# Patient Record
Sex: Female | Born: 1965
Health system: Southern US, Community
[De-identification: ages and names within clinical notes are randomized; demographics above are authoritative.]

## PROBLEM LIST (undated history)

## (undated) DIAGNOSIS — I10 Essential (primary) hypertension: Secondary | ICD-10-CM

## (undated) DIAGNOSIS — Z720 Tobacco use: Secondary | ICD-10-CM

## (undated) DIAGNOSIS — E0500 Thyrotoxicosis with diffuse goiter without thyrotoxic crisis or storm: Secondary | ICD-10-CM

## (undated) DIAGNOSIS — F1410 Cocaine abuse, uncomplicated: Secondary | ICD-10-CM

## (undated) DIAGNOSIS — E0590 Thyrotoxicosis, unspecified without thyrotoxic crisis or storm: Secondary | ICD-10-CM

## (undated) DIAGNOSIS — B86 Scabies: Secondary | ICD-10-CM

## (undated) DIAGNOSIS — E079 Disorder of thyroid, unspecified: Secondary | ICD-10-CM

## (undated) HISTORY — DX: Thyrotoxicosis, unspecified without thyrotoxic crisis or storm: E05.90

## (undated) HISTORY — DX: Tobacco use: Z72.0

## (undated) HISTORY — PX: OTHER SURGICAL HISTORY: SHX169

## (undated) HISTORY — DX: Scabies: B86

## (undated) HISTORY — DX: Cocaine abuse, uncomplicated: F14.10

## (undated) HISTORY — DX: Thyrotoxicosis with diffuse goiter without thyrotoxic crisis or storm: E05.00

## (undated) HISTORY — DX: Essential (primary) hypertension: I10

## (undated) HISTORY — PX: MOUTH SURGERY: SHX715

---

## 2003-12-23 ENCOUNTER — Emergency Department (HOSPITAL_COMMUNITY): Admission: EM | Admit: 2003-12-23 | Discharge: 2003-12-23 | Payer: Self-pay | Admitting: Emergency Medicine

## 2004-01-16 ENCOUNTER — Emergency Department (HOSPITAL_COMMUNITY): Admission: EM | Admit: 2004-01-16 | Discharge: 2004-01-16 | Payer: Self-pay | Admitting: Family Medicine

## 2004-01-30 ENCOUNTER — Emergency Department (HOSPITAL_COMMUNITY): Admission: EM | Admit: 2004-01-30 | Discharge: 2004-01-30 | Payer: Self-pay | Admitting: Family Medicine

## 2004-03-06 ENCOUNTER — Emergency Department (HOSPITAL_COMMUNITY): Admission: EM | Admit: 2004-03-06 | Discharge: 2004-03-06 | Payer: Self-pay | Admitting: Family Medicine

## 2005-02-07 ENCOUNTER — Emergency Department (HOSPITAL_COMMUNITY): Admission: EM | Admit: 2005-02-07 | Discharge: 2005-02-07 | Payer: Self-pay | Admitting: Family Medicine

## 2005-05-19 ENCOUNTER — Emergency Department (HOSPITAL_COMMUNITY): Admission: EM | Admit: 2005-05-19 | Discharge: 2005-05-19 | Payer: Self-pay | Admitting: Emergency Medicine

## 2005-05-21 ENCOUNTER — Ambulatory Visit: Payer: Self-pay | Admitting: Internal Medicine

## 2005-05-21 ENCOUNTER — Observation Stay (HOSPITAL_COMMUNITY): Admission: EM | Admit: 2005-05-21 | Discharge: 2005-05-23 | Payer: Self-pay | Admitting: *Deleted

## 2005-05-28 ENCOUNTER — Ambulatory Visit: Payer: Self-pay | Admitting: Internal Medicine

## 2005-07-04 ENCOUNTER — Emergency Department (HOSPITAL_COMMUNITY): Admission: EM | Admit: 2005-07-04 | Discharge: 2005-07-05 | Payer: Self-pay | Admitting: Emergency Medicine

## 2006-10-14 ENCOUNTER — Emergency Department (HOSPITAL_COMMUNITY): Admission: EM | Admit: 2006-10-14 | Discharge: 2006-10-14 | Payer: Self-pay | Admitting: Emergency Medicine

## 2006-10-27 ENCOUNTER — Emergency Department (HOSPITAL_COMMUNITY): Admission: EM | Admit: 2006-10-27 | Discharge: 2006-10-27 | Payer: Self-pay | Admitting: Emergency Medicine

## 2006-12-16 ENCOUNTER — Emergency Department (HOSPITAL_COMMUNITY): Admission: EM | Admit: 2006-12-16 | Discharge: 2006-12-16 | Payer: Self-pay | Admitting: Emergency Medicine

## 2006-12-18 ENCOUNTER — Ambulatory Visit: Payer: Self-pay | Admitting: Internal Medicine

## 2006-12-18 ENCOUNTER — Encounter (INDEPENDENT_AMBULATORY_CARE_PROVIDER_SITE_OTHER): Payer: Self-pay | Admitting: Internal Medicine

## 2006-12-18 DIAGNOSIS — E0500 Thyrotoxicosis with diffuse goiter without thyrotoxic crisis or storm: Secondary | ICD-10-CM | POA: Insufficient documentation

## 2006-12-19 LAB — CONVERTED CEMR LAB
BUN: 10 mg/dL (ref 6–23)
CO2: 27 meq/L (ref 19–32)
Calcium: 8.9 mg/dL (ref 8.4–10.5)
Chloride: 103 meq/L (ref 96–112)
Creatinine, Ser: 0.43 mg/dL (ref 0.40–1.20)
Free T4: 4.25 ng/dL — ABNORMAL HIGH (ref 0.89–1.80)
Glucose, Bld: 66 mg/dL — ABNORMAL LOW (ref 70–99)
HCT: 38.6 % (ref 36.0–46.0)
Hemoglobin: 12.4 g/dL (ref 12.0–15.0)
MCV: 74.4 fL — ABNORMAL LOW (ref 78.0–100.0)
Platelets: 217 10*3/uL (ref 150–400)
RBC: 5.19 M/uL — ABNORMAL HIGH (ref 3.87–5.11)
TSH: 0.018 microintl units/mL — ABNORMAL LOW (ref 0.350–5.50)
Total Bilirubin: 0.8 mg/dL (ref 0.3–1.2)
WBC: 5 10*3/uL (ref 4.0–10.5)

## 2006-12-29 ENCOUNTER — Emergency Department (HOSPITAL_COMMUNITY): Admission: EM | Admit: 2006-12-29 | Discharge: 2006-12-29 | Payer: Self-pay | Admitting: Family Medicine

## 2007-01-15 ENCOUNTER — Emergency Department (HOSPITAL_COMMUNITY): Admission: EM | Admit: 2007-01-15 | Discharge: 2007-01-15 | Payer: Self-pay | Admitting: Emergency Medicine

## 2007-04-06 ENCOUNTER — Encounter (INDEPENDENT_AMBULATORY_CARE_PROVIDER_SITE_OTHER): Payer: Self-pay | Admitting: *Deleted

## 2007-04-06 ENCOUNTER — Emergency Department (HOSPITAL_COMMUNITY): Admission: EM | Admit: 2007-04-06 | Discharge: 2007-04-06 | Payer: Self-pay | Admitting: *Deleted

## 2007-04-06 ENCOUNTER — Ambulatory Visit: Payer: Self-pay | Admitting: Internal Medicine

## 2007-04-06 LAB — CONVERTED CEMR LAB
Free T4: 3.28 ng/dL — ABNORMAL HIGH (ref 0.89–1.80)
TSH: <0.004 microintl units/mL — ABNORMAL LOW (ref 0.350–5.50)

## 2007-06-09 ENCOUNTER — Emergency Department (HOSPITAL_COMMUNITY): Admission: EM | Admit: 2007-06-09 | Discharge: 2007-06-09 | Payer: Self-pay | Admitting: Family Medicine

## 2007-06-29 ENCOUNTER — Emergency Department (HOSPITAL_COMMUNITY): Admission: EM | Admit: 2007-06-29 | Discharge: 2007-06-29 | Payer: Self-pay | Admitting: Family Medicine

## 2007-07-14 ENCOUNTER — Emergency Department (HOSPITAL_COMMUNITY): Admission: EM | Admit: 2007-07-14 | Discharge: 2007-07-14 | Payer: Self-pay | Admitting: Emergency Medicine

## 2007-07-27 ENCOUNTER — Ambulatory Visit: Payer: Self-pay | Admitting: *Deleted

## 2007-07-27 ENCOUNTER — Encounter: Admission: RE | Admit: 2007-07-27 | Discharge: 2007-07-27 | Payer: Self-pay | Admitting: Infectious Disease

## 2007-07-27 ENCOUNTER — Inpatient Hospital Stay (HOSPITAL_COMMUNITY): Admission: AD | Admit: 2007-07-27 | Discharge: 2007-07-29 | Payer: Self-pay | Admitting: *Deleted

## 2007-07-27 ENCOUNTER — Ambulatory Visit: Payer: Self-pay | Admitting: Hospitalist

## 2007-07-27 DIAGNOSIS — M25519 Pain in unspecified shoulder: Secondary | ICD-10-CM

## 2007-07-28 ENCOUNTER — Encounter (INDEPENDENT_AMBULATORY_CARE_PROVIDER_SITE_OTHER): Payer: Self-pay | Admitting: *Deleted

## 2007-07-29 ENCOUNTER — Encounter: Payer: Self-pay | Admitting: Internal Medicine

## 2007-08-17 ENCOUNTER — Ambulatory Visit: Payer: Self-pay | Admitting: *Deleted

## 2007-08-17 ENCOUNTER — Encounter (INDEPENDENT_AMBULATORY_CARE_PROVIDER_SITE_OTHER): Payer: Self-pay | Admitting: *Deleted

## 2007-08-17 DIAGNOSIS — I10 Essential (primary) hypertension: Secondary | ICD-10-CM | POA: Insufficient documentation

## 2007-08-17 DIAGNOSIS — R079 Chest pain, unspecified: Secondary | ICD-10-CM

## 2007-08-17 DIAGNOSIS — K047 Periapical abscess without sinus: Secondary | ICD-10-CM

## 2007-08-17 DIAGNOSIS — F1410 Cocaine abuse, uncomplicated: Secondary | ICD-10-CM | POA: Insufficient documentation

## 2007-10-12 ENCOUNTER — Encounter: Payer: Self-pay | Admitting: Internal Medicine

## 2007-10-29 ENCOUNTER — Ambulatory Visit: Payer: Self-pay | Admitting: Internal Medicine

## 2007-10-29 ENCOUNTER — Encounter: Payer: Self-pay | Admitting: Internal Medicine

## 2007-10-30 LAB — CONVERTED CEMR LAB
CO2: 22 meq/L (ref 19–32)
Calcium: 9 mg/dL (ref 8.4–10.5)
Creatinine, Ser: 0.55 mg/dL (ref 0.40–1.20)
Sodium: 137 meq/L (ref 135–145)
TSH: 0.006 microintl units/mL — ABNORMAL LOW (ref 0.350–4.50)

## 2007-11-10 ENCOUNTER — Emergency Department (HOSPITAL_COMMUNITY): Admission: EM | Admit: 2007-11-10 | Discharge: 2007-11-10 | Payer: Self-pay | Admitting: Emergency Medicine

## 2007-11-12 ENCOUNTER — Telehealth: Payer: Self-pay | Admitting: Infectious Diseases

## 2008-01-13 ENCOUNTER — Telehealth: Payer: Self-pay | Admitting: Internal Medicine

## 2008-03-11 ENCOUNTER — Ambulatory Visit: Payer: Self-pay | Admitting: Internal Medicine

## 2008-03-11 ENCOUNTER — Encounter: Payer: Self-pay | Admitting: Internal Medicine

## 2008-03-11 ENCOUNTER — Encounter (INDEPENDENT_AMBULATORY_CARE_PROVIDER_SITE_OTHER): Payer: Self-pay | Admitting: Internal Medicine

## 2008-03-11 LAB — CONVERTED CEMR LAB
GC Probe Amp, Genital: NEGATIVE
Gardnerella vaginalis: POSITIVE — AB
Trichomonal Vaginitis: POSITIVE — AB

## 2008-03-12 ENCOUNTER — Encounter: Payer: Self-pay | Admitting: Internal Medicine

## 2008-03-12 LAB — CONVERTED CEMR LAB
ALT: 11 units/L (ref 0–35)
AST: 13 units/L (ref 0–37)
Basophils Absolute: 0 10*3/uL (ref 0.0–0.1)
Basophils Relative: 1 % (ref 0–1)
Chloride: 102 meq/L (ref 96–112)
Creatinine, Ser: 0.62 mg/dL (ref 0.40–1.20)
Eosinophils Relative: 2 % (ref 0–5)
Hemoglobin: 11.8 g/dL — ABNORMAL LOW (ref 12.0–15.0)
Lymphocytes Relative: 27 % (ref 12–46)
MCHC: 32.3 g/dL (ref 30.0–36.0)
Monocytes Absolute: 0.6 10*3/uL (ref 0.1–1.0)
Neutro Abs: 2.4 10*3/uL (ref 1.7–7.7)
Platelets: 294 10*3/uL (ref 150–400)
RDW: 13.9 % (ref 11.5–15.5)
Sodium: 139 meq/L (ref 135–145)
Total Bilirubin: 0.4 mg/dL (ref 0.3–1.2)
Total Protein: 7.2 g/dL (ref 6.0–8.3)

## 2008-03-15 ENCOUNTER — Telehealth: Payer: Self-pay | Admitting: Internal Medicine

## 2008-03-28 ENCOUNTER — Emergency Department (HOSPITAL_COMMUNITY): Admission: EM | Admit: 2008-03-28 | Discharge: 2008-03-28 | Payer: Self-pay | Admitting: Emergency Medicine

## 2008-03-28 ENCOUNTER — Telehealth: Payer: Self-pay | Admitting: *Deleted

## 2008-04-05 ENCOUNTER — Telehealth: Payer: Self-pay | Admitting: *Deleted

## 2008-04-20 ENCOUNTER — Encounter: Payer: Self-pay | Admitting: Internal Medicine

## 2008-04-27 ENCOUNTER — Ambulatory Visit: Payer: Self-pay | Admitting: Obstetrics and Gynecology

## 2008-04-27 ENCOUNTER — Encounter: Payer: Self-pay | Admitting: Internal Medicine

## 2008-04-27 ENCOUNTER — Other Ambulatory Visit: Admission: RE | Admit: 2008-04-27 | Discharge: 2008-04-27 | Payer: Self-pay | Admitting: Obstetrics and Gynecology

## 2008-07-05 ENCOUNTER — Emergency Department (HOSPITAL_COMMUNITY): Admission: EM | Admit: 2008-07-05 | Discharge: 2008-07-05 | Payer: Self-pay | Admitting: Emergency Medicine

## 2008-08-19 ENCOUNTER — Telehealth: Payer: Self-pay | Admitting: Internal Medicine

## 2008-08-27 ENCOUNTER — Emergency Department (HOSPITAL_COMMUNITY): Admission: EM | Admit: 2008-08-27 | Discharge: 2008-08-27 | Payer: Self-pay | Admitting: Emergency Medicine

## 2008-09-09 ENCOUNTER — Telehealth: Payer: Self-pay | Admitting: Internal Medicine

## 2008-10-10 ENCOUNTER — Ambulatory Visit: Payer: Self-pay | Admitting: Internal Medicine

## 2008-10-10 ENCOUNTER — Encounter: Payer: Self-pay | Admitting: Internal Medicine

## 2008-10-10 DIAGNOSIS — F17200 Nicotine dependence, unspecified, uncomplicated: Secondary | ICD-10-CM

## 2008-10-11 LAB — CONVERTED CEMR LAB
ALT: 28 units/L (ref 0–35)
Albumin: 4 g/dL (ref 3.5–5.2)
CO2: 24 meq/L (ref 19–32)
Calcium: 8.6 mg/dL (ref 8.4–10.5)
Chloride: 103 meq/L (ref 96–112)
Cholesterol: 150 mg/dL (ref 0–200)
Free T4: 0.14 ng/dL — ABNORMAL LOW (ref 0.80–1.80)
Glucose, Bld: 78 mg/dL (ref 70–99)
Sodium: 139 meq/L (ref 135–145)
Total Protein: 7.4 g/dL (ref 6.0–8.3)
Triglycerides: 136 mg/dL (ref <150)
VLDL: 27 mg/dL (ref 0–40)

## 2008-10-13 ENCOUNTER — Telehealth: Payer: Self-pay | Admitting: Internal Medicine

## 2008-10-19 ENCOUNTER — Ambulatory Visit: Payer: Self-pay | Admitting: Internal Medicine

## 2008-10-19 ENCOUNTER — Encounter: Payer: Self-pay | Admitting: Internal Medicine

## 2008-10-19 ENCOUNTER — Telehealth: Payer: Self-pay | Admitting: *Deleted

## 2008-10-26 LAB — CONVERTED CEMR LAB
Free T4: 0.23 ng/dL — ABNORMAL LOW (ref 0.80–1.80)
TSH: 21.852 microintl units/mL — ABNORMAL HIGH (ref 0.350–4.500)

## 2008-12-06 ENCOUNTER — Emergency Department (HOSPITAL_COMMUNITY): Admission: EM | Admit: 2008-12-06 | Discharge: 2008-12-06 | Payer: Self-pay | Admitting: Family Medicine

## 2009-01-17 ENCOUNTER — Emergency Department (HOSPITAL_COMMUNITY): Admission: EM | Admit: 2009-01-17 | Discharge: 2009-01-17 | Payer: Self-pay | Admitting: Emergency Medicine

## 2009-01-19 ENCOUNTER — Emergency Department (HOSPITAL_COMMUNITY): Admission: EM | Admit: 2009-01-19 | Discharge: 2009-01-19 | Payer: Self-pay | Admitting: Emergency Medicine

## 2009-03-07 ENCOUNTER — Ambulatory Visit: Payer: Self-pay | Admitting: Internal Medicine

## 2009-03-07 DIAGNOSIS — L299 Pruritus, unspecified: Secondary | ICD-10-CM | POA: Insufficient documentation

## 2009-03-07 LAB — CONVERTED CEMR LAB
Free T4: 1.95 ng/dL — ABNORMAL HIGH (ref 0.80–1.80)
TSH: 0.01 microintl units/mL — ABNORMAL LOW (ref 0.350–4.5)

## 2009-03-10 ENCOUNTER — Encounter: Payer: Self-pay | Admitting: Internal Medicine

## 2009-04-14 ENCOUNTER — Emergency Department (HOSPITAL_COMMUNITY): Admission: EM | Admit: 2009-04-14 | Discharge: 2009-04-14 | Payer: Self-pay | Admitting: Emergency Medicine

## 2009-08-01 ENCOUNTER — Ambulatory Visit: Payer: Self-pay | Admitting: Internal Medicine

## 2009-08-01 DIAGNOSIS — K089 Disorder of teeth and supporting structures, unspecified: Secondary | ICD-10-CM | POA: Insufficient documentation

## 2009-08-03 LAB — CONVERTED CEMR LAB
CO2: 26 meq/L (ref 19–32)
Calcium: 9 mg/dL (ref 8.4–10.5)
Creatinine, Ser: 0.62 mg/dL (ref 0.40–1.20)
TSH: 0.01 microintl units/mL — ABNORMAL LOW (ref 0.350–4.5)

## 2009-08-22 ENCOUNTER — Emergency Department (HOSPITAL_COMMUNITY): Admission: EM | Admit: 2009-08-22 | Discharge: 2009-08-22 | Payer: Self-pay | Admitting: Emergency Medicine

## 2010-01-16 ENCOUNTER — Ambulatory Visit: Payer: Self-pay | Admitting: Internal Medicine

## 2010-01-16 LAB — CONVERTED CEMR LAB
Calcium: 9 mg/dL (ref 8.4–10.5)
Free T4: 1.28 ng/dL (ref 0.80–1.80)
Glucose, Bld: 79 mg/dL (ref 70–99)
Sodium: 138 meq/L (ref 135–145)
TSH: 0.01 microintl units/mL — ABNORMAL LOW (ref 0.350–4.5)

## 2010-02-12 ENCOUNTER — Encounter (HOSPITAL_COMMUNITY)
Admission: RE | Admit: 2010-02-12 | Discharge: 2010-05-01 | Payer: Self-pay | Source: Home / Self Care | Attending: Internal Medicine | Admitting: Internal Medicine

## 2010-03-05 ENCOUNTER — Encounter: Payer: Self-pay | Admitting: Internal Medicine

## 2010-03-14 ENCOUNTER — Ambulatory Visit: Payer: Self-pay | Admitting: Internal Medicine

## 2010-03-20 ENCOUNTER — Telehealth: Payer: Self-pay | Admitting: Internal Medicine

## 2010-04-17 ENCOUNTER — Ambulatory Visit: Admit: 2010-04-17 | Payer: Self-pay

## 2010-04-19 ENCOUNTER — Ambulatory Visit: Admit: 2010-04-19 | Payer: Self-pay | Admitting: Obstetrics and Gynecology

## 2010-04-23 ENCOUNTER — Encounter: Payer: Self-pay | Admitting: Internal Medicine

## 2010-04-30 ENCOUNTER — Ambulatory Visit: Admit: 2010-04-30 | Payer: Self-pay | Admitting: Obstetrics and Gynecology

## 2010-05-02 NOTE — Assessment & Plan Note (Signed)
Summary: est-ck/fu/meds/cfb   Vital Signs:  Patient profile:   45 year old female Height:      71 inches (180.34 cm) Weight:      160.0 pounds (72.73 kg) BMI:     22.40 Temp:     97.5 degrees F (36.39 degrees C) oral Pulse rate:   74 / minute BP sitting:   176 / 90  (right arm)  Vitals Entered By: Stanton Kidney Ditzler RN (Aug 01, 2009 11:06 AM) Is Patient Diabetic? No Pain Assessment Patient in pain? no      Nutritional Status BMI of 19 -24 = normal Nutritional Status Detail appetite good  Have you ever been in a relationship where you felt threatened, hurt or afraid?denies   Does patient need assistance? Functional Status Self care Ambulation Normal Comments Refills on meds - out of meds for about 1 month. Problems with sleep and allergies.   Primary Care Provider:  Hartley Barefoot MD   History of Present Illness: 45 year old with Past Medical History: Hyperthyroidism HTN  She has not been taking her medications. She will go to the dentist next month.  She is not taking her thyroid medication or her BP medication. She is having tooth pain.  She used cocaine 1 week ago.   Depression History:      The patient denies a depressed mood most of the day and a diminished interest in her usual daily activities.         Preventive Screening-Counseling & Management  Alcohol-Tobacco     Alcohol type: BEER / AT TIMES     Smoking Status: current     Smoking Cessation Counseling: yes     Packs/Day: 1 pp week     Year Started: AT THE AGE OF 19 / AT TIMES  Caffeine-Diet-Exercise     Does Patient Exercise: yes     Type of exercise: WALKING     Times/week: 5  Current Medications (verified): 1)  Methimazole 5 Mg Tabs (Methimazole) .... Take 1 Tablet By Mouth Twice Times A Day. 2)  Hydrochlorothiazide 25 Mg  Tabs (Hydrochlorothiazide) .... Take 1 Tablet By Mouth Once A Day  Allergies: 1)  ! Penicillin G Procaine (Penicillin G Procaine)  Social History: Packs/Day:  1 pp  week  Review of Systems  The patient denies fever, chest pain, syncope, dyspnea on exertion, peripheral edema, prolonged cough, hemoptysis, abdominal pain, melena, hematochezia, and severe indigestion/heartburn.    Physical Exam  General:  alert, well-developed, and well-nourished.   Head:  normocephalic and atraumatic.   Mouth:  poor dentition, teeth missing, excessive plaque, and gingival inflammation.   Lungs:  normal respiratory effort, no intercostal retractions, no accessory muscle use, and normal breath sounds.   Heart:  normal rate and regular rhythm.   Abdomen:  soft, non-tender, normal bowel sounds, and no distention.   Extremities:  No edema.    Impression & Recommendations:  Problem # 1:  ESSENTIAL HYPERTENSION, BENIGN (ICD-401.1) Her blood pressure is very high. This could be secondary to not been taking her medications plus pain plus cocaine use. I will give her refill for HCTZ. I will stop coreg due to current cocaine use. I will see her in 1 month and recheck BP. She might need a second medication for BP controlled. She would benefit from betablocker due to thyroid disease but due to current cocaine use I think is better to stop coreg.  The following medications were removed from the medication list:  Coreg 6.25 Mg Tabs (Carvedilol) .Marland Kitchen... Take 1 tablet by mouth two times a day Her updated medication list for this problem includes:    Hydrochlorothiazide 25 Mg Tabs (Hydrochlorothiazide) .Marland Kitchen... Take 1 tablet by mouth once a day  Orders: T-Basic Metabolic Panel [id number])  BP today: 176/90 Prior BP: 159/94 (03/07/2009)  Labs Reviewed: K+: 3.5 (10/10/2008) Creat: : 1.02 (10/10/2008)   Chol: 150 (10/10/2008)   HDL: 65 (10/10/2008)   LDL: 58 (10/10/2008)   TG: 136 (10/10/2008)  Problem # 2:  GRAVE'S DISEASE (ICD-242.00) I will give refill for Methimazole. I will check TSH and free t4 depending on result I will adjust medication. The following medications were  removed from the medication list:    Coreg 6.25 Mg Tabs (Carvedilol) .Marland Kitchen... Take 1 tablet by mouth two times a day Her updated medication list for this problem includes:    Methimazole 5 Mg Tabs (Methimazole) .Marland Kitchen... Take 1 tablet by mouth twice times a day.  Orders: T-TSH [id number]) T-T4, Free [phone])  Problem # 3:  DENTAL PAIN (ICD-525.9) There is not sigh of infection, but she does has very poor dentition. I will prescribe tramadol for pain. She will follow up with dentist next month, as per patient.   Problem # 4:  COCAINE ABUSE (ICD-305.60) Counseling was provied.   Complete Medication List: 1)  Methimazole 5 Mg Tabs (Methimazole) .... Take 1 tablet by mouth twice times a day. 2)  Hydrochlorothiazide 25 Mg Tabs (Hydrochlorothiazide) .... Take 1 tablet by mouth once a day 3)  Tramadol Hcl 50 Mg Tabs (Tramadol hcl) .... Take 1 tablet every 8 hour for pain as needed.  Patient Instructions: 1)  Please schedule a follow-up appointment in 1 month. Prescriptions: METHIMAZOLE 5 MG TABS (METHIMAZOLE) Take 1 tablet by mouth Twice times a day.  #60 x 3   Entered and Authorized by:   Hartley Barefoot MD   Signed by:   Hartley Barefoot MD on 08/01/2009   Method used:   Print then Give to Patient   RxID:   [id number] HYDROCHLOROTHIAZIDE 25 MG  TABS (HYDROCHLOROTHIAZIDE) Take 1 tablet by mouth once a day  #30 x 4   Entered and Authorized by:   Hartley Barefoot MD   Signed by:   Hartley Barefoot MD on 08/01/2009   Method used:   Print then Give to Patient   RxID:   [id number] TRAMADOL HCL 50 MG TABS (TRAMADOL HCL) Take 1 tablet every 8 hour for pain as needed.  #90 x 1   Entered and Authorized by:   Hartley Barefoot MD   Signed by:   Hartley Barefoot MD on 08/01/2009   Method used:   Print then Give to Patient   RxID:   [id number]   Prevention & Chronic Care Immunizations   Influenza vaccine: Not documented   Influenza vaccine deferral: Deferred  (10/10/2008)    Influenza vaccine due: 11/30/2008    Tetanus booster: Not documented   Td booster deferral: Deferred  (10/10/2008)   Tetanus booster due: 10/11/2018    Pneumococcal vaccine: Not documented   Pneumococcal vaccine deferral: Deferred  (10/10/2008)  Other Screening   Pap smear: ATYPICAL SQUAMOUS CELLS OF UNDETERMINED SIGNIFICANCE  (03/11/2008)    Mammogram: Not documented   Mammogram action/deferral: Ordered  (10/10/2008)   Smoking status: current  (08/01/2009)   Smoking cessation counseling: yes  (08/01/2009)  Lipids   Total Cholesterol: 150  (10/10/2008)   Lipid panel action/deferral: Lipid Panel ordered   LDL: 58  (10/10/2008)  LDL Direct: Not documented   HDL: 65  (10/10/2008)   Triglycerides: 136  (10/10/2008)  Hypertension   Last Blood Pressure: 176 / 90  (08/01/2009)   Serum creatinine: 1.02  (10/10/2008)   Serum potassium 3.5  (10/10/2008)  Self-Management Support :   Personal Goals (by the next clinic visit) :      Personal blood pressure goal: 140/90  (10/10/2008)   Patient will work on the following items until the next clinic visit to reach self-care goals:     Medications and monitoring: check my blood pressure, weigh myself weekly  (08/01/2009)     Eating: drink diet soda or water instead of juice or soda, eat more vegetables, use fresh or frozen vegetables, eat foods that are low in salt, eat baked foods instead of fried foods, eat fruit for snacks and desserts  (08/01/2009)     Activity: take a 30 minute walk every day, park at the far end of the parking lot  (08/01/2009)    Hypertension self-management support: Written self-care plan, Education handout, Resources for patients handout  (08/01/2009)   Hypertension self-care plan printed.   Hypertension education handout printed      Resource handout printed.  Process Orders Check Orders Results:     [facility]: ABN not required for this insurance Tests Sent for requisitioning (Aug 01, 2009 2:10 PM):     08/01/2009: [facility] -- T-Basic Metabolic Panel [phone] (signed)     08/01/2009: [facility] -- T-TSH [phone] (signed)     08/01/2009: [facility] -- T-T4, [place] [[id number]] (signed)    Process Orders Check Orders Results:     [facility]: ABN not required for this insurance Tests Sent for requisitioning (Aug 01, 2009 2:10 PM):     08/01/2009: [facility] -- T-Basic Metabolic Panel [phone] (signed)     08/01/2009: [facility] -- T-TSH [phone] (signed)     08/01/2009: [facility] -- [place], [place] [[id number]] (signed)

## 2010-05-02 NOTE — Assessment & Plan Note (Signed)
Summary: NEED MEDICATION/SB.   Vital Signs:  Patient profile:   45 year old female Height:      71 inches Weight:      149.9 pounds BMI:     20.98 Temp:     98.2 degrees F oral Pulse rate:   73 / minute BP sitting:   183 / 102  (right arm)  Vitals Entered By: Filomena Jungling NT II (January 16, 2010 3:02 PM) CC: NEED REFILLS, TEETH PAIN Is Patient Diabetic? No Pain Assessment Patient in pain? yes     Location: TEETH Intensity: 7 Type: aching Onset of pain  Intermittent Nutritional Status BMI of 25 - 29 = overweight  Have you ever been in a relationship where you felt threatened, hurt or afraid?No   Does patient need assistance? Functional Status Self care Ambulation Normal   Primary Care Provider:  Jaci Lazier MD  CC:  NEED REFILLS and TEETH PAIN.  History of Present Illness: 45 y/o W with h/o hyperthyroidism, HTN comes for medicine refill -has not been taking thyroid and blood preassure meds since 2 months - has been having dentla pain and is pending dental assessment. Is going to the camp in November - has been having hot fluses, bursts of excessive anxiety, sweating - no CP, palpitation, SOB etc - drinks 2 cans of beer everyday, smokes marijuana few times a week, smokes 1 pack per week   Current Medications (verified): 1)  Methimazole 5 Mg Tabs (Methimazole) .... Take 1 Tablet By Mouth Twice Times A Day. 2)  Hydrochlorothiazide 25 Mg  Tabs (Hydrochlorothiazide) .... Take 1 Tablet By Mouth Once A Day 3)  Tramadol Hcl 50 Mg Tabs (Tramadol Hcl) .... Take 1 Tablet Every 8 Hour For Pain As Needed.  Allergies (verified): 1)  ! Penicillin G Procaine (Penicillin G Procaine)  Review of Systems  The patient denies anorexia, fever, weight loss, weight gain, vision loss, decreased hearing, hoarseness, chest pain, syncope, dyspnea on exertion, peripheral edema, prolonged cough, headaches, hemoptysis, abdominal pain, melena, hematochezia, severe indigestion/heartburn,  hematuria, incontinence, genital sores, muscle weakness, suspicious skin lesions, transient blindness, difficulty walking, depression, unusual weight change, abnormal bleeding, enlarged lymph nodes, angioedema, and breast masses.    Physical Exam  General:  vitals- rechecked General: Alert, well developed and in no acurte distress ENT: mucous membranes pink & moist. No abnormal finds in ear and nose. CVC:S1 S2 , no murmurs, no abnormal heart sounds. Lungs: Clear to auscultation B/L. No wheezes, crackles or other abnormal sounds Abdomen: soft, non distended, no tender. Normal Bowel sounds EXT: no pitting edema, no engorged veins, Pulsations normal  Neuro:alert, oriented *3, cranial nerved 2-12 intact, strenght normal in all  extremities, senstations normal to light touch.     Impression & Recommendations:  Problem # 1:  DENTAL PAIN (ICD-525.9) no s/s of infection poor dentition  plan - patient to see a dentist during the dental camp in November - tramdol for pain - no Abx today  Problem # 2:  ESSENTIAL HYPERTENSION, BENIGN (ICD-401.1)  uncontrolled- because of hyperthyroidism  plan - Rx HCTZ - add coreg as patient not doing cocaine anymore - BMET - recheck in 2 weeks - endocrine referral for possbile ablation if they accept orange card Her updated medication list for this problem includes:    Hydrochlorothiazide 25 Mg Tabs (Hydrochlorothiazide) .Marland Kitchen... Take 1 tablet by mouth once a day    Metoprolol Succinate 25 Mg Xr24h-tab (Metoprolol succinate) .Marland Kitchen... Take 1 tablet by mouth once a day  BP today: 183/102 Prior BP: 176/90 (08/01/2009)  Labs Reviewed: K+: 3.5 (08/01/2009) Creat: : 0.62 (08/01/2009)   Chol: 150 (10/10/2008)   HDL: 65 (10/10/2008)   LDL: 58 (10/10/2008)   TG: 136 (10/10/2008)  Orders: T-Basic Metabolic Panel ([id number])  Problem # 3:  GRAVE'S DISEASE (ICD-242.00)  s/s of hyperthyroidism. off meds for 2months  plan Rx methimazole check TSH, T4  Her  updated medication list for this problem includes:    Methimazole 5 Mg Tabs (Methimazole) .Marland Kitchen... Take 1 tablet by mouth twice times a day.    Metoprolol Succinate 25 Mg Xr24h-tab (Metoprolol succinate) .Marland Kitchen... Take 1 tablet by mouth once a day  Labs Reviewed: TSH: 0.010 (08/01/2009)     Orders: T-TSH ([id number]) T-T4, Free ([id number]) Endocrinology Referral (Endocrine)  Complete Medication List: 1)  Methimazole 5 Mg Tabs (Methimazole) .... Take 1 tablet by mouth twice times a day. 2)  Hydrochlorothiazide 25 Mg Tabs (Hydrochlorothiazide) .... Take 1 tablet by mouth once a day 3)  Tramadol Hcl 50 Mg Tabs (Tramadol hcl) .... Take 1 tablet every 8 hour for pain as needed. 4)  Metoprolol Succinate 25 Mg Xr24h-tab (Metoprolol succinate) .... Take 1 tablet by mouth once a day  Patient Instructions: 1)  Please schedule a follow-up appointment in 2 weeks. Prescriptions: METHIMAZOLE 5 MG TABS (METHIMAZOLE) Take 1 tablet by mouth Twice times a day.  #60 x 3   Entered and Authorized by:   Bethel Born MD   Signed by:   Bethel Born MD on 01/16/2010   Method used:   Faxed to ...       [facility] Department (retail)       [address] [place], [place]  [address]       Ph: [phone]       Fax: [phone]   RxID:   [id number] HYDROCHLOROTHIAZIDE 25 MG  TABS (HYDROCHLOROTHIAZIDE) Take 1 tablet by mouth once a day  #31 x 3   Entered and Authorized by:   Bethel Born MD   Signed by:   Bethel Born MD on 01/16/2010   Method used:   Faxed to ...       [facility] Department (retail)       [address] [place], [place]  [address]       Ph: [phone]       Fax: [phone]   RxID:   [id number] TRAMADOL HCL 50 MG TABS (TRAMADOL HCL) Take 1 tablet every 8 hour for pain as needed.  #90 x 0   Entered and Authorized by:   Bethel Born MD   Signed by:   Bethel Born MD on 01/16/2010   Method used:   Faxed to ...       [facility] Department (retail)       [address] [place], [place]  [address]       Ph: [phone]       Fax: [phone]   RxID:   [id number] METOPROLOL SUCCINATE 25 MG XR24H-TAB (METOPROLOL SUCCINATE) Take 1 tablet by mouth once a day  #31 x 3   Entered and Authorized by:   Bethel Born MD   Signed by:   Bethel Born MD on 01/16/2010   Method used:   Faxed to ...       [facility] Department (retail)       [address]  [place], [place]  [address]       Ph: [phone]       Fax: [phone]   RxID:   [id number]    Orders Added: 1)  T-Basic Metabolic Panel [phone] 2)  T-TSH [[id number]] 3)  T-T4, Free [[id number]] 4)  Endocrinology Referral [Endocrine] 5)  Est. Patient Level IV [109[address]]    Prevention & Chronic Care Immunizations   Influenza vaccine: Not documented   Influenza vaccine deferral: Deferred  (10/10/2008)   Influenza vaccine due: 11/30/2008    Tetanus booster: Not documented   Td booster deferral: Deferred  (10/10/2008)   Tetanus booster due: 10/11/2018    Pneumococcal vaccine: Not documented   Pneumococcal vaccine deferral: Deferred  (10/10/2008)  Other Screening   Pap smear: ATYPICAL SQUAMOUS CELLS OF UNDETERMINED SIGNIFICANCE  (03/11/2008)    Mammogram: Not documented   Mammogram action/deferral: Ordered  (10/10/2008)   Smoking status: current  (08/01/2009)   Smoking cessation counseling: yes  (08/01/2009)  Lipids   Total Cholesterol: 150  (10/10/2008)   Lipid panel action/deferral: Lipid Panel ordered   LDL: 58  (10/10/2008)   LDL Direct: Not documented   HDL: 65  (10/10/2008)   Triglycerides: 136  (10/10/2008)  Hypertension   Last Blood Pressure: 183 / 102  (01/16/2010)   Serum creatinine: 0.62  (08/01/2009)   Serum potassium 3.5  (08/01/2009)  Self-Management Support :   Personal Goals (by the next clinic visit) :      Personal blood pressure goal: 140/90  (10/10/2008)   Patient will  work on the following items until the next clinic visit to reach self-care goals:     Medications and monitoring: take my medicines every day  (01/16/2010)     Eating: drink diet soda or water instead of juice or soda, eat more vegetables, use fresh or frozen vegetables, eat foods that are low in salt, eat baked foods instead of fried foods, eat fruit for snacks and desserts  (01/16/2010)     Activity: take a 30 minute walk every day, park at the far end of the parking lot  (01/16/2010)    Hypertension self-management support: Education handout  (01/16/2010)   Hypertension education handout printed   Process Orders Check Orders Results:     [facility]: ABN not required for this insurance Tests Sent for requisitioning (January 16, 2010 3:47 PM):     01/16/2010: [facility] -- T-Basic Metabolic Panel [phone] (signed)     01/16/2010: [facility] -- T-TSH [phone] (signed)     01/16/2010: [facility] -- [place], [place] [[id number]] (signed)

## 2010-05-03 NOTE — Progress Notes (Signed)
Summary: Medications  Phone Note Call from Patient   Caller: Patient Call For: Jaci Lazier MD Summary of Call: Call from the pt said that the prescriptions that she received were to expensive.  Wants to get something that is more affordable. Angelina Ok RN  March 20, 2010 2:53 PM  Initial call taken by: Angelina Ok RN,  March 20, 2010 2:53 PM  Follow-up for Phone Call        Not responding well to Permethrin cream for scabies. Will try topical steroids for now, may reduce itching. If not, will have to discuss derm referral, she may need a biopsy if infact this is not scabies.  Follow-up by: Jaci Lazier    New/Updated Medications: TRIAMCINOLONE ACETONIDE 0.1 % CREA (TRIAMCINOLONE ACETONIDE) apply to affected area two to three times daily Prescriptions: TRIAMCINOLONE ACETONIDE 0.1 % CREA (TRIAMCINOLONE ACETONIDE) apply to affected area two to three times daily  #1 x 0   Entered and Authorized by:   Jaci Lazier MD   Signed by:   Jaci Lazier MD on 03/20/2010   Method used:   Print then Give to Patient   RxID:   [id number]

## 2010-05-03 NOTE — Assessment & Plan Note (Signed)
Summary: CHECKUP/SB.   Vital Signs:  Patient profile:   45 year old female Height:      71 inches (180.34 cm) Weight:      159.4 pounds (72.45 kg) BMI:     22.31 Temp:     97.0 degrees F (36.11 degrees C) oral Pulse rate:   76 / minute BP sitting:   182 / 89  (left arm)  Vitals Entered By: Stanton Kidney Ditzler RN (March 14, 2010 1:58 PM) CC: Back Pain Is Patient Diabetic? No Pain Assessment Patient in pain? no      Nutritional Status BMI of 19 -24 = normal Nutritional Status Detail appetite good  Have you ever been in a relationship where you felt threatened, hurt or afraid?denies   Does patient need assistance? Functional Status Self care Ambulation Normal Comments Finish with thyroid by Dr Chestine Spore - FU.   Primary Care Provider:  Jaci Lazier MD  CC:  Back Pain.  History of Present Illness: 45 y/o female w/ h/o Graves disease here on followup. She has been followed by Dr. Chestine Spore of endocrinology, and recently got radioiodine ablation earlier this month. She is currently on Methimazole 5mg . She states that she feels so much better, denies any interval diarrhea, clammy skin, heat intolerance, fever or palpitations.  She also complains of intense pruritus, states that she has had several episodes of scabies in the past and has been treated with both Lindane cream and it appears that she had another episode in 2010 for which she was treated with permethrin cream. She also complains of tooth ache, this has been going on for a while now, and she is scheduled for tooth extraction. Otherwise, no other complaints today.   Depression History:      The patient denies a depressed mood most of the day and a diminished interest in her usual daily activities.         Preventive Screening-Counseling & Management  Alcohol-Tobacco     Alcohol type: BEER / AT TIMES     Smoking Status: current     Smoking Cessation Counseling: yes     Packs/Day: 5 cigs per day     Year Started: AT THE AGE  OF 19 / AT TIMES  Caffeine-Diet-Exercise     Does Patient Exercise: yes     Type of exercise: WALKING     Times/week: 5  Current Problems (verified): 1)  Screening For Malignant Neoplasm of The Cervix  (ICD-V76.2) 2)  Dental Pain  (ICD-525.9) 3)  Pruritus  (ICD-698.9) 4)  Tobacco Abuse  (ICD-305.1) 5)  Abscess, Tooth  (ICD-522.5) 6)  Essential Hypertension, Benign  (ICD-401.1) 7)  Chest Pain Unspecified  (ICD-786.50) 8)  Cocaine Abuse  (ICD-305.60) 9)  Shoulder Pain, Left  (ICD-719.41) 10)  Health Maintenance Exam  (ICD-V70.0) 11)  Grave's Disease  (ICD-242.00)  Current Medications (verified): 1)  Methimazole 5 Mg Tabs (Methimazole) .... Take 1 Tablet By Mouth Twice Times A Day. 2)  Hydrochlorothiazide 25 Mg  Tabs (Hydrochlorothiazide) .... Take 1 Tablet By Mouth Once A Day 3)  Tramadol Hcl 50 Mg Tabs (Tramadol Hcl) .... Take 1 Tablet Every 8 Hour For Pain As Needed. 4)  Metoprolol Succinate 25 Mg Xr24h-Tab (Metoprolol Succinate) .... Take 1 Tablet By Mouth Once A Day  Allergies: 1)  ! Penicillin G Procaine (Penicillin G Procaine)  Past History:  Past Medical History: Last updated: 07/27/2007 Hyperthyroidism HTN  Past Surgical History: Last updated: 07/27/2007 BTL  Social History: Last updated: 07/27/2007 Smokes 1  pack/week x 20 years, smokes marijuna, no cocaine in the last year.  Works for [employer].  Risk Factors: Exercise: yes (03/14/2010)  Risk Factors: Smoking Status: current (03/14/2010) Packs/Day: 5 cigs per day (03/14/2010)  Social History: Packs/Day:  5 cigs per day  Review of Systems      See HPI  Physical Exam  General:  rechecked bp at 185/110. Patient is quite animated, slightly uncomfortable from occassional itching Head:  normocephalic and atraumatic.   Eyes:  vision grossly intact.   Ears:  no external deformities.   Nose:  no external deformity and no nasal discharge.   Mouth:  poor dentition and teeth missing.   Neck:  no  masses, no thyromegaly, and no thyroid nodules or tenderness.   Lungs:  normal respiratory effort, normal breath sounds, no crackles, and no wheezes.   Heart:  normal rate, regular rhythm, no murmur, and no gallop.   Abdomen:  soft, non-tender, and normal bowel sounds.   Pulses:  normal peripheral pulses Extremities:  no edema or cyanosis Neurologic:  alert & oriented X3.   Skin:  color normal, no rashes, and no suspicious lesions.   Psych:  hyperactive.     Impression & Recommendations:  Problem # 1:  GRAVE'S DISEASE (ICD-242.00) According to patient, she is s/p radioiodine ablation on Dec 5, being followed by Dr. Chestine Spore. No thyromegally on exam, asymptomatic, except ? if her increased bp today is 2/2 to hyperthyroidism vs toothache and being uncomfortable from the pruritus. Will need to get records from Dr. Ophelia Charter office to see what his plan is, need to also determine if he has checked TFTs s/p ablation. For now, continue Methimazole and to return to clinic in one month and at that time will recheck her TSH if we are unable to learn anything from endocrinology.  Her updated medication list for this problem includes:    Methimazole 5 Mg Tabs (Methimazole) .Marland Kitchen... Take 1 tablet by mouth twice times a day.    Metoprolol Succinate 25 Mg Xr24h-tab (Metoprolol succinate) .Marland Kitchen... Take 1 tablet by mouth once a day  Problem # 2:  ESSENTIAL HYPERTENSION, BENIGN (ICD-401.1) BP elevated today, unchanged from prior visit. Likely 2/2 to her discomfort from toothache and pruritus. I instructed her to get her bp checked once daily over the next 7 days and if they remain elevated, to call the clinic so we can adjust her meds. She's also to return in one month for bp check.  Her updated medication list for this problem includes:    Hydrochlorothiazide 25 Mg Tabs (Hydrochlorothiazide) .Marland Kitchen... Take 1 tablet by mouth once a day    Metoprolol Succinate 25 Mg Xr24h-tab (Metoprolol succinate) .Marland Kitchen... Take 1 tablet by  mouth once a day  BP today: 182/89 Prior BP: 183/102 (01/16/2010)  Labs Reviewed: K+: 4.1 (01/16/2010) Creat: : 0.66 (01/16/2010)   Chol: 150 (10/10/2008)   HDL: 65 (10/10/2008)   LDL: 58 (10/10/2008)   TG: 136 (10/10/2008)  Problem # 3:  DENTAL PAIN (ICD-525.9) Tooth/gum does not look infected. She tells me that she has been evaluated by a dentist and is waiting to be put on a list for tooth extraction. If on her return visit in one month's time she has been unable to get her tooth extraction done, will have to look into expediting this process for her.  Problem # 4:  PRURITUS (ICD-698.9) ? recurrent scabies. No tracks or rashes on exam. However, given her history, will treat with Permethrin cream. Also  instructed for her partner to be treated as well.  Problem # 5:  HEALTH MAINTENANCE EXAM (ICD-V70.0) To return in one month fasting for FLP, will also check TSH at that time (hopefully should receive Dr. Ophelia Charter office notes by then). Up to date on mammogram, will scan in the records. [facility] Refused flu shot today.  Orders: Gynecologic Referral (Gyn)  Complete Medication List: 1)  Methimazole 5 Mg Tabs (Methimazole) .... Take 1 tablet by mouth twice times a day. 2)  Hydrochlorothiazide 25 Mg Tabs (Hydrochlorothiazide) .... Take 1 tablet by mouth once a day 3)  Tramadol Hcl 50 Mg Tabs (Tramadol hcl) .... Take 1 tablet every 8 hour for pain as needed. 4)  Metoprolol Succinate 25 Mg Xr24h-tab (Metoprolol succinate) .... Take 1 tablet by mouth once a day 5)  Acticin 5 % Crea (Permethrin) .... Apply from chin to toes, leave on for 8-14 hours then rinse. use only once.   Patient Instructions: 1)  Pls make sure to check your blood pressure for the next 7 days. If it continues to remain in the 180s/100s, please call the clinic so we can adjust your blood pressure medicine. 2)  Return to the clinic in one month for blood pressure check and lab  work. Make sure not to eat in the 8 hours prior to your appointment. 3)  Please schedule a follow-up appointment in 1 month. Prescriptions: ACTICIN 5 % CREA (PERMETHRIN) apply from chin to toes, leave on for 8-14 hours then rinse. use only once.  #1 x 0   Entered and Authorized by:   Jaci Lazier MD   Signed by:   Jaci Lazier MD on 03/14/2010   Method used:   Print then Give to Patient   RxID:   [id number] LINDANE 1 % LOTN (LINDANE) apply 30mL from neck to toe, then take a bath 8 to 12 hours later to wash it off. Apply only once.  #1 bottle x 0   Entered and Authorized by:   Jaci Lazier MD   Signed by:   Jaci Lazier MD on 03/14/2010   Method used:   Print then Give to Patient   RxID:   [id number]    Orders Added: 1)  Gynecologic Referral [Gyn]     Prevention & Chronic Care Immunizations   Influenza vaccine: Not documented   Influenza vaccine deferral: Refused  (03/14/2010)   Influenza vaccine due: 11/30/2008    Tetanus booster: Not documented   Td booster deferral: Deferred  (10/10/2008)   Tetanus booster due: 10/11/2018    Pneumococcal vaccine: Not documented   Pneumococcal vaccine deferral: Deferred  (10/10/2008)  Other Screening   Pap smear: ATYPICAL SQUAMOUS CELLS OF UNDETERMINED SIGNIFICANCE  (03/11/2008)   Pap smear action/deferral: GYN Referral  (03/14/2010)    Mammogram: Not documented   Mammogram action/deferral: Ordered  (10/10/2008)  Reports requested:   Last mammogram report requested.  Smoking status: current  (03/14/2010)   Smoking cessation counseling: yes  (03/14/2010)  Lipids   Total Cholesterol: 150  (10/10/2008)   Lipid panel action/deferral: Lipid Panel ordered   LDL: 58  (10/10/2008)   LDL Direct: Not documented   HDL: 65  (10/10/2008)   Triglycerides: 136  (10/10/2008)  Hypertension   Last Blood Pressure: 182 / 89  (03/14/2010)   Serum creatinine: 0.66  (01/16/2010)   Serum potassium 4.1  (01/16/2010)    Hypertension  flowsheet reviewed?: Yes   Progress toward BP goal:  Unchanged  Self-Management Support :   Personal Goals (by the next clinic visit) :      Personal blood pressure goal: 140/90  (10/10/2008)   Patient will work on the following items until the next clinic visit to reach self-care goals:     Medications and monitoring: take my medicines every day, check my blood pressure, bring all of my medications to every visit, weigh myself weekly  (03/14/2010)     Eating: drink diet soda or water instead of juice or soda, eat more vegetables, use fresh or frozen vegetables, eat foods that are low in salt, eat baked foods instead of fried foods, eat fruit for snacks and desserts  (03/14/2010)     Activity: take a 30 minute walk every day, take the stairs instead of the elevator  (03/14/2010)    Hypertension self-management support: Written self-care plan, Education handout, Resources for patients handout  (03/14/2010)   Hypertension self-care plan printed.   Hypertension education handout printed      Resource handout printed.   Nursing Instructions: Gyn referral for screening Pap (see order) Request report of last mammogram   Appended Document: CHECKUP/SB.    Clinical Lists Changes  Orders: Added new Service order of Est. Patient Level III (16109) - Signed

## 2010-05-31 ENCOUNTER — Encounter: Payer: Self-pay | Admitting: Internal Medicine

## 2010-06-11 LAB — HCG, SERUM, QUALITATIVE: Preg, Serum: NEGATIVE

## 2010-07-25 ENCOUNTER — Ambulatory Visit: Payer: Self-pay | Admitting: Internal Medicine

## 2010-08-07 ENCOUNTER — Encounter: Payer: Self-pay | Admitting: Internal Medicine

## 2010-08-14 NOTE — Discharge Summary (Signed)
Ann Jarvis, Ann Jarvis            ACCOUNT NO.:  [id number]   MEDICAL RECORD NO.:  [id number]          PATIENT TYPE:  INP   LOCATION:  4732                         FACILITY:  [facility]   PHYSICIAN:  Manning Charity, MD     DATE OF BIRTH:  1966/01/11   DATE OF ADMISSION:  07/27/2007  DATE OF DISCHARGE:  07/29/2007                               DISCHARGE SUMMARY   CONTINUITY DOCTOR:  Tacey Ruiz, MD   CONSULTANTS:  None.   DISCHARGE DIAGNOSES:  1. Chest pain likely secondary to cocaine use.  2. Uncontrolled hyperthyroidism, causing angina.   OTHER DIAGNOSES:  1. Hyperthyroidism.  2. Hypertension.  3. Tobacco abuse, alcohol abuse, and polysubstance abuse including      marijuana and cocaine.   DISCHARGE MEDICATIONS:  1. Methimazole 10 mg 1 tab twice a day.  2. Coreg 6.25 mg by mouth twice a day.  3. Norvasc 5 mg p.o. daily.   DISPOSITION AND FOLLOWUP:  [name] has an appointment  scheduled at an Outpatient Clinic in [facility] with Dr. Kerby Nora on Aug 03, 2007, at 8:45 a.m.  During that appointment, the result of the 2-D  echo will need to be followed up and depending on result, she might  benefit from a stress test.  She would need also a TSH level in 6 weeks.  She will also need a nuclear medicine thyroid scan and thyroid  radioiodine uptake to follow up Graves disease and to consider thyroid  ablation.  This needs to be discussed with the patient.  She would need  to decide if she is going to continue taking the methimazole or if she  wants iodine radiation.   PROCEDURE PERFORMED:  The 2-D echo result is pending.   BRIEF HISTORY OF PRESENT ILLNESS:  [name] is a 45 year old woman  with past medical history of Graves disease diagnosed in 2007 with a  nuclear scan, not taking methimazole; hypertension; polysubstance abuse  who presents to the Outpatient Clinic complaining of chest pain  radiating to the left arm and neck that started 1 week prior to  admission.  She relates that the pain is 8/10 in intensity,  intermittent, lasting for 15 minutes, alleviated by aspirin and Tylenol.  She relates dyspnea on exertion also.  She is complaining of  palpitation, tremor, and diaphoresis.  She denies diarrhea, fever,  chills, chest pain, numbness, and dysarthria.   PHYSICAL EXAMINATION:  VITAL SIGNS:  Temperature 97.1, blood pressure  133/80, pulse 77, respirations 20, and oxygen saturation 95% on room  air.  GENERAL:  The patient is in no acute distress.  EYES:  Pupils equal and reactive to light, anicteric.  Ear, Nose, and  Throat:  Mucous membranes moist.  CARDIOVASCULAR:  S1 and S2 normal.  Regular rhythm and rate, no JVD, no  rub.  RESPIRATION:  Clear breath sounds, no wheezing, no rales, no rhonchi.  NEURO:  Alert and oriented.  Cranial nerves II through XII intact.  Deep  tendon reflex normal.   LABORATORY DATA:  BMET 136, potassium 3.9, chloride 102, bicarb 28, BUN  6, creatinine 0.39, and  glucose 114.  ANC 3.4, white blood cell 6.1,  hemoglobin 12.5, hematocrit 39, and platelets 231.  Alkaline phosphate  120; elevated by 3 points, bilirubin 0.4, ALT 21, AST 18, protein 7.1,  albumin 3.2, calcium 8.8, troponin 0.02, total CK 34, and CK-MB 0.8.  ESR 6, cholesterol 113, triglyceride 209, HDL 59, LDL 48, and VLDL 6.   HOSPITAL COURSE:  1. Chest pain.  [name] was admitted to telemetry.  She was started      on aspirin and Coreg.  She was positive for cocaine, so we will      consider that her chest pain was likely secondary to cocaine plus      uncontrolled hyperthyroidism, causing  angina.  We ordered EKG,      which showed peak T-wave in V4, V5, and V6.  I repeated EKG, the      peak T-wave decreased.  No ST changes.  Chest x-ray was also      ordered and lungs were clear.  Heart size and pulmonary vascularity      normal, no effusion.  We will start her on Ativan for pain control      and nitroglycerine sublingual for pain  also.  A 2-D echo was      ordered, which result is pending.  D-dimer was negative for PE.      During hospitalization, [name] reports resolution of chest pain,      cardiac enzymes x3 were negative.   1. Hyperthyroidism.  [name] has uncontrolled hyperthyroidism and      she was not taking the methimazole.  She came with tremor, anxiety,      and weight loss.  We restarted on methimazole 8 mg every 8 hours.      TSH was 0.004, free T4 was 4.16, high.  She was not having thyroid      storm because she did not have any fever, no tachycardia, no      delirium.  ESR was ordered because she was having pain on palpation      on the thyroid gland.  We ordered ESR to rule out thyroiditis but      this was negative and was 6.  She will need a repeat scan as an      outpatient.   1. Hypertension, uncontrolled.  She was not taking her medications.      Hypertension could be secondary to cocaine or hyperthyroidism.  We      started her on Coreg and carvedilol.  This needs to be adjusted on      outpatient basis.   1. Shoulder pain.  She was complaining of shoulder pain.  X-ray of the      shoulder was unremarkable and again, this is likely muscle skeletal      pain.   DISCHARGE PHYSICAL EXAM:  GENERAL:  On date of discharge, [name]  was in good condition.  She was less anxious.  VITAL SIGNS:  Blood pressure 113/59, pulse 72, respirations 16, oxygen  saturation 100% on room air, and temperature 99.1.  She was in no acute  distress, pleasant.  CARDIOVASCULAR:  S1 and S2 normal.  Regular rhythm and rate.  PULMONARY:  Clear breath sounds.  No tremors.   CONDITION ON DISCHARGE:  She was discharged in good condition.      Hartley Barefoot, MD  Electronically Signed      Manning Charity, MD  Electronically Signed    BR/MEDQ  D:  07/29/2007  T:  07/30/2007  Job:  [id number]

## 2010-08-17 NOTE — Discharge Summary (Signed)
Ann Jarvis, Ann Jarvis            ACCOUNT NO.:  [id number]   MEDICAL RECORD NO.:  [id number]          PATIENT TYPE:  INP   LOCATION:  3715                         FACILITY:  [facility]   PHYSICIAN:  Sharin Mons, M.D.    DATE OF BIRTH:  1965-04-14   DATE OF ADMISSION:  05/21/2005  DATE OF DISCHARGE:  05/23/2005                                 DISCHARGE SUMMARY   ADDENDUM:  Mrs. Ann Jarvis' discharge medications were changed from  metoprolol to atenolol 100 mg p.o. daily due to financial concerns. This  medication could be provided in samples to the patient.      Sharin Mons, M.D.     WC/MEDQ  D:  05/23/2005  T:  05/24/2005  Job:  [id number]

## 2010-08-17 NOTE — Discharge Summary (Signed)
Ann Jarvis, Ann Jarvis            ACCOUNT NO.:  [id number]   MEDICAL RECORD NO.:  [id number]          PATIENT TYPE:  INP   LOCATION:  3715                         FACILITY:  [facility]   PHYSICIAN:  C. Ulyess Mort, M.D.DATE OF BIRTH:  [birth year]   DATE OF ADMISSION:  05/21/2005  DATE OF DISCHARGE:  05/23/2005                                 DISCHARGE SUMMARY   DISCHARGE DIAGNOSES:  1.  Hyperthyroidism.  2.  Tobacco abuse.  3.  Alcohol abuse.  4.  Marijuana abuse.   DISCHARGE MEDICATIONS:  Metoprolol 100 mg p.o. twice daily.   CONDITION AT DISCHARGE:  Ann Jarvis was admitted with symptoms highly  suggestive of Graves' disease.  She had no evidence of acute thyrotoxic  crisis/storm.  She was admitted in order to perform a 24-hour radio-iodine  uptake and scan.  She was subsequently discharged for followup following the  interpretation of the study.  At the time of hospital followup, she will be  evaluated for treatment of her hyperthyroidism, likely with methimazole.  She is scheduled to see Dr. Marny Lowenstein, [facility],  on May 28, 2005 at 2:30 p.m.   PROCEDURES:  May 22, 2005 to May 28, 2005, a 24-hour radio-iodine  uptake and scan, results pending.   ADMITTING HISTORY AND PHYSICAL:   CHIEF COMPLAINT:  Throat swelling.   HISTORY OF PRESENT ILLNESS:  Ann Jarvis is a 45 year old African American  woman with a past medical history of polysubstance abuse, who presented with  a 2-week history of swollen neck, 1-week history of diarrhea and a 2-day  history of feeling sick, weak, hot with palpitations, hair loss,  dizziness, tremulousness, as well as recent irregular menses and cough.  She  presented to the emergency department out of concern for a swelling neck and  a pressure sensation around her neck with feelings of difficulty breathing  while supine.  She denies fever, confusion, nausea, vomiting, abdominal  pain, jaundice and edema.  No  recent infection, trauma or surgery.   ALLERGIES:  PENICILLIN.   PAST MEDICAL HISTORY:  1.  Tubal ligation 14 years ago.  2.  Polysubstance abuse.   HOME MEDICATIONS:  1.  Tylenol PM p.r.n.  2.  Tylenol Cold p.r.n.  3.  Chloraseptic p.r.n.   SUBSTANCE ABUSE HISTORY:  The patient is a current smoker, a half pack per  day x21 years.  She drinks 6 drinks per day 3 times a week.  She used  cocaine approximately 13 years ago and has been abstinent since, denies IV  drug use, does continue to use marijuana.   SOCIAL HISTORY:  The patient is single, does not have health insurance.  She  lives with her boyfriend in [place].  She has 3 children.   FAMILY MEDICAL HISTORY:  Mother died from seizures secondary to alcohol  abuse.  Father had diabetes.  She has no siblings.   REVIEW OF SYSTEMS:  Review of systems negative, except as per HPI and below.  GENERAL:  Weight loss (approximately 10 pounds over the past 2-3 weeks).  CV:  Palpitations.  RESPIRATORY:  Cough, hemoptysis.  GI:  Diarrhea.  GU:  Last menses May 02, 2005 (irregular).  DERMATOLOGIC:  Hair loss.  NEUROLOGIC:  Tremor and dizziness.   PHYSICAL EXAMINATION:  VITAL SIGNS:  Temperature 98.3, blood pressure  149/86, heart rate 90, respirations 16, oxygen saturation 98% on room air.  GENERAL:  In no acute distress.  Thin.  HEENT:  Proptotic eyes bilaterally.  Mucous membranes pink and moist.  NECK:  Supple with diffuse thyroid enlargement with a positive bruit.  PULMONARY:  Breath sounds clear to auscultation bilaterally, equal excursion  bilaterally.  CV:  S1 and S2, without murmurs, thrills, rubs or gallops.  GI:  Soft, nontender and non-distended.  Active bowel sounds x4.  EXTREMITIES:  No clubbing, edema or cyanosis.  No calf tenderness.  SKIN:  Intact.  MUSCULOSKELETAL:  Moves all extremities well.  NEUROLOGIC:  Alert and oriented x3.  No gross or focal deficits.  PSYCHIATRIC:  Rapid pressured speech, euthymic, mild  psychomotor agitation.   LABORATORY DATA:  Sodium 138, potassium 3.8, chloride 105, bicarb 27, BUN 9,  creatinine 0.5, glucose 105.  White blood cell count 4.7, hemoglobin 11.9,  platelets 219,000, ANC 2.1, MCV 73.9.  Strep screen negative.  TSH 0.01, T4  3.52.   HOSPITAL COURSE:  PROBLEM #1 - HYPERTHYROIDISM:  Ann Jarvis presented with  classic symptoms of Graves' disease.  She was admitted for diagnostic  confirmation.  Her TSH and T4 were consistent with this diagnosis.  She had  a 24-hour radio-iodine uptake and scan during the hospital stay which will  be followed up as an outpatient.  Metoprolol was started at 50 mg p.o. twice  daily and was titrated up to 100 mg p.o. twice daily.  She will be  discharged on this regimen.  Treatment most likely consists of methimazole  x2 years; however, other therapeutic options will also be discussed with the  patient at the time of hospital followup.   PROBLEM #2 - POLYSUBSTANCE ABUSE:  Ann Jarvis has a tobacco, alcohol and  marijuana abuse history.  We had spoken with the patient at length about the  importance of tobacco, alcohol and marijuana cessation, especially with the  known exacerbation of ophthalmologic symptoms with her continued tobacco  abuse.  She has agreed to cease this.  This will be reinforced on hospital  followup.   DISCHARGE LABORATORY DATA AND VITALS:  Temperature 98.3, heart rate 83,  respirations 20, blood pressure 131/88, oxygen saturation 97% on room air.   PENDING STUDIES:  Twenty-four-hour radio-iodine uptake and scan  interpretation and results.      Sharin Mons, M.D.    ______________________________  C. Ulyess Mort, M.D.    WC/MEDQ  D:  05/23/2005  T:  05/24/2005  Job:  [id number]   cc:   Chauncey Reading, D.O.  Fax: [phone]

## 2010-11-09 ENCOUNTER — Emergency Department (HOSPITAL_COMMUNITY)
Admission: EM | Admit: 2010-11-09 | Discharge: 2010-11-10 | Disposition: A | Discharge status: Home / Self Care | Payer: Self-pay | Attending: Emergency Medicine | Admitting: Emergency Medicine

## 2010-11-09 DIAGNOSIS — R059 Cough, unspecified: Secondary | ICD-10-CM | POA: Insufficient documentation

## 2010-11-09 DIAGNOSIS — R070 Pain in throat: Secondary | ICD-10-CM | POA: Insufficient documentation

## 2010-11-09 DIAGNOSIS — I10 Essential (primary) hypertension: Secondary | ICD-10-CM | POA: Insufficient documentation

## 2010-11-09 DIAGNOSIS — J3489 Other specified disorders of nose and nasal sinuses: Secondary | ICD-10-CM | POA: Insufficient documentation

## 2010-11-09 DIAGNOSIS — IMO0001 Reserved for inherently not codable concepts without codable children: Secondary | ICD-10-CM | POA: Insufficient documentation

## 2010-11-09 DIAGNOSIS — R079 Chest pain, unspecified: Secondary | ICD-10-CM | POA: Insufficient documentation

## 2010-11-09 DIAGNOSIS — R05 Cough: Secondary | ICD-10-CM | POA: Insufficient documentation

## 2010-11-09 DIAGNOSIS — R1110 Vomiting, unspecified: Secondary | ICD-10-CM | POA: Insufficient documentation

## 2010-11-09 DIAGNOSIS — M549 Dorsalgia, unspecified: Secondary | ICD-10-CM | POA: Insufficient documentation

## 2010-11-09 DIAGNOSIS — H9209 Otalgia, unspecified ear: Secondary | ICD-10-CM | POA: Insufficient documentation

## 2010-11-09 DIAGNOSIS — J40 Bronchitis, not specified as acute or chronic: Secondary | ICD-10-CM | POA: Insufficient documentation

## 2010-11-09 DIAGNOSIS — R509 Fever, unspecified: Secondary | ICD-10-CM | POA: Insufficient documentation

## 2010-11-09 DIAGNOSIS — E0590 Thyrotoxicosis, unspecified without thyrotoxic crisis or storm: Secondary | ICD-10-CM | POA: Insufficient documentation

## 2010-11-09 DIAGNOSIS — J069 Acute upper respiratory infection, unspecified: Secondary | ICD-10-CM | POA: Insufficient documentation

## 2010-11-09 DIAGNOSIS — R599 Enlarged lymph nodes, unspecified: Secondary | ICD-10-CM | POA: Insufficient documentation

## 2010-11-10 ENCOUNTER — Emergency Department (HOSPITAL_COMMUNITY): Payer: Self-pay

## 2010-11-10 LAB — URINALYSIS, ROUTINE W REFLEX MICROSCOPIC
Glucose, UA: NEGATIVE mg/dL
Protein, ur: NEGATIVE mg/dL
Specific Gravity, Urine: 1.023 (ref 1.005–1.030)

## 2010-11-10 LAB — URINE MICROSCOPIC-ADD ON

## 2010-11-13 ENCOUNTER — Ambulatory Visit (INDEPENDENT_AMBULATORY_CARE_PROVIDER_SITE_OTHER): Payer: Self-pay | Admitting: Internal Medicine

## 2010-11-13 ENCOUNTER — Encounter: Payer: Self-pay | Admitting: Internal Medicine

## 2010-11-13 VITALS — BP 164/86 | HR 84 | Temp 98.1°F | Ht 71.0 in | Wt 165.1 lb

## 2010-11-13 DIAGNOSIS — E0500 Thyrotoxicosis with diffuse goiter without thyrotoxic crisis or storm: Secondary | ICD-10-CM

## 2010-11-13 DIAGNOSIS — I10 Essential (primary) hypertension: Secondary | ICD-10-CM

## 2010-11-13 DIAGNOSIS — J40 Bronchitis, not specified as acute or chronic: Secondary | ICD-10-CM

## 2010-11-13 DIAGNOSIS — J069 Acute upper respiratory infection, unspecified: Secondary | ICD-10-CM | POA: Insufficient documentation

## 2010-11-13 LAB — TSH: TSH: 0.326 u[IU]/mL — ABNORMAL LOW (ref 0.350–4.500)

## 2010-11-13 MED ORDER — DOXYCYCLINE HYCLATE 50 MG PO CAPS: 50.0000 mg | ORAL_CAPSULE | Freq: Two times a day (BID) | ORAL | Status: AC

## 2010-11-13 MED ORDER — HYDROCHLOROTHIAZIDE 25 MG PO TABS: 25.0000 mg | ORAL_TABLET | Freq: Every day | ORAL | Status: DC

## 2010-11-13 MED ORDER — METOPROLOL SUCCINATE ER 25 MG PO TB24: 25.0000 mg | ORAL_TABLET | Freq: Every day | ORAL | Status: DC

## 2010-11-13 NOTE — Assessment & Plan Note (Addendum)
The patient most likely has acute bronchitis.  He had chest x-ray in the urgent care on 11/10/2010 that did not show any acute cardiopulmonary process. He was discharged home on albuterol inhaler which is not helping him much. Will treat him with doxycycline for 5 days. Patient was advised to drink a lot of fluids, steam inhalation and warm saline gargles for his congestion. He was advised to call the clinic if his symptoms doesn't improve. He voices clear understanding.
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
## 2010-11-13 NOTE — Patient Instructions (Signed)
Please take your medicines as prescribed. Please schedule a follow up appointment in 1 month with your PCP. Please drink plenty of fluids, do steam inhalation and warm saline gargles for your congestion.

## 2010-11-13 NOTE — Assessment & Plan Note (Signed)
His blood pressure was running high with the clinic visit. This could be related to his acute illness + he did not take his blood pressure medications (Not been taking his blood pressure medications for a month). His prescriptions were refilled today. Lab Results  Component Value Date   NA 138 01/16/2010   K 4.1 01/16/2010   CL 102 01/16/2010   CO2 26 01/16/2010   BUN 13 01/16/2010   CREATININE 0.66 01/16/2010    BP Readings from Last 3 Encounters:  11/13/10 164/86  03/14/10 182/89  01/16/10 183/102

## 2010-11-13 NOTE — Assessment & Plan Note (Signed)
S/p to iodine ablation in December 2011. We'll check his TSH today.

## 2010-12-25 LAB — T3, FREE: T3, Free: 13.8 — ABNORMAL HIGH (ref 2.3–4.2)

## 2010-12-25 LAB — HEPATIC FUNCTION PANEL
ALT: 13
Alkaline Phosphatase: 117
Bilirubin, Direct: 0.1
Indirect Bilirubin: 0.4

## 2010-12-25 LAB — PROTIME-INR
INR: 1
Prothrombin Time: 13.5

## 2010-12-25 LAB — URINE DRUGS OF ABUSE SCREEN W ALC, ROUTINE (REF LAB)
Amphetamine Screen, Ur: NEGATIVE
Barbiturate Quant, Ur: NEGATIVE
Cocaine Metabolites: POSITIVE — AB
Creatinine,U: 147.1
Methadone: NEGATIVE
Opiate Screen, Urine: NEGATIVE

## 2010-12-25 LAB — DIFFERENTIAL
Basophils Relative: 0
Lymphocytes Relative: 33
Lymphs Abs: 2
Monocytes Absolute: 0.6
Monocytes Relative: 10
Neutro Abs: 3.4
Neutrophils Relative %: 55

## 2010-12-25 LAB — CARDIAC PANEL(CRET KIN+CKTOT+MB+TROPI)
Total CK: 34
Troponin I: 0.02

## 2010-12-25 LAB — COMPREHENSIVE METABOLIC PANEL
Albumin: 3.2 — ABNORMAL LOW
BUN: 6
Calcium: 8.8
Creatinine, Ser: 0.39 — ABNORMAL LOW
Glucose, Bld: 114 — ABNORMAL HIGH
Total Protein: 7.1

## 2010-12-25 LAB — CK TOTAL AND CKMB (NOT AT ARMC)
CK, MB: 0.8
Relative Index: INVALID
Total CK: 34

## 2010-12-25 LAB — D-DIMER, QUANTITATIVE: D-Dimer, Quant: 0.32

## 2010-12-25 LAB — ALKALINE PHOSPHATASE, ISOENZYMES
Alk Phos Bone Fract: 65
Alk Phos: 126 — ABNORMAL HIGH (ref 39–117)

## 2010-12-25 LAB — HEPATITIS PANEL, ACUTE: Hepatitis B Surface Ag: NEGATIVE

## 2010-12-25 LAB — BASIC METABOLIC PANEL
BUN: 4 — ABNORMAL LOW
Creatinine, Ser: 0.38 — ABNORMAL LOW
GFR calc non Af Amer: >60

## 2010-12-25 LAB — LIPASE, BLOOD: Lipase: 31

## 2010-12-25 LAB — LIPID PANEL
LDL Cholesterol: 48
Total CHOL/HDL Ratio: 1.9
Triglycerides: 29
VLDL: 6

## 2010-12-25 LAB — CBC
Hemoglobin: 12.5
MCHC: 32
Platelets: 199
RDW: 13.8
RDW: 14.4

## 2010-12-25 LAB — THC (MARIJUANA), URINE, CONFIRMATION: Marijuana, Ur-Confirmation: 61 ng/mL

## 2010-12-25 LAB — SEDIMENTATION RATE: Sed Rate: 6

## 2010-12-28 LAB — URINALYSIS, ROUTINE W REFLEX MICROSCOPIC
Hgb urine dipstick: NEGATIVE
Nitrite: NEGATIVE
Protein, ur: NEGATIVE
Urobilinogen, UA: 1

## 2010-12-28 LAB — DIFFERENTIAL
Basophils Absolute: 0
Basophils Relative: 1
Eosinophils Absolute: 0.1
Neutrophils Relative %: 65

## 2010-12-28 LAB — POCT I-STAT, CHEM 8
BUN: 6
Calcium, Ion: 1.08 — ABNORMAL LOW
TCO2: 25

## 2010-12-28 LAB — CBC
Hemoglobin: 13.4
MCHC: 32.6
RDW: 14.2

## 2010-12-28 LAB — TSH: TSH: 0.009 — ABNORMAL LOW

## 2010-12-28 LAB — POCT PREGNANCY, URINE: Preg Test, Ur: NEGATIVE

## 2010-12-28 LAB — POCT CARDIAC MARKERS: Myoglobin, poc: 25

## 2011-01-09 LAB — POCT I-STAT CREATININE: Operator id: 272551

## 2011-01-09 LAB — DIFFERENTIAL
Eosinophils Absolute: 0.1
Lymphs Abs: 1.8
Monocytes Relative: 14 — ABNORMAL HIGH
Neutro Abs: 1.9
Neutrophils Relative %: 43

## 2011-01-09 LAB — POCT CARDIAC MARKERS
CKMB, poc: <1 — ABNORMAL LOW
Myoglobin, poc: 32.5
Operator id: 272551
Troponin i, poc: <0.05

## 2011-01-09 LAB — CBC
MCV: 74 — ABNORMAL LOW
RBC: 4.96
WBC: 4.5

## 2011-01-09 LAB — I-STAT 8, (EC8 V) (CONVERTED LAB)
Bicarbonate: 24.3 — ABNORMAL HIGH
Glucose, Bld: 112 — ABNORMAL HIGH
TCO2: 26
pH, Ven: 7.365 — ABNORMAL HIGH

## 2011-01-16 ENCOUNTER — Encounter: Payer: Self-pay | Admitting: Internal Medicine

## 2011-01-16 ENCOUNTER — Ambulatory Visit (INDEPENDENT_AMBULATORY_CARE_PROVIDER_SITE_OTHER): Payer: Self-pay | Admitting: Internal Medicine

## 2011-01-16 VITALS — BP 186/88 | HR 76 | Temp 97.2°F | Ht 71.0 in | Wt 167.2 lb

## 2011-01-16 DIAGNOSIS — I10 Essential (primary) hypertension: Secondary | ICD-10-CM

## 2011-01-16 DIAGNOSIS — B86 Scabies: Secondary | ICD-10-CM | POA: Insufficient documentation

## 2011-01-16 MED ORDER — PERMETHRIN 5 % EX CREA: TOPICAL_CREAM | Freq: Once | CUTANEOUS | Status: AC

## 2011-01-16 NOTE — Patient Instructions (Signed)
Please apply permethrin 5% cream from head to toe including between the fingers and toes and nailbeds once. Keep it for about 8-14 hours and then take a bath. You can use Benadryl over-the-counter as needed for itching. If your symptoms doesn't improve after a week of application of cream, call and make an appointment. Otherwise followup with your regular appointment as needed. Take all other medications regularly.

## 2011-01-16 NOTE — Assessment & Plan Note (Signed)
As described in history of present illness and physical exam., Rash consistent with scabies and patient has likely exposure at the place she was staying with a friend. She moved to do an new apartment now and has washed all the clothes and bed sheets. I will prescribe her permethrin 5% cream to apply once from head to toe and between fingers and toes. Repeat for about 8-14 hours and then wash with a bath. She does remember how to apply it from last time. She will take over-the-counter Benadryl for itching. She will come back after a week if she doesn't get better.

## 2011-01-16 NOTE — Progress Notes (Signed)
  Subjective:    Patient ID: Ann Jarvis, female    DOB: 15-Nov-1965, 45 y.o.   MRN: [id number]  HPI Ann Jarvis is a pleasant 59 year woman with past medical history of Graves' disease, HTN who comes the clinic with chief complaint of rash and itching for about 2 weeks now. She complains of severe itching and rash on her neck, lower back, inner groin and upper inner thigh area, abdomen. There is excoriation everywhere from severe scratching due to itching. Small itchy flat lesions noticed along with itching for 2 weeks.  She did have history of scabies few years back. She was staying with her friend and the apartment was dirty and she think that she got scabies again from there. She denies any fever, chills, nausea vomiting, headache, diarrhea, recent weight loss, chest pain, short of breath, abdominal pain.     Review of Systems    as per history of present illness, all other systems reviewed and negative. Objective:   Physical Exam  Vitals: Reviewed General: Itching intermittently during exam HEENT: PERRL, EOMI, no scleral icterus Cardiac: RRR, no rubs, murmurs or gallops Pulm: clear to auscultation bilaterally, moving normal volumes of air Abd: soft, nontender, nondistended, BS present Ext: warm and well perfused, no pedal edema Neuro: alert and oriented X3, cranial nerves II-XII grossly intact, strength and sensation to light touch equal in bilateral upper and lower extremities Skin: Rash noticed on abdomen bilaterally, anterior neck bilaterally, lower back, inner upper thigh and inner groin area-consistent with scabies,  excoriations due to scratching, no burrows seen with naked eyes. No rash between fingers or toes     Assessment & Plan:

## 2011-01-16 NOTE — Assessment & Plan Note (Signed)
Systolic blood pressure severely elevated, has been the same in the past too.  patient seems anxious to rash and itching, I will not change her medications today. Reassess during next visit and make appropriate changes as needed.

## 2011-02-26 ENCOUNTER — Telehealth: Payer: Self-pay | Admitting: *Deleted

## 2011-02-26 NOTE — Telephone Encounter (Signed)
agree

## 2011-02-26 NOTE — Telephone Encounter (Signed)
Pt calls and states she thinks she has scabies again, appt is given per sharonb. 11/28 at 0945, pt agreeable

## 2011-02-27 ENCOUNTER — Encounter: Payer: Self-pay | Admitting: Internal Medicine

## 2011-02-27 ENCOUNTER — Ambulatory Visit (INDEPENDENT_AMBULATORY_CARE_PROVIDER_SITE_OTHER): Payer: Self-pay | Admitting: Internal Medicine

## 2011-02-27 ENCOUNTER — Ambulatory Visit: Payer: Self-pay | Admitting: Internal Medicine

## 2011-02-27 DIAGNOSIS — E0500 Thyrotoxicosis with diffuse goiter without thyrotoxic crisis or storm: Secondary | ICD-10-CM

## 2011-02-27 DIAGNOSIS — I10 Essential (primary) hypertension: Secondary | ICD-10-CM

## 2011-02-27 DIAGNOSIS — B86 Scabies: Secondary | ICD-10-CM

## 2011-02-27 MED ORDER — METHIMAZOLE 5 MG PO TABS: 5.0000 mg | ORAL_TABLET | Freq: Two times a day (BID) | ORAL | Status: DC

## 2011-02-27 MED ORDER — PERMETHRIN 1 % EX LOTN: TOPICAL_LOTION | CUTANEOUS | Status: DC

## 2011-02-27 MED ORDER — HYDROCHLOROTHIAZIDE 25 MG PO TABS: 25.0000 mg | ORAL_TABLET | Freq: Every day | ORAL | Status: DC

## 2011-02-27 MED ORDER — METOPROLOL SUCCINATE ER 25 MG PO TB24: 25.0000 mg | ORAL_TABLET | Freq: Every day | ORAL | Status: DC

## 2011-02-27 NOTE — Patient Instructions (Addendum)
Today you received refills on your blood pressure and thyroid medications.   We also gave you a prescription to get rid of the scabies. Please apply permethrin 5% cream from head to toe including between the fingers and toes and nailbeds once. Keep it for about 8-14 hours and then take a bath. You can use Benadryl over-the-counter as needed for itching. If your symptoms doesn't improve after a week of application of cream, call and make an appointment. Otherwise followup with your regular appointment as needed. Take all other medications regularly.

## 2011-02-27 NOTE — Assessment & Plan Note (Addendum)
Last TSH 0.326 which is low but substantially improved from prior levels.  AT next f/u with PCP should consider adjusting dose of methimazole if pt has been compliant. Refill given today.

## 2011-02-27 NOTE — Assessment & Plan Note (Signed)
BP 162 sysolic today.  Will continue current management and refill HCTZ and Metoprolol today with further evaluation at next PCP visit.

## 2011-02-27 NOTE — Assessment & Plan Note (Signed)
Repeat infection noted on abdomen with burrows evident with excoriations from the pruritis.  Changed bed and apartment.  Permethrin cream prescribed.

## 2011-02-27 NOTE — Progress Notes (Signed)
  Subjective:    Patient ID: Ann Jarvis, female    DOB: December 13, 1965, 45 y.o.   MRN: [id number]  HPI Reports itching at nighttime and symptoms similar to prior scabies infection.  Reports itching has been going on for about a week but recently got a new bed 3 days ago and moved to a new apartment.  Requesting permethrin cream and refills of thyroid and bp meds. No other complaints.   Review of Systems    as per history of present illness Objective:   Physical Exam  Constitutional: She is oriented to person, place, and time. She appears well-developed and well-nourished. No distress.  HENT:  Head: Normocephalic and atraumatic.  Pulmonary/Chest: Effort normal and breath sounds normal.  Abdominal: Soft. Bowel sounds are normal. There is tenderness.  Neurological: She is alert and oriented to person, place, and time.  Skin: Skin is warm and dry. Rash noted.          Linear burrows on right abdomen with excoriations          Assessment & Plan:  #1 scabies infestation: Will treat with permethrin lotion daily. Patient has already changed mattresses and moved to a new apartment.  #2 Hypertension: BP elevated today at 162/90, patient reports refills needed for her hydrochlorothiazide and metoprolol. Refills printed and given to patient [facility]. Will continue to monitor BP and followup at next visit with PCP.  #3 Graves' disease: Patient without complaints refill methimazole today which was printed and given to the patient who will fill it [facility] pharmacy.

## 2011-03-12 ENCOUNTER — Emergency Department (HOSPITAL_COMMUNITY)
Admission: EM | Admit: 2011-03-12 | Discharge: 2011-03-12 | Discharge status: Against Medical Advice | Payer: Self-pay | Attending: Emergency Medicine | Admitting: Emergency Medicine

## 2011-03-12 ENCOUNTER — Encounter (HOSPITAL_COMMUNITY): Payer: Self-pay

## 2011-03-12 DIAGNOSIS — K089 Disorder of teeth and supporting structures, unspecified: Secondary | ICD-10-CM | POA: Insufficient documentation

## 2011-03-12 HISTORY — DX: Disorder of thyroid, unspecified: E07.9

## 2011-03-12 NOTE — ED Notes (Signed)
Pt c/o dental pain that started this am; requesting abx. Took tylenol and advil pta

## 2011-03-12 NOTE — ED Notes (Signed)
Called in all waiting rooms for pt without answer.

## 2011-05-06 ENCOUNTER — Encounter: Payer: Self-pay | Admitting: Internal Medicine

## 2011-05-10 ENCOUNTER — Ambulatory Visit (INDEPENDENT_AMBULATORY_CARE_PROVIDER_SITE_OTHER): Payer: Self-pay | Admitting: Internal Medicine

## 2011-05-10 ENCOUNTER — Encounter: Payer: Self-pay | Admitting: Internal Medicine

## 2011-05-10 VITALS — BP 174/97 | HR 65 | Temp 97.4°F | Ht 71.0 in | Wt 172.5 lb

## 2011-05-10 DIAGNOSIS — I10 Essential (primary) hypertension: Secondary | ICD-10-CM

## 2011-05-10 DIAGNOSIS — E0500 Thyrotoxicosis with diffuse goiter without thyrotoxic crisis or storm: Secondary | ICD-10-CM

## 2011-05-10 DIAGNOSIS — K047 Periapical abscess without sinus: Secondary | ICD-10-CM

## 2011-05-10 MED ORDER — ACETAMINOPHEN 325 MG PO TABS: 650.0000 mg | ORAL_TABLET | ORAL | Status: DC | PRN

## 2011-05-10 MED ORDER — CLINDAMYCIN HCL 300 MG PO CAPS: 300.0000 mg | ORAL_CAPSULE | Freq: Three times a day (TID) | ORAL | Status: AC

## 2011-05-10 MED ORDER — AMLODIPINE BESYLATE 10 MG PO TABS: 10.0000 mg | ORAL_TABLET | Freq: Every day | ORAL | Status: DC

## 2011-05-10 NOTE — Patient Instructions (Signed)
1. Please take all medications as prescribed.  2. Please do not miss your appointment with Dentist and Endocrinologist for your Grave's disease. 3. If you have worsening of your symptoms or new symptoms arise, please call the clinic ([phone]), or go to the ER immediately if symptoms are severe.

## 2011-05-10 NOTE — Progress Notes (Signed)
Subjective:   Patient ID: Ann Jarvis Jarvis female   DOB: Nov 14, 1965 46 y.o.   MRN: [id number]  HPI: [name] Jarvis is a 46 y.o. with PMH of HTN and Grave's disease, who presents with acute visit for tooth pain. Patient has chronic dental problem with only a few good teeth. Patient reports that her tooth pain has been getting worse recently. She can not chew food normally. She has severe pain particularly in her left lower jaw and feels likely swelling. No ear pain or sore throat.   Regarding her Grave's disease, she reports that she feels hot all the time and sweats a lot. She has irregular menstrual period. No palpitation or loose stool. Does not feel agitated often. Of note, her TSH was 0.326 on 11/13/10.   Regarding her HTN, she is on HCTZ and metoprolol which is also for her Grave's disease. Her Bp is 174/97 today. In fact, her bp was also high on previous visit (SBP 162 on 02/27/11).   Denies fever, chills, fatigue, headaches,  cough, chest pain, SOB,  abdominal pain,diarrhea, dysuria, urgency, frequency, hematuria, joint pain or leg swelling.  Past Medical History  Diagnosis Date  . Hyperthyroidism   . Hypertension   . Tobacco abuse   . Cocaine abuse   . Scabies   . Grave's disease     s/p radioiodine ablation 03/05/10, F/B Dr. Chestine Spore. On Methimazole  . Thyroid condition    Current Outpatient Prescriptions  Medication Sig Dispense Refill  . hydrochlorothiazide (HYDRODIURIL) 25 MG tablet Take 1 tablet (25 mg total) by mouth daily.  30 tablet  6  . methimazole (TAPAZOLE) 5 MG tablet Take 1 tablet (5 mg total) by mouth 2 (two) times daily. 1 tablet twice a day  60 tablet  6  . metoprolol succinate (TOPROL-XL) 25 MG 24 hr tablet Take 1 tablet (25 mg total) by mouth daily.  30 tablet  6  . acetaminophen (TYLENOL) 325 MG tablet Take 2 tablets (650 mg total) by mouth every 4 (four) hours as needed for pain.  100 tablet  2  . amLODipine (NORVASC) 10 MG tablet Take 1 tablet (10 mg  total) by mouth daily.  30 tablet  11   No family history on file. History   Social History  . Marital Status: Single    Spouse Name: N/A    Number of Children: N/A  . Years of Education: N/A   Social History Main Topics  . Smoking status: Current Everyday Smoker -- 0.3 packs/day    Types: Cigarettes  . Smokeless tobacco: None   Comment: 2-3 cigs/day  . Alcohol Use: Yes     2-3 beers/night  . Drug Use: Yes  . Sexually Active: None   Other Topics Concern  . None   Social History Narrative   Smokes 1 pack/week x 20 years, smokes marijuna, no cocaine in the last year.  Works for [employer].Financial assistance approved for 100% discount at [facility] and has [facility] card Rudell Cobb Aug 01 2009 8.58amFinancial assistance approved for 100% discount at [facility] and has [facility] card Gavin Pound February 02, 2010 12.20pm   Review of Systems:  General: no fevers, chills. Skin: no rash HEENT: no blurry vision, hearing changes or sore throat. Has dental pain. Pulm: no dyspnea, coughing, wheezing CV: no chest pain, palpitations, shortness of breath Abd: no nausea/vomiting, abdominal pain, diarrhea/constipation GU: no dysuria, hematuria, polyuria Ext: no arthralgias, myalgias Neuro: no weakness, numbness, or tingling  Objective:  Physical Exam: Filed Vitals:  05/10/11 1436  BP: 174/97  Pulse: 65  Temp: 97.4 F (36.3 C)  TempSrc: Oral  Height: 5\' 11"  (1.803 m)  Weight: 172 lb 8 oz (78.245 kg)  SpO2: 100%   General:  not in acute distress, but in severe pain HEENT: PERRL, EOMI, no scleral icterus.  Dental: patient has very poor teeth. She has only 4 good teeth in her mouth (2 central incisors and 2 lateral incisors look OK). All other teeth has caries. There is dental abscess around her left lower cuspid, first and second premolars.  Cardiac: S1/S2, RRR, No murmurs, gallops or rubs Pulm: Good air movement bilaterally, Clear to auscultation bilaterally, No rales, wheezing, rhonchi or  rubs. Abd: Soft,  nondistended, nontender, no rebound pain, no organomegaly, BS present Ext: No rashes or edema, 2+DP/PT pulse bilaterally Neuro: alert and oriented X3, cranial nerves II-XII grossly intact, muscle strength 5/5 in all extremeties,  sensation to light touch intact.    Assessment & Plan:   # Dental abscess: patient has very poor teeth with dental abscess around her left lower cuspid, first and second premolars. Will give her referral to dentist. Will treat her with 10 days of clindamycin. Patient is taking ibuprofen at home which is partially helpful for pain. Will add tylenol to her regimen for pain.  # Grave's disease: she is on methimazole 5 mg bid. But she is still symptomatic. Her TSH was 0.326 on 11/13/10. Will check her TSH and Free T4. Will give her referral to endocrinology toady. Will follow up.  # HTN: her bp is not controlled well. Bp is 174/97 today. This may be partially caused by her dental pain. But her bp was also high on previous visit (SBP 162 on 02/27/11). It is more likely that her Bp is not well controlled. She is on HCTZ and metoprolol which is also for her Grave's disease.  Will add amlodipine 10 mg daily and follow up.   Lorretta Harp

## 2011-05-10 NOTE — Progress Notes (Signed)
I saw patient and discussed her care with resident Dr. Clyde Lundborg.  I agree with the clinical findings and plans as outlined in his note, with the following additional comments.  Patient appears to have had RAI treatment of Graves in past, with persisting hyperthyroidism.  Agree with plan to refer to endocrinology.

## 2011-05-11 LAB — T4, FREE: Free T4: 0.89 ng/dL (ref 0.80–1.80)

## 2011-05-11 LAB — TSH: TSH: 0.023 u[IU]/mL — ABNORMAL LOW (ref 0.350–4.500)

## 2011-07-11 ENCOUNTER — Encounter: Payer: Self-pay | Admitting: Internal Medicine

## 2011-08-15 ENCOUNTER — Encounter: Payer: Self-pay | Admitting: Ophthalmology

## 2011-08-15 ENCOUNTER — Ambulatory Visit (INDEPENDENT_AMBULATORY_CARE_PROVIDER_SITE_OTHER): Payer: Self-pay | Admitting: Ophthalmology

## 2011-08-15 VITALS — BP 140/70 | HR 70 | Temp 97.8°F | Ht 71.0 in | Wt 164.8 lb

## 2011-08-15 DIAGNOSIS — L299 Pruritus, unspecified: Secondary | ICD-10-CM

## 2011-08-15 DIAGNOSIS — B86 Scabies: Secondary | ICD-10-CM

## 2011-08-15 DIAGNOSIS — E0500 Thyrotoxicosis with diffuse goiter without thyrotoxic crisis or storm: Secondary | ICD-10-CM

## 2011-08-15 DIAGNOSIS — I10 Essential (primary) hypertension: Secondary | ICD-10-CM

## 2011-08-15 MED ORDER — PERMETHRIN 5 % EX CREA: TOPICAL_CREAM | CUTANEOUS | Status: DC

## 2011-08-15 NOTE — Assessment & Plan Note (Signed)
Patient's TSH was supressed at last visit. Remeasured TSH, T4 and T3 since T4 was normal last visit. Should call Dr. Sharl Ma to ask for treatment advice on next visit since she cannot get referral through [facility] since she is uninsured. She had ablation therapy but was not definitively diagnosed with graves since she refused the scan that goes along with radioactive iodine administration. I cannot find results for TSH receptor antibody.

## 2011-08-15 NOTE — Patient Instructions (Signed)
-  Use Zyrtec over the counter once a day to stop the itching.

## 2011-08-15 NOTE — Assessment & Plan Note (Signed)
Patient's BP is borderline on HCTZ and toprol. Informed her that she was supposed to be taking amlodipine as well and that the prescription should be at the pharmacy.

## 2011-08-15 NOTE — Assessment & Plan Note (Deleted)
Patient has history of scabies that was successfully treated with permethrin cream. Prescribed permethrin cream and explained she is to put on all skin avoiding eyes and mouth and rinse off after 12 hours. She should also be told to keep all clothing, sheets and towels in a plastic bag for 3 days or to wash & dry on high heat.

## 2011-08-15 NOTE — Assessment & Plan Note (Signed)
Patient has history of scabies that was successfully treated with permethrin cream. Prescribed permethrin cream and explained she is to put on all skin avoiding eyes and mouth and rinse off after 12 hours. She should also be told to keep all clothing, sheets and towels in a plastic bag for 3 days or to wash & dry on high heat. Asked her to use Zyrtec OTC to control itching.

## 2011-08-15 NOTE — Progress Notes (Signed)
Subjective:   Patient ID: Ann Jarvis female   DOB: [birth year] 46 y.o.   MRN: [id number]  HPI: [name] is a 46 y.o. woman who complains of itching all over her body for about a week. No new soap, new lotion or new detergent etc. About a year ago had NIX and had a cream (pernethrin) that [facility]. Itching is worse at night, feel hot & itchy at the same time. She had the itching everywhere last time. No pets at home. Not sure when the spots appeared- both on wrist and instep of feet.  HTN: is just taking HCTZ and toprol, was started on amlodipine at last visit but did not pick it up. She says last time she was here, she had run out of her meds about a week prior. On recheck BP is 140/70.  Tooth pain: is taking aleeve & goody powder at home for tooth pain, is getting all 4 teeth removed under anesthesia on the 28th. Then will get dentures.   Past Medical History  Diagnosis Date  . Hyperthyroidism   . Hypertension   . Tobacco abuse   . Cocaine abuse   . Scabies   . Grave's disease     s/p radioiodine ablation 03/05/10, F/B Dr. Chestine Spore. On Methimazole  . Thyroid condition    Current Outpatient Prescriptions  Medication Sig Dispense Refill  . acetaminophen (TYLENOL) 325 MG tablet Take 2 tablets (650 mg total) by mouth every 4 (four) hours as needed for pain.  100 tablet  2  . amLODipine (NORVASC) 10 MG tablet Take 1 tablet (10 mg total) by mouth daily.  30 tablet  11  . hydrochlorothiazide (HYDRODIURIL) 25 MG tablet Take 1 tablet (25 mg total) by mouth daily.  30 tablet  6  . methimazole (TAPAZOLE) 5 MG tablet Take 1 tablet (5 mg total) by mouth 2 (two) times daily. 1 tablet twice a day  60 tablet  6  . metoprolol succinate (TOPROL-XL) 25 MG 24 hr tablet Take 1 tablet (25 mg total) by mouth daily.  30 tablet  6   No family history on file. History   Social History  . Marital Status: Single    Spouse Name: N/A    Number of Children: N/A  . Years of  Education: N/A   Social History Main Topics  . Smoking status: Current Everyday Smoker -- 0.3 packs/day    Types: Cigarettes  . Smokeless tobacco: None   Comment: 2-3 cigs/day  . Alcohol Use: Yes     2-3 beers/night  . Drug Use: Yes  . Sexually Active: None   Other Topics Concern  . None   Social History Narrative   Smokes 1 pack/week x 20 years, smokes marijuna, no cocaine in the last year.  Works for [employer].Financial assistance approved for 100% discount at [facility] and has [facility] card Rudell Cobb Aug 01 2009 8.58amFinancial assistance approved for 100% discount at [facility] and has [facility] card Gavin Pound February 02, 2010 12.20pm    Objective:  Physical Exam: Filed Vitals:   08/15/11 1343  BP: 147/86  Pulse: 70  Temp: 97.8 F (36.6 C)  TempSrc: Oral  Height: 5\' 11"  (1.803 m)  Weight: 164 lb 12.8 oz (74.753 kg)   General: middle aged woman with few teeth sitting in chair HEENT: PERRL, EOMI, no scleral icterus Cardiac: RRR, no rubs, murmurs or gallops Pulm: clear to auscultation bilaterally, moving normal volumes of air Ext: warm and well perfused,  no pedal edema Neuro: alert and oriented X3, cranial nerves II-XII grossly intact Skin: patient has few dark slightly raised spots that are pinhead sized at her wrist on on the instep of her foot. No rash on back, stomach, face, legs and arms.  Assessment & Plan:

## 2011-08-16 LAB — COMPREHENSIVE METABOLIC PANEL
ALT: 11 U/L (ref 0–35)
AST: 19 U/L (ref 0–37)
CO2: 24 mEq/L (ref 19–32)
Creat: 0.87 mg/dL (ref 0.50–1.10)
Total Bilirubin: 0.4 mg/dL (ref 0.3–1.2)

## 2011-08-16 LAB — CBC
HCT: 34.2 % — ABNORMAL LOW (ref 36.0–46.0)
Hemoglobin: 10.3 g/dL — ABNORMAL LOW (ref 12.0–15.0)
MCV: 71.8 fL — ABNORMAL LOW (ref 78.0–100.0)
RDW: 17.9 % — ABNORMAL HIGH (ref 11.5–15.5)
WBC: 6.1 10*3/uL (ref 4.0–10.5)

## 2011-08-16 LAB — T4, FREE: Free T4: 0.87 ng/dL (ref 0.80–1.80)

## 2011-08-16 LAB — T3, FREE: T3, Free: 2.8 pg/mL (ref 2.3–4.2)

## 2011-09-21 ENCOUNTER — Encounter (HOSPITAL_COMMUNITY): Payer: Self-pay | Admitting: Emergency Medicine

## 2011-09-21 ENCOUNTER — Emergency Department (HOSPITAL_COMMUNITY): Payer: Self-pay

## 2011-09-21 ENCOUNTER — Emergency Department (HOSPITAL_COMMUNITY)
Admission: EM | Admit: 2011-09-21 | Discharge: 2011-09-21 | Disposition: A | Discharge status: Home / Self Care | Payer: Self-pay | Attending: Emergency Medicine | Admitting: Emergency Medicine

## 2011-09-21 DIAGNOSIS — R29810 Facial weakness: Secondary | ICD-10-CM | POA: Insufficient documentation

## 2011-09-21 DIAGNOSIS — F17200 Nicotine dependence, unspecified, uncomplicated: Secondary | ICD-10-CM | POA: Insufficient documentation

## 2011-09-21 DIAGNOSIS — E039 Hypothyroidism, unspecified: Secondary | ICD-10-CM | POA: Insufficient documentation

## 2011-09-21 DIAGNOSIS — G510 Bell's palsy: Secondary | ICD-10-CM | POA: Insufficient documentation

## 2011-09-21 DIAGNOSIS — Z79899 Other long term (current) drug therapy: Secondary | ICD-10-CM | POA: Insufficient documentation

## 2011-09-21 DIAGNOSIS — I10 Essential (primary) hypertension: Secondary | ICD-10-CM | POA: Insufficient documentation

## 2011-09-21 LAB — DIFFERENTIAL
Basophils Relative: 1 % (ref 0–1)
Eosinophils Absolute: 0.1 10*3/uL (ref 0.0–0.7)
Eosinophils Relative: 1 % (ref 0–5)
Lymphs Abs: 1.3 10*3/uL (ref 0.7–4.0)
Monocytes Absolute: 0.5 10*3/uL (ref 0.1–1.0)
Monocytes Relative: 8 % (ref 3–12)
Neutrophils Relative %: 69 % (ref 43–77)

## 2011-09-21 LAB — CBC
Hemoglobin: 10.2 g/dL — ABNORMAL LOW (ref 12.0–15.0)
MCH: 22.1 pg — ABNORMAL LOW (ref 26.0–34.0)
MCHC: 31.4 g/dL (ref 30.0–36.0)
MCV: 70.3 fL — ABNORMAL LOW (ref 78.0–100.0)
RBC: 4.62 MIL/uL (ref 3.87–5.11)

## 2011-09-21 LAB — URINALYSIS, ROUTINE W REFLEX MICROSCOPIC
Bilirubin Urine: NEGATIVE
Hgb urine dipstick: NEGATIVE
Ketones, ur: NEGATIVE mg/dL
Nitrite: NEGATIVE
Protein, ur: NEGATIVE mg/dL
Specific Gravity, Urine: 1.021 (ref 1.005–1.030)
Urobilinogen, UA: 0.2 mg/dL (ref 0.0–1.0)

## 2011-09-21 LAB — BASIC METABOLIC PANEL
BUN: 9 mg/dL (ref 6–23)
Calcium: 9 mg/dL (ref 8.4–10.5)
Creatinine, Ser: 0.81 mg/dL (ref 0.50–1.10)
GFR calc Af Amer: >90 mL/min (ref >90)
GFR calc non Af Amer: 85 mL/min — ABNORMAL LOW (ref >90)
Glucose, Bld: 82 mg/dL (ref 70–99)
Potassium: 3.5 mEq/L (ref 3.5–5.1)

## 2011-09-21 MED ORDER — HYDROCODONE-ACETAMINOPHEN 5-325 MG PO TABS: 1.0000 | ORAL_TABLET | ORAL | Status: AC | PRN

## 2011-09-21 MED ORDER — ACYCLOVIR 400 MG PO TABS: 800.0000 mg | ORAL_TABLET | Freq: Every day | ORAL | Status: AC

## 2011-09-21 MED ORDER — PREDNISONE 20 MG PO TABS: ORAL_TABLET | ORAL | Status: DC

## 2011-09-21 NOTE — ED Notes (Signed)
Pt. Stated, when I woke this am, my mouth was twisted and my rt. Eye is hard to shur, and my back is hurting.  I was normal when i went to bed.

## 2011-09-21 NOTE — ED Provider Notes (Signed)
History     CSN: [id number]  Arrival date & time 09/21/11  1191   First MD Initiated Contact with Patient 09/21/11 1042      Chief Complaint  Patient presents with  . Facial Droop    (Consider location/radiation/quality/duration/timing/severity/associated sxs/prior treatment) HPI Comments: Pt is a 46 yo woman who noted firth facial weakness this morning.  She was all right yesterday.  She cannot close her right eye and cannot move the right side of her face.  No recent URI or injury.  Patient is a 46 y.o. female presenting with cramps and neurologic complaint.  Abdominal Cramping The primary symptoms of the illness do not include fever.  Symptoms associated with the illness do not include chills. Associated medical issues comments: Hx hypertension, hypothyroidism.Marland Kitchen  Neurologic Problem The primary symptoms include focal weakness. Primary symptoms do not include fever. The symptoms began 2 to 6 hours ago. Progression since onset: Right facial weakness noted today, not changed since onset. The neurological symptoms are focal. Context: No apparent precipitating event.  Associated medical issues comments: Hx hypertension, hypothyroidism..    Past Medical History  Diagnosis Date  . Hyperthyroidism   . Hypertension   . Tobacco abuse   . Cocaine abuse   . Scabies   . Grave's disease     s/p radioiodine ablation 03/05/10, F/B Dr. Chestine Spore. On Methimazole  . Thyroid condition     Past Surgical History  Procedure Date  . Btl     No family history on file.  History  Substance Use Topics  . Smoking status: Current Everyday Smoker -- 0.1 packs/day    Types: Cigarettes  . Smokeless tobacco: Not on file   Comment: 1 cig a day  . Alcohol Use: Yes     2-3 beers/night    OB History    Grav Para Term Preterm Abortions TAB SAB Ect Mult Living                  Review of Systems  Constitutional: Negative.  Negative for fever and chills.  Eyes:       Unable to close right eye.    Respiratory: Negative.   Cardiovascular: Negative.   Gastrointestinal: Negative.   Genitourinary: Negative.   Musculoskeletal: Negative.   Skin: Negative.   Neurological: Positive for focal weakness and facial asymmetry.  Psychiatric/Behavioral: Negative.     Allergies  Penicillins  Home Medications   Current Outpatient Rx  Name Route Sig Dispense Refill  . HYDROCHLOROTHIAZIDE 25 MG PO TABS Oral Take 1 tablet (25 mg total) by mouth daily. 30 tablet 6  . METHIMAZOLE 5 MG PO TABS Oral Take 5 mg by mouth 2 (two) times daily.    Marland Kitchen METOPROLOL SUCCINATE ER 25 MG PO TB24 Oral Take 1 tablet (25 mg total) by mouth daily. 30 tablet 6  . ACYCLOVIR 400 MG PO TABS Oral Take 2 tablets (800 mg total) by mouth 5 (five) times daily. 50 tablet 0  . HYDROCODONE-ACETAMINOPHEN 5-325 MG PO TABS Oral Take 1 tablet by mouth every 4 (four) hours as needed for pain. 12 tablet 0  . PREDNISONE 20 MG PO TABS  Take three tablets per day for two days, then two tablets per day for two days, then one tablet per day for two days. 12 tablet 0    BP 173/114  Pulse 61  Temp 98.5 F (36.9 C) (Oral)  Resp 20  SpO2 100%  LMP 09/14/2011  Physical Exam  Nursing note and vitals  reviewed. Constitutional: She is oriented to person, place, and time. She appears well-developed and well-nourished. No distress.       Obvious right facial nerve palsy involving her entire right side of the face.  HENT:  Head: Normocephalic and atraumatic.  Right Ear: External ear normal.  Left Ear: External ear normal.  Mouth/Throat: Oropharynx is clear and moist.  Eyes: Conjunctivae and EOM are normal. Pupils are equal, round, and reactive to light.       Pt is unable to close her right eye.  Neck: Normal range of motion. Neck supple. Erythema present.       No carotid bruit.  Cardiovascular: Normal rate, regular rhythm and normal heart sounds.   Pulmonary/Chest: Effort normal and breath sounds normal.  Abdominal: Soft. Bowel sounds  are normal.  Musculoskeletal: Normal range of motion. She exhibits no edema and no tenderness.  Neurological: She is alert and oriented to person, place, and time.       Right facial palsy.  No other sensory or motor deficit.  Skin: Skin is warm and dry.  Psychiatric: She has a normal mood and affect. Her behavior is normal.    ED Course  Procedures (including critical care time)  Labs Reviewed  CBC - Abnormal; Notable for the following:    Hemoglobin 10.2 (*)     HCT 32.5 (*)     MCV 70.3 (*)     MCH 22.1 (*)     RDW 16.8 (*)     All other components within normal limits  BASIC METABOLIC PANEL - Abnormal; Notable for the following:    GFR calc non Af Amer 85 (*)     All other components within normal limits  DIFFERENTIAL  URINALYSIS, ROUTINE W REFLEX MICROSCOPIC  PREGNANCY, URINE   Ct Head Wo Contrast  09/21/2011  *RADIOLOGY REPORT*  Clinical Data: Slurred speech  CT HEAD WITHOUT CONTRAST  Technique:  Contiguous axial images were obtained from the base of the skull through the vertex without contrast.  Comparison: None.  Findings: Minimal chronic ischemic changes in the periventricular white matter.  No mass effect, midline shift, or acute intracranial hemorrhage.  Extraaxial space and ventricular system are within normal limits.  Mastoid air cells are clear.  Visualized paranasal sinuses are clear.  IMPRESSION: No acute intracranial pathology.  Original Report Authenticated By: Donavan Burnet, M.D.     1. Bell's palsy     Advised to take acyclovir and prednisone to treat Bell's palsy.  I specifically advised her how to tape her right eye shut at night so that it would not dry out.       Carleene Cooper III, MD 09/21/11 [phone]

## 2011-09-21 NOTE — Discharge Instructions (Signed)
Bell's Palsy  Bell's palsy is a condition in which the muscles on one side of the face cannot move (paralysis). This is because the nerves in the face are paralyzed. It is most often thought to be caused by a virus. The virus causes swelling of the nerve that controls movement on one side of the face. The nerve travels through a tight space surrounded by bone. When the nerve swells, it can be compressed by the bone. This results in damage to the protective covering around the nerve. This damage interferes with how the nerve communicates with the muscles of the face. As a result, it can cause weakness or paralysis of the facial muscles.   Injury (trauma), tumor, and surgery may cause Bell's palsy, but most of the time the cause is unknown. It is a relatively common condition. It starts suddenly (abrupt onset) with the paralysis usually ending within 2 days. Bell's palsy is not dangerous. But because the eye does not close properly, you may need care to keep the eye from getting dry. This can include splinting (to keep the eye shut) or moistening with artificial tears. Bell's palsy very seldom occurs on both sides of the face at the same time.  SYMPTOMS    Eyebrow sagging.   Drooping of the eyelid and corner of the mouth.   Inability to close one eye.   Loss of taste on the front of the tongue.   Sensitivity to loud noises.  TREATMENT   The treatment is usually non-surgical. If the patient is seen within the first 24 to 48 hours, a short course of steroids may be prescribed, in an attempt to shorten the length of the condition. Antiviral medicines may also be used with the steroids, but it is unclear if they are helpful.   You will need to protect your eye, if you cannot close it. The cornea (clear covering over your eye) will become dry and can be damaged. Artificial tears can be used to keep your eye moist. Glasses or an eye patch should be worn to protect your eye.  PROGNOSIS   Recovery is variable, ranging  from days to months. Although the problem usually goes away completely (about 80% of cases resolve), predicting the outcome is impossible. Most people improve within 3 weeks of when the symptoms began. Improvement may continue for 3 to 6 months. A small number of people have moderate to severe weakness that is permanent.   HOME CARE INSTRUCTIONS    If your caregiver prescribed medication to reduce swelling in the nerve, use as directed. Do not stop taking the medication unless directed by your caregiver.   Use moisturizing eye drops as needed to prevent drying of your eye, as directed by your caregiver.   Protect your eye, as directed by your caregiver.   Use facial massage and exercises, as directed by your caregiver.   Perform your normal activities, and get your normal rest.  SEEK IMMEDIATE MEDICAL CARE IF:    There is pain, redness or irritation in the eye.   You or your child has an oral temperature above 102 F (38.9 C), not controlled by medicine.  MAKE SURE YOU:    Understand these instructions.   Will watch your condition.   Will get help right away if you are not doing well or get worse.  Document Released: 03/18/2005 Document Revised: 03/07/2011 Document Reviewed: 03/27/2009  [employer] Patient Information 2012 [employer].

## 2011-10-01 ENCOUNTER — Encounter: Payer: Self-pay | Admitting: Internal Medicine

## 2011-10-09 ENCOUNTER — Other Ambulatory Visit: Payer: Self-pay | Admitting: Internal Medicine

## 2011-10-09 ENCOUNTER — Encounter: Payer: Self-pay | Admitting: Internal Medicine

## 2011-10-09 ENCOUNTER — Ambulatory Visit (INDEPENDENT_AMBULATORY_CARE_PROVIDER_SITE_OTHER): Payer: Self-pay | Admitting: Internal Medicine

## 2011-10-09 VITALS — BP 164/85 | HR 64 | Temp 98.3°F | Ht 71.0 in | Wt 166.6 lb

## 2011-10-09 DIAGNOSIS — E0500 Thyrotoxicosis with diffuse goiter without thyrotoxic crisis or storm: Secondary | ICD-10-CM

## 2011-10-09 DIAGNOSIS — I10 Essential (primary) hypertension: Secondary | ICD-10-CM

## 2011-10-09 DIAGNOSIS — G510 Bell's palsy: Secondary | ICD-10-CM | POA: Insufficient documentation

## 2011-10-09 MED ORDER — METOPROLOL SUCCINATE ER 25 MG PO TB24: 25.0000 mg | ORAL_TABLET | Freq: Every day | ORAL | Status: DC

## 2011-10-09 MED ORDER — TEARS RENEWED OP SOLN: 1.0000 [drp] | Freq: Three times a day (TID) | OPHTHALMIC | Status: DC | PRN

## 2011-10-09 MED ORDER — HYDROCHLOROTHIAZIDE 25 MG PO TABS: 25.0000 mg | ORAL_TABLET | Freq: Every day | ORAL | Status: DC

## 2011-10-09 NOTE — Assessment & Plan Note (Signed)
Patient's blood pressure is not well controlled. It is most likely due to noncompliance. Patient is currently only taking hydrochlorothiazide. She supposed to take 3 medications, including hydrochlorothiazide, metoprolol and amlodipine. The metoprolol is also for her Graves' disease. Today her blood pressure is 164/85. Will restart her metoprolol and followup in clinic in one month. If her blood pressure is still elevated, will add amlodipine back.

## 2011-10-09 NOTE — Addendum Note (Signed)
Addended by: Lorretta Harp on: 10/09/2011 05:22 PM   Modules accepted: Orders

## 2011-10-09 NOTE — Assessment & Plan Note (Signed)
Patient is currently taking methimazole. Her last TSH and free T4 were normal on 08/15/11. She does not have symptoms for hypothyroidism. Will continue current regimen and follow up

## 2011-10-09 NOTE — Addendum Note (Signed)
Addended by: Lorretta Harp on: 10/09/2011 05:17 PM   Modules accepted: Orders

## 2011-10-09 NOTE — Progress Notes (Signed)
Patient ID: Ann Jarvis, female   DOB: Jun 19, 1965, 46 y.o.   MRN: [id number]  Subjective:   Patient ID: Ann Jarvis female   DOB: Nov 01, 1965 46 y.o.   MRN: [id number]  HPI: [name] is a 46 y.o. with past medical history as outlined below, who presents for for a hospital followup visit following her recent visit to ED due to the Bell's palsy.   Patient visit ED on 6/22 due to right side facial weakness this morning. She could not close her right eye or move the right side of her face. She had negative CT-head in ED. she was diagnosed as Bell's palsy in ED. She was treated with acyclovir for 5 days and prednisone for 6 days. Today patient reports her facial weakness improved significantly, though she still cannot close her right eye completely. She is happy with this improvement.   Regarding her hypertension, patient ran out of her metoprolol medication. She is only taking hydrochlorothiazide currently. Today her blood pressure is 164/85.  Regarding her Graves' disease, patient is on methimazole. Her recent TSH and free T4 were normal on 08/15/11.   Past Medical History  Diagnosis Date  . Hyperthyroidism   . Hypertension   . Tobacco abuse   . Cocaine abuse   . Scabies   . Grave's disease     s/p radioiodine ablation 03/05/10, F/B Dr. Chestine Spore. On Methimazole  . Thyroid condition    Current Outpatient Prescriptions  Medication Sig Dispense Refill  . hydrochlorothiazide (HYDRODIURIL) 25 MG tablet Take 1 tablet (25 mg total) by mouth daily.  30 tablet  5  . methimazole (TAPAZOLE) 5 MG tablet Take 5 mg by mouth 2 (two) times daily.      . metoprolol succinate (TOPROL-XL) 25 MG 24 hr tablet Take 1 tablet (25 mg total) by mouth daily.  30 tablet  6  . predniSONE (DELTASONE) 20 MG tablet Take three tablets per day for two days, then two tablets per day for two days, then one tablet per day for two days.  12 tablet  0  . DISCONTD: hydrochlorothiazide (HYDRODIURIL) 25 MG tablet  Take 1 tablet (25 mg total) by mouth daily.  30 tablet  6  . DISCONTD: metoprolol succinate (TOPROL-XL) 25 MG 24 hr tablet Take 1 tablet (25 mg total) by mouth daily.  30 tablet  6   No family history on file. History   Social History  . Marital Status: Single    Spouse Name: N/A    Number of Children: N/A  . Years of Education: N/A   Social History Main Topics  . Smoking status: Current Everyday Smoker -- 0.1 packs/day    Types: Cigarettes  . Smokeless tobacco: Not on file   Comment: 1 cig a day  . Alcohol Use: Yes     2-3 beers/night  . Drug Use: Yes     marijuana  . Sexually Active: Not on file   Other Topics Concern  . Not on file   Social History Narrative   Smokes 1 pack/week x 20 years, smokes marijuna, no cocaine in the last year.  Works for [employer].Financial assistance approved for 100% discount at [facility] and has [facility] H[employer] card Rudell Cobb Aug 01 2009 8.58amFinancial assistance approved for 100% discount at [facility] and has [facility] card Gavin Pound February 02, 2010 12.20pm   Review of Systems:  General: no fevers, chills, no changes in body weight, no changes in appetite Skin: no rash HEENT: no blurry vision, hearing changes or  sore throat. Positive for right facial weakness and facial asymmetry. Pulm: no dyspnea, coughing, wheezing CV: no chest pain, palpitations, shortness of breath Abd: no nausea/vomiting, abdominal pain, diarrhea/constipation GU: no dysuria, hematuria, polyuria Ext: no arthralgias, myalgias Neuro: no weakness, numbness, or tingling   Objective:  Physical Exam: Filed Vitals:   10/09/11 1116  BP: 164/85  Pulse: 64  Temp: 98.3 F (36.8 C)  TempSrc: Oral  Height: 5\' 11"  (1.803 m)  Weight: 166 lb 9.6 oz (75.569 kg)  SpO2: 99%   General: resting in bed, not in acute distress HEENT: PERRL, no scleral icterus. Has right facial weakness. Can not close her right eye completely. Cardiac: S1/S2, RRR, No murmurs, gallops or rubs Pulm: Good air  movement bilaterally, Clear to auscultation bilaterally, No rales, wheezing, rhonchi or rubs. Abd: Soft,  nondistended, nontender, no rebound pain, no organomegaly, BS present Ext: No rashes or edema, 2+DP/PT pulse bilaterally Musculoskeletal: No joint deformities, erythema, or stiffness, ROM full and no nontender Skin: no rashes. No skin bruise. Neuro: alert and oriented X3, cranial nerves II-XII grossly intact, muscle strength 5/5 in all extremeties,  sensation to light touch intact.  Psych.: patient is not psychotic, no suicidal or hemocidal ideation.    Assessment & Plan:

## 2011-10-09 NOTE — Patient Instructions (Signed)
1. It is very important to take your medications regularly for your good health. 2. Please take all medications as prescribed.  3. If you have worsening of your symptoms or new symptoms arise, please call the clinic ([phone]), or go to the ER immediately if symptoms are severe.

## 2011-10-09 NOTE — Assessment & Plan Note (Signed)
Patient completed the treatment with acyclovir and prednisone. Her symptoms improved significantly. Patient is happy with this improvement. Patient is instructed to continue the artificial tear to protect her cornea. Will followup patient in my mouth.

## 2011-10-31 ENCOUNTER — Encounter (HOSPITAL_COMMUNITY): Payer: Self-pay | Admitting: Emergency Medicine

## 2011-10-31 ENCOUNTER — Emergency Department (HOSPITAL_COMMUNITY)
Admission: EM | Admit: 2011-10-31 | Discharge: 2011-10-31 | Disposition: A | Discharge status: Home / Self Care | Payer: Self-pay | Attending: Emergency Medicine | Admitting: Emergency Medicine

## 2011-10-31 DIAGNOSIS — F17200 Nicotine dependence, unspecified, uncomplicated: Secondary | ICD-10-CM | POA: Insufficient documentation

## 2011-10-31 DIAGNOSIS — I10 Essential (primary) hypertension: Secondary | ICD-10-CM | POA: Insufficient documentation

## 2011-10-31 DIAGNOSIS — H60399 Other infective otitis externa, unspecified ear: Secondary | ICD-10-CM | POA: Insufficient documentation

## 2011-10-31 DIAGNOSIS — H6090 Unspecified otitis externa, unspecified ear: Secondary | ICD-10-CM

## 2011-10-31 DIAGNOSIS — E079 Disorder of thyroid, unspecified: Secondary | ICD-10-CM | POA: Insufficient documentation

## 2011-10-31 MED ORDER — IBUPROFEN 600 MG PO TABS: 600.0000 mg | ORAL_TABLET | Freq: Four times a day (QID) | ORAL | Status: AC | PRN

## 2011-10-31 MED ORDER — CIPROFLOXACIN-DEXAMETHASONE 0.3-0.1 % OT SUSP: 4.0000 [drp] | Freq: Two times a day (BID) | OTIC | Status: AC

## 2011-10-31 NOTE — ED Notes (Signed)
Pt c/o left ear pain onset 2 days ago. Pt reports using q-tip recently and may have aggrevated her ear. Pt denies any drainage, reports difficulty hearing out of left ear.

## 2011-10-31 NOTE — ED Provider Notes (Signed)
History   This chart was scribed for Ann Chick, MD by Shari Heritage. The patient was seen in room TR05C/TR05C. Patient's care was started at 0956.     CSN: [id number]  Arrival date & time 10/31/11  9147   First MD Initiated Contact with Patient 10/31/11 1106      Chief Complaint  Patient presents with  . Otalgia    (Consider location/radiation/quality/duration/timing/severity/associated sxs/prior treatment) Patient is a 46 y.o. female presenting with ear pain. The history is provided by the patient. No language interpreter was used.  Otalgia This is a new problem. The current episode started 2 days ago. There is pain in the left ear. The problem occurs constantly. The problem has not changed since onset.There has been no fever. The pain is at a severity of 7/10. The pain is moderate. Pertinent negatives include no ear discharge. Her past medical history does not include chronic ear infection, hearing loss or tympanostomy tube.   Ann Jarvis is a 46 y.o. female who presents to the Emergency Department complaining of persistent, moderate to severe, left-sided otalgia onset 2 days ago. Patient states that her ear has been stopped up and has caused minor changes in her hearing. Patient hasn't noticed any drainage from her left ear. Patient also reports a small bump on the inside of her ear. She has been using Q-tips a lot recently and thinks that she may have aggravated her ear.Patient denies any other pain or symptoms. Patient has a medical history of hyperthyroidism, HTN, tobacco and cocaine abuse, scabies and Grave's disease. Patient is a current everyday smoker.   Past Medical History  Diagnosis Date  . Hyperthyroidism   . Hypertension   . Tobacco abuse   . Cocaine abuse   . Scabies   . Grave's disease     s/p radioiodine ablation 03/05/10, F/B Dr. Chestine Spore. On Methimazole  . Thyroid condition     Past Surgical History  Procedure Date  . Btl   . Mouth surgery     No  family history on file.  History  Substance Use Topics  . Smoking status: Current Everyday Smoker -- 0.1 packs/day    Types: Cigarettes  . Smokeless tobacco: Not on file   Comment: 1 cig a day  . Alcohol Use: Yes     2-3 beers/night    OB History    Grav Para Term Preterm Abortions TAB SAB Ect Mult Living                  Review of Systems  HENT: Positive for ear pain. Negative for ear discharge.   All other systems reviewed and are negative.    Allergies  Penicillins  Home Medications   Current Outpatient Rx  Name Route Sig Dispense Refill  . HYDROCHLOROTHIAZIDE 25 MG PO TABS Oral Take 1 tablet (25 mg total) by mouth daily. 30 tablet 5  . METHIMAZOLE 5 MG PO TABS Oral Take 5 mg by mouth 2 (two) times daily.    Marland Kitchen METOPROLOL SUCCINATE ER 25 MG PO TB24 Oral Take 1 tablet (25 mg total) by mouth daily. 30 tablet 6  . CIPROFLOXACIN-DEXAMETHASONE 0.3-0.1 % OT SUSP Left Ear Place 4 drops into the left ear 2 (two) times daily. 7.5 mL 0  . IBUPROFEN 600 MG PO TABS Oral Take 1 tablet (600 mg total) by mouth every 6 (six) hours as needed for pain. 30 tablet 0    BP 165/90  Pulse 68  Temp 98.4 F (36.9  C) (Oral)  Resp 16  SpO2 100%  LMP 10/02/2011  Physical Exam  Nursing note and vitals reviewed. Constitutional: She is oriented to person, place, and time. She appears well-developed and well-nourished. No distress.  HENT:  Head: Normocephalic and atraumatic.  Left Ear: Tympanic membrane normal.       Mild edema and erythema of her left external externl auricular canal. 2-3 mm pre-auricular tender lymph node.  Eyes: EOM are normal. Pupils are equal, round, and reactive to light.  Neck: Neck supple. No tracheal deviation present.  Cardiovascular: Normal rate.   Pulmonary/Chest: Effort normal. No respiratory distress.  Abdominal: Soft. She exhibits no distension.  Musculoskeletal: Normal range of motion. She exhibits no edema.  Neurological: She is alert and oriented to  person, place, and time. No sensory deficit.  Skin: Skin is warm and dry.  Psychiatric: She has a normal mood and affect. Her behavior is normal.    ED Course  Procedures (including critical care time) DIAGNOSTIC STUDIES: Oxygen Saturation is 100% on room air, normal by my interpretation.    COORDINATION OF CARE: 11:14am- Patient informed of current plan for treatment and evaluation and agrees with plan at this time. Will discharge patient home with prescription for Ibuprofen 600mg  and Ciprodex.    Labs Reviewed - No data to display No results found.   1. Otitis external       MDM  Pt presenting with left sided ear pain.  No OM, will treat with abx ear drops for OE.  Also given ibuprofen for pain.  Discharged with strict return precautions, she is agreeable with this plan.       I personally performed the services described in this documentation, which was scribed in my presence. The recorded information has been reviewed and considered.    Ann Chick, MD 10/31/11 [phone]

## 2011-10-31 NOTE — ED Notes (Signed)
Pt a x 4 at discharge. Ambulated without difficulty. No belongings left in room. Pt to get rx filled.

## 2011-10-31 NOTE — ED Notes (Addendum)
Pt c/o left ear pain x3 days.  Pt reports a bump on the inside of her ear. Pt reports a "sore" pain 7/10.  Pt reports reduced hearing.

## 2011-12-17 ENCOUNTER — Other Ambulatory Visit: Payer: Self-pay | Admitting: Internal Medicine

## 2011-12-17 DIAGNOSIS — E0500 Thyrotoxicosis with diffuse goiter without thyrotoxic crisis or storm: Secondary | ICD-10-CM

## 2012-01-04 ENCOUNTER — Encounter (HOSPITAL_COMMUNITY): Payer: Self-pay | Admitting: Emergency Medicine

## 2012-01-04 ENCOUNTER — Emergency Department (HOSPITAL_COMMUNITY)
Admission: EM | Admit: 2012-01-04 | Discharge: 2012-01-04 | Disposition: A | Discharge status: Home / Self Care | Payer: Self-pay | Attending: Emergency Medicine | Admitting: Emergency Medicine

## 2012-01-04 DIAGNOSIS — M719 Bursopathy, unspecified: Secondary | ICD-10-CM | POA: Insufficient documentation

## 2012-01-04 DIAGNOSIS — M67919 Unspecified disorder of synovium and tendon, unspecified shoulder: Secondary | ICD-10-CM | POA: Insufficient documentation

## 2012-01-04 DIAGNOSIS — F17200 Nicotine dependence, unspecified, uncomplicated: Secondary | ICD-10-CM | POA: Insufficient documentation

## 2012-01-04 DIAGNOSIS — I10 Essential (primary) hypertension: Secondary | ICD-10-CM | POA: Insufficient documentation

## 2012-01-04 DIAGNOSIS — F1010 Alcohol abuse, uncomplicated: Secondary | ICD-10-CM | POA: Insufficient documentation

## 2012-01-04 DIAGNOSIS — Z880 Allergy status to penicillin: Secondary | ICD-10-CM | POA: Insufficient documentation

## 2012-01-04 NOTE — ED Provider Notes (Signed)
History   This chart was scribed for Ann Horn, MD by Melba Coon. The patient was seen in room TR07C/TR07C and the patient's care was started at 11:58AM.    CSN: [id number]  Arrival date & time 01/04/12  1015   None     Chief Complaint  Patient presents with  . Arm Pain    left arm    (Consider location/radiation/quality/duration/timing/severity/associated sxs/prior treatment) The history is provided by the patient. No language interpreter was used.   Ann Jarvis is a 46 y.o. female who presents to the Emergency Department complaining of constant, moderate, aching, throbbing, left arm and shoulder pain with an onset 4 days ago. No trauma or injury to the arm. Reports ROM of left arm is slightly decreased with pain. No HA, fever, neck pain, sore throat, rash, back pain, CP, SOB, abd pain, n/v/d, dysuria, or extremity edema, weakness, numbness, or tingling. No other pertinent medical symptoms.  Past Medical History  Diagnosis Date  . Hyperthyroidism   . Hypertension   . Tobacco abuse   . Cocaine abuse   . Scabies   . Grave's disease     s/p radioiodine ablation 03/05/10, F/B Dr. Chestine Spore. On Methimazole  . Thyroid condition     Past Surgical History  Procedure Date  . Btl   . Mouth surgery     No family history on file.  History  Substance Use Topics  . Smoking status: Current Every Day Smoker -- 0.1 packs/day    Types: Cigarettes  . Smokeless tobacco: Not on file   Comment: 1 cig a day  . Alcohol Use: Yes     2-3 beers/night    OB History    Grav Para Term Preterm Abortions TAB SAB Ect Mult Living                  Review of Systems 10 Systems reviewed and all are negative for acute change except as noted in the HPI.   Allergies  Penicillins  Home Medications   Current Outpatient Rx  Name Route Sig Dispense Refill  . HYDROCHLOROTHIAZIDE 25 MG PO TABS Oral Take 1 tablet (25 mg total) by mouth daily. 30 tablet 5  . METHIMAZOLE 5 MG PO TABS  Oral Take 5 mg by mouth 2 (two) times daily.    Marland Kitchen METOPROLOL SUCCINATE ER 25 MG PO TB24 Oral Take 1 tablet (25 mg total) by mouth daily. 30 tablet 6    BP 154/89  Pulse 77  Temp 98.5 F (36.9 C) (Oral)  Resp 16  SpO2 98%  LMP 12/23/2011  Physical Exam  Nursing note and vitals reviewed. Constitutional:       Awake, alert, nontoxic appearance.  HENT:  Head: Atraumatic.  Eyes: Right eye exhibits no discharge. Left eye exhibits no discharge.  Neck: Neck supple.       Left midline neck is non tender.  Pulmonary/Chest: Effort normal. She exhibits no tenderness.  Abdominal: Soft. There is no tenderness. There is no rebound.  Musculoskeletal:       Left hand has intact light touch and motor distribution in median, radial, and ulnar nerve function with CR less than 2 sec. Hand, wrist, and forearm non tender. Left anterior shoulder has point tenderness over biceps tendon insertion with AC joint non tender. Lateral deltoid and posterior shoulder non tender. Negative shoulder drop test. Back, bilateral, lower extremities,and RUE is non tender.  Neurological:       Mental status and motor strength appears  baseline for patient and situation.  Skin: No rash noted.  Psychiatric: She has a normal mood and affect.    ED Course  Procedures (including critical care time)  COORDINATION OF CARE:  12:03PM - she denies narcotic drugs today. She is advised to take tylenol and motrin to take at home and is ready for d/c.   Labs Reviewed - No data to display No results found.   1. Tendinitis of shoulder       MDM   Patient / Family / Caregiver informed of clinical course, understand medical decision-making process, and agree with plan. I personally performed the services described in this documentation, which was scribed in my presence. The recorded information has been reviewed and considered. I doubt any other EMC precluding discharge at this time.     Ann Horn, MD 01/06/12  2312

## 2012-01-04 NOTE — ED Notes (Signed)
Pt c/o left arm/shoulder pain x 4 days. Pt denies recent injury. Pt able to move arm but with pain.

## 2012-02-18 ENCOUNTER — Encounter: Payer: Self-pay | Admitting: Internal Medicine

## 2012-02-18 NOTE — Progress Notes (Signed)
This encounter was created in error - please disregard.  This encounter was created in error - please disregard.

## 2012-03-09 NOTE — Addendum Note (Signed)
Addended by: Neomia Dear on: 03/09/2012 05:53 PM   Modules accepted: Orders

## 2012-04-10 ENCOUNTER — Encounter (HOSPITAL_COMMUNITY): Payer: Self-pay | Admitting: Emergency Medicine

## 2012-04-10 ENCOUNTER — Emergency Department (HOSPITAL_COMMUNITY)
Admission: EM | Admit: 2012-04-10 | Discharge: 2012-04-11 | Disposition: A | Discharge status: Home / Self Care | Payer: Self-pay | Attending: Emergency Medicine | Admitting: Emergency Medicine

## 2012-04-10 ENCOUNTER — Emergency Department (HOSPITAL_COMMUNITY): Payer: Self-pay

## 2012-04-10 DIAGNOSIS — I10 Essential (primary) hypertension: Secondary | ICD-10-CM | POA: Insufficient documentation

## 2012-04-10 DIAGNOSIS — Z862 Personal history of diseases of the blood and blood-forming organs and certain disorders involving the immune mechanism: Secondary | ICD-10-CM | POA: Insufficient documentation

## 2012-04-10 DIAGNOSIS — J039 Acute tonsillitis, unspecified: Secondary | ICD-10-CM | POA: Insufficient documentation

## 2012-04-10 DIAGNOSIS — Z8639 Personal history of other endocrine, nutritional and metabolic disease: Secondary | ICD-10-CM | POA: Insufficient documentation

## 2012-04-10 DIAGNOSIS — R1310 Dysphagia, unspecified: Secondary | ICD-10-CM | POA: Insufficient documentation

## 2012-04-10 DIAGNOSIS — Z79899 Other long term (current) drug therapy: Secondary | ICD-10-CM | POA: Insufficient documentation

## 2012-04-10 DIAGNOSIS — Z9889 Other specified postprocedural states: Secondary | ICD-10-CM | POA: Insufficient documentation

## 2012-04-10 DIAGNOSIS — F17200 Nicotine dependence, unspecified, uncomplicated: Secondary | ICD-10-CM | POA: Insufficient documentation

## 2012-04-10 DIAGNOSIS — F1410 Cocaine abuse, uncomplicated: Secondary | ICD-10-CM | POA: Insufficient documentation

## 2012-04-10 DIAGNOSIS — Z8619 Personal history of other infectious and parasitic diseases: Secondary | ICD-10-CM | POA: Insufficient documentation

## 2012-04-10 LAB — RAPID STREP SCREEN (MED CTR MEBANE ONLY): Streptococcus, Group A Screen (Direct): NEGATIVE

## 2012-04-10 MED ORDER — PREDNISOLONE SODIUM PHOSPHATE 15 MG/5ML PO SOLN
45.0000 mg | Freq: Once | ORAL | Status: AC
  Administered 2012-04-10: 45 mg via ORAL
  Filled 2012-04-10: qty 3

## 2012-04-10 MED ORDER — CLINDAMYCIN PALMITATE HCL 75 MG/5ML PO SOLR
300.0000 mg | Freq: Once | ORAL | Status: AC
  Administered 2012-04-10: 300 mg via ORAL
  Filled 2012-04-10: qty 20

## 2012-04-10 MED ORDER — IOHEXOL 300 MG/ML  SOLN
80.0000 mL | Freq: Once | INTRAMUSCULAR | Status: AC | PRN
  Administered 2012-04-10: 80 mL via INTRAVENOUS

## 2012-04-10 NOTE — ED Notes (Signed)
CT notified pt ready for transport

## 2012-04-10 NOTE — ED Notes (Signed)
Patient transported to CT 

## 2012-04-10 NOTE — ED Provider Notes (Signed)
History   This chart was scribed for Suzi Roots, MD, by Frederik Pear, ER scribe. The patient was seen in room TR08C/TR08C and the patient's care was started at 1808.    CSN: [id number]  Arrival date & time 04/10/12  1729   First MD Initiated Contact with Patient 04/10/12 1808      Chief Complaint  Patient presents with  . Sore Throat    (Consider location/radiation/quality/duration/timing/severity/associated sxs/prior treatment) Patient is a 47 y.o. female presenting with pharyngitis.  Sore Throat This is a new problem. The current episode started 6 to 12 hours ago. The problem occurs constantly. The problem has not changed since onset.The symptoms are aggravated by swallowing.    Ann Jarvis is a 47 y.o. female who presents to the Emergency Department complaining of a constant, moderate sore throat with difficulty swallowing that is aggravated by talking and began this morning. She denies any coughing or rhinorrhea. She has a h/o hyperthyroidism and hypertension.    Past Medical History  Diagnosis Date  . Hyperthyroidism   . Hypertension   . Tobacco abuse   . Cocaine abuse   . Scabies   . Grave's disease     s/p radioiodine ablation 03/05/10, F/B Dr. Chestine Spore. On Methimazole  . Thyroid condition     Past Surgical History  Procedure Date  . Btl   . Mouth surgery     No family history on file.  History  Substance Use Topics  . Smoking status: Current Every Day Smoker -- 0.1 packs/day    Types: Cigarettes  . Smokeless tobacco: Not on file     Comment: 1 cig a day  . Alcohol Use: Yes     Comment: 2-3 beers/night    OB History    Grav Para Term Preterm Abortions TAB SAB Ect Mult Living                  Review of Systems  HENT: Positive for sore throat and trouble swallowing. Negative for rhinorrhea.   Respiratory: Negative for cough.   All other systems reviewed and are negative.    Allergies  Penicillins  Home Medications   Current Outpatient  Rx  Name  Route  Sig  Dispense  Refill  . DIPHENHYDRAMINE HCL 25 MG PO CAPS   Oral   Take 25 mg by mouth every 6 (six) hours as needed. For sweeling         . HYDROCHLOROTHIAZIDE 25 MG PO TABS   Oral   Take 1 tablet (25 mg total) by mouth daily.   30 tablet   5   . METHIMAZOLE 5 MG PO TABS   Oral   Take 5 mg by mouth 2 (two) times daily.         Marland Kitchen METOPROLOL SUCCINATE ER 25 MG PO TB24   Oral   Take 1 tablet (25 mg total) by mouth daily.   30 tablet   6     BP 166/87  Pulse 77  Temp 99 F (37.2 C) (Oral)  Resp 20  SpO2 99%  LMP 03/25/2012  Physical Exam  Nursing note and vitals reviewed. Constitutional: She is oriented to person, place, and time. She appears well-developed and well-nourished. No distress.  HENT:  Head: Normocephalic and atraumatic.  Mouth/Throat: Uvula is midline.       She has peritonsillar swelling with mild erythema.  Eyes: EOM are normal. Pupils are equal, round, and reactive to light.  Neck: Normal range of motion.  Neck supple. No tracheal deviation present.  Cardiovascular: Normal rate.   Pulmonary/Chest: Effort normal. No respiratory distress.  Abdominal: Soft. She exhibits no distension.  Musculoskeletal: Normal range of motion. She exhibits no edema.  Neurological: She is alert and oriented to person, place, and time.  Skin: Skin is warm and dry.  Psychiatric: She has a normal mood and affect. Her behavior is normal.    ED Course  Procedures (including critical care time)  DIAGNOSTIC STUDIES: Oxygen Saturation is 99% on room air, normal by my interpretation.    COORDINATION OF CARE:  18:13- Discussed planned course of treatment with the patient, including a rapid strep test and soft tissue neck CT with and without contrast, who is agreeable at this time.  20:45- Medication Orders- iohexol (omnipaque) 300 mg/ml solution 80 mg- once.  Results for orders placed during the hospital encounter of 04/10/12  RAPID STREP SCREEN       Component Value Range   Streptococcus, Group A Screen (Direct) NEGATIVE  NEGATIVE     Labs Reviewed  RAPID STREP SCREEN     Ct Soft Tissue Neck W Contrast  04/10/2012  *RADIOLOGY REPORT*  Clinical Data: Sore throat.  Difficulty swallowing.  CT NECK WITH CONTRAST  Technique:  Multidetector CT imaging of the neck was performed with intravenous contrast.  Contrast: 80mL OMNIPAQUE IOHEXOL 300 MG/ML  SOLN  Comparison: None.  Findings: Abnormal soft tissue swelling noted in the region of the left tonsil and extends inferiorly to the left vallecula.  There is stranding and air within the left parapharyngeal space beginning just below the skull base and tracking inferiorly to the level of the left submandibular gland.  The left seminal gland is mildly enlarged relative to the right.  The left submandibular gland is not appear to enhance normally or demonstrate focal internal abnormality such as abscess.Likely reactive.  Airway is patent. Thyroid is unremarkable.  Vascular structures are widely patent.  Imaging into the upper lungs demonstrates no focal abnormality.  No acute bony abnormality.  Air-fluid level within the left maxillary sinus compatible with acute sinusitis.  Paranasal sinuses otherwise clear.  Mastoids are clear.  IMPRESSION:  Abnormal enlargement of the left tonsils which extends inferiorly to the region of the left vallecula.  Abnormal stranding and air within the left parapharyngeal space.  I suspect these findings are primarily related to tonsillitis.  The left submandibular and is enlarged, presumably secondarily.  Acute left maxillary sinusitis.   Original Report Authenticated By: Charlett Nose, M.D.      No diagnosis found.  Tonsillitis.  Spoke with on call ENT regarding patient. His recommendation is clindamycin, 300 mg, in liquid form, q 6 hours (will need total dosing for 2 weeks) and prednisolone taper (45 mg q day x 3 days, 30 mg q day x 3 days, and 15 mg q day x 3 days).  I have  contacted [facility] to see what assistance is available to help patient obtain her medications on the "orange card" program.  The [facility] MD will call back with recommendations.  Local pharmacy was not able to provide cost without having both the orange card and prescription.  At this point, plan to keep patient overnight in ED, repeat dosing in AM, and have case manager consult.  MDM     I personally performed the services described in this documentation, which was scribed in my presence. The recorded information has been reviewed and is accurate.       Jimmye Norman,  NP 04/11/12 0123

## 2012-04-10 NOTE — ED Notes (Signed)
Pt c/o sore throat onset this am.

## 2012-04-11 MED ORDER — CLINDAMYCIN PALMITATE HCL 75 MG/5ML PO SOLR: 300.0000 mg | Freq: Four times a day (QID) | ORAL | Status: DC

## 2012-04-11 MED ORDER — ONDANSETRON 4 MG PO TBDP
ORAL_TABLET | ORAL | Status: AC
  Filled 2012-04-11: qty 2

## 2012-04-11 MED ORDER — CLINDAMYCIN HCL 150 MG PO CAPS: 300.0000 mg | ORAL_CAPSULE | Freq: Three times a day (TID) | ORAL | Status: DC

## 2012-04-11 MED ORDER — CLINDAMYCIN PALMITATE HCL 75 MG/5ML PO SOLR
300.0000 mg | Freq: Once | ORAL | Status: AC
  Administered 2012-04-11: 300 mg via ORAL
  Filled 2012-04-11: qty 20

## 2012-04-11 MED ORDER — PREDNISOLONE SODIUM PHOSPHATE 15 MG/5ML PO SOLN: ORAL | Status: DC

## 2012-04-11 MED ORDER — HYDROCODONE-ACETAMINOPHEN 7.5-325 MG/15ML PO SOLN: 15.0000 mL | Freq: Three times a day (TID) | ORAL | Status: DC | PRN

## 2012-04-11 MED ORDER — KETOROLAC TROMETHAMINE 30 MG/ML IJ SOLN
30.0000 mg | Freq: Once | INTRAMUSCULAR | Status: AC
  Administered 2012-04-11: 30 mg via INTRAVENOUS
  Filled 2012-04-11: qty 1

## 2012-04-11 NOTE — ED Provider Notes (Signed)
Spoke with case management at 2:08am on 02/09/2013. Case management will be in to see patient at 8:00am this morning. Pt currently stable.  Dorthula Matas, PA 04/11/12 [phone]

## 2012-04-11 NOTE — ED Notes (Addendum)
Pt sleeping, no changes, remains on monitor, VSS.

## 2012-04-11 NOTE — ED Notes (Signed)
Pt requested a sandwich and some water to drink.  Dr Hyacinth Meeker consulted and stated to give pt food as requested.  Sandwich  provided

## 2012-04-11 NOTE — ED Notes (Signed)
Pt alert, NAD, calm, interactive, resps e/u, speaking in clear complete sentences, speech altered, handling secretions (swallowing saliva), watching TV, has eaten complete happy meal given by previous RN (sandwich finished). Pt to see Case Manager in am re: specific Rx for abx  and meds to be subsidized vs. given/partially funded by grant.

## 2012-04-11 NOTE — ED Notes (Signed)
Pt stated that she was able to finish her sandwich without issue.

## 2012-04-11 NOTE — Progress Notes (Signed)
   CARE MANAGEMENT NOTE 04/11/2012  Patient:  Pressey,Vaani   Account Number:  [id number]  Date Initiated:  04/11/2012  Documentation initiated by:  [facility]  Subjective/Objective Assessment:     Action/Plan:   Anticipated DC Date:  04/11/2012   Anticipated DC Plan:  HOME/SELF CARE      DC Planning Services  CM consult  MATCH Program      Choice offered to / List presented to:             Status of service:  Completed, signed off Medicare Important Message given?   (If response is "NO", the following Medicare IM given date fields will be blank) Date Medicare IM given:   Date Additional Medicare IM given:    Discharge Disposition:  HOME/SELF CARE  Per UR Regulation:    If discussed at Long Length of Stay Meetings, dates discussed:    Comments:  04/11/2012 1000 Pt unable to afford her abx for home. She goes to [facility] and has orange card. Medication was too expensive. Completed MATCH and explained she can use program benefits once per year. Contacted [facility] on [address] and they do have abx and Prednisolone in stock. Isidoro Donning RN CCM Case Mgmt phone [phone]

## 2012-04-11 NOTE — ED Provider Notes (Signed)
Medical screening examination/treatment/procedure(s) were performed by non-physician practitioner and as supervising physician I was immediately available for consultation/collaboration.  Byrne Capek K Ezekial Arns-Rasch, MD 04/11/12 0332 

## 2012-04-11 NOTE — ED Provider Notes (Signed)
I have personally evaluated the patient at the request of nursing staff secondary to the patient's sore throat and the need for ongoing medications. Initially and oral solution clindamycin was requested secondary to the patient's sore throat and swelling however during her emergency department stay, she ate 2 sandwiches without difficulty.  I have changed her prescription to oral capsules, this will be filled by case management and the patient will be discharged home. She appears clinically stable and I have reviewed her CT scan with her.  On my exam she has mild swelling of her tonsils, patent airway, clear speech, ability to swallow is intact and otherwise does not appear toxic in any way.  She has expressed her understanding for the indications for followup  Vida Roller, MD 04/11/12 0700

## 2012-04-12 NOTE — ED Provider Notes (Signed)
Medical screening examination/treatment/procedure(s) were conducted as a shared visit with non-physician practitioner(s) and myself.  I personally evaluated the patient during the encounter Pt with sore throat, predominantly on left side. Mild asymmetric pharyngeal swelling on left. Will get ent consult. No stridor. Is able to swallow, no difficulty breathing.   Suzi Roots, MD 04/12/12 [phone]

## 2012-04-14 ENCOUNTER — Encounter: Payer: Self-pay | Admitting: Internal Medicine

## 2012-04-14 ENCOUNTER — Ambulatory Visit: Payer: Self-pay

## 2012-04-23 ENCOUNTER — Ambulatory Visit: Payer: Self-pay

## 2012-04-28 ENCOUNTER — Ambulatory Visit: Payer: Self-pay

## 2012-04-28 ENCOUNTER — Encounter: Payer: Self-pay | Admitting: Internal Medicine

## 2012-05-19 ENCOUNTER — Encounter (HOSPITAL_COMMUNITY): Payer: Self-pay | Admitting: *Deleted

## 2012-05-19 ENCOUNTER — Emergency Department (HOSPITAL_COMMUNITY)
Admission: EM | Admit: 2012-05-19 | Discharge: 2012-05-19 | Disposition: A | Discharge status: Home / Self Care | Payer: Self-pay | Attending: Emergency Medicine | Admitting: Emergency Medicine

## 2012-05-19 DIAGNOSIS — Z862 Personal history of diseases of the blood and blood-forming organs and certain disorders involving the immune mechanism: Secondary | ICD-10-CM | POA: Insufficient documentation

## 2012-05-19 DIAGNOSIS — I10 Essential (primary) hypertension: Secondary | ICD-10-CM | POA: Insufficient documentation

## 2012-05-19 DIAGNOSIS — Z872 Personal history of diseases of the skin and subcutaneous tissue: Secondary | ICD-10-CM | POA: Insufficient documentation

## 2012-05-19 DIAGNOSIS — Z79899 Other long term (current) drug therapy: Secondary | ICD-10-CM | POA: Insufficient documentation

## 2012-05-19 DIAGNOSIS — M778 Other enthesopathies, not elsewhere classified: Secondary | ICD-10-CM

## 2012-05-19 DIAGNOSIS — M6580 Other synovitis and tenosynovitis, unspecified site: Secondary | ICD-10-CM | POA: Insufficient documentation

## 2012-05-19 DIAGNOSIS — E0590 Thyrotoxicosis, unspecified without thyrotoxic crisis or storm: Secondary | ICD-10-CM | POA: Insufficient documentation

## 2012-05-19 DIAGNOSIS — F17200 Nicotine dependence, unspecified, uncomplicated: Secondary | ICD-10-CM | POA: Insufficient documentation

## 2012-05-19 DIAGNOSIS — Z8639 Personal history of other endocrine, nutritional and metabolic disease: Secondary | ICD-10-CM | POA: Insufficient documentation

## 2012-05-19 DIAGNOSIS — M25529 Pain in unspecified elbow: Secondary | ICD-10-CM | POA: Insufficient documentation

## 2012-05-19 MED ORDER — IBUPROFEN 800 MG PO TABS: 800.0000 mg | ORAL_TABLET | Freq: Three times a day (TID) | ORAL | Status: DC

## 2012-05-19 NOTE — ED Provider Notes (Signed)
Medical screening examination/treatment/procedure(s) were performed by non-physician practitioner and as supervising physician I was immediately available for consultation/collaboration.   Mylynn Dinh III, MD 05/19/12 1846 

## 2012-05-19 NOTE — ED Notes (Addendum)
Pt c/o arm pain starting at R elbow and radiating down to R hand x 1 month that has increased the last 1.5 weeks.  Pt hypertensive -  States she did not take her bp meds today.

## 2012-05-19 NOTE — ED Notes (Signed)
Patients elevated blood pressure reported to [id number].

## 2012-05-19 NOTE — ED Provider Notes (Signed)
History     CSN: [id number]  Arrival date & time 05/19/12  1220   First MD Initiated Contact with Patient 05/19/12 1324      Chief Complaint  Patient presents with  . Arm Pain    (Consider location/radiation/quality/duration/timing/severity/associated sxs/prior treatment) HPI Comments: Pt presents today for right elbow pain.  This is a chronic issue but has increased in severity over the past week or so after she hit her elbow on a door.  Pain is described as an achy sensation that is "deep in her elbow".  Has tried using icy hot and bengay with minimal relief.  Has not taken any pain medications. Denies any numbness, tingling, or loss of sensation to her right hand.  Has a prior dx of tendonitis per pt.  The history is provided by the patient.    Past Medical History  Diagnosis Date  . Hypertension   . Tobacco abuse   . Cocaine abuse   . Scabies   . Hyperthyroidism   . Grave's disease     s/p radioiodine ablation 03/05/10, F/B Dr. Chestine Spore. On Methimazole  . Thyroid condition     Past Surgical History  Procedure Laterality Date  . Btl    . Mouth surgery      No family history on file.  History  Substance Use Topics  . Smoking status: Current Every Day Smoker -- 0.10 packs/day    Types: Cigarettes  . Smokeless tobacco: Not on file     Comment: 1 cig a day  . Alcohol Use: Yes     Comment: 2-3 beers/night    OB History   Grav Para Term Preterm Abortions TAB SAB Ect Mult Living                  Review of Systems  Musculoskeletal: Positive for arthralgias.  All other systems reviewed and are negative.    Allergies  Penicillins  Home Medications   Current Outpatient Rx  Name  Route  Sig  Dispense  Refill  . methimazole (TAPAZOLE) 5 MG tablet   Oral   Take 5 mg by mouth 2 (two) times daily.         . metoprolol succinate (TOPROL-XL) 25 MG 24 hr tablet   Oral   Take 1 tablet (25 mg total) by mouth daily.   30 tablet   6     BP 182/105  Pulse 79   Temp(Src) 97.8 F (36.6 C) (Oral)  Resp 20  SpO2 99%  LMP 05/13/2012  Physical Exam  Nursing note and vitals reviewed. Constitutional: She is oriented to person, place, and time. She appears well-developed and well-nourished.  HENT:  Head: Normocephalic and atraumatic.  Mouth/Throat: Oropharynx is clear and moist.  Eyes: Conjunctivae and EOM are normal. Pupils are equal, round, and reactive to light.  Neck: Normal range of motion.  Cardiovascular: Normal rate, regular rhythm and normal heart sounds.   Pulmonary/Chest: Effort normal and breath sounds normal.  Abdominal: Soft. Bowel sounds are normal.  Musculoskeletal:       Right elbow: She exhibits normal range of motion, no swelling, no effusion, no deformity and no laceration. No lateral epicondyle tenderness noted.  No TTP of right lateral elbow  Neurological: She is alert and oriented to person, place, and time.  Skin: Skin is warm and dry.  Psychiatric: She has a normal mood and affect.    ED Course  Procedures (including critical care time)  Labs Reviewed - No data to  display No results found.   1. Elbow pain   2. Tendonitis of elbow, right       MDM  1:32 PM Pt evaluated.  Complaining of known right lateral elbow pain but has been increasing over the past week.  Pain is mostly likely exacerbation of her tendonitis.  Normal ROM of right elbow, no neurological or sensory deficit to right hand.  No swelling or deformity noted. Will give anti-inflammatories.  Instructed to try RICE routine on elbow for added relief.  Return to ED for new concerns.        Garlon Hatchet, PA-C 05/19/12 1515

## 2012-10-24 ENCOUNTER — Encounter (HOSPITAL_COMMUNITY): Payer: Self-pay | Admitting: Emergency Medicine

## 2012-10-24 ENCOUNTER — Emergency Department (HOSPITAL_COMMUNITY)
Admission: EM | Admit: 2012-10-24 | Discharge: 2012-10-25 | Disposition: A | Discharge status: Home / Self Care | Payer: Self-pay | Attending: Emergency Medicine | Admitting: Emergency Medicine

## 2012-10-24 DIAGNOSIS — Z79899 Other long term (current) drug therapy: Secondary | ICD-10-CM | POA: Insufficient documentation

## 2012-10-24 DIAGNOSIS — R197 Diarrhea, unspecified: Secondary | ICD-10-CM | POA: Insufficient documentation

## 2012-10-24 DIAGNOSIS — Z862 Personal history of diseases of the blood and blood-forming organs and certain disorders involving the immune mechanism: Secondary | ICD-10-CM | POA: Insufficient documentation

## 2012-10-24 DIAGNOSIS — K5289 Other specified noninfective gastroenteritis and colitis: Secondary | ICD-10-CM | POA: Insufficient documentation

## 2012-10-24 DIAGNOSIS — Z8639 Personal history of other endocrine, nutritional and metabolic disease: Secondary | ICD-10-CM | POA: Insufficient documentation

## 2012-10-24 DIAGNOSIS — Z872 Personal history of diseases of the skin and subcutaneous tissue: Secondary | ICD-10-CM | POA: Insufficient documentation

## 2012-10-24 DIAGNOSIS — K529 Noninfective gastroenteritis and colitis, unspecified: Secondary | ICD-10-CM

## 2012-10-24 DIAGNOSIS — I10 Essential (primary) hypertension: Secondary | ICD-10-CM | POA: Insufficient documentation

## 2012-10-24 DIAGNOSIS — F17200 Nicotine dependence, unspecified, uncomplicated: Secondary | ICD-10-CM | POA: Insufficient documentation

## 2012-10-24 DIAGNOSIS — Z3202 Encounter for pregnancy test, result negative: Secondary | ICD-10-CM | POA: Insufficient documentation

## 2012-10-24 DIAGNOSIS — Z880 Allergy status to penicillin: Secondary | ICD-10-CM | POA: Insufficient documentation

## 2012-10-24 DIAGNOSIS — E079 Disorder of thyroid, unspecified: Secondary | ICD-10-CM | POA: Insufficient documentation

## 2012-10-24 LAB — CBC WITH DIFFERENTIAL/PLATELET
Basophils Absolute: 0.1 10*3/uL (ref 0.0–0.1)
Basophils Relative: 1 % (ref 0–1)
Eosinophils Absolute: 0.2 K/uL (ref 0.0–0.7)
Eosinophils Relative: 2 % (ref 0–5)
HCT: 34.8 % — ABNORMAL LOW (ref 36.0–46.0)
Hemoglobin: 11.6 g/dL — ABNORMAL LOW (ref 12.0–15.0)
Lymphocytes Relative: 29 % (ref 12–46)
Lymphs Abs: 1.9 K/uL (ref 0.7–4.0)
MCH: 23.7 pg — ABNORMAL LOW (ref 26.0–34.0)
MCHC: 33.3 g/dL (ref 30.0–36.0)
MCV: 71.2 fL — ABNORMAL LOW (ref 78.0–100.0)
Monocytes Absolute: 0.6 10*3/uL (ref 0.1–1.0)
Monocytes Relative: 9 % (ref 3–12)
Neutro Abs: 3.8 10*3/uL (ref 1.7–7.7)
Neutrophils Relative %: 59 % (ref 43–77)
Platelets: 273 10*3/uL (ref 150–400)
RBC: 4.89 MIL/uL (ref 3.87–5.11)
RDW: 15.8 % — ABNORMAL HIGH (ref 11.5–15.5)
WBC: 6.5 10*3/uL (ref 4.0–10.5)

## 2012-10-24 LAB — URINALYSIS, ROUTINE W REFLEX MICROSCOPIC
Bilirubin Urine: NEGATIVE
Glucose, UA: NEGATIVE mg/dL
Ketones, ur: NEGATIVE mg/dL
Nitrite: NEGATIVE
Protein, ur: 30 mg/dL — AB
Specific Gravity, Urine: 1.021 (ref 1.005–1.030)
Urobilinogen, UA: 1 mg/dL (ref 0.0–1.0)
pH: 6.5 (ref 5.0–8.0)

## 2012-10-24 LAB — URINE MICROSCOPIC-ADD ON

## 2012-10-24 LAB — COMPREHENSIVE METABOLIC PANEL WITH GFR
Alkaline Phosphatase: 60 U/L (ref 39–117)
BUN: 14 mg/dL (ref 6–23)
Chloride: 102 meq/L (ref 96–112)
GFR calc Af Amer: 82 mL/min — ABNORMAL LOW (ref >90)
GFR calc non Af Amer: 71 mL/min — ABNORMAL LOW (ref >90)
Glucose, Bld: 71 mg/dL (ref 70–99)
Potassium: 3.2 meq/L — ABNORMAL LOW (ref 3.5–5.1)
Total Bilirubin: 0.3 mg/dL (ref 0.3–1.2)

## 2012-10-24 LAB — COMPREHENSIVE METABOLIC PANEL
ALT: 14 U/L (ref 0–35)
AST: 23 U/L (ref 0–37)
Albumin: 3.6 g/dL (ref 3.5–5.2)
CO2: 26 mEq/L (ref 19–32)
Calcium: 9 mg/dL (ref 8.4–10.5)
Creatinine, Ser: 0.94 mg/dL (ref 0.50–1.10)
Sodium: 137 mEq/L (ref 135–145)
Total Protein: 8 g/dL (ref 6.0–8.3)

## 2012-10-24 LAB — PREGNANCY, URINE: Preg Test, Ur: NEGATIVE

## 2012-10-24 MED ORDER — MORPHINE SULFATE 4 MG/ML IJ SOLN
4.0000 mg | Freq: Once | INTRAMUSCULAR | Status: AC
  Administered 2012-10-24: 4 mg via INTRAVENOUS
  Filled 2012-10-24: qty 1

## 2012-10-24 MED ORDER — ONDANSETRON HCL 4 MG/2ML IJ SOLN
4.0000 mg | Freq: Once | INTRAMUSCULAR | Status: AC
  Administered 2012-10-24: 4 mg via INTRAVENOUS
  Filled 2012-10-24: qty 2

## 2012-10-24 MED ORDER — SODIUM CHLORIDE 0.9 % IV BOLUS (SEPSIS)
1000.0000 mL | Freq: Once | INTRAVENOUS | Status: AC
  Administered 2012-10-24: 1000 mL via INTRAVENOUS

## 2012-10-24 NOTE — ED Notes (Signed)
PT. REPORTS NAUSEA , VOMITTING AND DIARRHEA FOR SEVERAL DAYS , ABDOMINAL CRAMPING WHEN VOMITTING , DENIES FEVER OR CHILLS.

## 2012-10-24 NOTE — ED Provider Notes (Signed)
CSN: [id number]     Arrival date & time 10/24/12  2009 History     First MD Initiated Contact with Patient 10/24/12 2204     Chief Complaint  Patient presents with  . Emesis  . Diarrhea   (Consider location/radiation/quality/duration/timing/severity/associated sxs/prior Treatment) HPI Comments: Patient is a 47 year old female past medical history significant for hypertension, thyroid disease presented to the emergency department 2 days of nausea, nonbilious nonbloody vomiting, nonbloody diarrhea with associated lower abdominal cramping. Patient states her abdominal pain only occurs with episodes of vomiting or diarrhea. She rates her current pain 5/10 with no alleviating or relieving factors. Patient states she feels as though she may have been something suspicious. She denies any fevers chills. Patient is currently on her menstrual cycle. Patient denies any history of abdominal surgeries.  Patient is a 47 y.o. female presenting with vomiting and diarrhea.  Emesis Associated symptoms: abdominal pain and diarrhea   Associated symptoms: no chills   Diarrhea Associated symptoms: abdominal pain and vomiting   Associated symptoms: no chills and no fever      Past Medical History  Diagnosis Date  . Hypertension   . Tobacco abuse   . Cocaine abuse   . Scabies   . Hyperthyroidism   . Grave's disease     s/p radioiodine ablation 03/05/10, F/B Dr. Chestine Spore. On Methimazole  . Thyroid condition    Past Surgical History  Procedure Laterality Date  . Btl    . Mouth surgery     No family history on file. History  Substance Use Topics  . Smoking status: Current Every Day Smoker -- 0.10 packs/day    Types: Cigarettes  . Smokeless tobacco: Not on file     Comment: 1 cig a day  . Alcohol Use: Yes     Comment: 2-3 beers/night   OB History   Grav Para Term Preterm Abortions TAB SAB Ect Mult Living                 Review of Systems  Constitutional: Negative for fever and chills.  HENT:  Negative.   Eyes: Negative.   Respiratory: Negative.   Cardiovascular: Negative.   Gastrointestinal: Positive for nausea, vomiting, abdominal pain and diarrhea. Negative for constipation, blood in stool, abdominal distention, anal bleeding and rectal pain.  Genitourinary: Positive for vaginal bleeding. Negative for dysuria, urgency, frequency, flank pain, decreased urine volume, vaginal discharge, vaginal pain and pelvic pain.       On menstrual cycle  Musculoskeletal: Negative.   Skin: Negative.   Neurological: Negative.     Allergies  Penicillins  Home Medications   Current Outpatient Rx  Name  Route  Sig  Dispense  Refill  . diphenhydrAMINE (BENADRYL) 25 MG tablet   Oral   Take 50 mg by mouth every 6 (six) hours as needed for itching.         Marland Kitchen ibuprofen (ADVIL,MOTRIN) 800 MG tablet   Oral   Take 1 tablet (800 mg total) by mouth 3 (three) times daily.   21 tablet   0   . methimazole (TAPAZOLE) 5 MG tablet   Oral   Take 5 mg by mouth 2 (two) times daily.         . metoprolol succinate (TOPROL-XL) 25 MG 24 hr tablet   Oral   Take 1 tablet (25 mg total) by mouth daily.   30 tablet   6   . ondansetron (ZOFRAN) 4 MG tablet   Oral   Take  1 tablet (4 mg total) by mouth every 6 (six) hours.   12 tablet   0    BP 191/91  Pulse 88  Temp(Src) 98.2 F (36.8 C) (Oral)  Resp 18  SpO2 97%  LMP 10/24/2012 Physical Exam  Constitutional: She is oriented to person, place, and time. She appears well-developed and well-nourished.  HENT:  Head: Normocephalic and atraumatic.  Mouth/Throat: No oropharyngeal exudate.  Eyes: Conjunctivae are normal.  Neck: Neck supple.  Cardiovascular: Normal rate, regular rhythm, normal heart sounds and intact distal pulses.   Pulmonary/Chest: Effort normal and breath sounds normal. No respiratory distress.  Abdominal: Soft. Bowel sounds are normal. She exhibits no distension. There is generalized tenderness. There is no rigidity, no  rebound, no guarding and no CVA tenderness.  Musculoskeletal: Normal range of motion.  Neurological: She is alert and oriented to person, place, and time.  Skin: Skin is warm and dry.    ED Course   Procedures (including critical care time)  Medications  sodium chloride 0.9 % bolus 1,000 mL (0 mLs Intravenous Stopped 10/25/12 0019)  ondansetron (ZOFRAN) injection 4 mg (4 mg Intravenous Given 10/24/12 2300)  morphine 4 MG/ML injection 4 mg (4 mg Intravenous Given 10/24/12 2300)    Labs Reviewed  URINALYSIS, ROUTINE W REFLEX MICROSCOPIC - Abnormal; Notable for the following:    APPearance CLOUDY (*)    Hgb urine dipstick LARGE (*)    Protein, ur 30 (*)    Leukocytes, UA SMALL (*)    All other components within normal limits  CBC WITH DIFFERENTIAL - Abnormal; Notable for the following:    Hemoglobin 11.6 (*)    HCT 34.8 (*)    MCV 71.2 (*)    MCH 23.7 (*)    RDW 15.8 (*)    All other components within normal limits  COMPREHENSIVE METABOLIC PANEL - Abnormal; Notable for the following:    Potassium 3.2 (*)    GFR calc non Af Amer 71 (*)    GFR calc Af Amer 82 (*)    All other components within normal limits  URINE MICROSCOPIC-ADD ON - Abnormal; Notable for the following:    Squamous Epithelial / LPF FEW (*)    Bacteria, UA FEW (*)    All other components within normal limits  PREGNANCY, URINE  POCT PREGNANCY, URINE   No results found. 1. Gastroenteritis     MDM  Patient with symptoms consistent with viral gastroenteritis.  Vitals are stable, no fever.  No signs of dehydration, tolerating PO fluids > 6 oz.  Lungs are clear.  No focal abdominal pain, no concern for appendicitis, cholecystitis, pancreatitis, ruptured viscus, UTI, kidney stone, or any other abdominal etiology.  Supportive therapy indicated with return if symptoms worsen.  Pt improved after IVF, pain medication and nausea medication. Patient counseled. Patient is agreeable to plan. Patient d/w with Dr. Oletta Lamas, agrees  with plan. Patient is stable at time of discharge    Jeannetta Haacke, PA-C 10/25/12 1610

## 2012-10-25 MED ORDER — ONDANSETRON HCL 4 MG PO TABS: 4.0000 mg | ORAL_TABLET | Freq: Four times a day (QID) | ORAL | Status: DC

## 2012-10-26 NOTE — ED Provider Notes (Signed)
Medical screening examination/treatment/procedure(s) were performed by non-physician practitioner and as supervising physician I was immediately available for consultation/collaboration.   Gavin Pound. Katrine Radich, MD 10/26/12 2324

## 2012-11-05 ENCOUNTER — Encounter (HOSPITAL_COMMUNITY): Payer: Self-pay

## 2012-11-05 ENCOUNTER — Emergency Department (HOSPITAL_COMMUNITY)
Admission: EM | Admit: 2012-11-05 | Discharge: 2012-11-05 | Disposition: A | Discharge status: Home / Self Care | Payer: Self-pay | Attending: Emergency Medicine | Admitting: Emergency Medicine

## 2012-11-05 DIAGNOSIS — Z880 Allergy status to penicillin: Secondary | ICD-10-CM | POA: Insufficient documentation

## 2012-11-05 DIAGNOSIS — I10 Essential (primary) hypertension: Secondary | ICD-10-CM | POA: Insufficient documentation

## 2012-11-05 DIAGNOSIS — R109 Unspecified abdominal pain: Secondary | ICD-10-CM

## 2012-11-05 DIAGNOSIS — Z79899 Other long term (current) drug therapy: Secondary | ICD-10-CM | POA: Insufficient documentation

## 2012-11-05 DIAGNOSIS — Z3202 Encounter for pregnancy test, result negative: Secondary | ICD-10-CM | POA: Insufficient documentation

## 2012-11-05 DIAGNOSIS — Z862 Personal history of diseases of the blood and blood-forming organs and certain disorders involving the immune mechanism: Secondary | ICD-10-CM | POA: Insufficient documentation

## 2012-11-05 DIAGNOSIS — R110 Nausea: Secondary | ICD-10-CM | POA: Insufficient documentation

## 2012-11-05 DIAGNOSIS — R1013 Epigastric pain: Secondary | ICD-10-CM | POA: Insufficient documentation

## 2012-11-05 DIAGNOSIS — F17200 Nicotine dependence, unspecified, uncomplicated: Secondary | ICD-10-CM | POA: Insufficient documentation

## 2012-11-05 DIAGNOSIS — Z8639 Personal history of other endocrine, nutritional and metabolic disease: Secondary | ICD-10-CM | POA: Insufficient documentation

## 2012-11-05 DIAGNOSIS — Z872 Personal history of diseases of the skin and subcutaneous tissue: Secondary | ICD-10-CM | POA: Insufficient documentation

## 2012-11-05 LAB — POCT PREGNANCY, URINE: Preg Test, Ur: NEGATIVE

## 2012-11-05 LAB — COMPREHENSIVE METABOLIC PANEL
ALT: 11 U/L (ref 0–35)
AST: 18 U/L (ref 0–37)
Albumin: 3.4 g/dL — ABNORMAL LOW (ref 3.5–5.2)
CO2: 28 mEq/L (ref 19–32)
Calcium: 8.8 mg/dL (ref 8.4–10.5)
Creatinine, Ser: 0.84 mg/dL (ref 0.50–1.10)
GFR calc non Af Amer: 81 mL/min — ABNORMAL LOW (ref >90)
Sodium: 136 mEq/L (ref 135–145)
Total Protein: 7.6 g/dL (ref 6.0–8.3)

## 2012-11-05 LAB — CBC WITH DIFFERENTIAL/PLATELET
Eosinophils Absolute: 0.2 10*3/uL (ref 0.0–0.7)
HCT: 32.7 % — ABNORMAL LOW (ref 36.0–46.0)
Hemoglobin: 10.5 g/dL — ABNORMAL LOW (ref 12.0–15.0)
Lymphs Abs: 1.4 10*3/uL (ref 0.7–4.0)
MCH: 23 pg — ABNORMAL LOW (ref 26.0–34.0)
Monocytes Absolute: 0.7 10*3/uL (ref 0.1–1.0)
Monocytes Relative: 10 % (ref 3–12)
Neutrophils Relative %: 66 % (ref 43–77)
RBC: 4.57 MIL/uL (ref 3.87–5.11)

## 2012-11-05 LAB — URINALYSIS, ROUTINE W REFLEX MICROSCOPIC
Glucose, UA: NEGATIVE mg/dL
Hgb urine dipstick: NEGATIVE
Ketones, ur: NEGATIVE mg/dL
Leukocytes, UA: NEGATIVE
pH: 7 (ref 5.0–8.0)

## 2012-11-05 MED ORDER — GI COCKTAIL ~~LOC~~
30.0000 mL | Freq: Once | ORAL | Status: AC
  Administered 2012-11-05: 30 mL via ORAL
  Filled 2012-11-05: qty 30

## 2012-11-05 MED ORDER — FAMOTIDINE IN NACL 20-0.9 MG/50ML-% IV SOLN
20.0000 mg | Freq: Once | INTRAVENOUS | Status: AC
  Administered 2012-11-05: 20 mg via INTRAVENOUS
  Filled 2012-11-05: qty 50

## 2012-11-05 MED ORDER — FAMOTIDINE 20 MG PO TABS: 20.0000 mg | ORAL_TABLET | Freq: Two times a day (BID) | ORAL | Status: DC

## 2012-11-05 MED ORDER — HYDROMORPHONE HCL PF 1 MG/ML IJ SOLN
1.0000 mg | Freq: Once | INTRAMUSCULAR | Status: AC
  Administered 2012-11-05: 1 mg via INTRAVENOUS
  Filled 2012-11-05: qty 1

## 2012-11-05 MED ORDER — SODIUM CHLORIDE 0.9 % IV BOLUS (SEPSIS)
1000.0000 mL | Freq: Once | INTRAVENOUS | Status: AC
  Administered 2012-11-05: 1000 mL via INTRAVENOUS

## 2012-11-05 NOTE — ED Notes (Signed)
MD Lockwood at bedside.  

## 2012-11-05 NOTE — ED Notes (Signed)
Lockwood MD at bedside. 

## 2012-11-05 NOTE — ED Notes (Signed)
Pt discharged home w/all belongings, alert and ambulatory upon discharge, pt verbalizes understanding of discharge instructions, 1 new rx prescribed, pt driven home by friend

## 2012-11-05 NOTE — ED Provider Notes (Signed)
CSN: [id number]     Arrival date & time 11/05/12  0705 History     First MD Initiated Contact with Patient 11/05/12 [phone]     Chief Complaint  Patient presents with  . Abdominal Pain   (Consider location/radiation/quality/duration/timing/severity/associated sxs/prior Treatment) HPI Patient presents with abdominal pain.  Pain began in the hours prior to arrival.  The pain is epigastric, nonradiating with associated nausea, no vomiting, no diarrhea, no fever, chills. Patient does drink and smoke. Past Medical History  Diagnosis Date  . Hypertension   . Tobacco abuse   . Cocaine abuse   . Scabies   . Hyperthyroidism   . Grave's disease     s/p radioiodine ablation 03/05/10, F/B Dr. Chestine Spore. On Methimazole  . Thyroid condition    Past Surgical History  Procedure Laterality Date  . Btl    . Mouth surgery     History reviewed. No pertinent family history. History  Substance Use Topics  . Smoking status: Current Every Day Smoker -- 0.10 packs/day    Types: Cigarettes  . Smokeless tobacco: Not on file     Comment: 1 cig a day  . Alcohol Use: Yes     Comment: 2-3 beers/night   OB History   Grav Para Term Preterm Abortions TAB SAB Ect Mult Living                 Review of Systems  Constitutional:       Per HPI, otherwise negative  HENT:       Per HPI, otherwise negative  Respiratory:       Per HPI, otherwise negative  Cardiovascular:       Per HPI, otherwise negative  Gastrointestinal: Positive for nausea. Negative for vomiting.  Endocrine:       Negative aside from HPI  Genitourinary:       Neg aside from HPI   Musculoskeletal:       Per HPI, otherwise negative  Skin: Negative.   Neurological: Negative for syncope.    Allergies  Penicillins  Home Medications   Current Outpatient Rx  Name  Route  Sig  Dispense  Refill  . diphenhydrAMINE (BENADRYL) 25 MG tablet   Oral   Take 50 mg by mouth every 6 (six) hours as needed for itching.         Marland Kitchen ibuprofen  (ADVIL,MOTRIN) 800 MG tablet   Oral   Take 1 tablet (800 mg total) by mouth 3 (three) times daily.   21 tablet   0   . methimazole (TAPAZOLE) 5 MG tablet   Oral   Take 5 mg by mouth 2 (two) times daily.         . metoprolol succinate (TOPROL-XL) 25 MG 24 hr tablet   Oral   Take 25 mg by mouth every morning.         . simethicone (MYLICON) 80 MG chewable tablet   Oral   Chew 80 mg by mouth every 6 (six) hours as needed for flatulence.         . famotidine (PEPCID) 20 MG tablet   Oral   Take 1 tablet (20 mg total) by mouth 2 (two) times daily.   30 tablet   0    BP 184/93  Pulse 62  Temp(Src) 98 F (36.7 C) (Oral)  Resp 16  Ht 5\' 11"  (1.803 m)  Wt 173 lb (78.472 kg)  BMI 24.14 kg/m2  SpO2 100%  LMP 10/24/2012 Physical Exam  Nursing  note and vitals reviewed. Constitutional: She is oriented to person, place, and time. She appears well-developed and well-nourished. No distress.  HENT:  Head: Normocephalic and atraumatic.  Eyes: Conjunctivae and EOM are normal.  Mild exophthalmos  Cardiovascular: Normal rate and regular rhythm.   Pulmonary/Chest: Effort normal and breath sounds normal. No stridor. No respiratory distress.  Abdominal: Soft. She exhibits no distension.  Minimal tenderness to palpation.  Musculoskeletal: She exhibits no edema.  Neurological: She is alert and oriented to person, place, and time. No cranial nerve deficit.  Skin: Skin is warm and dry.  Psychiatric: She has a normal mood and affect.    ED Course   Procedures (including critical care time)  Labs Reviewed  COMPREHENSIVE METABOLIC PANEL - Abnormal; Notable for the following:    Potassium 3.0 (*)    Albumin 3.4 (*)    GFR calc non Af Amer 81 (*)    All other components within normal limits  CBC WITH DIFFERENTIAL - Abnormal; Notable for the following:    Hemoglobin 10.5 (*)    HCT 32.7 (*)    MCV 71.6 (*)    MCH 23.0 (*)    RDW 16.0 (*)    All other components within normal  limits  LIPASE, BLOOD  URINALYSIS, ROUTINE W REFLEX MICROSCOPIC  POCT PREGNANCY, URINE   No results found. 1. Abdominal pain    Pulse ox 99% room air normal  MDM  Patient presents with epigastric pain.  This improved substantially following GI cocktail, Pepcid, fluids.  The patient was in no distress here, he is stable and unremarkable labs.  Patient was discharged in stable condition with instructions to stop smoking, and to reinitiate her medications as directed.  Gerhard Munch, MD 11/05/12 [phone]

## 2012-11-05 NOTE — ED Notes (Addendum)
Pt c/o intermittent generalized abd pain x2 days. Pt denies N/V/D, states "it feels like gas." Pt took gas ex w/no relief. Pt reports she was seen here last week for the same but could not afford to fill her prescription. Pt reports her pain is a 0/10 currently but when the "pain comes on" its a 10/10

## 2012-11-05 NOTE — ED Notes (Signed)
Pt now reports her pain is under her Left breast and radiates up into her chest

## 2012-11-11 ENCOUNTER — Emergency Department (HOSPITAL_COMMUNITY)
Admission: EM | Admit: 2012-11-11 | Discharge: 2012-11-11 | Disposition: A | Discharge status: Home / Self Care | Payer: Self-pay | Attending: Emergency Medicine | Admitting: Emergency Medicine

## 2012-11-11 ENCOUNTER — Encounter (HOSPITAL_COMMUNITY): Payer: Self-pay | Admitting: Emergency Medicine

## 2012-11-11 DIAGNOSIS — Z3202 Encounter for pregnancy test, result negative: Secondary | ICD-10-CM | POA: Insufficient documentation

## 2012-11-11 DIAGNOSIS — Z880 Allergy status to penicillin: Secondary | ICD-10-CM | POA: Insufficient documentation

## 2012-11-11 DIAGNOSIS — F17200 Nicotine dependence, unspecified, uncomplicated: Secondary | ICD-10-CM | POA: Insufficient documentation

## 2012-11-11 DIAGNOSIS — R109 Unspecified abdominal pain: Secondary | ICD-10-CM | POA: Insufficient documentation

## 2012-11-11 DIAGNOSIS — I10 Essential (primary) hypertension: Secondary | ICD-10-CM | POA: Insufficient documentation

## 2012-11-11 DIAGNOSIS — R1013 Epigastric pain: Secondary | ICD-10-CM

## 2012-11-11 DIAGNOSIS — Z862 Personal history of diseases of the blood and blood-forming organs and certain disorders involving the immune mechanism: Secondary | ICD-10-CM | POA: Insufficient documentation

## 2012-11-11 DIAGNOSIS — Z8639 Personal history of other endocrine, nutritional and metabolic disease: Secondary | ICD-10-CM | POA: Insufficient documentation

## 2012-11-11 DIAGNOSIS — Z8619 Personal history of other infectious and parasitic diseases: Secondary | ICD-10-CM | POA: Insufficient documentation

## 2012-11-11 DIAGNOSIS — E0590 Thyrotoxicosis, unspecified without thyrotoxic crisis or storm: Secondary | ICD-10-CM | POA: Insufficient documentation

## 2012-11-11 DIAGNOSIS — F1210 Cannabis abuse, uncomplicated: Secondary | ICD-10-CM | POA: Insufficient documentation

## 2012-11-11 DIAGNOSIS — Z79899 Other long term (current) drug therapy: Secondary | ICD-10-CM | POA: Insufficient documentation

## 2012-11-11 DIAGNOSIS — K5900 Constipation, unspecified: Secondary | ICD-10-CM | POA: Insufficient documentation

## 2012-11-11 LAB — URINALYSIS, ROUTINE W REFLEX MICROSCOPIC
Nitrite: NEGATIVE
Specific Gravity, Urine: 1.027 (ref 1.005–1.030)
Urobilinogen, UA: 1 mg/dL (ref 0.0–1.0)

## 2012-11-11 LAB — COMPREHENSIVE METABOLIC PANEL
Albumin: 3.6 g/dL (ref 3.5–5.2)
BUN: 9 mg/dL (ref 6–23)
Calcium: 8.9 mg/dL (ref 8.4–10.5)
Creatinine, Ser: 0.9 mg/dL (ref 0.50–1.10)
Total Bilirubin: 0.5 mg/dL (ref 0.3–1.2)
Total Protein: 8.1 g/dL (ref 6.0–8.3)

## 2012-11-11 LAB — MAGNESIUM: Magnesium: 2 mg/dL (ref 1.5–2.5)

## 2012-11-11 LAB — URINE MICROSCOPIC-ADD ON

## 2012-11-11 LAB — LIPASE, BLOOD: Lipase: 22 U/L (ref 11–59)

## 2012-11-11 LAB — PREGNANCY, URINE: Preg Test, Ur: NEGATIVE

## 2012-11-11 MED ORDER — GI COCKTAIL ~~LOC~~
30.0000 mL | Freq: Once | ORAL | Status: AC
  Administered 2012-11-11: 30 mL via ORAL
  Filled 2012-11-11: qty 30

## 2012-11-11 MED ORDER — OMEPRAZOLE 20 MG PO CPDR: 20.0000 mg | DELAYED_RELEASE_CAPSULE | Freq: Every day | ORAL | Status: DC

## 2012-11-11 NOTE — ED Notes (Signed)
Pt. Stated, i was here last week for the same thing. Stomach pain. They told me to take Pepcid , i did and it helped for 2 days and its back again.

## 2012-11-11 NOTE — ED Provider Notes (Signed)
CSN: [id number]     Arrival date & time 11/11/12  1018 History     First MD Initiated Contact with Patient 11/11/12 1108     Chief Complaint  Patient presents with  . Abdominal Pain   (Consider location/radiation/quality/duration/timing/severity/associated sxs/prior Treatment) Patient is a 47 y.o. female presenting with abdominal pain.  Abdominal Pain Associated symptoms: constipation   Associated symptoms: no chest pain, no chills, no cough, no diarrhea, no dysuria, no fatigue, no fever, no nausea, no shortness of breath, no sore throat and no vomiting    Ann Jarvis is a 47 year old female with PMH of HTN, Hyperthyroidism who presents to the ED with complaints of crampy abdominal pain.  She was last seen in the ED on 11/05/12 with complaints of epigastric abdominal pain which responded to GI cocktail and she was discharged with a prescription of pepcid which she reports was working till last night.  She has a follow up appointment with [facility] tomorrow however she reports that she did not want to miss work tonight due to her pain so she came to the ED.  Her current GI pain started last night after eating a small amount salmon at home for dinner, she describes it as crampy and gassy sensation that is diffuse across her abdomen.  She does admit that the pain radiates to her back.  She reports that the pain was not relieved with pepcid and that she has not tried anything else.  She has not eaten this morning. She denies any N/V, denies diarrhea, admits some constipation.   Past Medical History  Diagnosis Date  . Hypertension   . Tobacco abuse   . Cocaine abuse   . Scabies   . Hyperthyroidism   . Grave's disease     s/p radioiodine ablation 03/05/10, F/B Dr. Chestine Spore. On Methimazole  . Thyroid condition    Past Surgical History  Procedure Laterality Date  . Btl    . Mouth surgery     No family history on file. History  Substance Use Topics  . Smoking  status: Current Every Day Smoker -- 0.10 packs/day    Types: Cigarettes  . Smokeless tobacco: Not on file     Comment: 1 cig a day  . Alcohol Use: Yes     Comment: 2-3 beers/night   OB History   Grav Para Term Preterm Abortions TAB SAB Ect Mult Living                 Review of Systems  Constitutional: Negative for fever, chills, activity change and fatigue.  HENT: Negative for sore throat and sinus pressure.   Eyes: Negative for visual disturbance.  Respiratory: Negative for cough, chest tightness, shortness of breath and wheezing.   Cardiovascular: Negative for chest pain, palpitations and leg swelling.  Gastrointestinal: Positive for abdominal pain and constipation. Negative for nausea, vomiting, diarrhea and abdominal distention.  Endocrine: Negative for polyuria.  Genitourinary: Negative for dysuria, urgency and frequency.  Musculoskeletal: Positive for back pain. Negative for myalgias and joint swelling.  Neurological: Negative for dizziness, light-headedness, numbness and headaches.  Hematological: Does not bruise/bleed easily.  Psychiatric/Behavioral: The patient is not nervous/anxious.     Allergies  Penicillins  Home Medications   Current Outpatient Rx  Name  Route  Sig  Dispense  Refill  . diphenhydrAMINE (BENADRYL) 25 MG tablet   Oral   Take 50 mg by mouth every 6 (six) hours as needed for itching.         Marland Kitchen  famotidine (PEPCID) 20 MG tablet   Oral   Take 1 tablet (20 mg total) by mouth 2 (two) times daily.   30 tablet   0   . ibuprofen (ADVIL,MOTRIN) 800 MG tablet   Oral   Take 1 tablet (800 mg total) by mouth 3 (three) times daily.   21 tablet   0   . methimazole (TAPAZOLE) 5 MG tablet   Oral   Take 5 mg by mouth 2 (two) times daily.         . metoprolol succinate (TOPROL-XL) 25 MG 24 hr tablet   Oral   Take 25 mg by mouth every morning.         . simethicone (MYLICON) 80 MG chewable tablet   Oral   Chew 80 mg by mouth every 6 (six) hours  as needed for flatulence.          BP 181/94  Pulse 74  Temp(Src) 97.9 F (36.6 C) (Oral)  Resp 16  SpO2 96%  LMP 10/24/2012 Physical Exam  Nursing note and vitals reviewed. Constitutional: She is oriented to person, place, and time. She appears well-developed and well-nourished. No distress.  HENT:  Head: Normocephalic and atraumatic.  Eyes: Conjunctivae and EOM are normal.  Mild exopthalmos  Neck: Normal range of motion. Neck supple.  Cardiovascular: Normal rate, regular rhythm and normal heart sounds.   No murmur heard. Pulmonary/Chest: Effort normal and breath sounds normal. No respiratory distress. She has no wheezes. She has no rales. She exhibits no tenderness.  Abdominal: Soft. Bowel sounds are normal. There is tenderness (mild RUQ and LUQ tenderness). There is no rebound and no guarding.  Musculoskeletal: She exhibits no edema.  Neurological: She is alert and oriented to person, place, and time.  Skin: Skin is warm and dry. She is not diaphoretic.  Psychiatric: She has a normal mood and affect.    ED Course   Procedures (including critical care time)  Labs Reviewed  URINALYSIS, ROUTINE W REFLEX MICROSCOPIC - Abnormal; Notable for the following:    Color, Urine AMBER (*)    APPearance HAZY (*)    Bilirubin Urine SMALL (*)    Ketones, ur 15 (*)    Protein, ur 30 (*)    All other components within normal limits  URINE MICROSCOPIC-ADD ON - Abnormal; Notable for the following:    Squamous Epithelial / LPF FEW (*)    All other components within normal limits  COMPREHENSIVE METABOLIC PANEL - Abnormal; Notable for the following:    GFR calc non Af Amer 74 (*)    GFR calc Af Amer 86 (*)    All other components within normal limits  LIPASE, BLOOD  PREGNANCY, URINE  MAGNESIUM   No results found. 1. Epigastric abdominal pain     MDM  47 year old female who presents to ED for crampy diffuse abdominal pain since last night.  During patient's ER visit 1 week ago  Lipase and liver enzymes were wnl, Upreg was negative.  Labs were significant for mild hypokalemia and microcytic anemia.  He abdominal exam revealed only mild TTP.  -CMP and lipase to evaluate for GB and pancreatic dysfunction- wnl -Mg- wnl -Upreg- neg -U/A- +ketones and protein.  No nitrates or leukocytes.  May be secondary to fasting or hypokalemia, patient does admit that she consumes alcohol in excess on weekends ketonuria may have component of alcoholism.  K+ and Mg checked and were wnl Patient's abdominal pain unlikely due to GB or pancreatic  source since normal labs and normal bedside ultrasound.  Currently patient's abdominal pain has decreased with GI cocktail.  She reports she still has pepcid and has appointment to follow up with new PCP tomorrow.  Will discharge with instructions to continue to take pepcid for abdominal complaints and return if pain worsens and she has nausea and vomiting associated.  Carlynn Purl, DO 11/11/12 1324

## 2012-11-12 ENCOUNTER — Ambulatory Visit: Payer: Self-pay | Attending: Family Medicine | Admitting: Internal Medicine

## 2012-11-12 VITALS — BP 190/107 | HR 64 | Temp 97.0°F | Resp 17 | Wt 172.8 lb

## 2012-11-12 DIAGNOSIS — E039 Hypothyroidism, unspecified: Secondary | ICD-10-CM | POA: Insufficient documentation

## 2012-11-12 DIAGNOSIS — I10 Essential (primary) hypertension: Secondary | ICD-10-CM | POA: Insufficient documentation

## 2012-11-12 LAB — CBC WITH DIFFERENTIAL/PLATELET
Hemoglobin: 10.7 g/dL — ABNORMAL LOW (ref 12.0–15.0)
Lymphs Abs: 1.6 10*3/uL (ref 0.7–4.0)
Monocytes Relative: 11 % (ref 3–12)
Neutro Abs: 3.5 10*3/uL (ref 1.7–7.7)
Neutrophils Relative %: 59 % (ref 43–77)
RBC: 4.76 MIL/uL (ref 3.87–5.11)

## 2012-11-12 MED ORDER — METHIMAZOLE 5 MG PO TABS: 5.0000 mg | ORAL_TABLET | Freq: Two times a day (BID) | ORAL | Status: DC

## 2012-11-12 MED ORDER — METOPROLOL SUCCINATE ER 25 MG PO TB24: 25.0000 mg | ORAL_TABLET | Freq: Every morning | ORAL | Status: DC

## 2012-11-12 MED ORDER — HYDROCHLOROTHIAZIDE 12.5 MG PO TABS: 25.0000 mg | ORAL_TABLET | Freq: Every day | ORAL | Status: DC

## 2012-11-12 NOTE — Progress Notes (Signed)
Patient here to establish care Has history of HTN Has not taken medicine in a while

## 2012-11-12 NOTE — Progress Notes (Signed)
Patient ID: Ann Jarvis, female   DOB: 06/21/1965, 47 y.o.   MRN: [id number]  CC:  HPI: 47 year old female who presents to the clinic to establish care, has a history of hypothyroidism and is on Tapazole, she has a history of hypertension and is supposed to take metoprolol. The patient has not been taking any of these medications. She denies any ongoing use of cocaine.  She was in the ER yesterday with epigastric pain and was told that she has gastroesophageal reflux disease. Symptoms were relieved with GI cocktail and Pepcid. Patient was provided with a prescription for Pepcid. Symptoms started after she had a piece of salmon. She denies any use of alcohol. She has been advised to take over-the-counter Prilosec  Hypertension Patient has been started on meds hydrochlorothiazide   Allergies  Allergen Reactions  . Penicillins Swelling   Past Medical History  Diagnosis Date  . Hypertension   . Tobacco abuse   . Cocaine abuse   . Scabies   . Hyperthyroidism   . Grave's disease     s/p radioiodine ablation 03/05/10, F/B Dr. Chestine Spore. On Methimazole  . Thyroid condition    Current Outpatient Prescriptions on File Prior to Visit  Medication Sig Dispense Refill  . famotidine (PEPCID) 20 MG tablet Take 1 tablet (20 mg total) by mouth 2 (two) times daily.  30 tablet  0  . omeprazole (PRILOSEC) 20 MG capsule Take 1 capsule (20 mg total) by mouth daily.  14 capsule  0   No current facility-administered medications on file prior to visit.   History reviewed. No pertinent family history. History   Social History  . Marital Status: Single    Spouse Name: N/A    Number of Children: N/A  . Years of Education: N/A   Occupational History  . Not on file.   Social History Main Topics  . Smoking status: Current Every Day Smoker -- 0.10 packs/day    Types: Cigarettes  . Smokeless tobacco: Not on file     Comment: 1 cig a day  . Alcohol Use: Yes     Comment: 2-3 beers/night  . Drug Use:  Yes     Comment: marijuana  . Sexual Activity: Not on file   Other Topics Concern  . Not on file   Social History Narrative   Smokes 1 pack/week x 20 years, smokes marijuna, no cocaine in the last year.  Works for [employer].      Financial assistance approved for 100% discount at [facility] and has [facility] card Rudell Cobb Aug 01 2009 8.58am   Financial assistance approved for 100% discount at [facility] and has [facility] card Gavin Pound February 02, 2010 12.20pm    Review of Systems  Constitutional: Negative for fever, chills, diaphoresis, activity change, appetite change and fatigue.  HENT: Negative for ear pain, nosebleeds, congestion, facial swelling, rhinorrhea, neck pain, neck stiffness and ear discharge.   Eyes: Negative for pain, discharge, redness, itching and visual disturbance.  Respiratory: Negative for cough, choking, chest tightness, shortness of breath, wheezing and stridor.   Cardiovascular: Negative for chest pain, palpitations and leg swelling.  Gastrointestinal: Negative for abdominal distention.  Genitourinary: Negative for dysuria, urgency, frequency, hematuria, flank pain, decreased urine volume, difficulty urinating and dyspareunia.  Musculoskeletal: Negative for back pain, joint swelling, arthralgias and gait problem.  Neurological: Negative for dizziness, tremors, seizures, syncope, facial asymmetry, speech difficulty, weakness, light-headedness, numbness and headaches.  Hematological: Negative for adenopathy. Does not bruise/bleed easily.  Psychiatric/Behavioral: Negative for  hallucinations, behavioral problems, confusion, dysphoric mood, decreased concentration and agitation.    Objective:   Filed Vitals:   11/12/12 1624  BP: 190/107  Pulse: 64  Temp: 97 F (36.1 C)  Resp: 17    Physical Exam  Constitutional: Appears well-developed and well-nourished. No distress.  HENT: Normocephalic. External right and left ear normal. Oropharynx is clear and moist.  Eyes:  Conjunctivae and EOM are normal. PERRLA, no scleral icterus.  Neck: Normal ROM. Neck supple. No JVD. No tracheal deviation. No thyromegaly.  CVS: RRR, S1/S2 +, no murmurs, no gallops, no carotid bruit.  Pulmonary: Effort and breath sounds normal, no stridor, rhonchi, wheezes, rales.  Abdominal: Soft. BS +,  no distension, tenderness, rebound or guarding.  Musculoskeletal: Normal range of motion. No edema and no tenderness.  Lymphadenopathy: No lymphadenopathy noted, cervical, inguinal. Neuro: Alert. Normal reflexes, muscle tone coordination. No cranial nerve deficit. Skin: Skin is warm and dry. No rash noted. Not diaphoretic. No erythema. No pallor.  Psychiatric: Normal mood and affect. Behavior, judgment, thought content normal.   Lab Results  Component Value Date   WBC 6.7 11/05/2012   HGB 10.5* 11/05/2012   HCT 32.7* 11/05/2012   MCV 71.6* 11/05/2012   PLT 244 11/05/2012   Lab Results  Component Value Date   CREATININE 0.90 11/11/2012   BUN 9 11/11/2012   NA 135 11/11/2012   K 3.5 11/11/2012   CL 100 11/11/2012   CO2 26 11/11/2012    No results found for this basename: HGBA1C   Lipid Panel     Component Value Date/Time   CHOL 150 10/10/2008 2127   TRIG 136 10/10/2008 2127   HDL 65 10/10/2008 2127   CHOLHDL 2.3 Ratio 10/10/2008 2127   VLDL 27 10/10/2008 2127   LDLCALC 58 10/10/2008 2127       Assessment and plan:   Patient Active Problem List   Diagnosis Date Noted  . Bell's palsy 10/09/2011  . DENTAL PAIN 08/01/2009  . PRURITUS 03/07/2009  . TOBACCO ABUSE 10/10/2008  . COCAINE ABUSE 08/17/2007  . Essential hypertension, benign 08/17/2007  . ABSCESS, TOOTH 08/17/2007  . CHEST PAIN UNSPECIFIED 08/17/2007  . SHOULDER PAIN, LEFT 07/27/2007  . GRAVE'S DISEASE 12/18/2006       Hypertension Patient has been started on hydrochlorothiazide She will continue with metoprolol Baseline labs Followup in one week for repeat evaluation of her blood pressure    History of cocaine  abuse Obtain a UDS   Hypothyroidism Will check TSH and resume Tapazole, and reassess dosage on followup visit

## 2012-11-12 NOTE — ED Provider Notes (Signed)
Medical screening examination/treatment/procedure(s) were conducted as a shared visit with non-physician practitioner(s) or resident and myself. I personally evaluated the patient during the encounter and agree with the findings and plan unless otherwise indicated.  Mild epig pain on exam, intermittent, resolves with acid medicines. Similar to previous. Soft, minimal epig tenderness. Bedside US no stones or wall thickening. Pt improved in ED. Pt drinks regular etoh/ energy drinks. Minimal nsaids. Fup discussed.  Labs Reviewed   URINALYSIS, ROUTINE W REFLEX MICROSCOPIC - Abnormal; Notable for the following:    Color, Urine  AMBER (*)     APPearance  HAZY (*)     Bilirubin Urine  SMALL (*)     Ketones, ur  15 (*)     Protein, ur  30 (*)     All other components within normal limits   URINE MICROSCOPIC-ADD ON - Abnormal; Notable for the following:    Squamous Epithelial / LPF  FEW (*)     All other components within normal limits   COMPREHENSIVE METABOLIC PANEL - Abnormal; Notable for the following:    GFR calc non Af Amer  74 (*)     GFR calc Af Amer  86 (*)     All other components within normal limits   LIPASE, BLOOD   PREGNANCY, URINE   MAGNESIUM    PROCEDURES  EMERGENCY DEPARTMENT BILIARY ULTRASOUND INTERPRETATION  "Study: Limited Abdominal Ultrasound of the gallbladder and common bile duct."  INDICATIONS: Abdominal pain and Nausea  Indication: Multiple views of the gallbladder and common bile duct were obtained in real-time with a Multi-frequency probe."  PERFORMED BY: Myself  IMAGES ARCHIVED?: Yes  FINDINGS: Gallstones absent, Gallbladder wall normal in thickness and Sonographic Murphy's sign absent  LIMITATIONS: Bowel Gas  INTERPRETATION: Normal      Enid Skeens, MD 11/12/12 2142

## 2012-11-13 LAB — URINALYSIS
Glucose, UA: NEGATIVE mg/dL
Ketones, ur: NEGATIVE mg/dL
Leukocytes, UA: NEGATIVE
Nitrite: NEGATIVE
Specific Gravity, Urine: 1.027 (ref 1.005–1.030)
pH: 6.5 (ref 5.0–8.0)

## 2012-11-13 LAB — HCG, QUANTITATIVE, PREGNANCY: hCG, Beta Chain, Quant, S: <2 m[IU]/mL

## 2012-11-13 LAB — TSH: TSH: 4.96 u[IU]/mL — ABNORMAL HIGH (ref 0.350–4.500)

## 2012-11-19 ENCOUNTER — Ambulatory Visit: Payer: Self-pay | Attending: Internal Medicine | Admitting: Internal Medicine

## 2012-11-19 VITALS — BP 178/98 | HR 85 | Temp 97.7°F | Resp 16 | Wt 166.2 lb

## 2012-11-19 DIAGNOSIS — I10 Essential (primary) hypertension: Secondary | ICD-10-CM | POA: Insufficient documentation

## 2012-11-19 DIAGNOSIS — E0590 Thyrotoxicosis, unspecified without thyrotoxic crisis or storm: Secondary | ICD-10-CM | POA: Insufficient documentation

## 2012-11-19 DIAGNOSIS — E039 Hypothyroidism, unspecified: Secondary | ICD-10-CM

## 2012-11-19 NOTE — Progress Notes (Signed)
Patient ID: Ann Jarvis, female   DOB: 1965-11-06, 47 y.o.   MRN: [id number]   CC:  HPI: 47 year old female with a history of hyperactive thyroid status post radioiodine ablation, was on Tapazole, repeat TSH is 4.960. patient is clinically asymptomatic. Still hypertensive, systolic 178, and she has not filled any of her antihypertensive medications. She has a history of cocaine abuse, UDS ordered on 8/14 was not reported Trying to obtain another UDS today.   Allergies  Allergen Reactions  . Penicillins Swelling   Past Medical History  Diagnosis Date  . Hypertension   . Tobacco abuse   . Cocaine abuse   . Scabies   . Hyperthyroidism   . Grave's disease     s/p radioiodine ablation 03/05/10, F/B Dr. Chestine Spore. On Methimazole  . Thyroid condition    Current Outpatient Prescriptions on File Prior to Visit  Medication Sig Dispense Refill  . famotidine (PEPCID) 20 MG tablet Take 1 tablet (20 mg total) by mouth 2 (two) times daily.  30 tablet  0  . hydrochlorothiazide (HYDRODIURIL) 12.5 MG tablet Take 2 tablets (25 mg total) by mouth daily.  90 tablet  3  . metoprolol succinate (TOPROL-XL) 25 MG 24 hr tablet Take 1 tablet (25 mg total) by mouth every morning.  60 tablet  2  . omeprazole (PRILOSEC) 20 MG capsule Take 1 capsule (20 mg total) by mouth daily.  14 capsule  0   No current facility-administered medications on file prior to visit.   History reviewed. No pertinent family history. History   Social History  . Marital Status: Single    Spouse Name: N/A    Number of Children: N/A  . Years of Education: N/A   Occupational History  . Not on file.   Social History Main Topics  . Smoking status: Current Every Day Smoker -- 0.10 packs/day    Types: Cigarettes  . Smokeless tobacco: Not on file     Comment: 1 cig a day  . Alcohol Use: Yes     Comment: 2-3 beers/night  . Drug Use: Yes     Comment: marijuana  . Sexual Activity: Not on file   Other Topics Concern  . Not  on file   Social History Narrative   Smokes 1 pack/week x 20 years, smokes marijuna, no cocaine in the last year.  Works for [employer].      Financial assistance approved for 100% discount at [facility] and has [facility] card Rudell Cobb Aug 01 2009 8.58am   Financial assistance approved for 100% discount at [facility] and has [facility] card Gavin Pound February 02, 2010 12.20pm    Review of Systems  Constitutional: Negative for fever, chills, diaphoresis, activity change, appetite change and fatigue.  HENT: Negative for ear pain, nosebleeds, congestion, facial swelling, rhinorrhea, neck pain, neck stiffness and ear discharge.   Eyes: Negative for pain, discharge, redness, itching and visual disturbance.  Respiratory: Negative for cough, choking, chest tightness, shortness of breath, wheezing and stridor.   Cardiovascular: Negative for chest pain, palpitations and leg swelling.  Gastrointestinal: Negative for abdominal distention.  Genitourinary: Negative for dysuria, urgency, frequency, hematuria, flank pain, decreased urine volume, difficulty urinating and dyspareunia.  Musculoskeletal: Negative for back pain, joint swelling, arthralgias and gait problem.  Neurological: Negative for dizziness, tremors, seizures, syncope, facial asymmetry, speech difficulty, weakness, light-headedness, numbness and headaches.  Hematological: Negative for adenopathy. Does not bruise/bleed easily.  Psychiatric/Behavioral: Negative for hallucinations, behavioral problems, confusion, dysphoric mood, decreased concentration and agitation.  Objective:   Filed Vitals:   11/19/12 1720  BP: 178/98  Pulse: 85  Temp: 97.7 F (36.5 C)  Resp: 16    Physical Exam  Constitutional: Appears well-developed and well-nourished. No distress.  HENT: Normocephalic. External right and left ear normal. Oropharynx is clear and moist.  Eyes: Conjunctivae and EOM are normal. PERRLA, no scleral icterus.  Neck: Normal ROM. Neck supple. No  JVD. No tracheal deviation. No thyromegaly.  CVS: RRR, S1/S2 +, no murmurs, no gallops, no carotid bruit.  Pulmonary: Effort and breath sounds normal, no stridor, rhonchi, wheezes, rales.  Abdominal: Soft. BS +,  no distension, tenderness, rebound or guarding.  Musculoskeletal: Normal range of motion. No edema and no tenderness.  Lymphadenopathy: No lymphadenopathy noted, cervical, inguinal. Neuro: Alert. Normal reflexes, muscle tone coordination. No cranial nerve deficit. Skin: Skin is warm and dry. No rash noted. Not diaphoretic. No erythema. No pallor.  Psychiatric: Normal mood and affect. Behavior, judgment, thought content normal.   Lab Results  Component Value Date   WBC 5.8 11/12/2012   HGB 10.7* 11/12/2012   HCT 34.2* 11/12/2012   MCV 71.8* 11/12/2012   PLT 282 11/12/2012   Lab Results  Component Value Date   CREATININE 0.90 11/11/2012   BUN 9 11/11/2012   NA 135 11/11/2012   K 3.5 11/11/2012   CL 100 11/11/2012   CO2 26 11/11/2012    No results found for this basename: HGBA1C   Lipid Panel     Component Value Date/Time   CHOL 150 10/10/2008 2127   TRIG 136 10/10/2008 2127   HDL 65 10/10/2008 2127   CHOLHDL 2.3 Ratio 10/10/2008 2127   VLDL 27 10/10/2008 2127   LDLCALC 58 10/10/2008 2127       Assessment and plan:   Patient Active Problem List   Diagnosis Date Noted  . Bell's palsy 10/09/2011  . DENTAL PAIN 08/01/2009  . PRURITUS 03/07/2009  . TOBACCO ABUSE 10/10/2008  . COCAINE ABUSE 08/17/2007  . Essential hypertension, benign 08/17/2007  . ABSCESS, TOOTH 08/17/2007  . CHEST PAIN UNSPECIFIED 08/17/2007  . SHOULDER PAIN, LEFT 07/27/2007  . GRAVE'S DISEASE 12/18/2006      Hypertension Patient has not filled her HCTZ on her metoprolol unfortunately Medication compliance emphasized  History of cocaine use Have asked her to provide another UDS sample  Hyperthyroidism  It appears that currently she is euthyroid, Tapazole discontinued We'll check a free T4 and free  T3 Within normal limits the patient will need a repeat parents option testing in 6 weeks to confirm

## 2012-11-19 NOTE — Progress Notes (Signed)
Follow up Patient has still not taken her blood pressure medication Will pick it up tomorrow when she gets paid

## 2012-11-20 LAB — T3, FREE: T3, Free: 2.2 pg/mL — ABNORMAL LOW (ref 2.3–4.2)

## 2012-11-20 LAB — T4, FREE: Free T4: 0.82 ng/dL (ref 0.80–1.80)

## 2013-04-05 ENCOUNTER — Encounter (HOSPITAL_COMMUNITY): Payer: Self-pay | Admitting: Emergency Medicine

## 2013-04-05 ENCOUNTER — Emergency Department (HOSPITAL_COMMUNITY)
Admission: EM | Admit: 2013-04-05 | Discharge: 2013-04-06 | Discharge status: Against Medical Advice | Payer: Self-pay | Attending: Emergency Medicine | Admitting: Emergency Medicine

## 2013-04-05 DIAGNOSIS — R197 Diarrhea, unspecified: Secondary | ICD-10-CM | POA: Insufficient documentation

## 2013-04-05 DIAGNOSIS — Z79899 Other long term (current) drug therapy: Secondary | ICD-10-CM | POA: Insufficient documentation

## 2013-04-05 DIAGNOSIS — F17200 Nicotine dependence, unspecified, uncomplicated: Secondary | ICD-10-CM | POA: Insufficient documentation

## 2013-04-05 DIAGNOSIS — I10 Essential (primary) hypertension: Secondary | ICD-10-CM | POA: Insufficient documentation

## 2013-04-05 DIAGNOSIS — R112 Nausea with vomiting, unspecified: Secondary | ICD-10-CM | POA: Insufficient documentation

## 2013-04-05 DIAGNOSIS — E0590 Thyrotoxicosis, unspecified without thyrotoxic crisis or storm: Secondary | ICD-10-CM | POA: Insufficient documentation

## 2013-04-05 DIAGNOSIS — J069 Acute upper respiratory infection, unspecified: Secondary | ICD-10-CM | POA: Insufficient documentation

## 2013-04-05 DIAGNOSIS — F1410 Cocaine abuse, uncomplicated: Secondary | ICD-10-CM | POA: Insufficient documentation

## 2013-04-05 LAB — COMPREHENSIVE METABOLIC PANEL
ALBUMIN: 3.4 g/dL — AB (ref 3.5–5.2)
ALK PHOS: 56 U/L (ref 39–117)
ALT: 9 U/L (ref 0–35)
AST: 14 U/L (ref 0–37)
BILIRUBIN TOTAL: 0.5 mg/dL (ref 0.3–1.2)
BUN: 9 mg/dL (ref 6–23)
CO2: 24 mEq/L (ref 19–32)
CREATININE: 0.91 mg/dL (ref 0.50–1.10)
Calcium: 8.9 mg/dL (ref 8.4–10.5)
Chloride: 100 mEq/L (ref 96–112)
GFR calc non Af Amer: 73 mL/min — ABNORMAL LOW (ref >90)
GFR, EST AFRICAN AMERICAN: 84 mL/min — AB (ref >90)
GLUCOSE: 93 mg/dL (ref 70–99)
POTASSIUM: 3.7 meq/L (ref 3.7–5.3)
Sodium: 136 mEq/L — ABNORMAL LOW (ref 137–147)
TOTAL PROTEIN: 7.8 g/dL (ref 6.0–8.3)

## 2013-04-05 LAB — CBC WITH DIFFERENTIAL/PLATELET
BASOS PCT: 0 % (ref 0–1)
Basophils Absolute: 0 10*3/uL (ref 0.0–0.1)
EOS ABS: 0.1 10*3/uL (ref 0.0–0.7)
Eosinophils Relative: 1 % (ref 0–5)
HEMATOCRIT: 34.4 % — AB (ref 36.0–46.0)
HEMOGLOBIN: 11 g/dL — AB (ref 12.0–15.0)
LYMPHS ABS: 1.3 10*3/uL (ref 0.7–4.0)
Lymphocytes Relative: 15 % (ref 12–46)
MCH: 23.4 pg — AB (ref 26.0–34.0)
MCHC: 32 g/dL (ref 30.0–36.0)
MCV: 73 fL — AB (ref 78.0–100.0)
MONO ABS: 0.8 10*3/uL (ref 0.1–1.0)
MONOS PCT: 9 % (ref 3–12)
NEUTROS PCT: 75 % (ref 43–77)
Neutro Abs: 6.7 10*3/uL (ref 1.7–7.7)
Platelets: 257 10*3/uL (ref 150–400)
RBC: 4.71 MIL/uL (ref 3.87–5.11)
RDW: 15.3 % (ref 11.5–15.5)
WBC: 8.9 10*3/uL (ref 4.0–10.5)

## 2013-04-05 NOTE — ED Notes (Signed)
Pt c/o N/V/D x 2 days with URI sx

## 2013-04-05 NOTE — ED Notes (Signed)
Unable to locate pt after multiple attempts. 

## 2013-04-05 NOTE — ED Notes (Signed)
Attempted to locate pt - multiple calls to locate pt w/ no response.

## 2013-04-05 NOTE — ED Notes (Signed)
Attempted to locate pt - called name x3 - no response

## 2013-04-10 ENCOUNTER — Emergency Department (HOSPITAL_COMMUNITY)
Admission: EM | Admit: 2013-04-10 | Discharge: 2013-04-10 | Disposition: A | Discharge status: Home / Self Care | Payer: Self-pay | Attending: Emergency Medicine | Admitting: Emergency Medicine

## 2013-04-10 ENCOUNTER — Encounter (HOSPITAL_COMMUNITY): Payer: Self-pay | Admitting: Emergency Medicine

## 2013-04-10 DIAGNOSIS — R1110 Vomiting, unspecified: Secondary | ICD-10-CM

## 2013-04-10 DIAGNOSIS — Z8639 Personal history of other endocrine, nutritional and metabolic disease: Secondary | ICD-10-CM | POA: Insufficient documentation

## 2013-04-10 DIAGNOSIS — Z8619 Personal history of other infectious and parasitic diseases: Secondary | ICD-10-CM | POA: Insufficient documentation

## 2013-04-10 DIAGNOSIS — R112 Nausea with vomiting, unspecified: Secondary | ICD-10-CM | POA: Insufficient documentation

## 2013-04-10 DIAGNOSIS — R197 Diarrhea, unspecified: Secondary | ICD-10-CM | POA: Insufficient documentation

## 2013-04-10 DIAGNOSIS — R109 Unspecified abdominal pain: Secondary | ICD-10-CM

## 2013-04-10 DIAGNOSIS — Z880 Allergy status to penicillin: Secondary | ICD-10-CM | POA: Insufficient documentation

## 2013-04-10 DIAGNOSIS — R1084 Generalized abdominal pain: Secondary | ICD-10-CM | POA: Insufficient documentation

## 2013-04-10 DIAGNOSIS — R011 Cardiac murmur, unspecified: Secondary | ICD-10-CM | POA: Insufficient documentation

## 2013-04-10 DIAGNOSIS — I10 Essential (primary) hypertension: Secondary | ICD-10-CM | POA: Insufficient documentation

## 2013-04-10 DIAGNOSIS — Z3202 Encounter for pregnancy test, result negative: Secondary | ICD-10-CM | POA: Insufficient documentation

## 2013-04-10 DIAGNOSIS — F17200 Nicotine dependence, unspecified, uncomplicated: Secondary | ICD-10-CM | POA: Insufficient documentation

## 2013-04-10 DIAGNOSIS — Z862 Personal history of diseases of the blood and blood-forming organs and certain disorders involving the immune mechanism: Secondary | ICD-10-CM | POA: Insufficient documentation

## 2013-04-10 LAB — COMPREHENSIVE METABOLIC PANEL
ALK PHOS: 62 U/L (ref 39–117)
ALT: 12 U/L (ref 0–35)
AST: 19 U/L (ref 0–37)
Albumin: 3.7 g/dL (ref 3.5–5.2)
BUN: 12 mg/dL (ref 6–23)
CALCIUM: 8.9 mg/dL (ref 8.4–10.5)
CO2: 22 meq/L (ref 19–32)
Chloride: 96 mEq/L (ref 96–112)
Creatinine, Ser: 0.91 mg/dL (ref 0.50–1.10)
GFR calc Af Amer: 84 mL/min — ABNORMAL LOW (ref >90)
GFR calc non Af Amer: 73 mL/min — ABNORMAL LOW (ref >90)
Glucose, Bld: 101 mg/dL — ABNORMAL HIGH (ref 70–99)
POTASSIUM: 3.5 meq/L — AB (ref 3.7–5.3)
SODIUM: 137 meq/L (ref 137–147)
TOTAL PROTEIN: 8.3 g/dL (ref 6.0–8.3)
Total Bilirubin: 0.5 mg/dL (ref 0.3–1.2)

## 2013-04-10 LAB — CBC WITH DIFFERENTIAL/PLATELET
BASOS PCT: 0 % (ref 0–1)
Basophils Absolute: 0.1 10*3/uL (ref 0.0–0.1)
EOS ABS: 0 10*3/uL (ref 0.0–0.7)
EOS PCT: 0 % (ref 0–5)
HEMATOCRIT: 40.6 % (ref 36.0–46.0)
HEMOGLOBIN: 13.7 g/dL (ref 12.0–15.0)
Lymphocytes Relative: 16 % (ref 12–46)
Lymphs Abs: 1.8 10*3/uL (ref 0.7–4.0)
MCH: 24.3 pg — AB (ref 26.0–34.0)
MCHC: 33.7 g/dL (ref 30.0–36.0)
MCV: 72 fL — AB (ref 78.0–100.0)
MONO ABS: 0.7 10*3/uL (ref 0.1–1.0)
MONOS PCT: 6 % (ref 3–12)
Neutro Abs: 8.6 10*3/uL — ABNORMAL HIGH (ref 1.7–7.7)
Neutrophils Relative %: 78 % — ABNORMAL HIGH (ref 43–77)
Platelets: 281 10*3/uL (ref 150–400)
RBC: 5.64 MIL/uL — ABNORMAL HIGH (ref 3.87–5.11)
RDW: 15.1 % (ref 11.5–15.5)
WBC: 11.2 10*3/uL — ABNORMAL HIGH (ref 4.0–10.5)

## 2013-04-10 LAB — URINALYSIS, ROUTINE W REFLEX MICROSCOPIC
GLUCOSE, UA: NEGATIVE mg/dL
Leukocytes, UA: NEGATIVE
Nitrite: NEGATIVE
PROTEIN: 100 mg/dL — AB
Specific Gravity, Urine: 1.027 (ref 1.005–1.030)
Urobilinogen, UA: 0.2 mg/dL (ref 0.0–1.0)
pH: 5.5 (ref 5.0–8.0)

## 2013-04-10 LAB — URINE MICROSCOPIC-ADD ON

## 2013-04-10 LAB — POCT PREGNANCY, URINE: Preg Test, Ur: NEGATIVE

## 2013-04-10 LAB — LIPASE, BLOOD: Lipase: 14 U/L (ref 11–59)

## 2013-04-10 LAB — POCT I-STAT TROPONIN I: TROPONIN I, POC: 0 ng/mL (ref 0.00–0.08)

## 2013-04-10 MED ORDER — ONDANSETRON HCL 4 MG PO TABS: 4.0000 mg | ORAL_TABLET | Freq: Four times a day (QID) | ORAL | Status: DC

## 2013-04-10 MED ORDER — NAPROXEN 375 MG PO TABS: 375.0000 mg | ORAL_TABLET | Freq: Two times a day (BID) | ORAL | Status: DC

## 2013-04-10 MED ORDER — SODIUM CHLORIDE 0.9 % IV BOLUS (SEPSIS)
1000.0000 mL | Freq: Once | INTRAVENOUS | Status: AC
  Administered 2013-04-10: 1000 mL via INTRAVENOUS

## 2013-04-10 MED ORDER — LOPERAMIDE HCL 2 MG PO CAPS: 2.0000 mg | ORAL_CAPSULE | Freq: Four times a day (QID) | ORAL | Status: DC | PRN

## 2013-04-10 MED ORDER — MORPHINE SULFATE 4 MG/ML IJ SOLN
4.0000 mg | Freq: Once | INTRAMUSCULAR | Status: AC
  Administered 2013-04-10: 4 mg via INTRAVENOUS
  Filled 2013-04-10: qty 1

## 2013-04-10 MED ORDER — ONDANSETRON HCL 4 MG/2ML IJ SOLN
4.0000 mg | Freq: Once | INTRAMUSCULAR | Status: AC
  Administered 2013-04-10: 4 mg via INTRAVENOUS
  Filled 2013-04-10: qty 2

## 2013-04-10 NOTE — Discharge Instructions (Signed)
Abdominal Pain, Adult Many things can cause abdominal pain. Usually, abdominal pain is not caused by a disease and will improve without treatment. It can often be observed and treated at home. Your health care provider will do a physical exam and possibly order blood tests and X-rays to help determine the seriousness of your pain. However, in many cases, more time must pass before a clear cause of the pain can be found. Before that point, your health care provider may not know if you need more testing or further treatment. HOME CARE INSTRUCTIONS  Monitor your abdominal pain for any changes. The following actions may help to alleviate any discomfort you are experiencing:  Only take over-the-counter or prescription medicines as directed by your health care provider.  Do not take laxatives unless directed to do so by your health care provider.  Try a clear liquid diet (broth, tea, or water) as directed by your health care provider. Slowly move to a bland diet as tolerated. SEEK MEDICAL CARE IF:  You have unexplained abdominal pain.  You have abdominal pain associated with nausea or diarrhea.  You have pain when you urinate or have a bowel movement.  You experience abdominal pain that wakes you in the night.  You have abdominal pain that is worsened or improved by eating food.  You have abdominal pain that is worsened with eating fatty foods. SEEK IMMEDIATE MEDICAL CARE IF:   Your pain does not go away within 2 hours.  You have a fever.  You keep throwing up (vomiting).  Your pain is felt only in portions of the abdomen, such as the right side or the left lower portion of the abdomen.  You pass bloody or black tarry stools. MAKE SURE YOU:  Understand these instructions.   Will watch your condition.   Will get help right away if you are not doing well or get worse.  Document Released: 12/26/2004 Document Revised: 01/06/2013 Document Reviewed: 11/25/2012 [facility] Patient  Information 2014 [place].  Diarrhea Diarrhea is watery poop (stool). It can make you feel weak, tired, thirsty, or give you a dry mouth (signs of dehydration). Watery poop is a sign of another problem, most often an infection. It often lasts 2 3 days. It can last longer if it is a sign of something serious. Take care of yourself as told by your doctor. HOME CARE   Drink 1 cup (8 ounces) of fluid each time you have watery poop.  Do not drink the following fluids:  Those that contain simple sugars (fructose, glucose, galactose, lactose, sucrose, maltose).  Sports drinks.  Fruit juices.  Whole milk products.  Sodas.  Drinks with caffeine (coffee, tea, soda) or alcohol.  Oral rehydration solution may be used if the doctor says it is okay. You may make your own solution. Follow this recipe:    teaspoon table salt.   teaspoon baking soda.   teaspoon salt substitute containing potassium chloride.  1 tablespoons sugar.  1 liter (34 ounces) of water.  Avoid the following foods:  High fiber foods, such as raw fruits and vegetables.  Nuts, seeds, and whole grain breads and cereals.   Those that are sweetened with sugar alcohols (xylitol, sorbitol, mannitol).  Try eating the following foods:  Starchy foods, such as rice, toast, pasta, low-sugar cereal, oatmeal, baked potatoes, crackers, and bagels.  Bananas.  Applesauce.  Eat probiotic-rich foods, such as yogurt and milk products that are fermented.  Wash your hands well after each time you have  watery poop.  Only take medicine as told by your doctor.  Take a warm bath to help lessen burning or pain from having watery poop. GET HELP RIGHT AWAY IF:   You cannot drink fluids without throwing up (vomiting).  You keep throwing up.  You have blood in your poop, or your poop looks black and tarry.  You do not pee (urinate) in 6 8 hours, or there is only a small amount of very dark pee.  You have belly  (abdominal) pain that gets worse or stays in the same spot (localizes).  You are weak, dizzy, confused, or lightheaded.  You have a very bad headache.  Your watery poop gets worse or does not get better.  You have a fever or lasting symptoms for more than 2 3 days.  You have a fever and your symptoms suddenly get worse. MAKE SURE YOU:   Understand these instructions.  Will watch your condition.  Will get help right away if you are not doing well or get worse. Document Released: 09/04/2007 Document Revised: 12/11/2011 Document Reviewed: 11/24/2011 [facility] Patient Information 2014 [place], [place].  Nausea and Vomiting Nausea means you feel sick to your stomach. Throwing up (vomiting) is a reflex where stomach contents come out of your mouth. HOME CARE   Take medicine as told by your doctor.  Do not force yourself to eat. However, you do need to drink fluids.  If you feel like eating, eat a normal diet as told by your doctor.  Eat rice, wheat, potatoes, bread, lean meats, yogurt, fruits, and vegetables.  Avoid high-fat foods.  Drink enough fluids to keep your pee (urine) clear or pale yellow.  Ask your doctor how to replace body fluid losses (rehydrate). Signs of body fluid loss (dehydration) include:  Feeling very thirsty.  Dry lips and mouth.  Feeling dizzy.  Dark pee.  Peeing less than normal.  Feeling confused.  Fast breathing or heart rate. GET HELP RIGHT AWAY IF:   You have blood in your throw up.  You have black or bloody poop (stool).  You have a bad headache or stiff neck.  You feel confused.  You have bad belly (abdominal) pain.  You have chest pain or trouble breathing.  You do not pee at least once every 8 hours.  You have cold, clammy skin.  You keep throwing up after 24 to 48 hours.  You have a fever. MAKE SURE YOU:   Understand these instructions.  Will watch your condition.  Will get help right away if you are not doing well  or get worse. Document Released: 09/04/2007 Document Revised: 06/10/2011 Document Reviewed: 08/17/2010 [facility] Patient Information 2014 [place], [place].

## 2013-04-10 NOTE — ED Provider Notes (Signed)
CSN: [id number]     Arrival date & time 04/10/13  1204 History   First MD Initiated Contact with Patient 04/10/13 1212     Chief Complaint  Patient presents with  . Abdominal Pain  . Emesis  . Diarrhea   (Consider location/radiation/quality/duration/timing/severity/associated sxs/prior Treatment) HPI Comments: 48 yo AA female presents with 1 week h/o n/v/d and abd pain.  Pt came to ER earlier in January, but left prior to being seen.  Pt states the symptoms "got better", but then came back again.  She was t/s to ER via EMS.    Pt lives with her boyfriend.  She denies surgeries to her abdomen.   Pt denies vb, vd, lower abd pain.  Pt denies pregnancy. She reports normal menses.  LMP in late December.    Patient is a 48 y.o. female presenting with abdominal pain, vomiting, and diarrhea. The history is provided by the patient.  Abdominal Pain Pain location:  Generalized Pain quality: aching   Pain quality: not bloating, not burning, not cramping, not dull, no fullness, not gnawing, not heavy, no pressure, not sharp, not shooting, not squeezing and not stabbing   Pain radiates to:  Does not radiate Pain severity:  Severe Onset quality:  Gradual Duration:  1 week Timing:  Constant Progression:  Waxing and waning Chronicity:  Recurrent Context: not alcohol use, not awakening from sleep, not diet changes, not eating, not laxative use, not medication withdrawal, not previous surgeries, not recent illness, not recent sexual activity, not recent travel, not retching, not sick contacts, not suspicious food intake and not trauma   Relieved by:  Nothing Worsened by:  Nothing tried Associated symptoms: diarrhea, nausea and vomiting   Associated symptoms: no anorexia, no belching, no chest pain, no chills, no constipation, no cough, no dysuria, no fatigue, no fever, no flatus, no hematemesis, no hematochezia, no hematuria, no melena, no shortness of breath, no sore throat, no vaginal bleeding and no  vaginal discharge   Emesis Associated symptoms: abdominal pain and diarrhea   Associated symptoms: no chills and no sore throat   Diarrhea Associated symptoms: abdominal pain and vomiting   Associated symptoms: no chills and no fever     Past Medical History  Diagnosis Date  . Hypertension   . Tobacco abuse   . Cocaine abuse   . Scabies   . Hyperthyroidism   . Grave's disease     s/p radioiodine ablation 03/05/10, F/B Dr. Chestine Spore. On Methimazole  . Thyroid condition    Past Surgical History  Procedure Laterality Date  . Btl    . Mouth surgery     No family history on file. History  Substance Use Topics  . Smoking status: Current Every Day Smoker -- 0.10 packs/day    Types: Cigarettes  . Smokeless tobacco: Not on file     Comment: 1 cig a day  . Alcohol Use: Yes     Comment: 2-3 beers on weekend   OB History   Grav Para Term Preterm Abortions TAB SAB Ect Mult Living                 Review of Systems  Constitutional: Negative.  Negative for fever, chills, activity change, appetite change and fatigue.  HENT: Negative.  Negative for congestion and sore throat.   Eyes: Negative.   Respiratory: Negative for apnea, cough, choking, chest tightness and shortness of breath.   Cardiovascular: Negative for chest pain and leg swelling.  Gastrointestinal: Positive for nausea,  vomiting, abdominal pain and diarrhea. Negative for constipation, blood in stool, melena, hematochezia, abdominal distention, anal bleeding, rectal pain, anorexia, flatus and hematemesis.  Endocrine: Negative.   Genitourinary: Negative for dysuria, urgency, frequency, hematuria, flank pain, decreased urine volume, vaginal bleeding, vaginal discharge, enuresis, genital sores, menstrual problem and pelvic pain.  Neurological: Negative.   All other systems reviewed and are negative.    Allergies  Penicillins  Home Medications   Current Outpatient Rx  Name  Route  Sig  Dispense  Refill  .  Phenylephrine-APAP-Guaifenesin (MUCINEX FAST-MAX COLD & SINUS PO)   Oral   Take 30 mLs by mouth every 4 (four) hours as needed (cold).         Marland Kitchen. loperamide (IMODIUM) 2 MG capsule   Oral   Take 1 capsule (2 mg total) by mouth 4 (four) times daily as needed for diarrhea or loose stools.   12 capsule   0   . naproxen (NAPROSYN) 375 MG tablet   Oral   Take 1 tablet (375 mg total) by mouth 2 (two) times daily.   20 tablet   0   . ondansetron (ZOFRAN) 4 MG tablet   Oral   Take 1 tablet (4 mg total) by mouth every 6 (six) hours.   12 tablet   0    BP 172/96  Pulse 79  Temp(Src) 98.1 F (36.7 C) (Oral)  Resp 20  SpO2 100%  LMP 03/28/2013 Physical Exam  Vitals reviewed. Constitutional: She is oriented to person, place, and time. She appears well-developed and well-nourished. She appears distressed.  HENT:  Head: Normocephalic and atraumatic.  Mouth/Throat: Oropharynx is clear and moist.  Eyes: Conjunctivae and EOM are normal. Right eye exhibits no discharge. Left eye exhibits no discharge.  Neck: Normal range of motion. Neck supple. No JVD present.  Cardiovascular: Normal rate and regular rhythm.  Exam reveals no friction rub.   Murmur heard. Pulmonary/Chest: Effort normal and breath sounds normal. No stridor. No respiratory distress. She has no wheezes. She has no rales. She exhibits no tenderness.  Abdominal: Soft. Normal appearance and bowel sounds are normal. She exhibits no shifting dullness, no distension, no pulsatile liver, no fluid wave, no abdominal bruit and no ascites. There is no hepatosplenomegaly, splenomegaly or hepatomegaly. There is generalized tenderness. There is no rigidity, no rebound, no guarding, no CVA tenderness, no tenderness at McBurney's point and negative Murphy's sign.  Musculoskeletal: Normal range of motion. She exhibits no edema and no tenderness.  Neurological: She is alert and oriented to person, place, and time. She has normal reflexes.  Skin:  Skin is warm and dry.   Pt declines pelvic exam.  She also denies VB, VD, or pelvic pain.    ED Course  Procedures (including critical care time) Labs Review Imaging Review No results found.  EKG Interpretation    Date/Time:  Saturday April 10 2013 12:10:33 EST Ventricular Rate:  60 PR Interval:  167 QRS Duration: 73 QT Interval:  545 QTC Calculation: 545 R Axis:   64 Text Interpretation:  Sinus rhythm Consider left ventricular hypertrophy Prolonged QT interval Baseline wander in lead(s) V4 V5 V6 Confirmed by MASNERI  MD, DAVID ([id number]) on 04/10/2013 5:13:36 PM           Results for orders placed during the hospital encounter of 04/10/13  COMPREHENSIVE METABOLIC PANEL      Result Value Range   Sodium 137  137 - 147 mEq/L   Potassium 3.5 (*) 3.7 - 5.3 mEq/L  Chloride 96  96 - 112 mEq/L   CO2 22  19 - 32 mEq/L   Glucose, Bld 101 (*) 70 - 99 mg/dL   BUN 12  6 - 23 mg/dL   Creatinine, Ser 9.81  0.50 - 1.10 mg/dL   Calcium 8.9  8.4 - 19.1 mg/dL   Total Protein 8.3  6.0 - 8.3 g/dL   Albumin 3.7  3.5 - 5.2 g/dL   AST 19  0 - 37 U/L   ALT 12  0 - 35 U/L   Alkaline Phosphatase 62  39 - 117 U/L   Total Bilirubin 0.5  0.3 - 1.2 mg/dL   GFR calc non Af Amer 73 (*) >90 mL/min   GFR calc Af Amer 84 (*) >90 mL/min  CBC WITH DIFFERENTIAL      Result Value Range   WBC 11.2 (*) 4.0 - 10.5 K/uL   RBC 5.64 (*) 3.87 - 5.11 MIL/uL   Hemoglobin 13.7  12.0 - 15.0 g/dL   HCT 47.8  29.5 - 62.1 %   MCV 72.0 (*) 78.0 - 100.0 fL   MCH 24.3 (*) 26.0 - 34.0 pg   MCHC 33.7  30.0 - 36.0 g/dL   RDW 30.8  65.7 - 84.6 %   Platelets 281  150 - 400 K/uL   Neutrophils Relative % 78 (*) 43 - 77 %   Neutro Abs 8.6 (*) 1.7 - 7.7 K/uL   Lymphocytes Relative 16  12 - 46 %   Lymphs Abs 1.8  0.7 - 4.0 K/uL   Monocytes Relative 6  3 - 12 %   Monocytes Absolute 0.7  0.1 - 1.0 K/uL   Eosinophils Relative 0  0 - 5 %   Eosinophils Absolute 0.0  0.0 - 0.7 K/uL   Basophils Relative 0  0 - 1 %    Basophils Absolute 0.1  0.0 - 0.1 K/uL  LIPASE, BLOOD      Result Value Range   Lipase 14  11 - 59 U/L  URINALYSIS, ROUTINE W REFLEX MICROSCOPIC      Result Value Range   Color, Urine AMBER (*) YELLOW   APPearance CLOUDY (*) CLEAR   Specific Gravity, Urine 1.027  1.005 - 1.030   pH 5.5  5.0 - 8.0   Glucose, UA NEGATIVE  NEGATIVE mg/dL   Hgb urine dipstick TRACE (*) NEGATIVE   Bilirubin Urine SMALL (*) NEGATIVE   Ketones, ur >80 (*) NEGATIVE mg/dL   Protein, ur 962 (*) NEGATIVE mg/dL   Urobilinogen, UA 0.2  0.0 - 1.0 mg/dL   Nitrite NEGATIVE  NEGATIVE   Leukocytes, UA NEGATIVE  NEGATIVE  URINE MICROSCOPIC-ADD ON      Result Value Range   Squamous Epithelial / LPF MANY (*) RARE   WBC, UA 0-2  <3 WBC/hpf   RBC / HPF 0-2  <3 RBC/hpf   Bacteria, UA RARE  RARE   Casts GRANULAR CAST (*) NEGATIVE   Urine-Other AMORPHOUS URATES/PHOSPHATES    POCT I-STAT TROPONIN I      Result Value Range   Troponin i, poc 0.00  0.00 - 0.08 ng/mL   Comment 3           POCT PREGNANCY, URINE      Result Value Range   Preg Test, Ur NEGATIVE  NEGATIVE    MDM   1. Vomiting and diarrhea   2. Abdominal pain    48 year old African female presents emergency department with chief complaint of abdominal pain,  nausea vomiting and diarrhea. Her pain was diffuse and her physical exam was benign. She denied tenderness palpation McBurney's, no guarding rigidity or masses, and her symptoms completely resolved with pain medications and nausea medications. ER workup negative. Doubt serious bacterial illness, surgical abdomen, UTI.  Urine sample contaminated. Patient denies pelvic symptoms and declines pelvic examination.  5:15 PM The patient's symptom free in no acute distress. Requesting to go. Plan to discharge patient home with symptomatic medications of Zofran, Imodium, and Naprosyn. ER precautions were given and patient agrees with plan. If symptoms persist or worsen or patient develops fever she should return  emergency department at that time a CT of the abdomen and pelvis may be indicated. This point there is no evidence of need for CT or antibiotics.    Darlys Gales, MD 04/10/13 [phone]

## 2013-04-10 NOTE — ED Notes (Signed)
To room via EMS.  Onset 04-04-13 vomiting, diarrhea. Vomiting/diarrhea stopped Wednesday and returned this morning with abd pain, vomiting x 3-4, diarrhea x 3-4.  All across upper abd.  Decreased bowel sounds.  EMS BP: 142/58 HR 60.  Pain 10/10.  EMS was unable to obtain blood for CBG.

## 2013-08-26 ENCOUNTER — Encounter (HOSPITAL_COMMUNITY): Payer: Self-pay | Admitting: Emergency Medicine

## 2013-08-26 DIAGNOSIS — R109 Unspecified abdominal pain: Secondary | ICD-10-CM | POA: Insufficient documentation

## 2013-08-26 LAB — CBC
HEMATOCRIT: 36.5 % (ref 36.0–46.0)
HEMOGLOBIN: 11.6 g/dL — AB (ref 12.0–15.0)
MCH: 22.4 pg — ABNORMAL LOW (ref 26.0–34.0)
MCHC: 31.8 g/dL (ref 30.0–36.0)
MCV: 70.3 fL — ABNORMAL LOW (ref 78.0–100.0)
Platelets: 295 10*3/uL (ref 150–400)
RBC: 5.19 MIL/uL — ABNORMAL HIGH (ref 3.87–5.11)
RDW: 16.4 % — ABNORMAL HIGH (ref 11.5–15.5)
WBC: 10.1 10*3/uL (ref 4.0–10.5)

## 2013-08-26 LAB — COMPREHENSIVE METABOLIC PANEL
ALT: 8 U/L (ref 0–35)
AST: 16 U/L (ref 0–37)
Albumin: 3.3 g/dL — ABNORMAL LOW (ref 3.5–5.2)
Alkaline Phosphatase: 56 U/L (ref 39–117)
BUN: 18 mg/dL (ref 6–23)
CALCIUM: 9.2 mg/dL (ref 8.4–10.5)
CO2: 24 mEq/L (ref 19–32)
Chloride: 99 mEq/L (ref 96–112)
Creatinine, Ser: 1.08 mg/dL (ref 0.50–1.10)
GFR calc non Af Amer: 59 mL/min — ABNORMAL LOW (ref >90)
GFR, EST AFRICAN AMERICAN: 69 mL/min — AB (ref >90)
GLUCOSE: 106 mg/dL — AB (ref 70–99)
Potassium: 3.6 mEq/L — ABNORMAL LOW (ref 3.7–5.3)
SODIUM: 136 meq/L — AB (ref 137–147)
TOTAL PROTEIN: 7.8 g/dL (ref 6.0–8.3)
Total Bilirubin: 0.4 mg/dL (ref 0.3–1.2)

## 2013-08-26 MED ORDER — OXYCODONE-ACETAMINOPHEN 5-325 MG PO TABS
1.0000 | ORAL_TABLET | Freq: Once | ORAL | Status: AC
  Administered 2013-08-26: 1 via ORAL
  Filled 2013-08-26: qty 1

## 2013-08-26 NOTE — ED Notes (Addendum)
Belly and back pain starting yesterday; denies diarr and vomiting. States she feels like it did when she had a stomach bug. Pt states she saw it's busy so should have called ambulance. Requesting pain pill.

## 2013-08-27 ENCOUNTER — Emergency Department (HOSPITAL_COMMUNITY): Payer: Self-pay

## 2013-08-27 ENCOUNTER — Emergency Department (HOSPITAL_COMMUNITY)
Admission: EM | Admit: 2013-08-27 | Discharge: 2013-08-27 | Discharge status: Against Medical Advice | Payer: Self-pay | Attending: Emergency Medicine | Admitting: Emergency Medicine

## 2013-08-27 ENCOUNTER — Encounter (HOSPITAL_COMMUNITY): Payer: Self-pay | Admitting: Emergency Medicine

## 2013-08-27 ENCOUNTER — Emergency Department (HOSPITAL_COMMUNITY)
Admission: EM | Admit: 2013-08-27 | Discharge: 2013-08-27 | Disposition: A | Discharge status: Home / Self Care | Payer: Self-pay | Attending: Emergency Medicine | Admitting: Emergency Medicine

## 2013-08-27 DIAGNOSIS — F17200 Nicotine dependence, unspecified, uncomplicated: Secondary | ICD-10-CM | POA: Insufficient documentation

## 2013-08-27 DIAGNOSIS — Z8639 Personal history of other endocrine, nutritional and metabolic disease: Secondary | ICD-10-CM | POA: Insufficient documentation

## 2013-08-27 DIAGNOSIS — R109 Unspecified abdominal pain: Secondary | ICD-10-CM

## 2013-08-27 DIAGNOSIS — Z862 Personal history of diseases of the blood and blood-forming organs and certain disorders involving the immune mechanism: Secondary | ICD-10-CM | POA: Insufficient documentation

## 2013-08-27 DIAGNOSIS — R1084 Generalized abdominal pain: Secondary | ICD-10-CM | POA: Insufficient documentation

## 2013-08-27 DIAGNOSIS — I10 Essential (primary) hypertension: Secondary | ICD-10-CM | POA: Insufficient documentation

## 2013-08-27 DIAGNOSIS — Z880 Allergy status to penicillin: Secondary | ICD-10-CM | POA: Insufficient documentation

## 2013-08-27 DIAGNOSIS — Z3202 Encounter for pregnancy test, result negative: Secondary | ICD-10-CM | POA: Insufficient documentation

## 2013-08-27 DIAGNOSIS — Z79899 Other long term (current) drug therapy: Secondary | ICD-10-CM | POA: Insufficient documentation

## 2013-08-27 DIAGNOSIS — Z8619 Personal history of other infectious and parasitic diseases: Secondary | ICD-10-CM | POA: Insufficient documentation

## 2013-08-27 DIAGNOSIS — A5903 Trichomonal cystitis and urethritis: Secondary | ICD-10-CM

## 2013-08-27 DIAGNOSIS — R112 Nausea with vomiting, unspecified: Secondary | ICD-10-CM | POA: Insufficient documentation

## 2013-08-27 LAB — COMPREHENSIVE METABOLIC PANEL
ALT: 9 U/L (ref 0–35)
AST: 17 U/L (ref 0–37)
Albumin: 3.4 g/dL — ABNORMAL LOW (ref 3.5–5.2)
Alkaline Phosphatase: 59 U/L (ref 39–117)
BUN: 20 mg/dL (ref 6–23)
CO2: 25 mEq/L (ref 19–32)
Calcium: 9.3 mg/dL (ref 8.4–10.5)
Chloride: 99 mEq/L (ref 96–112)
Creatinine, Ser: 1.05 mg/dL (ref 0.50–1.10)
GFR calc non Af Amer: 61 mL/min — ABNORMAL LOW (ref >90)
GFR, EST AFRICAN AMERICAN: 71 mL/min — AB (ref >90)
GLUCOSE: 155 mg/dL — AB (ref 70–99)
Potassium: 4 mEq/L (ref 3.7–5.3)
Sodium: 137 mEq/L (ref 137–147)
TOTAL PROTEIN: 8.1 g/dL (ref 6.0–8.3)
Total Bilirubin: 0.6 mg/dL (ref 0.3–1.2)

## 2013-08-27 LAB — CBC WITH DIFFERENTIAL/PLATELET
Basophils Absolute: 0 10*3/uL (ref 0.0–0.1)
Basophils Relative: 0 % (ref 0–1)
EOS ABS: 0 10*3/uL (ref 0.0–0.7)
Eosinophils Relative: 0 % (ref 0–5)
HEMATOCRIT: 41.3 % (ref 36.0–46.0)
HEMOGLOBIN: 13.2 g/dL (ref 12.0–15.0)
Lymphocytes Relative: 8 % — ABNORMAL LOW (ref 12–46)
Lymphs Abs: 1.2 10*3/uL (ref 0.7–4.0)
MCH: 22.6 pg — AB (ref 26.0–34.0)
MCHC: 32 g/dL (ref 30.0–36.0)
MCV: 70.6 fL — ABNORMAL LOW (ref 78.0–100.0)
MONOS PCT: 4 % (ref 3–12)
Monocytes Absolute: 0.6 10*3/uL (ref 0.1–1.0)
Neutro Abs: 12.7 10*3/uL — ABNORMAL HIGH (ref 1.7–7.7)
Neutrophils Relative %: 88 % — ABNORMAL HIGH (ref 43–77)
Platelets: 327 10*3/uL (ref 150–400)
RBC: 5.85 MIL/uL — ABNORMAL HIGH (ref 3.87–5.11)
RDW: 16.3 % — ABNORMAL HIGH (ref 11.5–15.5)
WBC: 14.5 10*3/uL — ABNORMAL HIGH (ref 4.0–10.5)

## 2013-08-27 LAB — URINALYSIS, ROUTINE W REFLEX MICROSCOPIC
Glucose, UA: NEGATIVE mg/dL
Hgb urine dipstick: NEGATIVE
KETONES UR: NEGATIVE mg/dL
NITRITE: NEGATIVE
PROTEIN: 30 mg/dL — AB
Specific Gravity, Urine: 1.037 — ABNORMAL HIGH (ref 1.005–1.030)
UROBILINOGEN UA: 0.2 mg/dL (ref 0.0–1.0)
pH: 5.5 (ref 5.0–8.0)

## 2013-08-27 LAB — URINE MICROSCOPIC-ADD ON

## 2013-08-27 LAB — GC/CHLAMYDIA PROBE AMP
CT Probe RNA: NEGATIVE
GC Probe RNA: NEGATIVE

## 2013-08-27 LAB — WET PREP, GENITAL
WBC, Wet Prep HPF POC: NONE SEEN
YEAST WET PREP: NONE SEEN

## 2013-08-27 LAB — LIPASE, BLOOD: LIPASE: 16 U/L (ref 11–59)

## 2013-08-27 LAB — PREGNANCY, URINE: Preg Test, Ur: NEGATIVE

## 2013-08-27 MED ORDER — IOHEXOL 300 MG/ML  SOLN
100.0000 mL | Freq: Once | INTRAMUSCULAR | Status: AC | PRN
  Administered 2013-08-27: 100 mL via INTRAVENOUS

## 2013-08-27 MED ORDER — HYDROCODONE-ACETAMINOPHEN 5-325 MG PO TABS: 1.0000 | ORAL_TABLET | Freq: Four times a day (QID) | ORAL | Status: DC | PRN

## 2013-08-27 MED ORDER — IOHEXOL 300 MG/ML  SOLN
50.0000 mL | Freq: Once | INTRAMUSCULAR | Status: AC | PRN
  Administered 2013-08-27: 50 mL via ORAL

## 2013-08-27 MED ORDER — METRONIDAZOLE 500 MG PO TABS: 500.0000 mg | ORAL_TABLET | Freq: Three times a day (TID) | ORAL | Status: DC

## 2013-08-27 MED ORDER — MORPHINE SULFATE 4 MG/ML IJ SOLN
4.0000 mg | Freq: Once | INTRAMUSCULAR | Status: AC
  Administered 2013-08-27: 4 mg via INTRAVENOUS
  Filled 2013-08-27: qty 1

## 2013-08-27 MED ORDER — SODIUM CHLORIDE 0.9 % IV BOLUS (SEPSIS)
1000.0000 mL | Freq: Once | INTRAVENOUS | Status: AC
  Administered 2013-08-27: 1000 mL via INTRAVENOUS

## 2013-08-27 MED ORDER — ONDANSETRON HCL 4 MG PO TABS: 4.0000 mg | ORAL_TABLET | Freq: Four times a day (QID) | ORAL | Status: DC

## 2013-08-27 MED ORDER — CIPROFLOXACIN HCL 500 MG PO TABS: 500.0000 mg | ORAL_TABLET | Freq: Two times a day (BID) | ORAL | Status: DC

## 2013-08-27 MED ORDER — ONDANSETRON HCL 4 MG/2ML IJ SOLN
4.0000 mg | INTRAMUSCULAR | Status: AC
  Administered 2013-08-27: 4 mg via INTRAVENOUS
  Filled 2013-08-27: qty 2

## 2013-08-27 NOTE — ED Notes (Signed)
MD at bedside. 

## 2013-08-27 NOTE — ED Notes (Signed)
Patient transported to CT 

## 2013-08-27 NOTE — ED Provider Notes (Signed)
7:00 AM  Patient signed out to me today by Antony Madura, PA-C.  Patient awaiting results of her CT scan of her abdomen and pelvis.  Disposition pending results of the CT.  Plan is for patient to be discharged if no acute findings on CT.  7:40 AM Results of the CT scan area as follows: IMPRESSION:  1. Multiple distinct segments of marked mucosal edema and wall  thickening noted along the small bowel, involving the third and  fourth segments of the duodenum, the mid jejunum, the proximal  ileum, and a relatively long segment of mid to distal ileum within  the lower abdomen. The more distal small bowel is decompressed.  Associated mesenteric edema seen. Findings raise concern for an  acute infectious enteritis or inflammatory bowel disease. Lymphoma  is considered less likely, but cannot be entirely excluded.  2. Associated small amount of ascites noted within the abdomen and  pelvis, demonstrating mildly increased attenuation at the level of  the pelvis.  3. Fibroid uterus noted.  Discussed results of the CT with patient.  Patient informed that Lymphoma is considered less likely, but is not entirely excluded.  Patient given referral to GI and explained the importance of follow up.  Patient's pain is well controlled.  No vomiting.  Patient is stable for discharge.  Patient started on Cipro and Flagyl and also given prescription for Zofran and Norco.  Return precautions given.    Santiago Glad, PA-C 08/27/13 [phone]

## 2013-08-27 NOTE — ED Notes (Signed)
Pt brought in by [facility] with c/o abd pain that started yesterday  Pt is moaning and groaning in triage  Pt was at [facility] and left went home then called EMS to come here  Pt states the wait was too long over there  Pt states her pain is constant and feels like labor pain  Pt states she has had vomiting  Denies diarrhea

## 2013-08-27 NOTE — ED Provider Notes (Signed)
I was available for consult during the completion of this patient's course.  Gerhard Munch, MD 08/27/13 [phone]

## 2013-08-27 NOTE — Discharge Instructions (Signed)
As we discussed your CT scan showed inflammation of the small and large intestine.  There is a small chance that this could be Lymphoma.  It is important for you to follow up with a Gastroenterologist for further evaluation.  Take antibiotics as directed.  Take pain medication and Zofran as needed.  Do not drive or operate heavy machinery for four hours after taking pain medication.

## 2013-08-27 NOTE — ED Provider Notes (Signed)
CSN: [id number]     Arrival date & time 08/27/13  0037 History   First MD Initiated Contact with Patient 08/27/13 0345     Chief Complaint  Patient presents with  . Abdominal Pain    (Consider location/radiation/quality/duration/timing/severity/associated sxs/prior Treatment) HPI Comments: 48 year old female with a history of hypertension, cocaine abuse, hypothyroidism, and Graves' disease presents to the emergency department for abdominal pain. Patient states that pain began yesterday and waxes and wanes in severity. Patient describes the pain as "labor pains". She denies any radiation of the pain and states that it is present "all over" her abdomen. Symptoms associated with nausea as well as nonbloody emesis. She denies any modifying factors of her symptoms as well as associated fever, chest pain, shortness of breath, hematemesis, diarrhea, melena, hematochezia, urinary symptoms, vaginal complaints, numbness/tingling, weakness, and syncope. Patient denies a history of abdominal surgeries. Last bowel movement within the last 12 hours; normal in color and consistency.  Patient is a 48 y.o. female presenting with abdominal pain. The history is provided by the patient. No language interpreter was used.  Abdominal Pain Associated symptoms: nausea and vomiting   Associated symptoms: no chest pain, no diarrhea, no dysuria, no fever, no hematuria, no shortness of breath, no vaginal bleeding and no vaginal discharge     Past Medical History  Diagnosis Date  . Hypertension   . Tobacco abuse   . Cocaine abuse   . Scabies   . Hyperthyroidism   . Grave's disease     s/p radioiodine ablation 03/05/10, F/B Dr. Chestine Spore. On Methimazole  . Thyroid condition    Past Surgical History  Procedure Laterality Date  . Btl    . Mouth surgery     History reviewed. No pertinent family history. History  Substance Use Topics  . Smoking status: Current Every Day Smoker -- 0.10 packs/day    Types: Cigarettes  .  Smokeless tobacco: Not on file     Comment: 1 cig a day  . Alcohol Use: Yes   OB History   Grav Para Term Preterm Abortions TAB SAB Ect Mult Living                 Review of Systems  Constitutional: Negative for fever.  Respiratory: Negative for shortness of breath.   Cardiovascular: Negative for chest pain.  Gastrointestinal: Positive for nausea, vomiting and abdominal pain. Negative for diarrhea and blood in stool.  Genitourinary: Negative for dysuria, hematuria, vaginal bleeding and vaginal discharge.  All other systems reviewed and are negative.    Allergies  Penicillins  Home Medications   Prior to Admission medications   Medication Sig Start Date End Date Taking? Authorizing Provider  ibuprofen (ADVIL,MOTRIN) 200 MG tablet Take 400 mg by mouth every 6 (six) hours as needed for mild pain.   Yes Historical Provider, MD  naproxen (NAPROSYN) 375 MG tablet Take 1 tablet (375 mg total) by mouth 2 (two) times daily. 04/10/13  Yes Darlys Gales, MD   BP 134/80  Pulse 78  Resp 22  SpO2 97%  LMP 07/30/2013  Physical Exam  Nursing note and vitals reviewed. Constitutional: She is oriented to person, place, and time. She appears well-developed and well-nourished. No distress.  Nontoxic/nonseptic appearing  HENT:  Head: Normocephalic and atraumatic.  Mouth/Throat: Oropharynx is clear and moist. No oropharyngeal exudate.  Eyes: Conjunctivae and EOM are normal. No scleral icterus.  Neck: Normal range of motion.  Cardiovascular: Normal rate, regular rhythm and normal heart sounds.   Pulmonary/Chest:  Effort normal and breath sounds normal. No respiratory distress. She has no wheezes. She has no rales.  Chest expansion symmetric. No tachypnea or dyspnea.  Abdominal: Soft. She exhibits distension (mild). She exhibits no mass. There is tenderness. There is no rebound and no guarding.  Diffuse tenderness to palpation with tenderness appreciated to be worse in bilateral lower quadrants.  Negative Murphy sign. No peritoneal signs or involuntary guarding. No masses.  Genitourinary: Uterus normal. There is no rash, tenderness, lesion or injury on the right labia. There is no rash, tenderness, lesion or injury on the left labia. Cervix exhibits no motion tenderness, no discharge and no friability. Right adnexum displays no mass, no tenderness and no fullness. Left adnexum displays no mass, no tenderness and no fullness. No tenderness or bleeding around the vagina. No vaginal discharge found.  No cervical motion tenderness, adnexal tenderness, or uterine tenderness. No cervical friability or discharge.  Musculoskeletal: Normal range of motion.  Neurological: She is alert and oriented to person, place, and time.  Skin: Skin is warm and dry. No rash noted. She is not diaphoretic. No erythema. No pallor.  Psychiatric: She has a normal mood and affect. Her behavior is normal.    ED Course  Procedures (including critical care time) Labs Review Labs Reviewed  WET PREP, GENITAL - Abnormal; Notable for the following:    Trich, Wet Prep FEW (*)    Clue Cells Wet Prep HPF POC FEW (*)    All other components within normal limits  CBC WITH DIFFERENTIAL - Abnormal; Notable for the following:    WBC 14.5 (*)    RBC 5.85 (*)    MCV 70.6 (*)    MCH 22.6 (*)    RDW 16.3 (*)    Neutrophils Relative % 88 (*)    Neutro Abs 12.7 (*)    Lymphocytes Relative 8 (*)    All other components within normal limits  COMPREHENSIVE METABOLIC PANEL - Abnormal; Notable for the following:    Glucose, Bld 155 (*)    Albumin 3.4 (*)    GFR calc non Af Amer 61 (*)    GFR calc Af Amer 71 (*)    All other components within normal limits  URINALYSIS, ROUTINE W REFLEX MICROSCOPIC - Abnormal; Notable for the following:    Color, Urine AMBER (*)    APPearance TURBID (*)    Specific Gravity, Urine 1.037 (*)    Bilirubin Urine SMALL (*)    Protein, ur 30 (*)    Leukocytes, UA MODERATE (*)    All other  components within normal limits  URINE MICROSCOPIC-ADD ON - Abnormal; Notable for the following:    Squamous Epithelial / LPF MANY (*)    Bacteria, UA MANY (*)    Casts HYALINE CASTS (*)    All other components within normal limits  GC/CHLAMYDIA PROBE AMP  LIPASE, BLOOD  PREGNANCY, URINE    Imaging Review No results found.   EKG Interpretation None      MDM   Final diagnoses:  Trichomonal urethritis  Abdominal pain    48 year old female presents for abdominal pain with associated nausea and vomiting. Workup today significant for diffuse abdominal tenderness; however, tenderness appreciated to be worse in bilateral lower quadrants. Labs significant for leukocytosis of 14.5. Liver and kidney function preserved. Urinalysis does show a trichomonal urethritis. Urine pregnancy negative.  Pain and nausea well controlled with morphine and Zofran. IV fluids ordered. Will further evaluate abdominal pain with CT scan which is  currently pending. Patient signed out to Santiago Glad, PA-C at shift change. Plan includes disposition based on imaging. If imaging shows no acute findings, anticipate discharge with outpatient followup with course of Flagyl for trichomonal urethritis.   Filed Vitals:   08/27/13 0038 08/27/13 0040  BP:  134/80  Pulse:  78  TempSrc:  Other (Comment)  Resp:  22  SpO2: 97% 97%     Antony Madura, PA-C 08/27/13 [phone]

## 2013-08-27 NOTE — ED Notes (Signed)
Returned from CT.

## 2013-08-27 NOTE — ED Provider Notes (Signed)
Medical screening examination/treatment/procedure(s) were conducted as a shared visit with non-physician practitioner(s) and myself.  I personally evaluated the patient during the encounter.   EKG Interpretation None       Loren Racer, MD 08/27/13 [phone]

## 2013-08-27 NOTE — ED Notes (Signed)
Just notified pt is at [facility].

## 2013-11-03 NOTE — ED Provider Notes (Signed)
Medical screening examination/treatment/procedure(s) were conducted as a shared visit with non-physician practitioner(s) and myself.  I personally evaluated the patient during the encounter.   EKG Interpretation None     Patient presents with lower abdominal pain. No peritoneal signs. Elevated white blood cell count. Trichomonas in the urine. Signed out to oncoming emergency physician/ PA pending CT abdomen and disposition.  Loren Raceravid Elasia Furnish, MD 11/03/13 [phone]

## 2014-01-07 ENCOUNTER — Emergency Department (HOSPITAL_COMMUNITY)
Admission: EM | Admit: 2014-01-07 | Discharge: 2014-01-07 | Discharge status: Against Medical Advice | Payer: Self-pay | Attending: Emergency Medicine | Admitting: Emergency Medicine

## 2014-01-07 ENCOUNTER — Encounter (HOSPITAL_COMMUNITY): Payer: Self-pay | Admitting: Emergency Medicine

## 2014-01-07 DIAGNOSIS — M25552 Pain in left hip: Secondary | ICD-10-CM | POA: Insufficient documentation

## 2014-01-07 NOTE — ED Notes (Signed)
Pt up to nurse first and reported that she was going to leave- this RN encouraged pt to stay and told pt we could take her to an exam room right now, pt refused.

## 2014-01-07 NOTE — ED Notes (Signed)
Pt reports having left hip pain for extended amount of time, denies any injury. Pt ambulatory at triage.

## 2014-02-11 ENCOUNTER — Ambulatory Visit: Payer: Self-pay | Admitting: Family Medicine

## 2014-05-19 ENCOUNTER — Ambulatory Visit: Payer: Self-pay | Admitting: Internal Medicine

## 2014-07-26 IMAGING — CT CT ABD-PELV W/ CM
1 of 3 series · 13 of 32 positions shown, 18 images · IV contrast (100 ML OMNI 300)
Comparison: None.

CLINICAL DATA: Abdominal pain, nausea and vomiting. Leukocytosis.
White blood cells in the urine.

EXAM:
CT ABDOMEN AND PELVIS WITH CONTRAST
TECHNIQUE: Multidetector CT imaging of the abdomen and pelvis was performed
using the standard protocol following bolus administration of
intravenous contrast.
CONTRAST:  100mL OMNIPAQUE IOHEXOL 300 MG/ML  SOLN

[Series 2: abd/pel with · axial · 0.72mm/px · z∈[+1185,+1550]mm · 13 of 83 slices shown, 18 images]
[im 5/83  soft-tissue]
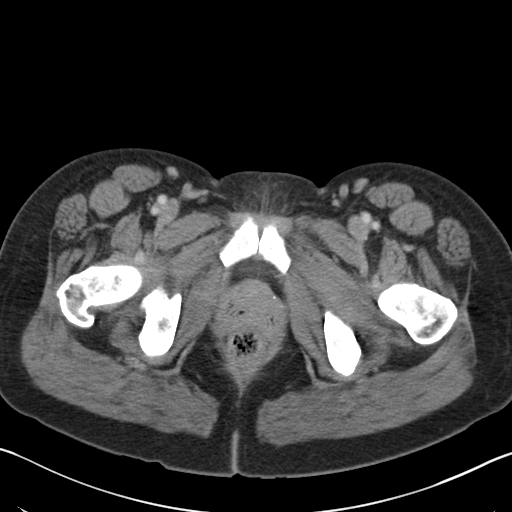
[im 5/83  bone]
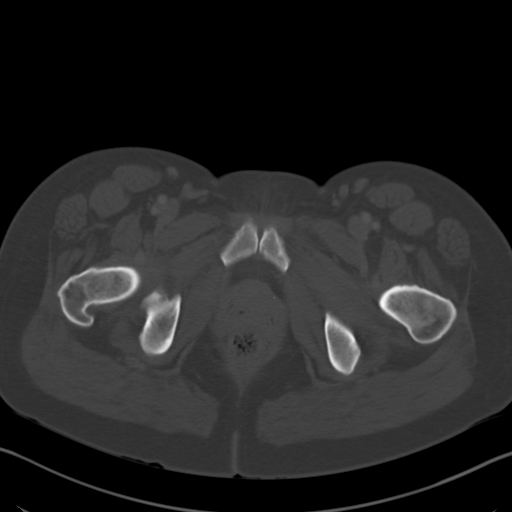
[im 13/83  soft-tissue]
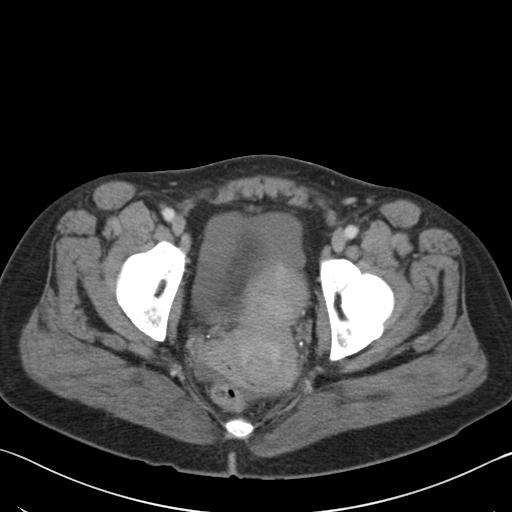
[im 17/83  soft-tissue]
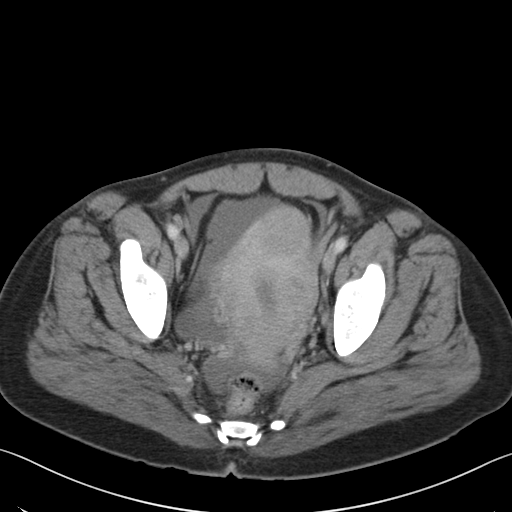
[im 25/83  soft-tissue]
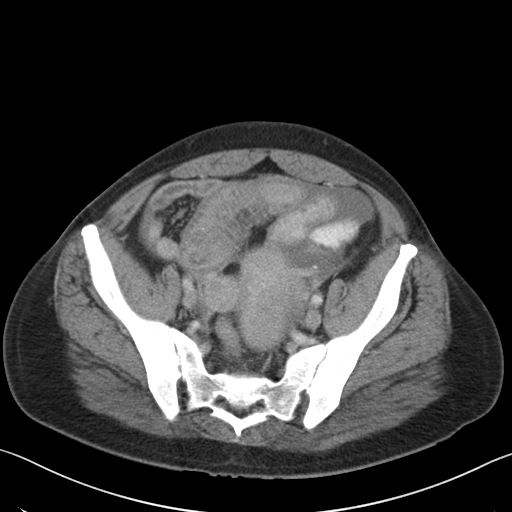
[im 33/83  soft-tissue]
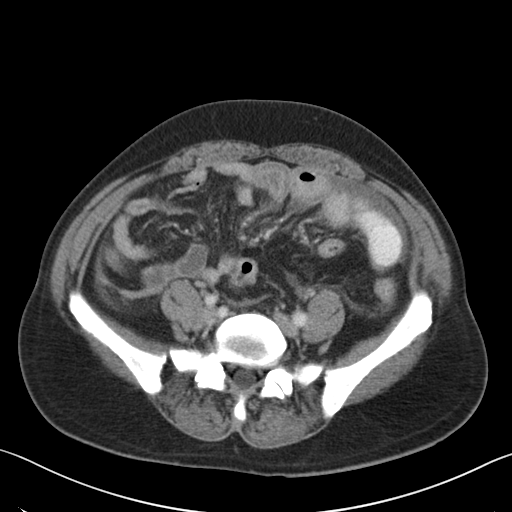
[im 37/83  soft-tissue]
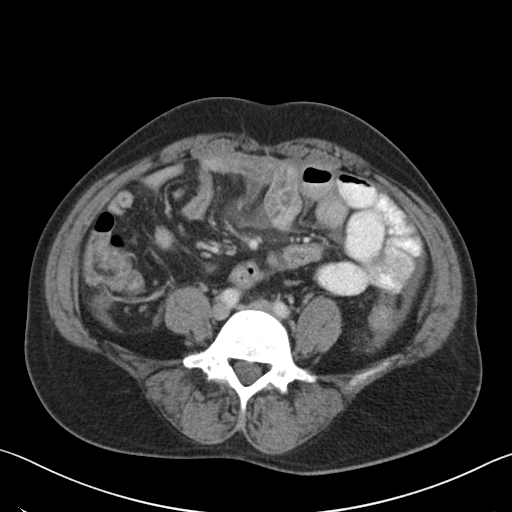
[im 46/83  soft-tissue]
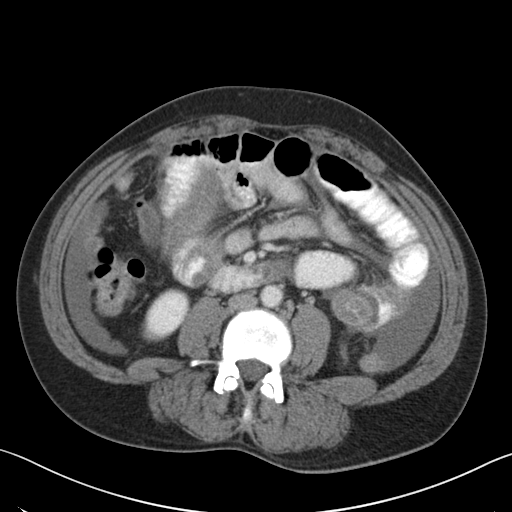
[im 50/83  soft-tissue]
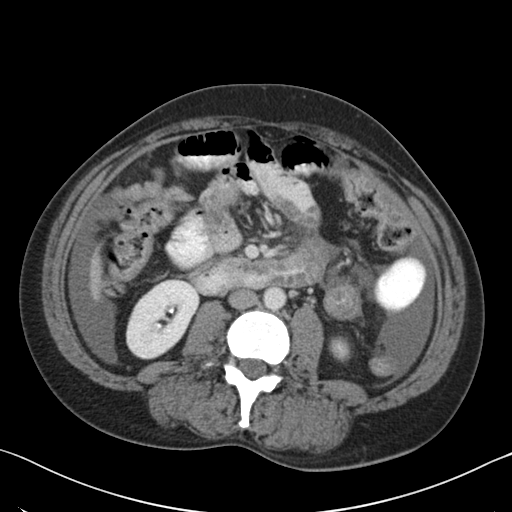
[im 58/83  soft-tissue]
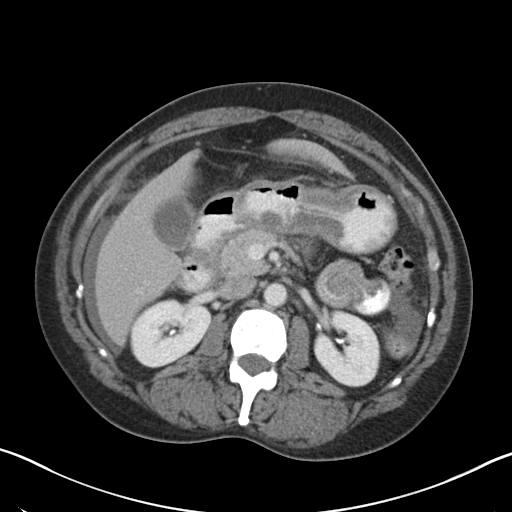
[im 58/83  bone]
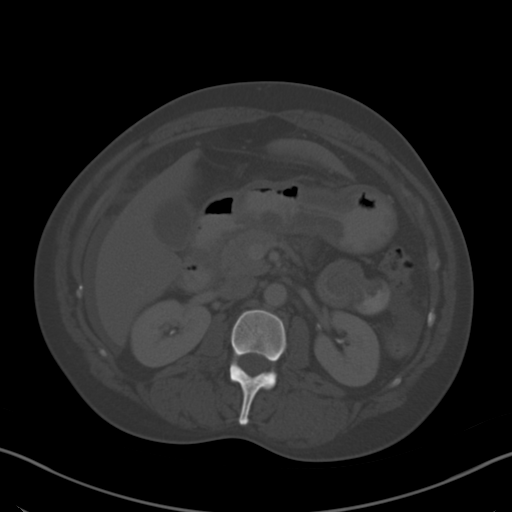
[im 66/83  soft-tissue]
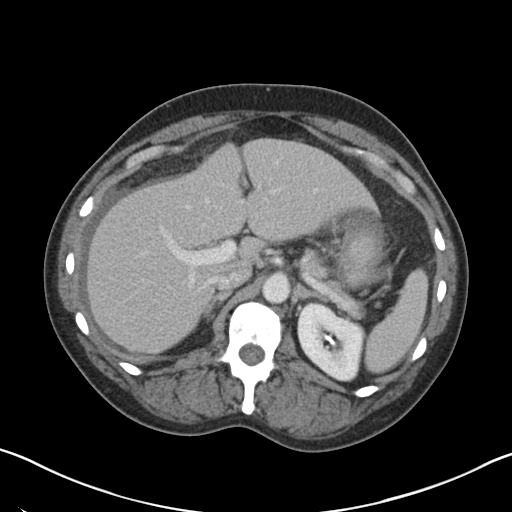
[im 66/83  lung]
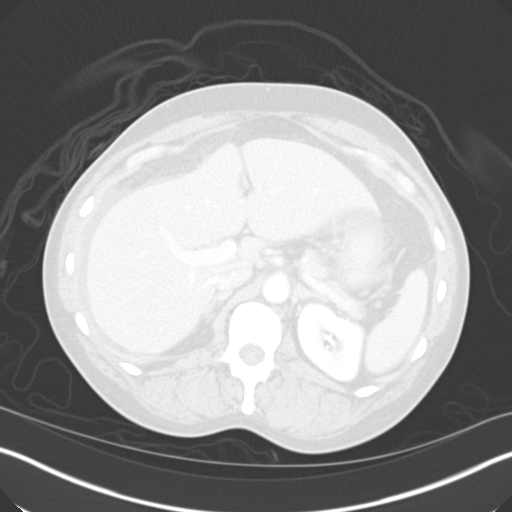
[im 70/83  soft-tissue]
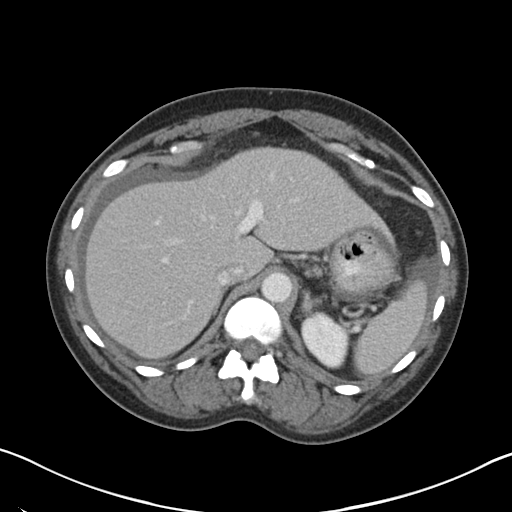
[im 70/83  lung]
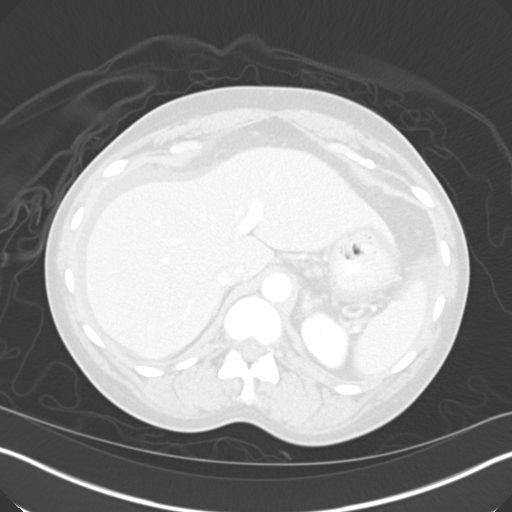
[im 74/83  lung]
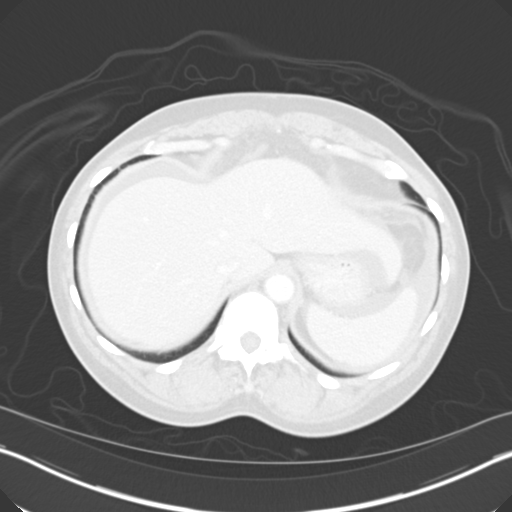
[im 78/83  soft-tissue]
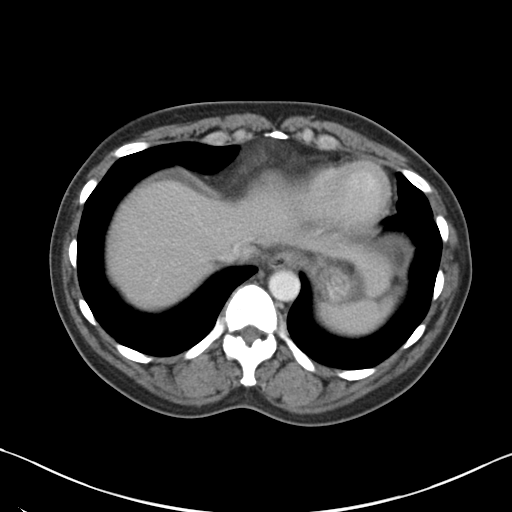
[im 78/83  lung]
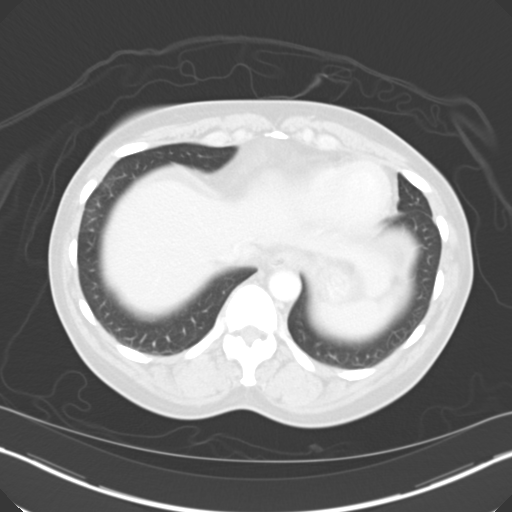

[13 of 32 positions shown; findings below may reference images not displayed]

FINDINGS: The visualized lung bases are clear.

Small volume ascites is noted within the abdomen and pelvis, of
slightly higher than simple fluid attenuation at the level of the
pelvis.

The liver and spleen are unremarkable in appearance. The gallbladder
is within normal limits. The pancreas and adrenal glands are
unremarkable.

The kidneys are unremarkable in appearance. Evaluation for renal
stones is limited given contrast within the renal calyces. There is
no evidence of hydronephrosis. No obstructing ureteral stones are
seen. No perinephric stranding is appreciated.

Multiple distinct segments of marked mucosal edema and wall
thickening are seen along the small bowel, involving the third and
fourth segments of the duodenum, the mid jejunum, the proximal
ileum, and a relatively long segment of mid to distal ileum within
the lower abdomen. More distal small bowel is decompressed. This
raises concern for an acute infectious or inflammatory process.
Scattered associated mesenteric edema is noted.

Apparent mucosal thickening at the body of the stomach is thought to
reflect relative decompression. The stomach is grossly unremarkable
in appearance. No acute vascular abnormalities are seen.

The appendix is normal in caliber, without evidence for
appendicitis. The colon is relatively decompressed and is
unremarkable in appearance.

The bladder is mildly distended and grossly unremarkable. Multiple
fibroids are seen within the uterus. The ovaries are relatively
symmetric; no suspicious adnexal masses are seen. No inguinal
lymphadenopathy is seen.

No acute osseous abnormalities are identified.
IMPRESSION: 1. Multiple distinct segments of marked mucosal edema and wall
thickening noted along the small bowel, involving the third and
fourth segments of the duodenum, the mid jejunum, the proximal
ileum, and a relatively long segment of mid to distal ileum within
the lower abdomen. The more distal small bowel is decompressed.
Associated mesenteric edema seen. Findings raise concern for an
acute infectious enteritis or inflammatory bowel disease. Lymphoma
is considered less likely, but cannot be entirely excluded.
2. Associated small amount of ascites noted within the abdomen and
pelvis, demonstrating mildly increased attenuation at the level of
the pelvis.
3. Fibroid uterus noted.

## 2014-11-09 ENCOUNTER — Ambulatory Visit: Payer: Self-pay | Attending: Internal Medicine | Admitting: Internal Medicine

## 2014-11-09 ENCOUNTER — Encounter: Payer: Self-pay | Admitting: Internal Medicine

## 2014-11-09 VITALS — BP 166/72 | HR 68 | Temp 98.3°F | Resp 16 | Ht 72.0 in | Wt 181.0 lb

## 2014-11-09 DIAGNOSIS — E079 Disorder of thyroid, unspecified: Secondary | ICD-10-CM | POA: Insufficient documentation

## 2014-11-09 DIAGNOSIS — R51 Headache: Secondary | ICD-10-CM | POA: Insufficient documentation

## 2014-11-09 DIAGNOSIS — Z79899 Other long term (current) drug therapy: Secondary | ICD-10-CM | POA: Insufficient documentation

## 2014-11-09 DIAGNOSIS — M5441 Lumbago with sciatica, right side: Secondary | ICD-10-CM | POA: Insufficient documentation

## 2014-11-09 DIAGNOSIS — I10 Essential (primary) hypertension: Secondary | ICD-10-CM | POA: Insufficient documentation

## 2014-11-09 DIAGNOSIS — E0500 Thyrotoxicosis with diffuse goiter without thyrotoxic crisis or storm: Secondary | ICD-10-CM | POA: Insufficient documentation

## 2014-11-09 DIAGNOSIS — F17210 Nicotine dependence, cigarettes, uncomplicated: Secondary | ICD-10-CM | POA: Insufficient documentation

## 2014-11-09 DIAGNOSIS — K5900 Constipation, unspecified: Secondary | ICD-10-CM | POA: Insufficient documentation

## 2014-11-09 MED ORDER — POLYETHYLENE GLYCOL 3350 17 GM/SCOOP PO POWD: 17.0000 g | Freq: Every day | ORAL | Status: DC

## 2014-11-09 MED ORDER — NAPROXEN 500 MG PO TABS: 500.0000 mg | ORAL_TABLET | Freq: Two times a day (BID) | ORAL | Status: DC

## 2014-11-09 MED ORDER — CYCLOBENZAPRINE HCL 10 MG PO TABS: 10.0000 mg | ORAL_TABLET | Freq: Three times a day (TID) | ORAL | Status: DC | PRN

## 2014-11-09 MED ORDER — AMLODIPINE BESYLATE 5 MG PO TABS: 5.0000 mg | ORAL_TABLET | Freq: Every day | ORAL | Status: DC

## 2014-11-09 NOTE — Progress Notes (Signed)
Pt's here to re-establish care and routine checkup. Pt c/o of hot flashes off and on for years and Pt states she hasn't taken BP meds x25mos. Never renewed [facility] card, therefore pt hasn't had meds refilled. Refill on vicodin, ibuprofen, naproxen.

## 2014-11-09 NOTE — Patient Instructions (Addendum)
Black Cohosh---NATURAL HERB FOR HOT FLASHES

## 2014-11-09 NOTE — Progress Notes (Signed)
Patient ID: Ann Jarvis, female   DOB: [birth year], 49 y.o.   MRN: [id number]  CC: HTN  HPI: Ann Jarvis is a 49 y.o. female here today for a follow up visit.  Patient has past medical history of HTN, tobacco use, substance abuse, and Grave's disease. Patient reports that she has been treated or hypertension in the past but does not remember which medication she has been on in the past. She reports having headaches so she knows that she needs to start taking better care of herself.  She reports occasional lower back pain that radiates to her right lower leg. It is a sharp shooting pain that radiates to her buttock and right thigh. She denies bowel or bladder dysfunction. She is up to date on her pap  She still has monthly menstrual cycles that are regular. She notes that she has hot flashes daily that sometimes make her feel dizzy and sick.   Allergies  Allergen Reactions  . Penicillins Swelling   Past Medical History  Diagnosis Date  . Hypertension   . Tobacco abuse   . Cocaine abuse   . Scabies   . Hyperthyroidism   . Grave's disease     s/p radioiodine ablation 03/05/10, F/B Dr. Chestine Spore. On Methimazole  . Thyroid condition    Current Outpatient Prescriptions on File Prior to Visit  Medication Sig Dispense Refill  . ibuprofen (ADVIL,MOTRIN) 200 MG tablet Take 400 mg by mouth every 6 (six) hours as needed for mild pain.    . naproxen (NAPROSYN) 375 MG tablet Take 1 tablet (375 mg total) by mouth 2 (two) times daily. 20 tablet 0   No current facility-administered medications on file prior to visit.   No family history on file. Social History   Social History  . Marital Status: Single    Spouse Name: N/A  . Number of Children: N/A  . Years of Education: N/A   Occupational History  . Not on file.   Social History Main Topics  . Smoking status: Current Every Day Smoker -- 0.10 packs/day    Types: Cigarettes  . Smokeless tobacco: Not on file     Comment: 1 cig a day   . Alcohol Use: Yes  . Drug Use: Yes    Special: Marijuana     Comment: marijuana  . Sexual Activity: Not on file   Other Topics Concern  . Not on file   Social History Narrative   Smokes 1 pack/week x 20 years, smokes marijuna, no cocaine in the last year.  Works for [employer].      Financial assistance approved for 100% discount at [facility] and has [facility] card Rudell Cobb Aug 01 2009 8.58am   Financial assistance approved for 100% discount at [facility] and has [facility] card Gavin Pound February 02, 2010 12.20pm    Review of Systems  Eyes: Negative for blurred vision.  Respiratory: Positive for shortness of breath (with walking and exercising). Negative for cough and wheezing.   Cardiovascular: Negative for chest pain, palpitations and leg swelling.  Gastrointestinal: Positive for constipation.  Musculoskeletal: Positive for back pain.  Neurological: Positive for dizziness. Negative for headaches.  Psychiatric/Behavioral: The patient is nervous/anxious (thinks it may be thyroid).   All other systems reviewed and are negative.   Objective:   Filed Vitals:   11/09/14 1553  BP: 166/72  Pulse: 68  Temp: 98.3 F (36.8 C)  Resp: 16    Physical Exam: Constitutional: Patient appears well-developed and well-nourished. No distress.  HENT: Normocephalic, atraumatic, External right and left ear normal. Oropharynx is clear and moist.  Eyes: Conjunctivae and EOM are normal. PERRLA, no scleral icterus. Neck: Normal ROM. Neck supple. No JVD. No tracheal deviation. No thyromegaly. CVS: RRR, S1/S2 +, no murmurs, no gallops, no carotid bruit.  Pulmonary: Effort and breath sounds normal, no stridor, rhonchi, wheezes, rales.  Abdominal: Soft. BS +,  no distension, tenderness, rebound or guarding.  Musculoskeletal: Normal range of motion. No edema and no tenderness.  Lymphadenopathy: No lymphadenopathy noted, cervical, inguinal or axillary Neuro: Alert. Normal reflexes, muscle tone coordination. No  cranial nerve deficit. Skin: Skin is warm and dry. No rash noted. Not diaphoretic. No erythema. No pallor. Psychiatric: Normal mood and affect. Behavior, judgment, thought content normal.  Lab Results  Component Value Date   WBC 14.5* 08/27/2013   HGB 13.2 08/27/2013   HCT 41.3 08/27/2013   MCV 70.6* 08/27/2013   PLT 327 08/27/2013   Lab Results  Component Value Date   CREATININE 1.05 08/27/2013   BUN 20 08/27/2013   NA 137 08/27/2013   K 4.0 08/27/2013   CL 99 08/27/2013   CO2 25 08/27/2013    No results found for: HGBA1C Lipid Panel     Component Value Date/Time   CHOL 150 10/10/2008 2127   TRIG 136 10/10/2008 2127   HDL 65 10/10/2008 2127   CHOLHDL 2.3 Ratio 10/10/2008 2127   VLDL 27 10/10/2008 2127   LDLCALC 58 10/10/2008 2127       Assessment and plan:   Ann Jarvis was seen today for annual exam.  Diagnoses and all orders for this visit:  Essential hypertension -     Lipid panel; Future -     COMPLETE METABOLIC PANEL WITH GFR; Future -     Begin amLODipine (NORVASC) 5 MG tablet; Take 1 tablet (5 mg total) by mouth daily. Will begin on low dose and recheck in 2 weeks. Will increase to 10 mg if her pressure remains >140/90  Thyroid disease -     TSH; Future -     T4, Free; Future She states that she has not been on any thyroid medication in at least 2 years.   Constipation, unspecified constipation type -     polyethylene glycol powder (GLYCOLAX/MIRALAX) powder; Take 17 g by mouth daily.  Explained that the daily recommended amount of fiber is 25 g, went over high fiber foods, encourage increased water intake, miralax use, and increased physical activity.  Patient given constipation handout.   Right-sided low back pain with right-sided sciatica -     naproxen (NAPROSYN) 500 MG tablet; Take 1 tablet (500 mg total) by mouth 2 (two) times daily with a meal. -     cyclobenzaprine (FLEXERIL) 10 MG tablet; Take 1 tablet (10 mg total) by mouth 3 (three) times  daily as needed for muscle spasms. Patient will try medication and back stretches for at least 6 weeks.    Return in about 2 days (around 11/11/2014) for Lab Visit and 2 weeks Rn visit-BP check and 3 mo PCP HTN.       Ambrose Finland, NP-C [facility] [phone] 11/09/2014, 4:12 PM

## 2014-11-11 ENCOUNTER — Ambulatory Visit: Payer: Self-pay | Attending: Internal Medicine

## 2014-11-11 DIAGNOSIS — E079 Disorder of thyroid, unspecified: Secondary | ICD-10-CM

## 2014-11-11 DIAGNOSIS — I10 Essential (primary) hypertension: Secondary | ICD-10-CM

## 2014-11-11 LAB — COMPLETE METABOLIC PANEL WITH GFR
ALT: 13 U/L (ref 6–29)
AST: 18 U/L (ref 10–35)
Albumin: 3.7 g/dL (ref 3.6–5.1)
Alkaline Phosphatase: 65 U/L (ref 33–130)
BILIRUBIN TOTAL: 0.3 mg/dL (ref 0.2–1.2)
BUN: 14 mg/dL (ref 7–25)
CO2: 27 mmol/L (ref 20–31)
CREATININE: 0.96 mg/dL (ref 0.50–1.05)
Calcium: 8.9 mg/dL (ref 8.6–10.4)
Chloride: 102 mmol/L (ref 98–110)
GFR, EST NON AFRICAN AMERICAN: 69 mL/min (ref >=60)
GFR, Est African American: 80 mL/min (ref >=60)
GLUCOSE: 97 mg/dL (ref 65–99)
Potassium: 3.7 mmol/L (ref 3.5–5.3)
Sodium: 142 mmol/L (ref 135–146)
Total Protein: 7.5 g/dL (ref 6.1–8.1)

## 2014-11-11 LAB — LIPID PANEL
CHOLESTEROL: 169 mg/dL (ref 125–200)
HDL: 75 mg/dL (ref >=46)
LDL Cholesterol: 75 mg/dL (ref <130)
TRIGLYCERIDES: 93 mg/dL (ref <150)
Total CHOL/HDL Ratio: 2.3 Ratio (ref <=5.0)
VLDL: 19 mg/dL (ref <30)

## 2014-11-11 LAB — TSH: TSH: 13.587 u[IU]/mL — AB (ref 0.350–4.500)

## 2014-11-11 LAB — T4, FREE: Free T4: 0.52 ng/dL — ABNORMAL LOW (ref 0.80–1.80)

## 2014-11-20 ENCOUNTER — Other Ambulatory Visit: Payer: Self-pay | Admitting: Internal Medicine

## 2014-11-20 DIAGNOSIS — E079 Disorder of thyroid, unspecified: Secondary | ICD-10-CM

## 2014-11-20 MED ORDER — LEVOTHYROXINE SODIUM 75 MCG PO TABS: 75.0000 ug | ORAL_TABLET | Freq: Every day | ORAL | Status: DC

## 2014-11-28 ENCOUNTER — Ambulatory Visit: Payer: Self-pay | Attending: Internal Medicine | Admitting: *Deleted

## 2014-11-28 VITALS — BP 170/84 | HR 66 | Temp 97.6°F | Resp 20 | Ht 71.5 in | Wt 181.6 lb

## 2014-11-28 DIAGNOSIS — I10 Essential (primary) hypertension: Secondary | ICD-10-CM

## 2014-11-28 DIAGNOSIS — Z720 Tobacco use: Secondary | ICD-10-CM | POA: Insufficient documentation

## 2014-11-28 MED ORDER — AMLODIPINE BESYLATE 10 MG PO TABS: 10.0000 mg | ORAL_TABLET | Freq: Every day | ORAL | Status: DC

## 2014-11-28 NOTE — Progress Notes (Signed)
Patient presents for BP check after starting amlodipine 5 mg daily Med list reviewed; states she wasn't aware of levothyroxine Labs from last OV reviewed with patient at today's nurse visit Patient willl pick up levothyroxine and start today Discussed need for low sodium diet and using Mrs. Dash as alternative to salt Walking 2 hours per day for exercise Patient denies headaches, blurred vision, SHOB, chest pain  C/o "stomach pain and gas"; rates 4/10 at present. States some relief with Gas-X. Pain increases after eating  C/o left hip pain X 2-3 months; rates 9/10 at present; requesting X-rays. Played basketball and ran track in school  Filed Vitals:   11/28/14 1137  BP: 170/84  Pulse: 66  Temp: 97.6 F (36.4 C)  Resp: 20    Per PCP's last note: Increase amlodipine to 10 mg daily  PCP made aware.  Patient to return in 2 weeks for nurse visit for BP check unless able to see PCP for stomach and hip pain sooner Patient aware to make 8 week lab appt for repeat TSH  Patient advised to call for med refills at least 7 days before running out so as not to go without.  Patient given literature on DASH Eating Plan and Hypothyroidism

## 2014-11-28 NOTE — Patient Instructions (Signed)
Hypothyroidism The thyroid is a large gland located in the lower front of your neck. The thyroid gland helps control metabolism. Metabolism is how your body handles food. It controls metabolism with the hormone thyroxine. When this gland is underactive (hypothyroid), it produces too little hormone.  CAUSES These include:   Absence or destruction of thyroid tissue.  Goiter due to iodine deficiency.  Goiter due to medications.  Congenital defects (since birth).  Problems with the pituitary. This causes a lack of TSH (thyroid stimulating hormone). This hormone tells the thyroid to turn out more hormone. SYMPTOMS  Lethargy (feeling as though you have no energy)  Cold intolerance  Weight gain (in spite of normal food intake)  Dry skin  Coarse hair  Menstrual irregularity (if severe, may lead to infertility)  Slowing of thought processes Cardiac problems are also caused by insufficient amounts of thyroid hormone. Hypothyroidism in the newborn is cretinism, and is an extreme form. It is important that this form be treated adequately and immediately or it will lead rapidly to retarded physical and mental development. DIAGNOSIS  To prove hypothyroidism, your caregiver may do blood tests and ultrasound tests. Sometimes the signs are hidden. It may be necessary for your caregiver to watch this illness with blood tests either before or after diagnosis and treatment. TREATMENT  Low levels of thyroid hormone are increased by using synthetic thyroid hormone. This is a safe, effective treatment. It usually takes about four weeks to gain the full effects of the medication. After you have the full effect of the medication, it will generally take another four weeks for problems to leave. Your caregiver may start you on low doses. If you have had heart problems the dose may be gradually increased. It is generally not an emergency to get rapidly to normal. HOME CARE INSTRUCTIONS   Take your  medications as your caregiver suggests. Let your caregiver know of any medications you are taking or start taking. Your caregiver will help you with dosage schedules.  As your condition improves, your dosage needs may increase. It will be necessary to have continuing blood tests as suggested by your caregiver.  Report all suspected medication side effects to your caregiver. SEEK MEDICAL CARE IF: Seek medical care if you develop:  Sweating.  Tremulousness (tremors).  Anxiety.  Rapid weight loss.  Heat intolerance.  Emotional swings.  Diarrhea.  Weakness. SEEK IMMEDIATE MEDICAL CARE IF:  You develop chest pain, an irregular heart beat (palpitations), or a rapid heart beat. MAKE SURE YOU:   Understand these instructions.  Will watch your condition.  Will get help right away if you are not doing well or get worse. Document Released: 03/18/2005 Document Revised: 06/10/2011 Document Reviewed: 11/06/2007 ExitCare Patient Information 2015 [employer]. This information is not intended to replace advice given to you by your health care provider. Make sure you discuss any questions you have with your health care provider. DASH Eating Plan DASH stands for "Dietary Approaches to Stop Hypertension." The DASH eating plan is a healthy eating plan that has been shown to reduce high blood pressure (hypertension). Additional health benefits may include reducing the risk of type 2 diabetes mellitus, heart disease, and stroke. The DASH eating plan may also help with weight loss. WHAT DO I NEED TO KNOW ABOUT THE DASH EATING PLAN? For the DASH eating plan, you will follow these general guidelines:  Choose foods with a percent daily value for sodium of less than 5% (as listed on the food label).    Use salt-free seasonings or herbs instead of table salt or sea salt.  Check with your health care provider or pharmacist before using salt substitutes.  Eat lower-sodium products, often labeled as  "lower sodium" or "no salt added."  Eat fresh foods.  Eat more vegetables, fruits, and low-fat dairy products.  Choose whole grains. Look for the word "whole" as the first word in the ingredient list.  Choose fish and skinless chicken or turkey more often than red meat. Limit fish, poultry, and meat to 6 oz (170 g) each day.  Limit sweets, desserts, sugars, and sugary drinks.  Choose heart-healthy fats.  Limit cheese to 1 oz (28 g) per day.  Eat more home-cooked food and less restaurant, buffet, and fast food.  Limit fried foods.  Cook foods using methods other than frying.  Limit canned vegetables. If you do use them, rinse them well to decrease the sodium.  When eating at a restaurant, ask that your food be prepared with less salt, or no salt if possible. WHAT FOODS CAN I EAT? Seek help from a dietitian for individual calorie needs. Grains Whole grain or whole wheat bread. Brown rice. Whole grain or whole wheat pasta. Quinoa, bulgur, and whole grain cereals. Low-sodium cereals. Corn or whole wheat flour tortillas. Whole grain cornbread. Whole grain crackers. Low-sodium crackers. Vegetables Fresh or frozen vegetables (raw, steamed, roasted, or grilled). Low-sodium or reduced-sodium tomato and vegetable juices. Low-sodium or reduced-sodium tomato sauce and paste. Low-sodium or reduced-sodium canned vegetables.  Fruits All fresh, canned (in natural juice), or frozen fruits. Meat and Other Protein Products Ground beef (85% or leaner), grass-fed beef, or beef trimmed of fat. Skinless chicken or turkey. Ground chicken or turkey. Pork trimmed of fat. All fish and seafood. Eggs. Dried beans, peas, or lentils. Unsalted nuts and seeds. Unsalted canned beans. Dairy Low-fat dairy products, such as skim or 1% milk, 2% or reduced-fat cheeses, low-fat ricotta or cottage cheese, or plain low-fat yogurt. Low-sodium or reduced-sodium cheeses. Fats and Oils Tub margarines without trans fats.  Light or reduced-fat mayonnaise and salad dressings (reduced sodium). Avocado. Safflower, olive, or canola oils. Natural peanut or almond butter. Other Unsalted popcorn and pretzels. The items listed above may not be a complete list of recommended foods or beverages. Contact your dietitian for more options. WHAT FOODS ARE NOT RECOMMENDED? Grains White bread. White pasta. White rice. Refined cornbread. Bagels and croissants. Crackers that contain trans fat. Vegetables Creamed or fried vegetables. Vegetables in a cheese sauce. Regular canned vegetables. Regular canned tomato sauce and paste. Regular tomato and vegetable juices. Fruits Dried fruits. Canned fruit in light or heavy syrup. Fruit juice. Meat and Other Protein Products Fatty cuts of meat. Ribs, chicken wings, bacon, sausage, bologna, salami, chitterlings, fatback, hot dogs, bratwurst, and packaged luncheon meats. Salted nuts and seeds. Canned beans with salt. Dairy Whole or 2% milk, cream, half-and-half, and cream cheese. Whole-fat or sweetened yogurt. Full-fat cheeses or blue cheese. Nondairy creamers and whipped toppings. Processed cheese, cheese spreads, or cheese curds. Condiments Onion and garlic salt, seasoned salt, table salt, and sea salt. Canned and packaged gravies. Worcestershire sauce. Tartar sauce. Barbecue sauce. Teriyaki sauce. Soy sauce, including reduced sodium. Steak sauce. Fish sauce. Oyster sauce. Cocktail sauce. Horseradish. Ketchup and mustard. Meat flavorings and tenderizers. Bouillon cubes. Hot sauce. Tabasco sauce. Marinades. Taco seasonings. Relishes. Fats and Oils Butter, stick margarine, lard, shortening, ghee, and bacon fat. Coconut, palm kernel, or palm oils. Regular salad dressings. Other Pickles and olives. Salted popcorn and   pretzels. The items listed above may not be a complete list of foods and beverages to avoid. Contact your dietitian for more information. WHERE CAN I FIND MORE  INFORMATION? National Heart, Lung, and Blood Institute: [employer] Document Released: 03/07/2011 Document Revised: 08/02/2013 Document Reviewed: 01/20/2013 [facility] Patient Information 2015 [place], [place]. This information is not intended to replace advice given to you by your health care provider. Make sure you discuss any questions you have with your health care provider.

## 2014-11-30 ENCOUNTER — Encounter: Payer: Self-pay | Admitting: Internal Medicine

## 2014-11-30 ENCOUNTER — Ambulatory Visit: Payer: Self-pay | Attending: Internal Medicine | Admitting: Internal Medicine

## 2014-11-30 VITALS — BP 156/96 | HR 71 | Temp 98.0°F | Resp 16 | Ht 72.0 in | Wt 182.0 lb

## 2014-11-30 DIAGNOSIS — E0500 Thyrotoxicosis with diffuse goiter without thyrotoxic crisis or storm: Secondary | ICD-10-CM | POA: Insufficient documentation

## 2014-11-30 DIAGNOSIS — K30 Functional dyspepsia: Secondary | ICD-10-CM | POA: Insufficient documentation

## 2014-11-30 DIAGNOSIS — R1013 Epigastric pain: Secondary | ICD-10-CM

## 2014-11-30 DIAGNOSIS — Z79899 Other long term (current) drug therapy: Secondary | ICD-10-CM | POA: Insufficient documentation

## 2014-11-30 DIAGNOSIS — E0590 Thyrotoxicosis, unspecified without thyrotoxic crisis or storm: Secondary | ICD-10-CM | POA: Insufficient documentation

## 2014-11-30 DIAGNOSIS — F17210 Nicotine dependence, cigarettes, uncomplicated: Secondary | ICD-10-CM | POA: Insufficient documentation

## 2014-11-30 DIAGNOSIS — I10 Essential (primary) hypertension: Secondary | ICD-10-CM | POA: Insufficient documentation

## 2014-11-30 DIAGNOSIS — E039 Hypothyroidism, unspecified: Secondary | ICD-10-CM | POA: Insufficient documentation

## 2014-11-30 DIAGNOSIS — M25552 Pain in left hip: Secondary | ICD-10-CM | POA: Insufficient documentation

## 2014-11-30 MED ORDER — RANITIDINE HCL 150 MG PO TABS: 150.0000 mg | ORAL_TABLET | Freq: Two times a day (BID) | ORAL | Status: DC

## 2014-11-30 MED ORDER — LOSARTAN POTASSIUM 50 MG PO TABS: 50.0000 mg | ORAL_TABLET | Freq: Every day | ORAL | Status: DC

## 2014-11-30 NOTE — Patient Instructions (Addendum)
Black Cohosh for hot flashes   Call me when you get the hospital discount so that I can place order for left hip xray

## 2014-11-30 NOTE — Addendum Note (Signed)
Addended by: Holland Commons A on: 11/30/2014 02:40 PM   Modules accepted: Orders

## 2014-11-30 NOTE — Progress Notes (Addendum)
Patient ID: Ann Jarvis, female   DOB: 06/06/1965, 49 y.o.   MRN: [id number]  CC:   HPI: Ann Jarvis is a 49 y.o. female here today for a follow up visit.  Patient has past medical history of hypothyroidism, HTN, and past drug use. Patient reports that since she seen my 2 weeks ago she noticed that her gas pain and constipation have improved but she notices abdominal bloating after she eats. She reports that she often feels like she has indigestion in her abdomen and chest.  She c/o of having left hip pain for the past couple of years. She states that she used to run track as a kid and thinks something may be wrong with her hip. It is a deep pain and feels unstable. She is requesting a x-ray.   Patient has No headache, No chest pain, No Nausea, No new weakness tingling or numbness, No Cough - SOB.  Allergies  Allergen Reactions  . Penicillins Swelling   Past Medical History  Diagnosis Date  . Hypertension   . Tobacco abuse   . Cocaine abuse   . Scabies   . Hyperthyroidism   . Grave's disease     s/p radioiodine ablation 03/05/10, F/B Dr. Chestine Spore. On Methimazole  . Thyroid condition    Current Outpatient Prescriptions on File Prior to Visit  Medication Sig Dispense Refill  . amLODipine (NORVASC) 10 MG tablet Take 1 tablet (10 mg total) by mouth daily. 30 tablet 2  . cyclobenzaprine (FLEXERIL) 10 MG tablet Take 1 tablet (10 mg total) by mouth 3 (three) times daily as needed for muscle spasms. 60 tablet 1  . levothyroxine (SYNTHROID, LEVOTHROID) 75 MCG tablet Take 1 tablet (75 mcg total) by mouth daily. (Patient not taking: Reported on 11/28/2014) 30 tablet 2  . naproxen (NAPROSYN) 500 MG tablet Take 1 tablet (500 mg total) by mouth 2 (two) times daily with a meal. 60 tablet 1  . polyethylene glycol powder (GLYCOLAX/MIRALAX) powder Take 17 g by mouth daily. 850 g 1   No current facility-administered medications on file prior to visit.   History reviewed. No pertinent family  history. Social History   Social History  . Marital Status: Single    Spouse Name: N/A  . Number of Children: N/A  . Years of Education: N/A   Occupational History  . Not on file.   Social History Main Topics  . Smoking status: Current Every Day Smoker -- 0.25 packs/day    Types: Cigarettes  . Smokeless tobacco: Not on file     Comment: 1 cig a day  . Alcohol Use: 0.6 - 1.2 oz/week    1-2 Cans of beer per week     Comment: occ  . Drug Use: Yes    Special: Marijuana     Comment: marijuana  . Sexual Activity: Not on file   Other Topics Concern  . Not on file   Social History Narrative   Smokes 1 pack/week x 20 years, smokes marijuna, no cocaine in the last year.  Works for [employer].      Financial assistance approved for 100% discount at [facility] and has [facility] card Rudell Cobb Aug 01 2009 8.58am   Financial assistance approved for 100% discount at [facility] and has [facility] card Gavin Pound February 02, 2010 12.20pm    Review of Systems: Other than what is stated in HPI, all other systems are negative.   Objective:   Filed Vitals:   11/30/14 1403  BP: 156/96  Pulse: 71  Temp: 98 F (36.7 C)  Resp: 16    Physical Exam  Constitutional: She is oriented to person, place, and time.  Cardiovascular: Normal rate, regular rhythm and normal heart sounds.   Pulmonary/Chest: Effort normal and breath sounds normal.  Abdominal: Soft. Bowel sounds are normal. She exhibits distension. There is no tenderness.  Musculoskeletal:  Pain with passive ROM of Left hip. Unable to full external rotate   Neurological: She is alert and oriented to person, place, and time.     Lab Results  Component Value Date   WBC 14.5* 08/27/2013   HGB 13.2 08/27/2013   HCT 41.3 08/27/2013   MCV 70.6* 08/27/2013   PLT 327 08/27/2013   Lab Results  Component Value Date   CREATININE 0.96 11/11/2014   BUN 14 11/11/2014   NA 142 11/11/2014   K 3.7 11/11/2014   CL 102 11/11/2014   CO2 27 11/11/2014     No results found for: HGBA1C Lipid Panel     Component Value Date/Time   CHOL 169 11/11/2014 1004   TRIG 93 11/11/2014 1004   HDL 75 11/11/2014 1004   CHOLHDL 2.3 11/11/2014 1004   VLDL 19 11/11/2014 1004   LDLCALC 75 11/11/2014 1004       Assessment and plan:   Keena was seen today for hip pain.  Diagnoses and all orders for this visit:  Acid indigestion -     ranitidine (ZANTAC) 150 MG tablet; Take 1 tablet (150 mg total) by mouth 2 (two) times daily. For acid reflux/bloating Discussed diet and weight with patient relating to acid reflux.  Went over things that may exacerbate acid reflux such as tomatoes, spicy foods, coffee, carbonated beverages, chocolates, etc.  Advised patient to avoid laying down at least two hours after meals and sleep with HOB elevated.   Left hip pain Patient will need to apply for hospital discount first and then a xray may be completed.  Essential hypertension -     losartan (COZAAR) 50 MG tablet; Take 1 tablet (50 mg total) by mouth daily. For blood pressure Patient reports that she took her amlodipine this morning. She is still elevated so I will place her on losartan 50 mg in addition to amlodipine. She will need to f/u in 2 weeks with Nurse visit to recheck   Return in about 3 months (around 03/01/2015) for Hypertension.       Ambrose Finland, NP-C [facility] [phone] 11/30/2014, 2:15 PM'

## 2014-11-30 NOTE — Progress Notes (Signed)
Patient here for follow up Patient requesting an x ray of her left hip because she has Been having some pian Patient also complains of having a lot of gas in her stomach

## 2014-12-02 ENCOUNTER — Telehealth: Payer: Self-pay

## 2014-12-02 NOTE — Telephone Encounter (Signed)
Nurse called patient, patient verified date of birth. Patient has picked up synthroid at pharmacy and is aware to take it early in the morning and wait 1 hour before eating or taking any other medications.  Patient questions if provider sent something for acid to pharmacy. Nurse researched chart, patient aware of losartan and zantac at pharmacy. Patient voices understanding and has no further questions at this time.

## 2014-12-02 NOTE — Telephone Encounter (Signed)
-----   Message from Ambrose Finland, NP sent at 11/20/2014  7:20 PM EDT ----- I have sent a prescription for Synthroid 75 mcg for patient to take daily. Please advise her to take early in the morning and wait 1 hour before eating or taking other medications for best absorption.

## 2014-12-02 NOTE — Telephone Encounter (Signed)
Nurse called patient, patient verified date of birth. Patient aware of need for TSH in 8 weeks. Patient transferred to front office staff to schedule lab visit in 8 weeks.  Patient voices understanding and has no further questions at this time.

## 2014-12-02 NOTE — Telephone Encounter (Signed)
-----   Message from Ambrose Finland, NP sent at 11/20/2014  7:23 PM EDT ----- Please see previous message. I have also placed a future order for her to come back for a TSH level in 8 weeks. Have her to schedule visit

## 2015-01-06 ENCOUNTER — Other Ambulatory Visit: Payer: Self-pay

## 2015-02-01 ENCOUNTER — Ambulatory Visit: Payer: Self-pay | Admitting: Internal Medicine

## 2015-04-11 ENCOUNTER — Telehealth: Payer: Self-pay | Admitting: Internal Medicine

## 2015-04-11 DIAGNOSIS — M5441 Lumbago with sciatica, right side: Secondary | ICD-10-CM

## 2015-04-11 MED ORDER — NAPROXEN 500 MG PO TABS: 500.0000 mg | ORAL_TABLET | Freq: Two times a day (BID) | ORAL | Status: DC

## 2015-04-11 MED FILL — NAPROXEN 500 MG TABLET: 500 | 30 days supply | Qty: 60 | Fill #0

## 2015-04-11 NOTE — Telephone Encounter (Signed)
Patient would like a refill on her pain medication for her hip.........she believes it was tramadol....please follow up

## 2015-04-11 NOTE — Telephone Encounter (Signed)
Returned phone call to patient  Patient requesting a refill on [facility]

## 2015-04-18 MED FILL — LEVOTHYROXINE 75 MCG TABLET: 75 | 30 days supply | Qty: 30 | Fill #1

## 2015-04-18 MED FILL — AMLODIPINE BESYLATE 10 MG T: 10 | 30 days supply | Qty: 30 | Fill #1

## 2015-06-02 MED FILL — NAPROXEN 500 MG TABLET: 500 | 30 days supply | Qty: 60 | Fill #1

## 2015-06-23 MED FILL — AMLODIPINE BESYLATE 10 MG T: 10 | 30 days supply | Qty: 30 | Fill #2

## 2015-06-23 MED FILL — LEVOTHYROXINE 75 MCG TABLET: 75 | 30 days supply | Qty: 30 | Fill #2

## 2015-06-26 MED FILL — CYCLOBENZAPRINE 10 MG TAB: 10 | 30 days supply | Qty: 60 | Fill #1

## 2015-07-10 ENCOUNTER — Emergency Department (HOSPITAL_COMMUNITY)
Admission: EM | Admit: 2015-07-10 | Discharge: 2015-07-10 | Disposition: A | Discharge status: Home / Self Care | Payer: Self-pay | Attending: Emergency Medicine | Admitting: Emergency Medicine

## 2015-07-10 ENCOUNTER — Encounter (HOSPITAL_COMMUNITY): Payer: Self-pay | Admitting: Emergency Medicine

## 2015-07-10 DIAGNOSIS — I10 Essential (primary) hypertension: Secondary | ICD-10-CM | POA: Insufficient documentation

## 2015-07-10 DIAGNOSIS — M25552 Pain in left hip: Secondary | ICD-10-CM | POA: Insufficient documentation

## 2015-07-10 DIAGNOSIS — M25551 Pain in right hip: Secondary | ICD-10-CM | POA: Insufficient documentation

## 2015-07-10 DIAGNOSIS — F17210 Nicotine dependence, cigarettes, uncomplicated: Secondary | ICD-10-CM | POA: Insufficient documentation

## 2015-07-10 NOTE — ED Notes (Signed)
Pt sts she has to leave

## 2015-07-10 NOTE — ED Notes (Signed)
Pt c/o bilateral hip pain x 2 months. Pt denies any injury

## 2015-07-11 ENCOUNTER — Other Ambulatory Visit: Payer: Self-pay | Admitting: Internal Medicine

## 2015-08-11 ENCOUNTER — Other Ambulatory Visit: Payer: Self-pay | Admitting: Internal Medicine

## 2015-08-23 ENCOUNTER — Emergency Department (HOSPITAL_COMMUNITY)
Admission: EM | Admit: 2015-08-23 | Discharge: 2015-08-23 | Disposition: A | Discharge status: Home / Self Care | Payer: Self-pay | Attending: Emergency Medicine | Admitting: Emergency Medicine

## 2015-08-23 ENCOUNTER — Emergency Department (HOSPITAL_COMMUNITY): Payer: Self-pay

## 2015-08-23 ENCOUNTER — Encounter (HOSPITAL_COMMUNITY): Payer: Self-pay | Admitting: Family Medicine

## 2015-08-23 DIAGNOSIS — I10 Essential (primary) hypertension: Secondary | ICD-10-CM | POA: Insufficient documentation

## 2015-08-23 DIAGNOSIS — Z79899 Other long term (current) drug therapy: Secondary | ICD-10-CM | POA: Insufficient documentation

## 2015-08-23 DIAGNOSIS — M25552 Pain in left hip: Secondary | ICD-10-CM

## 2015-08-23 DIAGNOSIS — Z8619 Personal history of other infectious and parasitic diseases: Secondary | ICD-10-CM | POA: Insufficient documentation

## 2015-08-23 DIAGNOSIS — M549 Dorsalgia, unspecified: Secondary | ICD-10-CM | POA: Insufficient documentation

## 2015-08-23 DIAGNOSIS — Z791 Long term (current) use of non-steroidal anti-inflammatories (NSAID): Secondary | ICD-10-CM | POA: Insufficient documentation

## 2015-08-23 DIAGNOSIS — Z8639 Personal history of other endocrine, nutritional and metabolic disease: Secondary | ICD-10-CM | POA: Insufficient documentation

## 2015-08-23 DIAGNOSIS — Z880 Allergy status to penicillin: Secondary | ICD-10-CM | POA: Insufficient documentation

## 2015-08-23 DIAGNOSIS — F17210 Nicotine dependence, cigarettes, uncomplicated: Secondary | ICD-10-CM | POA: Insufficient documentation

## 2015-08-23 MED ORDER — TRAMADOL HCL 50 MG PO TABS: 50.0000 mg | ORAL_TABLET | Freq: Four times a day (QID) | ORAL | Status: DC | PRN

## 2015-08-23 MED ORDER — METHOCARBAMOL 500 MG PO TABS: 500.0000 mg | ORAL_TABLET | Freq: Two times a day (BID) | ORAL | Status: DC

## 2015-08-23 NOTE — ED Notes (Signed)
Pt comfortable with discharge and follow up instructions. Pt declines wheelchair, escorted to waiting area by this RN. Rx x2 

## 2015-08-23 NOTE — ED Notes (Signed)
PT presents from home via POV with c/o left hip pain gradually worsening "for months".  She denies trauma/injury to the site. Ambulatory to treatment room

## 2015-08-23 NOTE — ED Provider Notes (Signed)
CSN: [id number]     Arrival date & time 08/23/15  1311 History  By signing my name below, I, Tanda Rockers, attest that this documentation has been prepared under the direction and in the presence of Gwendolen Hewlett, PA-C.  Electronically Signed: Tanda Rockers, ED Scribe. 08/23/2015. 2:27 PM.    Chief Complaint  Patient presents with  . Hip Pain   The history is provided by the patient. No language interpreter was used.   HPI Comments: Lynnell Renaldo is a 50 y.o. female who presents to the Emergency Department complaining of constant, gradually worsening left hip pain radiating into lower back and left thigh that she noticed 1 month ago. No known injury to the hip and pain is exacerbated upon movement. Pt says that she has taken Aleve, Advil, and goody powder for the pain with no relief. Denies weakness, numbness, tingling, or any other associated symptoms.   Past Medical History  Diagnosis Date  . Hypertension   . Tobacco abuse   . Cocaine abuse   . Scabies   . Hyperthyroidism   . Grave's disease     s/p radioiodine ablation 03/05/10, F/B Dr. Chestine Spore. On Methimazole  . Thyroid condition    Past Surgical History  Procedure Laterality Date  . Btl    . Mouth surgery     No family history on file. Social History  Substance Use Topics  . Smoking status: Current Every Day Smoker -- 0.25 packs/day    Types: Cigarettes  . Smokeless tobacco: None     Comment: 1 cig a day  . Alcohol Use: 0.6 - 1.2 oz/week    1-2 Cans of beer per week     Comment: occ   OB History    No data available     Review of Systems  Musculoskeletal: Positive for back pain and arthralgias (left hip).  Neurological: Negative for weakness and numbness.   Allergies  Penicillins  Home Medications   Prior to Admission medications   Medication Sig Start Date End Date Taking? Authorizing Provider  amLODipine (NORVASC) 10 MG tablet Take 1 tablet (10 mg total) by mouth daily. 11/28/14   Ambrose Finland,  NP  cyclobenzaprine (FLEXERIL) 10 MG tablet Take 1 tablet (10 mg total) by mouth 3 (three) times daily as needed for muscle spasms. 11/09/14   Ambrose Finland, NP  levothyroxine (SYNTHROID, LEVOTHROID) 75 MCG tablet Take 1 tablet (75 mcg total) by mouth daily. Patient not taking: Reported on 11/28/2014 11/20/14   Ambrose Finland, NP  losartan (COZAAR) 50 MG tablet Take 1 tablet (50 mg total) by mouth daily. For blood pressure 11/30/14   Ambrose Finland, NP  naproxen (NAPROSYN) 500 MG tablet Take 1 tablet (500 mg total) by mouth 2 (two) times daily with a meal. Must have office visit for refills 08/11/15   Quentin Angst, MD  polyethylene glycol powder (GLYCOLAX/MIRALAX) powder Take 17 g by mouth daily. 11/09/14   Ambrose Finland, NP  ranitidine (ZANTAC) 150 MG tablet Take 1 tablet (150 mg total) by mouth 2 (two) times daily. For acid reflux/bloating 11/30/14   Ambrose Finland, NP   BP 181/81 mmHg  Pulse 70  Temp(Src) 98.4 F (36.9 C) (Oral)  Resp 16  SpO2 98%  LMP 08/16/2015 Physical Exam  Constitutional: She is oriented to person, place, and time. She appears well-developed and well-nourished. No distress.  HENT:  Head: Normocephalic and atraumatic.  Eyes: Conjunctivae and EOM are normal.  Neck: Neck supple.  No tracheal deviation present.  Cardiovascular: Normal rate.   Pulmonary/Chest: Effort normal. No respiratory distress.  Musculoskeletal: Normal range of motion.  No midline lumbar spine tenderness. Mild tenderness to palpation in the left SI joint. Tender palpation in the left lateral and anterior hip. Full range of motion of the hip. Pain with internal/external rotation of the hip. No pain with flexion. Normal knee. DP pulse intact.  Neurological: She is alert and oriented to person, place, and time.  5/5 and equal lower extremity strength. 2+ and equal patellar reflexes bilaterally. Pt able to dorsiflex bilateral toes and feet with good strength against resistance. Equal sensation  bilaterally over thighs and lower legs.   Skin: Skin is warm and dry.  Psychiatric: She has a normal mood and affect. Her behavior is normal.  Nursing note and vitals reviewed.   ED Course  Procedures (including critical care time)  DIAGNOSTIC STUDIES: Oxygen Saturation is 98% on RA, normal by my interpretation.    COORDINATION OF CARE: 2:15 PM-Discussed treatment plan which includes DG left hip with pt at bedside and pt agreed to plan.   Labs Review Labs Reviewed - No data to display  Imaging Review Dg Hip Unilat With Pelvis 2-3 Views Left  08/23/2015  CLINICAL DATA:  Left hip pain for 1 month, worsening pain today, no known injury EXAM: DG HIP (WITH OR WITHOUT PELVIS) 2-3V LEFT COMPARISON:  None. FINDINGS: Three views of the left hip submitted. No acute fracture or subluxation. Bilateral hip joints are symmetrical in appearance. Pelvic phleboliths are noted. IMPRESSION: Negative. Electronically Signed   By: Natasha MeadLiviu  Pop M.D.   On: 08/23/2015 14:31   I have personally reviewed and evaluated these images as part of my medical decision-making.   EKG Interpretation None      MDM   Final diagnoses:  Left hip pain   Patient in emergency department with left hip pain. Pain for a month. No injuries. No neuro deficits or neurological complaints. X-ray obtained and is negative. I don't think the pain is radicular, based on her exam. I think her pain may be due to a muscular injury versus bursitis. We will continue anti-inflammatories, will add tramadol for severe pain and Robaxin for muscle spasms. I instructed patient to stretch, avoid strenuous activity, follow-up with family doctor if not improving. Return precautions discussed. Blood pressure elevated in emergency department, patient stated that she did not take her blood pressure medicines the last several days, but she has a minimal take him when she gets home. She is asymptomatic for her hypertension.  Filed Vitals:   08/23/15 1345   BP: 181/81  Pulse: 70  Temp: 98.4 F (36.9 C)  TempSrc: Oral  Resp: 16  SpO2: 98%    I personally performed the services described in this documentation, which was scribed in my presence. The recorded information has been reviewed and is accurate.    Jaynie Crumbleatyana Sana Tessmer, PA-C 08/23/15 1456  Lyndal Pulleyaniel Knott, MD 08/23/15 [phone]

## 2015-08-23 NOTE — Discharge Instructions (Signed)
Continue to take naprosyn. Take tramadol as prescribed for severe pain. Take robaxin for muscle spasms. Follow up with primary care doctor.   Hip Pain Your hip is the joint between your upper legs and your lower pelvis. The bones, cartilage, tendons, and muscles of your hip joint perform a lot of work each day supporting your body weight and allowing you to move around. Hip pain can range from a minor ache to severe pain in one or both of your hips. Pain may be felt on the inside of the hip joint near the groin, or the outside near the buttocks and upper thigh. You may have swelling or stiffness as well.  HOME CARE INSTRUCTIONS   Take medicines only as directed by your health care provider.  Apply ice to the injured area:  Put ice in a plastic bag.  Place a towel between your skin and the bag.  Leave the ice on for 15-20 minutes at a time, 3-4 times a day.  Keep your leg raised (elevated) when possible to lessen swelling.  Avoid activities that cause pain.  Follow specific exercises as directed by your health care provider.  Sleep with a pillow between your legs on your most comfortable side.  Record how often you have hip pain, the location of the pain, and what it feels like. SEEK MEDICAL CARE IF:   You are unable to put weight on your leg.  Your hip is red or swollen or very tender to touch.  Your pain or swelling continues or worsens after 1 week.  You have increasing difficulty walking.  You have a fever. SEEK IMMEDIATE MEDICAL CARE IF:   You have fallen.  You have a sudden increase in pain and swelling in your hip. MAKE SURE YOU:   Understand these instructions.  Will watch your condition.  Will get help right away if you are not doing well or get worse.   This information is not intended to replace advice given to you by your health care provider. Make sure you discuss any questions you have with your health care provider.   Document Released: 09/05/2009  Document Revised: 04/08/2014 Document Reviewed: 11/12/2012 Elsevier Interactive Patient Education [employer]2016 Elsevier Inc.

## 2015-10-11 ENCOUNTER — Other Ambulatory Visit: Payer: Self-pay | Admitting: Internal Medicine

## 2015-11-13 ENCOUNTER — Ambulatory Visit: Payer: Self-pay | Attending: Internal Medicine | Admitting: Internal Medicine

## 2015-11-13 ENCOUNTER — Encounter: Payer: Self-pay | Admitting: Internal Medicine

## 2015-11-13 VITALS — BP 209/109 | HR 67 | Temp 97.9°F | Resp 16 | Wt 183.8 lb

## 2015-11-13 DIAGNOSIS — Z9889 Other specified postprocedural states: Secondary | ICD-10-CM | POA: Insufficient documentation

## 2015-11-13 DIAGNOSIS — E0500 Thyrotoxicosis with diffuse goiter without thyrotoxic crisis or storm: Secondary | ICD-10-CM | POA: Insufficient documentation

## 2015-11-13 DIAGNOSIS — M5441 Lumbago with sciatica, right side: Secondary | ICD-10-CM

## 2015-11-13 DIAGNOSIS — E039 Hypothyroidism, unspecified: Secondary | ICD-10-CM | POA: Insufficient documentation

## 2015-11-13 DIAGNOSIS — Z79899 Other long term (current) drug therapy: Secondary | ICD-10-CM | POA: Insufficient documentation

## 2015-11-13 DIAGNOSIS — F17210 Nicotine dependence, cigarettes, uncomplicated: Secondary | ICD-10-CM | POA: Insufficient documentation

## 2015-11-13 DIAGNOSIS — E890 Postprocedural hypothyroidism: Secondary | ICD-10-CM

## 2015-11-13 DIAGNOSIS — I10 Essential (primary) hypertension: Secondary | ICD-10-CM

## 2015-11-13 DIAGNOSIS — Z880 Allergy status to penicillin: Secondary | ICD-10-CM | POA: Insufficient documentation

## 2015-11-13 DIAGNOSIS — M7989 Other specified soft tissue disorders: Secondary | ICD-10-CM | POA: Insufficient documentation

## 2015-11-13 DIAGNOSIS — Z23 Encounter for immunization: Secondary | ICD-10-CM

## 2015-11-13 MED ORDER — LORATADINE 10 MG PO TABS: 10.0000 mg | ORAL_TABLET | Freq: Every day | ORAL | 11 refills | Status: DC

## 2015-11-13 MED ORDER — NAPROXEN 500 MG PO TABS: 500.0000 mg | ORAL_TABLET | Freq: Three times a day (TID) | ORAL | 1 refills | Status: DC | PRN

## 2015-11-13 MED ORDER — CYCLOBENZAPRINE HCL 10 MG PO TABS: 10.0000 mg | ORAL_TABLET | Freq: Three times a day (TID) | ORAL | 1 refills | Status: DC | PRN

## 2015-11-13 MED ORDER — FLUTICASONE PROPIONATE 50 MCG/ACT NA SUSP: 2.0000 | Freq: Every day | NASAL | 6 refills | Status: DC

## 2015-11-13 MED ORDER — LOSARTAN POTASSIUM 50 MG PO TABS: 50.0000 mg | ORAL_TABLET | Freq: Every day | ORAL | 3 refills | Status: DC

## 2015-11-13 MED ORDER — CLONIDINE HCL 0.1 MG PO TABS
0.2000 mg | ORAL_TABLET | Freq: Once | ORAL | Status: AC
  Administered 2015-11-13: 0.2 mg via ORAL

## 2015-11-13 MED ORDER — LEVOTHYROXINE SODIUM 75 MCG PO TABS: 75.0000 ug | ORAL_TABLET | Freq: Every day | ORAL | 3 refills | Status: DC

## 2015-11-13 MED ORDER — AMLODIPINE BESYLATE 10 MG PO TABS: 10.0000 mg | ORAL_TABLET | Freq: Every day | ORAL | 2 refills | Status: DC

## 2015-11-13 MED FILL — LOSARTAN POTASSIUM 50 MG TA: 50 | 30 days supply | Qty: 30 | Fill #0

## 2015-11-13 MED FILL — AMLODIPINE BESYLATE 10 MG T: 10 | 30 days supply | Qty: 30 | Fill #0

## 2015-11-13 MED FILL — ?CYCLOBENZAPRINE 10 MG TABL: 10 | 10 days supply | Qty: 60 | Fill #0

## 2015-11-13 MED FILL — LEVOTHYROXINE 75 MCG TABLET: 75 | 30 days supply | Qty: 30 | Fill #0

## 2015-11-13 NOTE — Progress Notes (Signed)
Tylisa Rennis Hardingllis, is a 50 y.o. female  NGE:[id number]:[id number]  MWN:[id number]  DOB - [birth year]  CC:  Chief Complaint  Patient presents with  . Hypertension  . Establish Care       HPI: Camerin Rennis Hardingllis is a 50 y.o. female here today to establish medical care, w/ signif PMHX  Hypothyroidism (with hx of Grave's disease sp radioactiveidone ablation 11/11, hypertension, past drug use with cocaine for which she has sustained from resuming for several years.  She has been off all her meds for at least 3-574months.  She smokes a cigarette every few months. Denies etoh.  Eats a lot of fastfood, does not watch diet.  Has c/o of intermittent right lower back pain, which is radiating down to her right knee.  Her right knee feels swollen at times, and constant aches. She has tried icy-hot patches, icing it, w/o much relief.  Recent ED visit on 08/23/15 for same complaint, w/ negative hip films.  Patient has No headache, No chest pain, No abdominal pain - No Nausea/vomiting, No new weakness tingling or numbness, Gi c/o prior has improved. Denies ha/visual problems currently. No photophobia  +frequent allergies, nasal congestion. Takes otc meds sometimes, no f/c/cough/sob.    Review of Systems: Per HPI, o/w all systems reviewed and negative.  Allergies  Allergen Reactions  . Penicillins Swelling   Past Medical History:  Diagnosis Date  . Cocaine abuse   . Grave's disease    s/p radioiodine ablation 03/05/10, F/B Dr. Chestine Sporelark. On Methimazole  . Hypertension   . Hyperthyroidism   . Scabies   . Thyroid condition   . Tobacco abuse    Current Outpatient Prescriptions on File Prior to Visit  Medication Sig Dispense Refill  . methocarbamol (ROBAXIN) 500 MG tablet Take 1 tablet (500 mg total) by mouth 2 (two) times daily. 20 tablet 0  . traMADol (ULTRAM) 50 MG tablet Take 1 tablet (50 mg total) by mouth every 6 (six) hours as needed. 15 tablet 0  . polyethylene glycol powder (GLYCOLAX/MIRALAX) powder  Take 17 g by mouth daily. (Patient not taking: Reported on 11/13/2015) 850 g 1   No current facility-administered medications on file prior to visit.    No family history on file. Social History   Social History  . Marital status: Single    Spouse name: N/A  . Number of children: N/A  . Years of education: N/A   Occupational History  . Not on file.   Social History Main Topics  . Smoking status: Current Every Day Smoker    Packs/day: 0.25    Types: Cigarettes  . Smokeless tobacco: Not on file     Comment: 1 cig a day  . Alcohol use 0.6 - 1.2 oz/week    1 - 2 Cans of beer per week     Comment: occ  . Drug use:     Types: Marijuana     Comment: marijuana  . Sexual activity: Not on file   Other Topics Concern  . Not on file   Social History Narrative   Smokes 1 pack/week x 20 years, smokes marijuna, no cocaine in the last year.  Works for [employer].      [facility] and [facility] card Rudell CobbDeborah Hill Aug 01 2009 8.58am   [facility] for [facility] and [facility] February 02, 2010 12.20pm    Objective:   Vitals:   11/13/15 1102  BP: (!) 209/109  Pulse: 67  Resp: 16  Temp: 97.9 F (36.6 C)    Filed Weights   11/13/15 1102  Weight: 183 lb 12.8 oz (83.4 kg)    BP Readings from Last 3 Encounters:  11/13/15 (!) 209/109  08/23/15 (!) 202/90  07/10/15 157/81   Repeat bp after clonidine 0.2mg , 180/98  Physical Exam: Constitutional: Patient appears well-developed and well-nourished. No distress. AAOx3, tall appearing female, pleasant HENT: Normocephalic, atraumatic, External right and left ear normal. Oropharynx is clear and moist.  bilat TMS clear. bilat nares boggy, no frontal/maxillary ttp. Eyes: Conjunctivae and EOM are normal. PERRL, no scleral icterus. Neck: Normal ROM. Neck supple. No JVD. No tracheal deviation. No thyromegaly.  CVS: RRR, S1/S2 +, no murmurs, no  gallops, no carotid bruit.  Pulmonary: Effort and breath sounds normal, no stridor, rhonchi, wheezes, rales.  Abdominal: Soft. BS +, no distension, tenderness, rebound or guarding.  Musculoskeletal: Normal range of motion. No edema and no tenderness.   +crepitus right knee noted, nttp.  No obvious effusions/erythema/edema on bilat knees. Neg straight leg test bilaterally. LE: bilat/ no c/c/e, pulses 2+ bilateral. Neuro: Alert. Muscle tone coordination wnl. No cranial nerve deficit grossly. Skin: Skin is warm and dry. No rash noted. Not diaphoretic. No erythema. No pallor. Psychiatric: Normal mood and affect. Behavior, judgment, thought content normal.  Lab Results  Component Value Date   WBC 14.5 (H) 08/27/2013   HGB 13.2 08/27/2013   HCT 41.3 08/27/2013   MCV 70.6 (L) 08/27/2013   PLT 327 08/27/2013   Lab Results  Component Value Date   CREATININE 0.96 11/11/2014   BUN 14 11/11/2014   NA 142 11/11/2014   K 3.7 11/11/2014   CL 102 11/11/2014   CO2 27 11/11/2014    No results found for: HGBA1C Lipid Panel     Component Value Date/Time   CHOL 169 11/11/2014 1004   TRIG 93 11/11/2014 1004   HDL 75 11/11/2014 1004   CHOLHDL 2.3 11/11/2014 1004   VLDL 19 11/11/2014 1004   LDLCALC 75 11/11/2014 1004       Depression screen PHQ 2/9 11/13/2015 11/09/2014 02/18/2012 10/09/2011 08/15/2011  Decreased Interest 0 0 0 0 0  Down, Depressed, Hopeless 0 0 0 1 0  PHQ - 2 Score 0 0 0 1 0    Assessment and plan:   1. HTN (hypertension), malignant - no concerning s/es noted today on exam, Off meds for several months now, reitterrated importance of meds for stroke/mi prevention - low salt diet /DASH recd - clonidine 0.2mg  po x 1 today, repeat bp slightly improved,  - renewed: losartan (COZAAR) 50 MG tablet; Take 1 tablet (50 mg total) by mouth daily. For blood pressure  Dispense: 90 tablet; Refill: 3 - norvasc 10 qday renewed - f/u in 2 wks for bp check  3. Right-sided low back pain  with right-sided sciatica - cyclobenzaprine (FLEXERIL) 10 MG tablet; Take 1 tablet (10 mg total) by mouth 3 (three) times daily as needed for muscle spasms.  Dispense: 60 tablet; Refill: 1 - renewed naproxyn - recd back stretches, RICE/warm heat,  4. Hypothyroidism status post radioactive iodine ablation for Graves' disease back in December 2011 - off meds for several months - will resume synthroid qday for now, rechk levels in 5-6 wks  5. Financial aid packet  Recd to pick up  6. Health maintenance Recd sign up w/ financial aid packet, needs MM and colonscopy wants gets TDAP today Due for  papsmear as well, recd do when I see again for htn in few weeks.  Return in about 2 weeks (around 11/27/2015) for htn / pap smear.  The patient was given clear instructions to go to ER or return to medical center if symptoms don't improve, worsen or new problems develop. The patient verbalized understanding. The patient was told to call to get lab results if they haven't heard anything in the next week.    This note has been created with Education officer, environmentalDragon speech recognition software and smart phrase technology. Any transcriptional errors are unintentional.   Pete Glatterawn T Langeland, MD, MBA/MHA [facility] [place], [place] [phone]   11/13/2015, 11:58 AM

## 2015-11-13 NOTE — Progress Notes (Signed)
Pt is in the office today for establish care Pt states states her pain level today in the office is a 6 Pt states her pain is coming from her right knee Pt states she has been taking otc medicine for her pain and nothing has helped Pt states she hasn't taking her bp medicine in a month Pt denies having chest pain, numbness, sob and tingling Pt states she needs a refill on all her meds Pt states she thinks she is going through menopause

## 2015-11-13 NOTE — Patient Instructions (Addendum)
Back Exercises The following exercises strengthen the muscles that help to support the back. They also help to keep the lower back flexible. Doing these exercises can help to prevent back pain or lessen existing pain. If you have back pain or discomfort, try doing these exercises 2-3 times each day or as told by your health care provider. When the pain goes away, do them once each day, but increase the number of times that you repeat the steps for each exercise (do more repetitions). If you do not have back pain or discomfort, do these exercises once each day or as told by your health care provider. EXERCISES Single Knee to Chest Repeat these steps 3-5 times for each leg: 1. Lie on your back on a firm bed or the floor with your legs extended. 2. Bring one knee to your chest. Your other leg should stay extended and in contact with the floor. 3. Hold your knee in place by grabbing your knee or thigh. 4. Pull on your knee until you feel a gentle stretch in your lower back. 5. Hold the stretch for 10-30 seconds. 6. Slowly release and straighten your leg. Pelvic Tilt Repeat these steps 5-10 times: 1. Lie on your back on a firm bed or the floor with your legs extended. 2. Bend your knees so they are pointing toward the ceiling and your feet are flat on the floor. 3. Tighten your lower abdominal muscles to press your lower back against the floor. This motion will tilt your pelvis so your tailbone points up toward the ceiling instead of pointing to your feet or the floor. 4. With gentle tension and even breathing, hold this position for 5-10 seconds. Cat-Cow Repeat these steps until your lower back becomes more flexible: 1. Get into a hands-and-knees position on a firm surface. Keep your hands under your shoulders, and keep your knees under your hips. You may place padding under your knees for comfort. 2. Let your head hang down, and point your tailbone toward the floor so your lower back becomes  rounded like the back of a cat. 3. Hold this position for 5 seconds. 4. Slowly lift your head and point your tailbone up toward the ceiling so your back forms a sagging arch like the back of a cow. 5. Hold this position for 5 seconds. Press-Ups Repeat these steps 5-10 times: 1. Lie on your abdomen (face-down) on the floor. 2. Place your palms near your head, about shoulder-width apart. 3. While you keep your back as relaxed as possible and keep your hips on the floor, slowly straighten your arms to raise the top half of your body and lift your shoulders. Do not use your back muscles to raise your upper torso. You may adjust the placement of your hands to make yourself more comfortable. 4. Hold this position for 5 seconds while you keep your back relaxed. 5. Slowly return to lying flat on the floor. Bridges Repeat these steps 10 times: 1. Lie on your back on a firm surface. 2. Bend your knees so they are pointing toward the ceiling and your feet are flat on the floor. 3. Tighten your buttocks muscles and lift your buttocks off of the floor until your waist is at almost the same height as your knees. You should feel the muscles working in your buttocks and the back of your thighs. If you do not feel these muscles, slide your feet 1-2 inches farther away from your buttocks. 4. Hold this position for 3-5   seconds. 5. Slowly lower your hips to the starting position, and allow your buttocks muscles to relax completely. If this exercise is too easy, try doing it with your arms crossed over your chest. Abdominal Crunches Repeat these steps 5-10 times: 1. Lie on your back on a firm bed or the floor with your legs extended. 2. Bend your knees so they are pointing toward the ceiling and your feet are flat on the floor. 3. Cross your arms over your chest. 4. Tip your chin slightly toward your chest without bending your neck. 5. Tighten your abdominal muscles and slowly raise your trunk (torso) high  enough to lift your shoulder blades a tiny bit off of the floor. Avoid raising your torso higher than that, because it can put too much stress on your low back and it does not help to strengthen your abdominal muscles. 6. Slowly return to your starting position. Back Lifts Repeat these steps 5-10 times: 1. Lie on your abdomen (face-down) with your arms at your sides, and rest your forehead on the floor. 2. Tighten the muscles in your legs and your buttocks. 3. Slowly lift your chest off of the floor while you keep your hips pressed to the floor. Keep the back of your head in line with the curve in your back. Your eyes should be looking at the floor. 4. Hold this position for 3-5 seconds. 5. Slowly return to your starting position. SEEK MEDICAL CARE IF:  Your back pain or discomfort gets much worse when you do an exercise.  Your back pain or discomfort does not lessen within 2 hours after you exercise. If you have any of these problems, stop doing these exercises right away. Do not do them again unless your health care provider says that you can. SEEK IMMEDIATE MEDICAL CARE IF:  You develop sudden, severe back pain. If this happens, stop doing the exercises right away. Do not do them again unless your health care provider says that you can.   This information is not intended to replace advice given to you by your health care provider. Make sure you discuss any questions you have with your health care provider.   Document Released: 04/25/2004 Document Revised: 12/07/2014 Document Reviewed: 05/12/2014 Elsevier Interactive Patient Education 2016 Elsevier Inc.    -  RICE for Routine Care of Injuries Many injuries can be cared for using rest, ice, compression, and elevation (RICE therapy). Using RICE therapy can help to lessen pain and swelling. It can help your body to heal. Rest Reduce your normal activities and avoid using the injured part of your body. You can go back to your normal  activities when you feel okay and your doctor says it is okay. Ice Do not put ice on your bare skin.  Put ice in a plastic bag.  Place a towel between your skin and the bag.  Leave the ice on for 20 minutes, 2-3 times a day. Do this for as long as told by your doctor. Compression Compression means putting pressure on the injured area. This can be done with an elastic bandage. If an elastic bandage has been applied:  Remove and reapply the bandage every 3-4 hours or as told by your doctor.  Make sure the bandage is not wrapped too tight. Wrap the bandage more loosely if part of your body beyond the bandage is blue, swollen, cold, painful, or loses feeling (numb).  See your doctor if the bandage seems to make your problems worse. Elevation Elevation  means keeping the injured area raised. Raise the injured area above your heart or the center of your chest if you can. WHEN SHOULD I GET HELP? You should get help if:  You keep having pain and swelling.  Your symptoms get worse. WHEN SHOULD I GET HELP RIGHT AWAY? You should get help right away if:  You have sudden bad pain at or below the area of your injury.  You have redness or more swelling around your injury.  You have tingling or numbness at or below the injury that does not go away when you take off the bandage.   This information is not intended to replace advice given to you by your health care provider. Make sure you discuss any questions you have with your health care provider.   Document Released: 09/04/2007 Document Revised: 12/07/2014 Document Reviewed: 02/23/2014 Elsevier Interactive Patient Education 2016 Elsevier Inc.   - DASH Eating Plan DASH stands for "Dietary Approaches to Stop Hypertension." The DASH eating plan is a healthy eating plan that has been shown to reduce high blood pressure (hypertension). Additional health benefits may include reducing the risk of type 2 diabetes mellitus, heart disease, and  stroke. The DASH eating plan may also help with weight loss. WHAT DO I NEED TO KNOW ABOUT THE DASH EATING PLAN? For the DASH eating plan, you will follow these general guidelines:  Choose foods with a percent daily value for sodium of less than 5% (as listed on the food label).  Use salt-free seasonings or herbs instead of table salt or sea salt.  Check with your health care provider or pharmacist before using salt substitutes.  Eat lower-sodium products, often labeled as "lower sodium" or "no salt added."  Eat fresh foods.  Eat more vegetables, fruits, and low-fat dairy products.  Choose whole grains. Look for the word "whole" as the first word in the ingredient list.  Choose fish and skinless chicken or [place] more often than red meat. Limit fish, poultry, and meat to 6 oz (170 g) each day.  Limit sweets, desserts, sugars, and sugary drinks.  Choose heart-healthy fats.  Limit cheese to 1 oz (28 g) per day.  Eat more home-cooked food and less restaurant, buffet, and fast food.  Limit fried foods.  Cook foods using methods other than frying.  Limit canned vegetables. If you do use them, rinse them well to decrease the sodium.  When eating at a restaurant, ask that your food be prepared with less salt, or no salt if possible. WHAT FOODS CAN I EAT? Seek help from a dietitian for individual calorie needs. Grains Whole grain or whole wheat bread. Brown rice. Whole grain or whole wheat pasta. Quinoa, bulgur, and whole grain cereals. Low-sodium cereals. Corn or whole wheat flour tortillas. Whole grain cornbread. Whole grain crackers. Low-sodium crackers. Vegetables Fresh or frozen vegetables (raw, steamed, roasted, or grilled). Low-sodium or reduced-sodium tomato and vegetable juices. Low-sodium or reduced-sodium tomato sauce and paste. Low-sodium or reduced-sodium canned vegetables.  Fruits All fresh, canned (in natural juice), or frozen fruits. Meat and Other Protein  Products Ground beef (85% or leaner), grass-fed beef, or beef trimmed of fat. Skinless chicken or [place]. Ground chicken or [place]. Pork trimmed of fat. All fish and seafood. Eggs. Dried beans, peas, or lentils. Unsalted nuts and seeds. Unsalted canned beans. Dairy Low-fat dairy products, such as skim or 1% milk, 2% or reduced-fat cheeses, low-fat ricotta or cottage cheese, or plain low-fat yogurt. Low-sodium or reduced-sodium cheeses. Fats and Oils  Tub margarines without trans fats. Light or reduced-fat mayonnaise and salad dressings (reduced sodium). Avocado. Safflower, olive, or canola oils. Natural peanut or almond butter. Other Unsalted popcorn and pretzels. The items listed above may not be a complete list of recommended foods or beverages. Contact your dietitian for more options. WHAT FOODS ARE NOT RECOMMENDED? Grains White bread. White pasta. White rice. Refined cornbread. Bagels and croissants. Crackers that contain trans fat. Vegetables Creamed or fried vegetables. Vegetables in a cheese sauce. Regular canned vegetables. Regular canned tomato sauce and paste. Regular tomato and vegetable juices. Fruits Dried fruits. Canned fruit in light or heavy syrup. Fruit juice. Meat and Other Protein Products Fatty cuts of meat. Ribs, chicken wings, bacon, sausage, bologna, salami, chitterlings, fatback, hot dogs, bratwurst, and packaged luncheon meats. Salted nuts and seeds. Canned beans with salt. Dairy Whole or 2% milk, cream, half-and-half, and cream cheese. Whole-fat or sweetened yogurt. Full-fat cheeses or blue cheese. Nondairy creamers and whipped toppings. Processed cheese, cheese spreads, or cheese curds. Condiments Onion and garlic salt, seasoned salt, table salt, and sea salt. Canned and packaged gravies. Worcestershire sauce. Tartar sauce. Barbecue sauce. Teriyaki sauce. Soy sauce, including reduced sodium. Steak sauce. Fish sauce. Oyster sauce. Cocktail sauce. Horseradish. Ketchup and  mustard. Meat flavorings and tenderizers. Bouillon cubes. Hot sauce. Tabasco sauce. Marinades. Taco seasonings. Relishes. Fats and Oils Butter, stick margarine, lard, shortening, ghee, and bacon fat. Coconut, palm kernel, or palm oils. Regular salad dressings. Other Pickles and olives. Salted popcorn and pretzels. The items listed above may not be a complete list of foods and beverages to avoid. Contact your dietitian for more information. WHERE CAN I FIND MORE INFORMATION? National Heart, Lung, and Blood Institute: CablePromo.it   This information is not intended to replace advice given to you by your health care provider. Make sure you discuss any questions you have with your health care provider.   Document Released: 03/07/2011 Document Revised: 04/08/2014 Document Reviewed: 01/20/2013 Elsevier Interactive Patient Education 2016 [employer]. -    Hipertensin (Hypertension) El trmino hipertensin es otra forma de denominar a la presin arterial elevada. La presin arterial elevada fuerza al corazn a trabajar ms para bombear la sangre. Una lectura de la presin arterial consta de dos nmeros: uno ms alto sobre uno ms bajo (por ejemplo, 110/72). CUIDADOS EN EL HOGAR   Haga que el mdico le tome nuevamente la presin arterial.  Tome los medicamentos solamente como se lo haya indicado el mdico. Siga cuidadosamente las indicaciones. Los medicamentos pierden eficacia si omite dosis. El hecho de omitir las dosis tambin [place] el riesgo de otros problemas.  No fume.  Contrlese la presin arterial en su casa como se lo haya indicado el mdico. SOLICITE AYUDA SI:  Piensa que tiene una reaccin a los medicamentos que est tomando.  Tiene mareos o dolores de cabeza reiterados.  Se le inflaman (hinchan) los tobillos.  Tiene problemas de visin. SOLICITE AYUDA DE INMEDIATO SI:   Tiene un dolor de cabeza muy intenso y est confundido.  Se  siente dbil, aturdido o se desmaya.  Tiene dolor en el pecho o el estmago (abdominal).  Tiene vmitos.  No puede respirar [employer]. ASEGRESE DE QUE:   Comprende estas instrucciones.  Controlar su afeccin.  Recibir ayuda de inmediato si no mejora o si empeora.   Esta informacin no tiene Theme park manager el consejo del mdico. Asegrese de hacerle al mdico cualquier pregunta que tenga.   Document Released: 09/05/2009 Document Revised: 03/23/2013 Elsevier Interactive  Patient Education 2016 [employer] Tdap Vaccine (Tetanus, Diphtheria and Pertussis): What You Need to Know 1. Why get vaccinated? Tetanus, diphtheria and pertussis are very serious diseases. Tdap vaccine can protect us from these diseases. And, Tdap vaccine given to pregnant women can protect newborn babies against pertussis. TETANUS (Lockjaw) is rare in the [place] today. It causes painful muscle tightening and stiffness, usually all over the body.  It can lead to tightening of muscles in the head and neck so you can't open your mouth, swallow, or sometimes even breathe. Tetanus kills about 1 out of 10 people who are infected even after receiving the best medical care. DIPHTHERIA is also rare in the [place] today. It can cause a thick coating to form in the back of the throat.  It can lead to breathing problems, heart failure, paralysis, and death. PERTUSSIS (Whooping Cough) causes severe coughing spells, which can cause difficulty breathing, vomiting and disturbed sleep.  It can also lead to weight loss, incontinence, and rib fractures. Up to 2 in 100 adolescents and 5 in 100 adults with pertussis are hospitalized or have complications, which could include pneumonia or death. These diseases are caused by bacteria. Diphtheria and pertussis are spread from person to person through secretions from coughing or sneezing. Tetanus enters the body through cuts, scratches, or wounds. Before vaccines, as  many as 200,000 cases of diphtheria, 200,000 cases of pertussis, and hundreds of cases of tetanus, were reported in the [place] each year. Since vaccination began, reports of cases for tetanus and diphtheria have dropped by about 99% and for pertussis by about 80%. 2. Tdap vaccine Tdap vaccine can protect adolescents and adults from tetanus, diphtheria, and pertussis. One dose of Tdap is routinely given at age 52 or 3112. People who did not get Tdap at that age should get it as soon as possible. Tdap is especially important for healthcare professionals and anyone having close contact with a baby younger than 12 months. Pregnant women should get a dose of Tdap during every pregnancy, to protect the newborn from pertussis. Infants are most at risk for severe, life-threatening complications from pertussis. Another vaccine, called Td, protects against tetanus and diphtheria, but not pertussis. A Td booster should be given every 10 years. Tdap may be given as one of these boosters if you have never gotten Tdap before. Tdap may also be given after a severe cut or burn to prevent tetanus infection. Your doctor or the person giving you the vaccine can give you more information. Tdap may safely be given at the same time as other vaccines. 3. Some people should not get this vaccine  A person who has ever had a life-threatening allergic reaction after a previous dose of any diphtheria, tetanus or pertussis containing vaccine, OR has a severe allergy to any part of this vaccine, should not get Tdap vaccine. Tell the person giving the vaccine about any severe allergies.  Anyone who had coma or long repeated seizures within 7 days after a childhood dose of DTP or DTaP, or a previous dose of Tdap, should not get Tdap, unless a cause other than the vaccine was found. They can still get Td.  Talk to your doctor if you:  have seizures or another nervous system problem,  had severe pain or swelling after any  vaccine containing diphtheria, tetanus or pertussis,  ever had a condition called Guillain-Barr Syndrome (GBS),  aren't feeling well on the day the shot is scheduled. 4. Risks With  any medicine, including vaccines, there is a chance of side effects. These are usually mild and go away on their own. Serious reactions are also possible but are rare. Most people who get Tdap vaccine do not have any problems with it. Mild problems following Tdap (Did not interfere with activities)  Pain where the shot was given (about 3 in 4 adolescents or 2 in 3 adults)  Redness or swelling where the shot was given (about 1 person in 5)  Mild fever of at least 100.23F (up to about 1 in 25 adolescents or 1 in 100 adults)  Headache (about 3 or 4 people in 10)  Tiredness (about 1 person in 3 or 4)  Nausea, vomiting, diarrhea, stomach ache (up to 1 in 4 adolescents or 1 in 10 adults)  Chills, sore joints (about 1 person in 10)  Body aches (about 1 person in 3 or 4)  Rash, swollen glands (uncommon) Moderate problems following Tdap (Interfered with activities, but did not require medical attention)  Pain where the shot was given (up to 1 in 5 or 6)  Redness or swelling where the shot was given (up to about 1 in 16 adolescents or 1 in 12 adults)  Fever over 102F (about 1 in 100 adolescents or 1 in 250 adults)  Headache (about 1 in 7 adolescents or 1 in 10 adults)  Nausea, vomiting, diarrhea, stomach ache (up to 1 or 3 people in 100)  Swelling of the entire arm where the shot was given (up to about 1 in 500). Severe problems following Tdap (Unable to perform usual activities; required medical attention)  Swelling, severe pain, bleeding and redness in the arm where the shot was given (rare). Problems that could happen after any vaccine:  People sometimes faint after a medical procedure, including vaccination. Sitting or lying down for about 15 minutes can help prevent fainting, and injuries  caused by a fall. Tell your doctor if you feel dizzy, or have vision changes or ringing in the ears.  Some people get severe pain in the shoulder and have difficulty moving the arm where a shot was given. This happens very rarely.  Any medication can cause a severe allergic reaction. Such reactions from a vaccine are very rare, estimated at fewer than 1 in a million doses, and would happen within a few minutes to a few hours after the vaccination. As with any medicine, there is a very remote chance of a vaccine causing a serious injury or death. The safety of vaccines is always being monitored. For more information, visit: http://floyd.org/ 5. What if there is a serious problem? What should I look for?  Look for anything that concerns you, such as signs of a severe allergic reaction, very high fever, or unusual behavior.  Signs of a severe allergic reaction can include hives, swelling of the face and throat, difficulty breathing, a fast heartbeat, dizziness, and weakness. These would usually start a few minutes to a few hours after the vaccination. What should I do?  If you think it is a severe allergic reaction or other emergency that can't wait, call 9-1-1 or get the person to the nearest hospital. Otherwise, call your doctor.  Afterward, the reaction should be reported to the Vaccine Adverse Event Reporting System (VAERS). Your doctor might file this report, or you can do it yourself through the VAERS web site at www.vaers.LAgents.no, or by calling 1-670-294-9888. VAERS does not give medical advice.  6. The National Vaccine Injury Kohl's The  National Scientist, forensic (VICP) is a Stage manager that was created to compensate people who may have been injured by certain vaccines. Persons who believe they may have been injured by a vaccine can learn about the program and about filing a claim by calling 1-623-270-2654 or visiting the VICP website at  SpiritualWord.at. There is a time limit to file a claim for compensation. 7. How can I learn more?  Ask your doctor. He or she can give you the vaccine package insert or suggest other sources of information.  Call your local or state health department.  Contact the Centers for Disease Control and Prevention (CDC):  Call (239)052-9559 (1-800-CDC-INFO) or  Visit CDC's website at PicCapture.uy CDC Tdap Vaccine VIS (05/25/13)   This information is not intended to replace advice given to you by your health care provider. Make sure you discuss any questions you have with your health care provider.   Document Released: 09/17/2011 Document Revised: 04/08/2014 Document Reviewed: 06/30/2013 Elsevier Interactive Patient Education [employer]

## 2015-11-14 ENCOUNTER — Other Ambulatory Visit: Payer: Self-pay | Admitting: Internal Medicine

## 2015-11-14 ENCOUNTER — Telehealth: Payer: Self-pay

## 2015-11-14 MED ORDER — TRAMADOL HCL 50 MG PO TABS: 50.0000 mg | ORAL_TABLET | Freq: Four times a day (QID) | ORAL | 0 refills | Status: DC | PRN

## 2015-11-14 NOTE — Telephone Encounter (Signed)
Contacted pt to make aware that her rx for tramadol is ready for pick-up also she will need to sign the pain contract. Pt states she will come by tomorrow to pick -up rx

## 2015-11-16 MED FILL — traMADol HCL 50 MG TABS: 50 | 11 days supply | Qty: 45 | Fill #0

## 2015-12-11 ENCOUNTER — Encounter: Payer: Self-pay | Admitting: Internal Medicine

## 2015-12-11 ENCOUNTER — Ambulatory Visit: Payer: Self-pay | Attending: Internal Medicine | Admitting: Internal Medicine

## 2015-12-11 VITALS — BP 179/99 | HR 74 | Temp 97.4°F | Resp 16 | Wt 187.4 lb

## 2015-12-11 DIAGNOSIS — M25561 Pain in right knee: Secondary | ICD-10-CM

## 2015-12-11 DIAGNOSIS — Z124 Encounter for screening for malignant neoplasm of cervix: Secondary | ICD-10-CM | POA: Insufficient documentation

## 2015-12-11 DIAGNOSIS — I10 Essential (primary) hypertension: Secondary | ICD-10-CM

## 2015-12-11 MED ORDER — TRAMADOL HCL 50 MG PO TABS: 50.0000 mg | ORAL_TABLET | Freq: Four times a day (QID) | ORAL | 0 refills | Status: DC | PRN

## 2015-12-11 MED ORDER — LOSARTAN POTASSIUM 100 MG PO TABS: 100.0000 mg | ORAL_TABLET | Freq: Every day | ORAL | 3 refills | Status: DC

## 2015-12-11 MED ORDER — DICLOFENAC SODIUM 1 % TD GEL: 2.0000 g | Freq: Four times a day (QID) | TRANSDERMAL | 2 refills | Status: DC

## 2015-12-11 NOTE — Progress Notes (Signed)
Pt is in the office today for hypertension and pap  187.4

## 2015-12-11 NOTE — Patient Instructions (Signed)
Hypertension Hypertension is another name for high blood pressure. High blood pressure forces your heart to work harder to pump blood. A blood pressure reading has two numbers, which includes a higher number over a lower number (example: 110/72). HOME CARE   Have your blood pressure rechecked by your doctor.  Only take medicine as told by your doctor. Follow the directions carefully. The medicine does not work as well if you skip doses. Skipping doses also puts you at risk for problems.  Do not smoke.  Monitor your blood pressure at home as told by your doctor. GET HELP IF:  You think you are having a reaction to the medicine you are taking.  You have repeat headaches or feel dizzy.  You have puffiness (swelling) in your ankles.  You have trouble with your vision. GET HELP RIGHT AWAY IF:   You get a very bad headache and are confused.  You feel weak, numb, or faint.  You get chest or belly (abdominal) pain.  You throw up (vomit).  You cannot breathe very well. MAKE SURE YOU:   Understand these instructions.  Will watch your condition.  Will get help right away if you are not doing well or get worse.   This information is not intended to replace advice given to you by your health care provider. Make sure you discuss any questions you have with your health care provider.   Document Released: 09/04/2007 Document Revised: 03/23/2013 Document Reviewed: 01/08/2013 Elsevier Interactive Patient Education 2016 Elsevier Inc.  - DASH Eating Plan DASH stands for "Dietary Approaches to Stop Hypertension." The DASH eating plan is a healthy eating plan that has been shown to reduce high blood pressure (hypertension). Additional health benefits may include reducing the risk of type 2 diabetes mellitus, heart disease, and stroke. The DASH eating plan may also help with weight loss. WHAT DO I NEED TO KNOW ABOUT THE DASH EATING PLAN? For the DASH eating plan, you will follow these  general guidelines:  Choose foods with a percent daily value for sodium of less than 5% (as listed on the food label).  Use salt-free seasonings or herbs instead of table salt or sea salt.  Check with your health care provider or pharmacist before using salt substitutes.  Eat lower-sodium products, often labeled as "lower sodium" or "no salt added."  Eat fresh foods.  Eat more vegetables, fruits, and low-fat dairy products.  Choose whole grains. Look for the word "whole" as the first word in the ingredient list.  Choose fish and skinless chicken or turkey more often than red meat. Limit fish, poultry, and meat to 6 oz (170 g) each day.  Limit sweets, desserts, sugars, and sugary drinks.  Choose heart-healthy fats.  Limit cheese to 1 oz (28 g) per day.  Eat more home-cooked food and less restaurant, buffet, and fast food.  Limit fried foods.  Cook foods using methods other than frying.  Limit canned vegetables. If you do use them, rinse them well to decrease the sodium.  When eating at a restaurant, ask that your food be prepared with less salt, or no salt if possible. WHAT FOODS CAN I EAT? Seek help from a dietitian for individual calorie needs. Grains Whole grain or whole wheat bread. Brown rice. Whole grain or whole wheat pasta. Quinoa, bulgur, and whole grain cereals. Low-sodium cereals. Corn or whole wheat flour tortillas. Whole grain cornbread. Whole grain crackers. Low-sodium crackers. Vegetables Fresh or frozen vegetables (raw, steamed, roasted, or grilled). Low-sodium or   reduced-sodium tomato and vegetable juices. Low-sodium or reduced-sodium tomato sauce and paste. Low-sodium or reduced-sodium canned vegetables.  Fruits All fresh, canned (in natural juice), or frozen fruits. Meat and Other Protein Products Ground beef (85% or leaner), grass-fed beef, or beef trimmed of fat. Skinless chicken or turkey. Ground chicken or turkey. Pork trimmed of fat. All fish and  seafood. Eggs. Dried beans, peas, or lentils. Unsalted nuts and seeds. Unsalted canned beans. Dairy Low-fat dairy products, such as skim or 1% milk, 2% or reduced-fat cheeses, low-fat ricotta or cottage cheese, or plain low-fat yogurt. Low-sodium or reduced-sodium cheeses. Fats and Oils Tub margarines without trans fats. Light or reduced-fat mayonnaise and salad dressings (reduced sodium). Avocado. Safflower, olive, or canola oils. Natural peanut or almond butter. Other Unsalted popcorn and pretzels. The items listed above may not be a complete list of recommended foods or beverages. Contact your dietitian for more options. WHAT FOODS ARE NOT RECOMMENDED? Grains White bread. White pasta. White rice. Refined cornbread. Bagels and croissants. Crackers that contain trans fat. Vegetables Creamed or fried vegetables. Vegetables in a cheese sauce. Regular canned vegetables. Regular canned tomato sauce and paste. Regular tomato and vegetable juices. Fruits Dried fruits. Canned fruit in light or heavy syrup. Fruit juice. Meat and Other Protein Products Fatty cuts of meat. Ribs, chicken wings, bacon, sausage, bologna, salami, chitterlings, fatback, hot dogs, bratwurst, and packaged luncheon meats. Salted nuts and seeds. Canned beans with salt. Dairy Whole or 2% milk, cream, half-and-half, and cream cheese. Whole-fat or sweetened yogurt. Full-fat cheeses or blue cheese. Nondairy creamers and whipped toppings. Processed cheese, cheese spreads, or cheese curds. Condiments Onion and garlic salt, seasoned salt, table salt, and sea salt. Canned and packaged gravies. Worcestershire sauce. Tartar sauce. Barbecue sauce. Teriyaki sauce. Soy sauce, including reduced sodium. Steak sauce. Fish sauce. Oyster sauce. Cocktail sauce. Horseradish. Ketchup and mustard. Meat flavorings and tenderizers. Bouillon cubes. Hot sauce. Tabasco sauce. Marinades. Taco seasonings. Relishes. Fats and Oils Butter, stick margarine,  lard, shortening, ghee, and bacon fat. Coconut, palm kernel, or palm oils. Regular salad dressings. Other Pickles and olives. Salted popcorn and pretzels. The items listed above may not be a complete list of foods and beverages to avoid. Contact your dietitian for more information. WHERE CAN I FIND MORE INFORMATION? National Heart, Lung, and Blood Institute: www.nhlbi.nih.gov/health/health-topics/topics/dash/   This information is not intended to replace advice given to you by your health care provider. Make sure you discuss any questions you have with your health care provider.   Document Released: 03/07/2011 Document Revised: 04/08/2014 Document Reviewed: 01/20/2013 Elsevier Interactive Patient Education 2016 Elsevier Inc.    

## 2015-12-11 NOTE — Progress Notes (Signed)
Ann Jarvis, is a 50 y.o. female  [id number]  [id number]  DOB - April 09, 1965  Chief Complaint  Patient presents with  . Hypertension  . Gynecologic Exam        Subjective:   Ann Jarvis is a 50 y.o. female here today for a follow up visit.  Her for htn and papsmear.  Last menses about 2-3 months ago, spotting only at time. Denies noted abnml vag or nipple discharge. Has bf, but not very sexually active.    Taking her bp meds now. Denies added salt, but her daughter does cooking sometimes, and may add salt.  They had boiled pig feet yesterday w/ salsa/salt, etc,  C/o of intermittent right knee bothering her. Ultram helps, but she is out of it. Still has a lot of flexeril at home.  Patient has No headache, No chest pain, No abdominal pain - No Nausea, No new weakness tingling or numbness, No Cough - SOB.  No problems updated.  ALLERGIES: Allergies  Allergen Reactions  . Penicillins Swelling    PAST MEDICAL HISTORY: Past Medical History:  Diagnosis Date  . Cocaine abuse   . Grave's disease    s/p radioiodine ablation 03/05/10, F/B Dr. Chestine Sporelark. On Methimazole  . Hypertension   . Hyperthyroidism   . Scabies   . Thyroid condition   . Tobacco abuse     MEDICATIONS AT HOME: Prior to Admission medications   Medication Sig Start Date End Date Taking? Authorizing Provider  amLODipine (NORVASC) 10 MG tablet Take 1 tablet (10 mg total) by mouth daily. Must have office visit for refills 11/13/15   Pete Glatterawn T Mykeisha Dysert, MD  cyclobenzaprine (FLEXERIL) 10 MG tablet Take 1 tablet (10 mg total) by mouth 3 (three) times daily as needed for muscle spasms. 11/13/15   Pete Glatterawn T Leslie Jester, MD  diclofenac sodium (VOLTAREN) 1 % GEL Apply 2 g topically 4 (four) times daily. 12/11/15   Pete Glatterawn T Tandi Hanko, MD  fluticasone (FLONASE) 50 MCG/ACT nasal spray Place 2 sprays into both nostrils daily. 11/13/15   Pete Glatterawn T Dmarius Reeder, MD  levothyroxine (SYNTHROID, LEVOTHROID) 75 MCG tablet Take 1  tablet (75 mcg total) by mouth daily. Must have office visit for refills 11/13/15   Pete Glatterawn T Dorianna Mckiver, MD  loratadine (CLARITIN) 10 MG tablet Take 1 tablet (10 mg total) by mouth daily. 11/13/15   Pete Glatterawn T Tamarra Geiselman, MD  losartan (COZAAR) 100 MG tablet Take 1 tablet (100 mg total) by mouth daily. For blood pressure 12/11/15   Pete Glatterawn T Makena Mcgrady, MD  methocarbamol (ROBAXIN) 500 MG tablet Take 1 tablet (500 mg total) by mouth 2 (two) times daily. 08/23/15   Tatyana Kirichenko, PA-C  naproxen (NAPROSYN) 500 MG tablet Take 1 tablet (500 mg total) by mouth 3 (three) times daily as needed. Must have office visit for refills 11/13/15   Pete Glatterawn T Noeli Lavery, MD  polyethylene glycol powder (GLYCOLAX/MIRALAX) powder Take 17 g by mouth daily. Patient not taking: Reported on 11/13/2015 11/09/14   Ambrose FinlandValerie A Keck, NP  traMADol (ULTRAM) 50 MG tablet Take 1 tablet (50 mg total) by mouth every 6 (six) hours as needed for severe pain. 12/11/15   Pete Glatterawn T Elijiah Mickley, MD     Objective:   Vitals:   12/11/15 1159  BP: (!) 179/99  Pulse: 74  Resp: 16  Temp: 97.4 F (36.3 C)  TempSrc: Oral  SpO2: 96%  Weight: 187 lb 6.4 oz (85 kg)   Breast/pelvic exam done w/ cma assistance.  Exam General appearance : Awake,  alert, not in any distress. Speech Clear. Not toxic looking, pleasant. HEENT: Atraumatic and Normocephalic, pupils equally reactive to light. Neck: supple, no JVD. No cervical lymphadenopathy.  Chest:Good air entry bilaterally, no added sounds. Breast /axilla: bilat nml appearance, not dippling noted. No palpable masses/nodules/nipple discharge noted on exam  CVS: S1 S2 regular, no murmurs/gallups or rubs. Abdomen: Bowel sounds active, Non tender and not distended with no gaurding, rigidity or rebound. Pelvic Exam: Cervix normal in appearance, external genitalia normal, no adnexal masses or tenderness, no cervical motion tenderness, rectovaginal septum normal, uterus normal size, shape, and consistency and vagina normal  without discharge   Extremities: B/L Lower Ext shows no edema, both legs are warm to touch Neurology: Awake alert, and oriented X 3, CN II-XII grossly intact, Non focal Skin:No Rash  Data Review No results found for: HGBA1C  Depression screen [facility] 2/9 12/11/2015 11/13/2015 11/09/2014 02/18/2012 10/09/2011  Decreased Interest 0 0 0 0 0  Down, Depressed, Hopeless 0 0 0 0 1  PHQ - 2 Score 0 0 0 0 1      Assessment & Plan   1. Essential hypertension -Uncontrolled, but bit better than 11/13/15. - increase losartan to 100qday, pt to finish her home rx of losartan 50 bid until done, than pick up new rx  - losartan (COZAAR) 100 MG tablet; Take 1 tablet (100 mg total) by mouth daily. For blood pressure  Dispense: 90 tablet; Refill: 3  2. Pap smear for cervical cancer screening - Cytology - PAP  3. Right knee pain - suspect arthritis, might benefit from knee injections, currently uninsured, encouraged to apply for orange card/etc -  dw pt pain contract, which she is amendable to signing - ultram rx - trial vytorin gel as well prn - rice.       Patient have been counseled extensively about nutrition and exercise  Return in about 3 months (around 03/11/2016).  The patient was given clear instructions to go to ER or return to medical center if symptoms don't improve, worsen or new problems develop. The patient verbalized understanding. The patient was told to call to get lab results if they haven't heard anything in the next week.   This note has been created with Education officer, environmental. Any transcriptional errors are unintentional.   Pete Glatter, MD, MBA/MHA [facility] and [facility] [place], [place] [phone]   12/11/2015, 12:42 PM

## 2015-12-12 LAB — CERVICOVAGINAL ANCILLARY ONLY: WET PREP (BD AFFIRM): POSITIVE — AB

## 2015-12-12 LAB — CYTOLOGY - PAP

## 2015-12-12 MED FILL — LOSARTAN POTASSIUM 100 MG T: 100 | 30 days supply | Qty: 30 | Fill #0

## 2015-12-13 ENCOUNTER — Other Ambulatory Visit: Payer: Self-pay | Admitting: Internal Medicine

## 2015-12-13 ENCOUNTER — Telehealth: Payer: Self-pay

## 2015-12-13 MED ORDER — METRONIDAZOLE 500 MG PO TABS: 500.0000 mg | ORAL_TABLET | Freq: Two times a day (BID) | ORAL | 0 refills | Status: DC

## 2015-12-13 MED FILL — metroNIDAZOLE 500 MG TABS: 500 | 7 days supply | Qty: 14 | Fill #0

## 2015-12-13 NOTE — Progress Notes (Signed)
Opened in error

## 2015-12-13 NOTE — Telephone Encounter (Signed)
Contacted pt to go over lab results pt is aware of results and will be coming by to get rx

## 2015-12-14 MED FILL — traMADol HCL 50 MG TABS: 50 | 15 days supply | Qty: 60 | Fill #0

## 2015-12-18 LAB — CERVICOVAGINAL ANCILLARY ONLY: HERPES (WINDOWPATH): NEGATIVE

## 2015-12-28 ENCOUNTER — Ambulatory Visit: Payer: Self-pay | Admitting: Internal Medicine

## 2016-01-08 ENCOUNTER — Encounter: Payer: Self-pay | Admitting: Internal Medicine

## 2016-01-08 ENCOUNTER — Ambulatory Visit: Payer: Self-pay | Attending: Internal Medicine | Admitting: Internal Medicine

## 2016-01-08 VITALS — BP 190/83 | HR 69 | Temp 98.3°F | Resp 16 | Wt 193.0 lb

## 2016-01-08 DIAGNOSIS — M25561 Pain in right knee: Secondary | ICD-10-CM | POA: Insufficient documentation

## 2016-01-08 DIAGNOSIS — F17200 Nicotine dependence, unspecified, uncomplicated: Secondary | ICD-10-CM

## 2016-01-08 DIAGNOSIS — M25552 Pain in left hip: Secondary | ICD-10-CM | POA: Insufficient documentation

## 2016-01-08 DIAGNOSIS — E039 Hypothyroidism, unspecified: Secondary | ICD-10-CM | POA: Insufficient documentation

## 2016-01-08 DIAGNOSIS — E890 Postprocedural hypothyroidism: Secondary | ICD-10-CM

## 2016-01-08 DIAGNOSIS — I10 Essential (primary) hypertension: Secondary | ICD-10-CM | POA: Insufficient documentation

## 2016-01-08 DIAGNOSIS — Z880 Allergy status to penicillin: Secondary | ICD-10-CM | POA: Insufficient documentation

## 2016-01-08 DIAGNOSIS — F17210 Nicotine dependence, cigarettes, uncomplicated: Secondary | ICD-10-CM | POA: Insufficient documentation

## 2016-01-08 MED ORDER — LEVOTHYROXINE SODIUM 75 MCG PO TABS: 75.0000 ug | ORAL_TABLET | Freq: Every day | ORAL | 3 refills | Status: DC

## 2016-01-08 MED ORDER — LOSARTAN POTASSIUM 100 MG PO TABS: 100.0000 mg | ORAL_TABLET | Freq: Every day | ORAL | 3 refills | Status: DC

## 2016-01-08 MED ORDER — AMLODIPINE BESYLATE 10 MG PO TABS: 10.0000 mg | ORAL_TABLET | Freq: Every day | ORAL | 2 refills | Status: DC

## 2016-01-08 MED ORDER — TRAMADOL HCL 50 MG PO TABS: 50.0000 mg | ORAL_TABLET | Freq: Four times a day (QID) | ORAL | 0 refills | Status: DC | PRN

## 2016-01-08 MED FILL — AMLODIPINE BESYLATE 10 MG T: 10 | 30 days supply | Qty: 30 | Fill #0

## 2016-01-08 MED FILL — LEVOTHYROXINE 75 MCG TABLET: 75 | 30 days supply | Qty: 30 | Fill #0

## 2016-01-08 NOTE — Patient Instructions (Signed)

## 2016-01-08 NOTE — Progress Notes (Signed)
Pt is in the office today to follow up on hip pain Pt states she is not in any pain Pt states her hip is not her hurting  Pt she does be having pain in her knee Pt states her right knee is swollen again

## 2016-01-08 NOTE — Progress Notes (Signed)
Ann Jarvis, is a 50 y.o. female  [id number]  [id number]  DOB - 06-12-1965  Chief Complaint  Patient presents with  . Follow-up        Subjective:   Ann Jarvis is a 50 y.o. female here today for a follow up visit for left hip pain and right knee pain. Pain comes and go, needs refill on her ultram.  Notes swelling occasionally right knee.   She also ran out of her synthroid and norvasc recently, and does  Not have $ to pick up rx til Thurs.  She still smokes, 1cig occasionally.   Patient has No headache, No chest pain, No abdominal pain - No Nausea, No new weakness tingling or numbness, No Cough - SOB.  Has appt w/ financial aid coming up for orange card/cone discount.   No problems updated.  ALLERGIES: Allergies  Allergen Reactions  . Penicillins Swelling    PAST MEDICAL HISTORY: Past Medical History:  Diagnosis Date  . Cocaine abuse   . Grave's disease    s/p radioiodine ablation 03/05/10, F/B Dr. Chestine Spore. On Methimazole  . Hypertension   . Hyperthyroidism   . Scabies   . Thyroid condition   . Tobacco abuse     MEDICATIONS AT HOME: Prior to Admission medications   Medication Sig Start Date End Date Taking? Authorizing Provider  amLODipine (NORVASC) 10 MG tablet Take 1 tablet (10 mg total) by mouth daily. Must have office visit for refills 01/08/16   Pete Glatter, MD  cyclobenzaprine (FLEXERIL) 10 MG tablet Take 1 tablet (10 mg total) by mouth 3 (three) times daily as needed for muscle spasms. 11/13/15   Pete Glatter, MD  diclofenac sodium (VOLTAREN) 1 % GEL Apply 2 g topically 4 (four) times daily. 12/11/15   Pete Glatter, MD  fluticasone (FLONASE) 50 MCG/ACT nasal spray Place 2 sprays into both nostrils daily. 11/13/15   Pete Glatter, MD  levothyroxine (SYNTHROID, LEVOTHROID) 75 MCG tablet Take 1 tablet (75 mcg total) by mouth daily. Must have office visit for refills 01/08/16   Pete Glatter, MD  loratadine (CLARITIN) 10 MG  tablet Take 1 tablet (10 mg total) by mouth daily. 11/13/15   Pete Glatter, MD  losartan (COZAAR) 100 MG tablet Take 1 tablet (100 mg total) by mouth daily. For blood pressure 01/08/16   Pete Glatter, MD  methocarbamol (ROBAXIN) 500 MG tablet Take 1 tablet (500 mg total) by mouth 2 (two) times daily. 08/23/15   Tatyana Kirichenko, PA-C  metroNIDAZOLE (FLAGYL) 500 MG tablet Take 1 tablet (500 mg total) by mouth 2 (two) times daily. 12/13/15   Pete Glatter, MD  naproxen (NAPROSYN) 500 MG tablet Take 1 tablet (500 mg total) by mouth 3 (three) times daily as needed. Must have office visit for refills 11/13/15   Pete Glatter, MD  polyethylene glycol powder (GLYCOLAX/MIRALAX) powder Take 17 g by mouth daily. Patient not taking: Reported on 01/08/2016 11/09/14   Ambrose Finland, NP  traMADol (ULTRAM) 50 MG tablet Take 1 tablet (50 mg total) by mouth every 6 (six) hours as needed for severe pain. 01/08/16   Pete Glatter, MD     Objective:   Vitals:   01/08/16 1154  BP: (!) 190/83  Pulse: 69  Resp: 16  Temp: 98.3 F (36.8 C)  TempSrc: Oral  SpO2: 98%  Weight: 193 lb (87.5 kg)    Exam General appearance : Awake, alert, not in any distress. Speech  Clear. Not toxic looking, pleasant HEENT: Atraumatic and Normocephalic, pupils equally reactive to light. Chest:Good air entry bilaterally, no added sounds. CVS: S1 S2 regular, no murmurs/gallups or rubs. Abdomen: Bowel sounds active, Non tender Extremities: B/L Lower Ext shows no edema, both legs are warm to touch. Right knee w/ small amt of effusion/edema, not hot to touch.  Neurology: Awake alert, and oriented X 3, CN II-XII grossly intact, Non focal Skin:No Rash  Data Review No results found for: HGBA1C  [facility] 01/08/2016 12/11/2015 11/13/2015 11/09/2014 02/18/2012  Decreased Interest 0 0 0 0 0  Down, Depressed, Hopeless 0 0 0 0 0  PHQ - 2 Score 0 0 0 0 0      Assessment & Plan   1. Essential  hypertension - renewed norvasc 10, - losartan (COZAAR) 100 MG tablet; Take 1 tablet (100 mg total) by mouth daily. For blood pressure  Dispense: 90 tablet; Refill: 3 - instructed pt to drop by pharmacy to see if can pick up few days supply for free until can pay.  2. Acute pain of right knee Renewed ultram, has flexeril, never picked up voltarin gel Has pain clinic amb ref to pain mgmt once gets orange card Offered aspiration of joint today to r/o inflammation/gout, etc, but pt wants to try pain meds first  3. Hypothyroidism  Ran of rx, no money, asked her to go by pharm to see if can pick up few days supply for now., synthroid renewed.  4. Smokes 1cig/day at times Total cessation again recd  5 hx of bv, pt states took all of flagyl   Patient have been counseled extensively about nutrition and exercise  Return in about 2 weeks (around 01/22/2016), or if symptoms worsen or fail to improve, for right knee pain prn for drainage, or 37mo..  The patient was given clear instructions to go to ER or return to medical center if symptoms don't improve, worsen or new problems develop. The patient verbalized understanding. The patient was told to call to get lab results if they haven't heard anything in the next week.   This note has been created with Education officer, environmentalDragon speech recognition software and smart phrase technology. Any transcriptional errors are unintentional.   Pete Glatterawn T Langeland, MD, MBA/MHA [facility] [place], [place] [phone]   01/08/2016, 12:28 PM

## 2016-01-10 MED FILL — traMADol HCL 50 MG TABS: 50 | 15 days supply | Qty: 60 | Fill #0

## 2016-01-11 ENCOUNTER — Ambulatory Visit: Payer: Self-pay | Attending: Internal Medicine

## 2016-01-29 ENCOUNTER — Ambulatory Visit: Payer: Self-pay | Attending: Internal Medicine | Admitting: Internal Medicine

## 2016-01-29 VITALS — BP 184/83 | HR 66 | Temp 97.6°F | Wt 194.8 lb

## 2016-01-29 DIAGNOSIS — Z880 Allergy status to penicillin: Secondary | ICD-10-CM | POA: Insufficient documentation

## 2016-01-29 DIAGNOSIS — M25561 Pain in right knee: Secondary | ICD-10-CM | POA: Insufficient documentation

## 2016-01-29 DIAGNOSIS — F1410 Cocaine abuse, uncomplicated: Secondary | ICD-10-CM | POA: Insufficient documentation

## 2016-01-29 DIAGNOSIS — E0500 Thyrotoxicosis with diffuse goiter without thyrotoxic crisis or storm: Secondary | ICD-10-CM | POA: Insufficient documentation

## 2016-01-29 DIAGNOSIS — I10 Essential (primary) hypertension: Secondary | ICD-10-CM | POA: Insufficient documentation

## 2016-01-29 DIAGNOSIS — B86 Scabies: Secondary | ICD-10-CM | POA: Insufficient documentation

## 2016-01-29 MED ORDER — LOSARTAN POTASSIUM 100 MG PO TABS: 100.0000 mg | ORAL_TABLET | Freq: Every day | ORAL | 3 refills | Status: DC

## 2016-01-29 MED ORDER — ACETAMINOPHEN-CODEINE #3 300-30 MG PO TABS: 1.0000 | ORAL_TABLET | Freq: Four times a day (QID) | ORAL | 0 refills | Status: DC | PRN

## 2016-01-29 MED FILL — LOSARTAN POTASSIUM 100 MG T: 100 | 30 days supply | Qty: 30 | Fill #0

## 2016-01-29 NOTE — Patient Instructions (Signed)

## 2016-01-29 NOTE — Progress Notes (Signed)
Ann Jarvis, is a 50 y.o. female  QMV:[id number]:[id number]  MWU:[id number]  DOB - 04/03/1965  Chief Complaint  Patient presents with  . Follow-up        Subjective:   Ann Jarvis is a 50 y.o. female here today for a follow up visit for htn and right knee pain. Pt states the knee pain has been bad, w/ associated swelling. However, she says today the swelling has gone down considerably. She was interested in knee aspiration if able today, requesting change in meds as well since the ultram is not helping.  Did not try flexeril.  Pt has also ran out of her cosaar, looks like she never picked up the rx I gave her on 01/08/16.  She has been taking all her other meds thought.   Patient has No headache, No chest pain, No abdominal pain - No Nausea, No new weakness tingling or numbness, No Cough - SOB.  No problems updated.  ALLERGIES: Allergies  Allergen Reactions  . Penicillins Swelling    PAST MEDICAL HISTORY: Past Medical History:  Diagnosis Date  . Cocaine abuse   . Grave's disease    s/p radioiodine ablation 03/05/10, F/B Dr. Chestine Sporelark. On Methimazole  . Hypertension   . Hyperthyroidism   . Scabies   . Thyroid condition   . Tobacco abuse     MEDICATIONS AT HOME: Prior to Admission medications   Medication Sig Start Date End Date Taking? Authorizing Provider  amLODipine (NORVASC) 10 MG tablet Take 1 tablet (10 mg total) by mouth daily. Must have office visit for refills 01/08/16  Yes Pete Glatterawn T Reis Pienta, MD  cyclobenzaprine (FLEXERIL) 10 MG tablet Take 1 tablet (10 mg total) by mouth 3 (three) times daily as needed for muscle spasms. 11/13/15  Yes Pete Glatterawn T Nancyann Cotterman, MD  losartan (COZAAR) 100 MG tablet Take 1 tablet (100 mg total) by mouth daily. For blood pressure 01/29/16  Yes Pete Glatterawn T Doral Ventrella, MD  traMADol (ULTRAM) 50 MG tablet Take 1 tablet (50 mg total) by mouth every 6 (six) hours as needed for severe pain. 01/08/16  Yes Prescious Hurless Marland Mcalpine Noella Kipnis, MD  acetaminophen-codeine (TYLENOL  #3) 300-30 MG tablet Take 1 tablet by mouth every 6 (six) hours as needed for moderate pain. 01/29/16   Pete Glatterawn T Jonie Burdell, MD  diclofenac sodium (VOLTAREN) 1 % GEL Apply 2 g topically 4 (four) times daily. 12/11/15   Pete Glatterawn T Tacha Manni, MD  fluticasone (FLONASE) 50 MCG/ACT nasal spray Place 2 sprays into both nostrils daily. Patient not taking: Reported on 01/29/2016 11/13/15   Pete Glatterawn T Chaya Dehaan, MD  levothyroxine (SYNTHROID, LEVOTHROID) 75 MCG tablet Take 1 tablet (75 mcg total) by mouth daily. Must have office visit for refills 01/08/16   Pete Glatterawn T Lawanda Holzheimer, MD  loratadine (CLARITIN) 10 MG tablet Take 1 tablet (10 mg total) by mouth daily. 11/13/15   Pete Glatterawn T Laquinton Bihm, MD  methocarbamol (ROBAXIN) 500 MG tablet Take 1 tablet (500 mg total) by mouth 2 (two) times daily. 08/23/15   Tatyana Kirichenko, PA-C  metroNIDAZOLE (FLAGYL) 500 MG tablet Take 1 tablet (500 mg total) by mouth 2 (two) times daily. Patient not taking: Reported on 01/29/2016 12/13/15   Pete Glatterawn T Emmalene Kattner, MD  naproxen (NAPROSYN) 500 MG tablet Take 1 tablet (500 mg total) by mouth 3 (three) times daily as needed. Must have office visit for refills Patient not taking: Reported on 01/29/2016 11/13/15   Pete Glatterawn T Bradleigh Sonnen, MD  polyethylene glycol powder (GLYCOLAX/MIRALAX) powder Take 17 g by mouth daily. Patient not  taking: Reported on 01/29/2016 11/09/14   Ambrose FinlandValerie A Keck, NP     Objective:   Vitals:   01/29/16 1404  BP: (!) 184/83  Pulse: 66  Temp: 97.6 F (36.4 C)  TempSrc: Oral  SpO2: 97%  Weight: 194 lb 12.8 oz (88.4 kg)    Exam General appearance : Awake, alert, not in any distress. Speech Clear. Not toxic looking, pleasant HEENT: Atraumatic and Normocephalic, pupils equally reactive to light. Neck: supple, no JVD. No cervical lymphadenopathy.  Chest:Good air entry bilaterally, no added sounds. CVS: S1 S2 regular, no murmurs/gallups or rubs. Abdomen: Bowel sounds active, Non tender and not distended with no gaurding, rigidity or  rebound. Right knee, mild ttp lateral menicus. No obvious effusions noted for aspiration.  Not warm to touch, rom intact. Extremities: B/L Lower Ext shows no edema, both legs are warm to touch Neurology: Awake alert, and oriented X 3, CN II-XII grossly intact, Non focal Skin:No Rash  Data Review No results found for: HGBA1C  Dep[facility] screen [facility] 01/29/2016 01/08/2016 12/11/2015 11/13/2015 11/09/2014  Decreased Interest 0 0 0 0 0  Down, Depressed, Hopeless 0 0 0 0 0  PHQ - 2 Score 0 0 0 0 0  Altered sleeping 1 - - - -  Tired, decreased energy 1 - - - -  Change in appetite 3 - - - -  Feeling bad or failure about yourself  0 - - - -  Trouble concentrating 0 - - - -  Moving slowly or fidgety/restless 0 - - - -  Suicidal thoughts 0 - - - -  PHQ-9 Score 5 - - - -      Assessment & Plan   1. Essential hypertension Not taking the cozaar after I went through all her pill bottles, asked her to pick up rx. - losartan (COZAAR) 100 MG tablet; Take 1 tablet (100 mg total) by mouth daily. For blood pressure  Dispense: 90 tablet; Refill: 3 - continue norvasc 10 daily  2. Acute pain of right knee notting to aspiration, doubt septic joint. - Ambulatory referral to Pain Clinic - may benefit w/ steroid injection. - DG Knee 1-2 Views Right; Future - changed ultram to tylenol #3 - flexeril prn.    Patient have been counseled extensively about nutrition and exercise  Return in about 4 weeks (around 02/26/2016) for knee pain/ htn.  The patient was given clear instructions to go to ER or return to medical center if symptoms don't improve, worsen or new problems develop. The patient verbalized understanding. The patient was told to call to get lab results if they haven't heard anything in the next week.   This note has been created with Education officer, environmentalDragon speech recognition software and smart phrase technology. Any transcriptional errors are unintentional.   Pete Glatterawn T Valen Gillison, MD, MBA/MHA [facility] [place], [place] [phone]   01/29/2016, 4:38 PM

## 2016-01-29 NOTE — Progress Notes (Signed)
Pt is in the office today to follow up on right knee pain Pt states there is edema  Pt has swelling in her right knee Pt needs refill on losartan, flagyl

## 2016-02-01 MED FILL — ACETAMINOPHEN/COD #3 TABLET: 300-30 | 11 days supply | Qty: 45 | Fill #0

## 2016-02-08 MED FILL — LOSARTAN POTASSIUM 100 MG T: 100 | 30 days supply | Qty: 30 | Fill #1

## 2016-02-20 ENCOUNTER — Ambulatory Visit (HOSPITAL_COMMUNITY)
Admission: RE | Admit: 2016-02-20 | Discharge: 2016-02-20 | Disposition: A | Discharge status: Home / Self Care | Payer: Self-pay | Source: Ambulatory Visit | Attending: Internal Medicine | Admitting: Internal Medicine

## 2016-02-20 DIAGNOSIS — M25561 Pain in right knee: Secondary | ICD-10-CM

## 2016-02-20 DIAGNOSIS — M25461 Effusion, right knee: Secondary | ICD-10-CM | POA: Insufficient documentation

## 2016-02-27 ENCOUNTER — Other Ambulatory Visit: Payer: Self-pay | Admitting: Internal Medicine

## 2016-03-01 ENCOUNTER — Telehealth: Payer: Self-pay

## 2016-03-01 NOTE — Telephone Encounter (Signed)
Contacted pt to go over xray results pt is aware of results pt states she will do either the knee aspiration or the steroid injection pt states she has an appointment schedule on the 6th of December so she would like to do it then

## 2016-03-04 ENCOUNTER — Emergency Department (HOSPITAL_COMMUNITY)
Admission: EM | Admit: 2016-03-04 | Discharge: 2016-03-04 | Disposition: A | Discharge status: Home / Self Care | Payer: Self-pay | Attending: Emergency Medicine | Admitting: Emergency Medicine

## 2016-03-04 ENCOUNTER — Encounter (HOSPITAL_COMMUNITY): Payer: Self-pay

## 2016-03-04 DIAGNOSIS — L509 Urticaria, unspecified: Secondary | ICD-10-CM | POA: Insufficient documentation

## 2016-03-04 DIAGNOSIS — Z79899 Other long term (current) drug therapy: Secondary | ICD-10-CM | POA: Insufficient documentation

## 2016-03-04 DIAGNOSIS — F17210 Nicotine dependence, cigarettes, uncomplicated: Secondary | ICD-10-CM | POA: Insufficient documentation

## 2016-03-04 DIAGNOSIS — I10 Essential (primary) hypertension: Secondary | ICD-10-CM | POA: Insufficient documentation

## 2016-03-04 MED ORDER — DIPHENHYDRAMINE HCL 25 MG PO CAPS
25.0000 mg | ORAL_CAPSULE | Freq: Once | ORAL | Status: AC
  Administered 2016-03-04: 25 mg via ORAL
  Filled 2016-03-04: qty 1

## 2016-03-04 MED ORDER — DIPHENHYDRAMINE HCL 25 MG PO CAPS: 25.0000 mg | ORAL_CAPSULE | Freq: Four times a day (QID) | ORAL | 0 refills | Status: DC | PRN

## 2016-03-04 MED ORDER — FAMOTIDINE 20 MG PO TABS: 20.0000 mg | ORAL_TABLET | Freq: Two times a day (BID) | ORAL | 0 refills | Status: DC

## 2016-03-04 MED ORDER — PREDNISONE 20 MG PO TABS
60.0000 mg | ORAL_TABLET | Freq: Once | ORAL | Status: AC
  Administered 2016-03-04: 60 mg via ORAL
  Filled 2016-03-04: qty 3

## 2016-03-04 MED ORDER — EPINEPHRINE 0.3 MG/0.3ML IJ SOAJ: 0.3000 mg | Freq: Once | INTRAMUSCULAR | 0 refills | Status: AC

## 2016-03-04 MED ORDER — FAMOTIDINE 20 MG PO TABS
20.0000 mg | ORAL_TABLET | Freq: Once | ORAL | Status: AC
  Administered 2016-03-04: 20 mg via ORAL
  Filled 2016-03-04: qty 1

## 2016-03-04 MED ORDER — PREDNISONE 20 MG PO TABS: 40.0000 mg | ORAL_TABLET | Freq: Every day | ORAL | 0 refills | Status: DC

## 2016-03-04 NOTE — Discharge Instructions (Signed)
Take prednisone over the next 4 days.  Take Benadryl every 6 hours as needed for itching.  Take pepcid twice daily for the next 5 days.  Return to the ED for difficulty breathing, worsening symptoms, facial swelling, or any new or concerning symptoms.  Stop eating seafood or shellfish as this is the likely cause of your symptoms.

## 2016-03-04 NOTE — ED Triage Notes (Signed)
Pt. Ate seafood on Saturday and began having hives and itching she has hives all over her torso and arms and legs.   Airway is intact no swelling noted.  Speech is clear.  She ate seafood before and this happened  Before and her lips swelled.  No acute distress noted

## 2016-03-04 NOTE — ED Provider Notes (Signed)
[facility]-EMERGENCY DEPT Provider Note   CSN: [id number] Arrival date & time: 03/04/16  1121     History   Chief Complaint Chief Complaint  Patient presents with  . Allergic Reaction    HPI Ann Jarvis is a 50 y.o. female.  HPI Ann Jarvis is a 50 y.o. female with PMH significant for Grave's disease, HTN, Hyperthyroidism who presents with gradual onset, constant, unchanging hives that began 3 days ago.  Patient states she ate seafood Friday evening and shortly after began experiencing hives and generalized itching.  She states this has happened a couple of times before when she eats seafood.  No new soaps, lotions, detergents, or medications.  She states last night she felt like her throat was closing up, but this has resolved.  No wheezing, dyspnea, CP, syncope, abdominal pain, N/V/D.  She took a benadryl last night with minimal relief.   Past Medical History:  Diagnosis Date  . Cocaine abuse   . Grave's disease    s/p radioiodine ablation 03/05/10, F/B Dr. Chestine Sporelark. On Methimazole  . Hypertension   . Hyperthyroidism   . Scabies   . Thyroid condition   . Tobacco abuse     Patient Active Problem List   Diagnosis Date Noted  . Hypothyroidism 11/13/2015  . Bell's palsy 10/09/2011  . DENTAL PAIN 08/01/2009  . PRURITUS 03/07/2009  . TOBACCO ABUSE 10/10/2008  . COCAINE ABUSE 08/17/2007  . Essential hypertension, benign 08/17/2007  . ABSCESS, TOOTH 08/17/2007  . CHEST PAIN UNSPECIFIED 08/17/2007  . SHOULDER PAIN, LEFT 07/27/2007  . GRAVE'S DISEASE 12/18/2006    Past Surgical History:  Procedure Laterality Date  . BTL    . MOUTH SURGERY      OB History    No data available       Home Medications    Prior to Admission medications   Medication Sig Start Date End Date Taking? Authorizing Provider  acetaminophen-codeine (TYLENOL #3) 300-30 MG tablet Take 1 tablet by mouth every 6 (six) hours as needed for moderate pain. 01/29/16   Pete Glatterawn T Langeland, MD    amLODipine (NORVASC) 10 MG tablet Take 1 tablet (10 mg total) by mouth daily. Must have office visit for refills 01/08/16   Pete Glatterawn T Langeland, MD  cyclobenzaprine (FLEXERIL) 10 MG tablet Take 1 tablet (10 mg total) by mouth 3 (three) times daily as needed for muscle spasms. 11/13/15   Pete Glatterawn T Langeland, MD  diclofenac sodium (VOLTAREN) 1 % GEL Apply 2 g topically 4 (four) times daily. 12/11/15   Pete Glatterawn T Langeland, MD  diphenhydrAMINE (BENADRYL) 25 mg capsule Take 1 capsule (25 mg total) by mouth every 6 (six) hours as needed. 03/04/16   Cheri FowlerKayla Jamariyah Johannsen, PA-C  EPINEPHrine 0.3 mg/0.3 mL IJ SOAJ injection Inject 0.3 mLs (0.3 mg total) into the muscle once. 03/04/16 03/04/16  Cheri FowlerKayla Tosh Glaze, PA-C  famotidine (PEPCID) 20 MG tablet Take 1 tablet (20 mg total) by mouth 2 (two) times daily. 03/04/16   Cordel Drewes, PA-C  fluticasone (FLONASE) 50 MCG/ACT nasal spray Place 2 sprays into both nostrils daily. Patient not taking: Reported on 01/29/2016 11/13/15   Pete Glatterawn T Langeland, MD  levothyroxine (SYNTHROID, LEVOTHROID) 75 MCG tablet Take 1 tablet (75 mcg total) by mouth daily. Must have office visit for refills 01/08/16   Pete Glatterawn T Langeland, MD  loratadine (CLARITIN) 10 MG tablet Take 1 tablet (10 mg total) by mouth daily. 11/13/15   Pete Glatterawn T Langeland, MD  losartan (COZAAR) 100 MG tablet Take 1 tablet (100  mg total) by mouth daily. For blood pressure 01/29/16   Pete Glatterawn T Langeland, MD  methocarbamol (ROBAXIN) 500 MG tablet Take 1 tablet (500 mg total) by mouth 2 (two) times daily. 08/23/15   Tatyana Kirichenko, PA-C  metroNIDAZOLE (FLAGYL) 500 MG tablet Take 1 tablet (500 mg total) by mouth 2 (two) times daily. Patient not taking: Reported on 01/29/2016 12/13/15   Pete Glatterawn T Langeland, MD  naproxen (NAPROSYN) 500 MG tablet Take 1 tablet (500 mg total) by mouth 3 (three) times daily as needed. Must have office visit for refills Patient not taking: Reported on 01/29/2016 11/13/15   Pete Glatterawn T Langeland, MD  polyethylene glycol powder  (GLYCOLAX/MIRALAX) powder Take 17 g by mouth daily. Patient not taking: Reported on 01/29/2016 11/09/14   Ambrose FinlandValerie A Keck, NP  predniSONE (DELTASONE) 20 MG tablet Take 2 tablets (40 mg total) by mouth daily. 03/04/16   Cheri FowlerKayla Lety Cullens, PA-C  traMADol (ULTRAM) 50 MG tablet Take 1 tablet (50 mg total) by mouth every 6 (six) hours as needed for severe pain. 01/08/16   Pete Glatterawn T Langeland, MD    Family History No family history on file.  Social History Social History  Substance Use Topics  . Smoking status: Current Every Day Smoker    Packs/day: 0.25    Types: Cigarettes  . Smokeless tobacco: Never Used     Comment: 1 cig a day  . Alcohol use 0.6 - 1.2 oz/week    1 - 2 Cans of beer per week     Comment: occ     Allergies   Penicillins   Review of Systems Review of Systems All other systems negative unless otherwise stated in HPI   Physical Exam Updated Vital Signs BP (!) 178/104 (BP Location: Right Arm)   Pulse 68   Temp 97.7 F (36.5 C) (Oral)   Resp 18   Ht 5\' 6"  (1.676 m)   Wt 87.5 kg   LMP 10/07/2015   SpO2 97%   BMI 31.15 kg/m   Physical Exam  Constitutional: She is oriented to person, place, and time. She appears well-developed and well-nourished.  Non-toxic appearance. She does not have a sickly appearance. She does not appear ill.  HENT:  Head: Normocephalic and atraumatic.  Mouth/Throat: Oropharynx is clear and moist.  No OP, tongue, lip, or facial edema.  Normal phonation.   Eyes: Conjunctivae are normal.  Neck: Normal range of motion. Neck supple.  Cardiovascular: Normal rate and regular rhythm.   Pulmonary/Chest: Effort normal and breath sounds normal. No accessory muscle usage or stridor. No respiratory distress. She has no wheezes. She has no rhonchi. She has no rales.  Abdominal: Soft. Bowel sounds are normal. She exhibits no distension. There is no tenderness.  Musculoskeletal: Normal range of motion.  Lymphadenopathy:    She has no cervical adenopathy.    Neurological: She is alert and oriented to person, place, and time.  Speech clear without dysarthria.  Skin: Skin is warm and dry. Rash noted.  Hives to lower back, buttocks, and proximal thighs without signs of overlying infection.   Psychiatric: She has a normal mood and affect. Her behavior is normal.     ED Treatments / Results  Labs (all labs ordered are listed, but only abnormal results are displayed) Labs Reviewed - No data to display  EKG  EKG Interpretation None       Radiology No results found.  Procedures Procedures (including critical care time)  Medications Ordered in ED Medications  predniSONE (DELTASONE)  tablet 60 mg (60 mg Oral Given 03/04/16 1353)  diphenhydrAMINE (BENADRYL) capsule 25 mg (25 mg Oral Given 03/04/16 1353)  famotidine (PEPCID) tablet 20 mg (20 mg Oral Given 03/04/16 1353)     Initial Impression / Assessment and Plan / ED Course  I have reviewed the triage vital signs and the nursing notes.  Pertinent labs & imaging results that were available during my care of the patient were reviewed by me and considered in my medical decision making (see chart for details).  Clinical Course     Patient with urticarial eruption. Likely triggered by seafood/shellfish. Discussed avoiding these foods.  No signs of anaphylactic reaction; no new medications. Will treat with prednisone, benadryl, and pepcid.  Patient refused Decadron in ED. Follow up with PCP in 2-3 days. Return precautions discussed. Pt is safe for discharge at this time.   Final Clinical Impressions(s) / ED Diagnoses   Final diagnoses:  Urticaria    New Prescriptions New Prescriptions   DIPHENHYDRAMINE (BENADRYL) 25 MG CAPSULE    Take 1 capsule (25 mg total) by mouth every 6 (six) hours as needed.   EPINEPHRINE 0.3 MG/0.3 ML IJ SOAJ INJECTION    Inject 0.3 mLs (0.3 mg total) into the muscle once.   FAMOTIDINE (PEPCID) 20 MG TABLET    Take 1 tablet (20 mg total) by mouth 2 (two) times  daily.   PREDNISONE (DELTASONE) 20 MG TABLET    Take 2 tablets (40 mg total) by mouth daily.     Cheri Fowler, PA-C 03/04/16 1411    Pricilla Loveless, MD 03/05/16 [phone]

## 2016-03-05 MED FILL — predniSONE 20 MG TABS: 20 | 4 days supply | Qty: 8 | Fill #0

## 2016-03-06 ENCOUNTER — Encounter: Payer: Self-pay | Admitting: Internal Medicine

## 2016-03-06 ENCOUNTER — Ambulatory Visit: Payer: Self-pay | Attending: Internal Medicine | Admitting: Internal Medicine

## 2016-03-06 VITALS — BP 219/106 | HR 67 | Temp 97.8°F | Resp 16 | Wt 197.2 lb

## 2016-03-06 DIAGNOSIS — G8929 Other chronic pain: Secondary | ICD-10-CM

## 2016-03-06 DIAGNOSIS — Z9111 Patient's noncompliance with dietary regimen: Secondary | ICD-10-CM | POA: Insufficient documentation

## 2016-03-06 DIAGNOSIS — Z91119 Patient's noncompliance with dietary regimen due to unspecified reason: Secondary | ICD-10-CM

## 2016-03-06 DIAGNOSIS — M25461 Effusion, right knee: Secondary | ICD-10-CM | POA: Insufficient documentation

## 2016-03-06 DIAGNOSIS — I10 Essential (primary) hypertension: Secondary | ICD-10-CM | POA: Insufficient documentation

## 2016-03-06 DIAGNOSIS — Z1239 Encounter for other screening for malignant neoplasm of breast: Secondary | ICD-10-CM

## 2016-03-06 DIAGNOSIS — F1410 Cocaine abuse, uncomplicated: Secondary | ICD-10-CM | POA: Insufficient documentation

## 2016-03-06 DIAGNOSIS — Z1231 Encounter for screening mammogram for malignant neoplasm of breast: Secondary | ICD-10-CM

## 2016-03-06 DIAGNOSIS — G4700 Insomnia, unspecified: Secondary | ICD-10-CM

## 2016-03-06 DIAGNOSIS — M25561 Pain in right knee: Secondary | ICD-10-CM | POA: Insufficient documentation

## 2016-03-06 DIAGNOSIS — E890 Postprocedural hypothyroidism: Secondary | ICD-10-CM | POA: Insufficient documentation

## 2016-03-06 DIAGNOSIS — M5441 Lumbago with sciatica, right side: Secondary | ICD-10-CM | POA: Insufficient documentation

## 2016-03-06 LAB — TSH: TSH: 15.28 mIU/L — ABNORMAL HIGH

## 2016-03-06 LAB — T3, FREE: T3, Free: 1.8 pg/mL — ABNORMAL LOW (ref 2.3–4.2)

## 2016-03-06 LAB — T4, FREE: Free T4: 0.8 ng/dL (ref 0.8–1.8)

## 2016-03-06 MED ORDER — LOSARTAN POTASSIUM-HCTZ 100-25 MG PO TABS: 1.0000 | ORAL_TABLET | Freq: Every day | ORAL | 3 refills | Status: DC

## 2016-03-06 MED ORDER — CYCLOBENZAPRINE HCL 10 MG PO TABS: 10.0000 mg | ORAL_TABLET | Freq: Three times a day (TID) | ORAL | 1 refills | Status: DC | PRN

## 2016-03-06 MED ORDER — DICLOFENAC SODIUM 1 % TD GEL
2.0000 g | Freq: Four times a day (QID) | TRANSDERMAL | 2 refills | Status: DC
Start: 2016-03-06 — End: 2016-05-07

## 2016-03-06 MED ORDER — MELATONIN 5 MG PO TABS: 5.0000 mg | ORAL_TABLET | Freq: Every day | ORAL | 0 refills | Status: DC

## 2016-03-06 MED ORDER — TRAMADOL HCL 50 MG PO TABS: 50.0000 mg | ORAL_TABLET | Freq: Four times a day (QID) | ORAL | 0 refills | Status: DC | PRN

## 2016-03-06 MED ORDER — AMLODIPINE BESYLATE 10 MG PO TABS: 10.0000 mg | ORAL_TABLET | Freq: Every day | ORAL | 2 refills | Status: DC

## 2016-03-06 MED FILL — LOSARTAN-HCTZ 100-25 MG TAB: 100-25 | 30 days supply | Qty: 30 | Fill #0

## 2016-03-06 MED FILL — CYCLOBENZAPRINE 10 MG TAB: 10 | 20 days supply | Qty: 60 | Fill #0

## 2016-03-06 MED FILL — AMLODIPINE BESYLATE 10 MG T: 10 | 30 days supply | Qty: 30 | Fill #0

## 2016-03-06 NOTE — Patient Instructions (Signed)
Insomnia Insomnia is a sleep disorder that makes it difficult to fall asleep or to stay asleep. Insomnia can cause tiredness (fatigue), low energy, difficulty concentrating, mood swings, and poor performance at work or school. There are three different ways to classify insomnia:  Difficulty falling asleep.  Difficulty staying asleep.  Waking up too early in the morning. Any type of insomnia can be long-term (chronic) or short-term (acute). Both are common. Short-term insomnia usually lasts for three months or less. Chronic insomnia occurs at least three times a week for longer than three months. What are the causes? Insomnia may be caused by another condition, situation, or substance, such as:  Anxiety.  Certain medicines.  Gastroesophageal reflux disease (GERD) or other gastrointestinal conditions.  Asthma or other breathing conditions.  Restless legs syndrome, sleep apnea, or other sleep disorders.  Chronic pain.  Menopause. This may include hot flashes.  Stroke.  Abuse of alcohol, tobacco, or illegal drugs.  Depression.  Caffeine.  Neurological disorders, such as Alzheimer disease.  An overactive thyroid (hyperthyroidism). The cause of insomnia may not be known. What increases the risk? Risk factors for insomnia include:  Gender. Women are more commonly affected than men.  Age. Insomnia is more common as you get older.  Stress. This may involve your professional or personal life.  Income. Insomnia is more common in people with lower income.  Lack of exercise.  Irregular work schedule or night shifts.  Traveling between different time zones. What are the signs or symptoms? If you have insomnia, trouble falling asleep or trouble staying asleep is the main symptom. This may lead to other symptoms, such as:  Feeling fatigued.  Feeling nervous about going to sleep.  Not feeling rested in the morning.  Having trouble concentrating.  Feeling irritable,  anxious, or depressed. How is this treated? Treatment for insomnia depends on the cause. If your insomnia is caused by an underlying condition, treatment will focus on addressing the condition. Treatment may also include:  Medicines to help you sleep.  Counseling or therapy.  Lifestyle adjustments. Follow these instructions at home:  Take medicines only as directed by your health care provider.  Keep regular sleeping and waking hours. Avoid naps.  Keep a sleep diary to help you and your health care provider figure out what could be causing your insomnia. Include:  When you sleep.  When you wake up during the night.  How well you sleep.  How rested you feel the next day.  Any side effects of medicines you are taking.  What you eat and drink.  Make your bedroom a comfortable place where it is easy to fall asleep:  Put up shades or special blackout curtains to block light from outside.  Use a white noise machine to block noise.  Keep the temperature cool.  Exercise regularly as directed by your health care provider. Avoid exercising right before bedtime.  Use relaxation techniques to manage stress. Ask your health care provider to suggest some techniques that may work well for you. These may include:  Breathing exercises.  Routines to release muscle tension.  Visualizing peaceful scenes.  Cut back on alcohol, caffeinated beverages, and cigarettes, especially close to bedtime. These can disrupt your sleep.  Do not overeat or eat spicy foods right before bedtime. This can lead to digestive discomfort that can make it hard for you to sleep.  Limit screen use before bedtime. This includes:  Watching TV.  Using your smartphone, tablet, and computer.  Stick to a   routine. This can help you fall asleep faster. Try to do a quiet activity, brush your teeth, and go to bed at the same time each night.  Get out of bed if you are still awake after 15 minutes of trying to  sleep. Keep the lights down, but try reading or doing a quiet activity. When you feel sleepy, go back to bed.  Make sure that you drive carefully. Avoid driving if you feel very sleepy.  Keep all follow-up appointments as directed by your health care provider. This is important. Contact a health care provider if:  You are tired throughout the day or have trouble in your daily routine due to sleepiness.  You continue to have sleep problems or your sleep problems get worse. Get help right away if:  You have serious thoughts about hurting yourself or someone else. This information is not intended to replace advice given to you by your health care provider. Make sure you discuss any questions you have with your health care provider. Document Released: 03/15/2000 Document Revised: 08/18/2015 Document Reviewed: 12/17/2013 Elsevier Interactive Patient Education  2017 Elsevier Inc.   -  Low-Sodium Eating Plan Sodium raises blood pressure and causes water to be held in the body. Getting less sodium from food will help lower your blood pressure, reduce any swelling, and protect your heart, liver, and kidneys. We get sodium by adding salt (sodium chloride) to food. Most of our sodium comes from canned, boxed, and frozen foods. Restaurant foods, fast foods, and pizza are also very high in sodium. Even if you take medicine to lower your blood pressure or to reduce fluid in your body, getting less sodium from your food is important. What is my plan? Most people should limit their sodium intake to 2,300 mg a day. Your health care provider recommends that you limit your sodium intake to __________ a day.to 2000mg  day. What do I need to know about this eating plan? For the low-sodium eating plan, you will follow these general guidelines:  Choose foods with a % Daily Value for sodium of less than 5% (as listed on the food label).  Use salt-free seasonings or herbs instead of table salt or sea  salt.  Check with your health care provider or pharmacist before using salt substitutes.  Eat fresh foods.  Eat more vegetables and fruits.  Limit canned vegetables. If you do use them, rinse them well to decrease the sodium.  Limit cheese to 1 oz (28 g) per day.  Eat lower-sodium products, often labeled as "lower sodium" or "no salt added."  Avoid foods that contain monosodium glutamate (MSG). MSG is sometimes added to [place] food and some canned foods.  Check food labels (Nutrition Facts labels) on foods to learn how much sodium is in one serving.  Eat more home-cooked food and less restaurant, buffet, and fast food.  When eating at a restaurant, ask that your food be prepared with less salt, or no salt if possible. How do I read food labels for sodium information? The Nutrition Facts label lists the amount of sodium in one serving of the food. If you eat more than one serving, you must multiply the listed amount of sodium by the number of servings. Food labels may also identify foods as:  Sodium free-Less than 5 mg in a serving.  Very low sodium-35 mg or less in a serving.  Low sodium-140 mg or less in a serving.  Light in sodium-50% less sodium in a serving. For example,  if a food that usually has 300 mg of sodium is changed to become light in sodium, it will have 150 mg of sodium.  Reduced sodium-25% less sodium in a serving. For example, if a food that usually has 400 mg of sodium is changed to reduced sodium, it will have 300 mg of sodium. What foods can I eat? Grains  Low-sodium cereals, including oats, puffed wheat and rice, and shredded wheat cereals. Low-sodium crackers. Unsalted rice and pasta. Lower-sodium bread. Vegetables  Frozen or fresh vegetables. Low-sodium or reduced-sodium canned vegetables. Low-sodium or reduced-sodium tomato sauce and paste. Low-sodium or reduced-sodium tomato and vegetable juices. Fruits  Fresh, frozen, and canned fruit. Fruit  juice. Meat and Other Protein Products  Low-sodium canned tuna and salmon. Fresh or frozen meat, poultry, seafood, and fish. Lamb. Unsalted nuts. Dried beans, peas, and lentils without added salt. Unsalted canned beans. Homemade soups without salt. Eggs. Dairy  Milk. Soy milk. Ricotta cheese. Low-sodium or reduced-sodium cheeses. Yogurt. Condiments  Fresh and dried herbs and spices. Salt-free seasonings. Onion and garlic powders. Low-sodium varieties of mustard and ketchup. Fresh or refrigerated horseradish. Lemon juice. Fats and Oils  Reduced-sodium salad dressings. Unsalted butter. Other  Unsalted popcorn and pretzels. The items listed above may not be a complete list of recommended foods or beverages. Contact your dietitian for more options.  What foods are not recommended? Grains  Instant hot cereals. Bread stuffing, pancake, and biscuit mixes. Croutons. Seasoned rice or pasta mixes. Noodle soup cups. Boxed or frozen macaroni and cheese. Self-rising flour. Regular salted crackers. Vegetables  Regular canned vegetables. Regular canned tomato sauce and paste. Regular tomato and vegetable juices. Frozen vegetables in sauces. Salted [place] fries. Olives. Rosita FirePickles. Relishes. Sauerkraut. Salsa. Meat and Other Protein Products  Salted, canned, smoked, spiced, or pickled meats, seafood, or fish. Bacon, ham, sausage, hot dogs, corned beef, chipped beef, and packaged luncheon meats. Salt pork. Jerky. Pickled herring. Anchovies, regular canned tuna, and sardines. Salted nuts. Dairy  Processed cheese and cheese spreads. Cheese curds. Blue cheese and cottage cheese. Buttermilk. Condiments  Onion and garlic salt, seasoned salt, table salt, and sea salt. Canned and packaged gravies. Worcestershire sauce. Tartar sauce. Barbecue sauce. Teriyaki sauce. Soy sauce, including reduced sodium. Steak sauce. Fish sauce. Oyster sauce. Cocktail sauce. Horseradish that you find on the shelf. Regular ketchup and  mustard. Meat flavorings and tenderizers. Bouillon cubes. Hot sauce. Tabasco sauce. Marinades. Taco seasonings. Relishes. Fats and Oils  Regular salad dressings. Salted butter. Margarine. Ghee. Bacon fat. Other  Potato and tortilla chips. Corn chips and puffs. Salted popcorn and pretzels. Canned or dried soups. Pizza. Frozen entrees and pot pies. The items listed above may not be a complete list of foods and beverages to avoid. Contact your dietitian for more information.  This information is not intended to replace advice given to you by your health care provider. Make sure you discuss any questions you have with your health care provider. Document Released: 09/07/2001 Document Revised: 08/24/2015 Document Reviewed: 01/20/2013 Elsevier Interactive Patient Education  2017 [employer]

## 2016-03-06 NOTE — Progress Notes (Addendum)
Ann Jarvis, is a 50 y.o. female  [id number]  [id number]  DOB - 12/08/1965  Chief Complaint  Patient presents with  . Knee Pain        Subjective:   Ann Jarvis is a 50 y.o. female here today for a follow up visit, last seen 01/29/16 for htn, Graves disease now on synthroid placement, on synthroid, and knee pain.    Per pt, taking all meds, but diet noncompliance.  Of note, seen in ED 12/4 for knee pain, knee pain persists, was hoping knee aspiration and injection may help today.  Co of significant insomnia as well, hard time sleeping, and frequent awakenings.   Patient has No headache, No chest pain, No abdominal pain - No Nausea, No new weakness tingling or numbness, No Cough - SOB.  No problems updated.  ALLERGIES: Allergies  Allergen Reactions  . Penicillins Swelling    PAST MEDICAL HISTORY: Past Medical History:  Diagnosis Date  . Cocaine abuse   . Grave's disease    s/p radioiodine ablation 03/05/10, F/B Dr. Chestine Spore. On Methimazole  . Hypertension   . Hyperthyroidism   . Scabies   . Thyroid condition   . Tobacco abuse     MEDICATIONS AT HOME: Prior to Admission medications   Medication Sig Start Date End Date Taking? Authorizing Provider  acetaminophen-codeine (TYLENOL #3) 300-30 MG tablet Take 1 tablet by mouth every 6 (six) hours as needed for moderate pain. 01/29/16   Pete Glatter, MD  amLODipine (NORVASC) 10 MG tablet Take 1 tablet (10 mg total) by mouth daily. Must have office visit for refills 03/06/16   Pete Glatter, MD  cyclobenzaprine (FLEXERIL) 10 MG tablet Take 1 tablet (10 mg total) by mouth 3 (three) times daily as needed for muscle spasms. 03/06/16   Pete Glatter, MD  diclofenac sodium (VOLTAREN) 1 % GEL Apply 2 g topically 4 (four) times daily. 03/06/16   Pete Glatter, MD  diphenhydrAMINE (BENADRYL) 25 mg capsule Take 1 capsule (25 mg total) by mouth every 6 (six) hours as needed. 03/04/16   Cheri Fowler, PA-C    famotidine (PEPCID) 20 MG tablet Take 1 tablet (20 mg total) by mouth 2 (two) times daily. 03/04/16   Kayla Rose, PA-C  fluticasone (FLONASE) 50 MCG/ACT nasal spray Place 2 sprays into both nostrils daily. Patient not taking: Reported on 01/29/2016 11/13/15   Pete Glatter, MD  levothyroxine (SYNTHROID, LEVOTHROID) 75 MCG tablet Take 1 tablet (75 mcg total) by mouth daily. Must have office visit for refills 01/08/16   Pete Glatter, MD  loratadine (CLARITIN) 10 MG tablet Take 1 tablet (10 mg total) by mouth daily. 11/13/15   Pete Glatter, MD  losartan (COZAAR) 100 MG tablet Take 1 tablet (100 mg total) by mouth daily. For blood pressure 01/29/16   Pete Glatter, MD  losartan-hydrochlorothiazide (HYZAAR) 100-25 MG tablet Take 1 tablet by mouth daily. 03/06/16   Pete Glatter, MD  Melatonin 5 MG TABS Take 1 tablet (5 mg total) by mouth at bedtime. 03/06/16   Pete Glatter, MD  methocarbamol (ROBAXIN) 500 MG tablet Take 1 tablet (500 mg total) by mouth 2 (two) times daily. 08/23/15   Tatyana Kirichenko, PA-C  metroNIDAZOLE (FLAGYL) 500 MG tablet Take 1 tablet (500 mg total) by mouth 2 (two) times daily. Patient not taking: Reported on 01/29/2016 12/13/15   Pete Glatter, MD  naproxen (NAPROSYN) 500 MG tablet Take 1 tablet (500 mg total) by  mouth 3 (three) times daily as needed. Must have office visit for refills Patient not taking: Reported on 01/29/2016 11/13/15   Pete Glatterawn T Kathleene Bergemann, MD  polyethylene glycol powder (GLYCOLAX/MIRALAX) powder Take 17 g by mouth daily. Patient not taking: Reported on 01/29/2016 11/09/14   Ambrose FinlandValerie A Keck, NP  predniSONE (DELTASONE) 20 MG tablet Take 2 tablets (40 mg total) by mouth daily. 03/04/16   Cheri FowlerKayla Rose, PA-C  traMADol (ULTRAM) 50 MG tablet Take 1 tablet (50 mg total) by mouth every 6 (six) hours as needed for severe pain. 03/06/16   Pete Glatterawn T Salih Williamson, MD     Objective:   Vitals:   03/06/16 1102  BP: (!) 219/106  Pulse: 67  Resp: 16  Temp: 97.8 F  (36.6 C)  TempSrc: Oral  SpO2: 97%  Weight: 197 lb 3.2 oz (89.4 kg)   Repeat bp 160/100  cma assisted during procedure   Exam General appearance : Awake, alert, not in any distress. Speech Clear. Not toxic looking, pleasant HEENT: Atraumatic and Normocephalic, pupils equally reactive to light. Neck: supple, no JVD.   Chest:Good air entry bilaterally, no added sounds. CVS: S1 S2 regular, no murmurs/gallups or rubs. Right knee mildly ttp, very small effusion palpated.  Neurology: Awake alert, and oriented X 3, CN II-XII grossly intact, Non focal Skin:No Rash   Pt gave verbal consent to trial aspiration of right knee. Recent knee films reviewed.  Aspiration/Injection Procedure Note Ann Jarvis [id number] March 21, 1966  Procedure: Aspiration, right knee Indications: knee pain/effusion  Procedure Details Verba consent provided.  Time Out: Verified patient identification, verified procedure, site/side was marked, verified correct patient position, special equipment/implants available, medications/allergies/relevent history reviewed, required imaging and test results available.   Performed  After sterile technique w/ betadine x 3, attempted right lateral aspiration of right knee, but no fluid obtained.  Local Anesthesia Used:spray, local anesthesia spray bottle Amount of Fluid Aspirated: none  No fluid was aspirated, dry tap. Recommend steroid injection, but pt declined at this time.  Patient did tolerate procedure well. Estimated blood loss: none  Prince Couey T Niki Cosman 03/06/2016, 12:55 PM  Data Review No results found for: HGBA1C  Depression screen [facility] 2/9 03/06/2016 01/29/2016 01/08/2016 12/11/2015 11/13/2015  Decreased Interest 0 0 0 0 0  Down, Depressed, Hopeless 0 0 0 0 0  PHQ - 2 Score 0 0 0 0 0  Altered sleeping 2 1 - - -  Tired, decreased energy 0 1 - - -  Change in appetite 0 3 - - -  Feeling bad or failure about yourself  0 0 - - -  Trouble concentrating 0 0 -  - -  Moving slowly or fidgety/restless 0 0 - - -  Suicidal thoughts 0 0 - - -  PHQ-9 Score 2 5 - - -    02/20/16 right knee xray  IMPRESSION: 1. No evidence of fracture or dislocation. 2. Moderate knee joint effusion noted.   Electronically Signed   By: Roanna RaiderJeffery  Chang M.D.   On: 02/20/2016 20:13  Assessment & Plan   1. Essential hypertension, uncontrolled, w/ dietary noncompliance - continue norvasc 10 - changed cozaar 100 to hyzaar 100-25 qd - low salt diet again adressed  2. Insomnia, unspecified type Proper sleep hygiene discussed, info provided, Trial melatonin 5 qhs  3. Right knee pain, unspecified chronicity Attempted aspiration for pain, small effusion noted, however, dry tap. - pt declined steroid injection at this time due to pain - wants to continue to top /po treatment until  pain mgmt eval, which is still pending - renewed voltarin gel, ultram rx, and flexeril  3. Knee effusion, right Dry tap on aspiration  4. Postoperative hypothyroidism, hx of Graves dz Hold off on renewal rx until labs. - TSH - T4, Free - T3, Free  5. Chronic right-sided low back pain with right-sided sciatica - pain mamgt eval still pending. - cyclobenzaprine (FLEXERIL) 10 MG tablet; Take 1 tablet (10 mg total) by mouth 3 (three) times daily as needed for muscle spasms.  Dispense: 60 tablet; Refill: 1  6. Breast cancer screening - MM Digital Screening; Future  7. Pt declined flu vac   Patient have been counseled extensively about nutrition and exercise  Return in about 3 months (around 06/04/2016), or if symptoms worsen or fail to improve.  The patient was given clear instructions to go to ER or return to medical center if symptoms don't improve, worsen or new problems develop. The patient verbalized understanding. The patient was told to call to get lab results if they haven't heard anything in the next week.   This note has been created with [employer] conservator/restorerDragon speech recognition  software and smart phrase technology. Any transcriptional errors are unintentional.   Pete Glatterawn T Faven Watterson, MD, MBA/MHA [facility] [place], [place] [phone]   03/06/2016, 12:43 PM

## 2016-03-07 ENCOUNTER — Other Ambulatory Visit: Payer: Self-pay | Admitting: Internal Medicine

## 2016-03-07 DIAGNOSIS — E890 Postprocedural hypothyroidism: Secondary | ICD-10-CM

## 2016-03-07 MED ORDER — LEVOTHYROXINE SODIUM 100 MCG PO TABS: 100.0000 ug | ORAL_TABLET | Freq: Every day | ORAL | 1 refills | Status: DC

## 2016-03-07 MED FILL — LEVOTHYROXINE 100 MCG TAB: 100 | 30 days supply | Qty: 30 | Fill #0

## 2016-03-07 MED FILL — traMADol HCL 50 MG TABS: 50 | 10 days supply | Qty: 60 | Fill #0

## 2016-03-11 ENCOUNTER — Emergency Department (HOSPITAL_COMMUNITY)
Admission: EM | Admit: 2016-03-11 | Discharge: 2016-03-11 | Disposition: A | Discharge status: Home / Self Care | Payer: Self-pay | Attending: Emergency Medicine | Admitting: Emergency Medicine

## 2016-03-11 ENCOUNTER — Emergency Department (HOSPITAL_COMMUNITY): Payer: Self-pay

## 2016-03-11 ENCOUNTER — Telehealth: Payer: Self-pay

## 2016-03-11 ENCOUNTER — Encounter (HOSPITAL_COMMUNITY): Payer: Self-pay | Admitting: *Deleted

## 2016-03-11 DIAGNOSIS — I160 Hypertensive urgency: Secondary | ICD-10-CM | POA: Insufficient documentation

## 2016-03-11 DIAGNOSIS — E039 Hypothyroidism, unspecified: Secondary | ICD-10-CM | POA: Insufficient documentation

## 2016-03-11 DIAGNOSIS — R519 Headache, unspecified: Secondary | ICD-10-CM

## 2016-03-11 DIAGNOSIS — R51 Headache: Secondary | ICD-10-CM

## 2016-03-11 DIAGNOSIS — F17210 Nicotine dependence, cigarettes, uncomplicated: Secondary | ICD-10-CM | POA: Insufficient documentation

## 2016-03-11 LAB — CBC WITH DIFFERENTIAL/PLATELET
Basophils Absolute: 0 10*3/uL (ref 0.0–0.1)
Basophils Relative: 1 %
Eosinophils Absolute: 0.1 10*3/uL (ref 0.0–0.7)
Eosinophils Relative: 2 %
HEMATOCRIT: 43.4 % (ref 36.0–46.0)
HEMOGLOBIN: 14.3 g/dL (ref 12.0–15.0)
LYMPHS ABS: 1.7 10*3/uL (ref 0.7–4.0)
LYMPHS PCT: 27 %
MCH: 27.1 pg (ref 26.0–34.0)
MCHC: 32.9 g/dL (ref 30.0–36.0)
MCV: 82.4 fL (ref 78.0–100.0)
MONOS PCT: 7 %
Monocytes Absolute: 0.4 10*3/uL (ref 0.1–1.0)
NEUTROS ABS: 3.9 10*3/uL (ref 1.7–7.7)
NEUTROS PCT: 63 %
Platelets: 307 10*3/uL (ref 150–400)
RBC: 5.27 MIL/uL — ABNORMAL HIGH (ref 3.87–5.11)
RDW: 14.4 % (ref 11.5–15.5)
WBC: 6.1 10*3/uL (ref 4.0–10.5)

## 2016-03-11 LAB — BASIC METABOLIC PANEL
ANION GAP: 9 (ref 5–15)
BUN: 16 mg/dL (ref 6–20)
CHLORIDE: 99 mmol/L — AB (ref 101–111)
CO2: 29 mmol/L (ref 22–32)
Calcium: 9.4 mg/dL (ref 8.9–10.3)
Creatinine, Ser: 1.27 mg/dL — ABNORMAL HIGH (ref 0.44–1.00)
GFR calc non Af Amer: 48 mL/min — ABNORMAL LOW (ref >60)
GFR, EST AFRICAN AMERICAN: 55 mL/min — AB (ref >60)
Glucose, Bld: 94 mg/dL (ref 65–99)
Potassium: 3.7 mmol/L (ref 3.5–5.1)
Sodium: 137 mmol/L (ref 135–145)

## 2016-03-11 MED ORDER — CLONIDINE HCL 0.2 MG PO TABS
0.2000 mg | ORAL_TABLET | Freq: Once | ORAL | Status: AC
  Administered 2016-03-11: 0.2 mg via ORAL
  Filled 2016-03-11: qty 1

## 2016-03-11 MED ORDER — HYDROCODONE-ACETAMINOPHEN 5-325 MG PO TABS: 1.0000 | ORAL_TABLET | Freq: Four times a day (QID) | ORAL | 0 refills | Status: DC | PRN

## 2016-03-11 NOTE — ED Triage Notes (Signed)
Pt states sharp pains to L side of head since last night.  Denies any other symptoms - denies nausea, blurred vision, weakness.

## 2016-03-11 NOTE — Telephone Encounter (Signed)
Contacted pt to go over lab results pt states she has been taking her thyroid medicine and just picked up the new rx for it with the increased dose. Pt states she doesn't have any questions or concerns

## 2016-03-11 NOTE — ED Provider Notes (Signed)
[facility]-EMER[facility]ENCY DEPT Provider Note   CSN: [id number] Arrival date & time: 03/11/16  0957     History   Chief Complaint Chief Complaint  Patient presents with  . Headache    "sharp pains"    HPI Ann Jarvis is a 50 y.o. female.  Patient is a 50 year old female with past medical history of hypertension and hyperthyroidism. She presents for evaluation of headache. This started yesterday evening and has been persistent. The pain is located in the left side of her head and is not worse with any palpation. She denies any weakness or numbness in her hands or feet. She denies any neck pain or fever. She recently had medication changes with her antihypertensives.   The history is provided by the patient.  Headache   This is a new problem. The current episode started yesterday. The problem occurs constantly. The problem has not changed since onset.The headache is associated with nothing. The pain is located in the left unilateral region. The quality of the pain is described as throbbing. The pain is moderate. The pain does not radiate. Pertinent negatives include no anorexia and no nausea. She has tried nothing for the symptoms. The treatment provided no relief.    Past Medical History:  Diagnosis Date  . Cocaine abuse   . Grave's disease    s/p radioiodine ablation 03/05/10, F/B Dr. Chestine Sporelark. On Methimazole  . Hypertension   . Hyperthyroidism   . Scabies   . Thyroid condition   . Tobacco abuse     Patient Active Problem List   Diagnosis Date Noted  . Hypothyroidism 11/13/2015  . Bell's palsy 10/09/2011  . DENTAL PAIN 08/01/2009  . PRURITUS 03/07/2009  . TOBACCO ABUSE 10/10/2008  . COCAINE ABUSE 08/17/2007  . Essential hypertension, benign 08/17/2007  . ABSCESS, TOOTH 08/17/2007  . CHEST PAIN UNSPECIFIED 08/17/2007  . SHOULDER PAIN, LEFT 07/27/2007  . GRAVE'S DISEASE 12/18/2006    Past Surgical History:  Procedure Laterality Date  . BTL    . MOUTH SURGERY      OB  History    No data available       Home Medications    Prior to Admission medications   Medication Sig Start Date End Date Taking? Authorizing Provider  amLODipine (NORVASC) 10 MG tablet Take 1 tablet (10 mg total) by mouth daily. Must have office visit for refills 03/06/16   Pete Glatterawn T Langeland, MD  cyclobenzaprine (FLEXERIL) 10 MG tablet Take 1 tablet (10 mg total) by mouth 3 (three) times daily as needed for muscle spasms. 03/06/16   Pete Glatterawn T Langeland, MD  diclofenac sodium (VOLTAREN) 1 % GEL Apply 2 g topically 4 (four) times daily. 03/06/16   Pete Glatterawn T Langeland, MD  diphenhydrAMINE (BENADRYL) 25 mg capsule Take 1 capsule (25 mg total) by mouth every 6 (six) hours as needed. 03/04/16   Cheri FowlerKayla Rose, PA-C  famotidine (PEPCID) 20 MG tablet Take 1 tablet (20 mg total) by mouth 2 (two) times daily. 03/04/16   Kayla Rose, PA-C  fluticasone (FLONASE) 50 MCG/ACT nasal spray Place 2 sprays into both nostrils daily. Patient not taking: Reported on 01/29/2016 11/13/15   Pete Glatterawn T Langeland, MD  levothyroxine (SYNTHROID, LEVOTHROID) 100 MCG tablet Take 1 tablet (100 mcg total) by mouth daily. Must have office visit for refills 03/07/16   Pete Glatterawn T Langeland, MD  loratadine (CLARITIN) 10 MG tablet Take 1 tablet (10 mg total) by mouth daily. 11/13/15   Pete Glatterawn T Langeland, MD  losartan (COZAAR) 100 MG tablet  Take 1 tablet (100 mg total) by mouth daily. For blood pressure 01/29/16   Pete Glatterawn T Langeland, MD  losartan-hydrochlorothiazide (HYZAAR) 100-25 MG tablet Take 1 tablet by mouth daily. 03/06/16   Pete Glatterawn T Langeland, MD  Melatonin 5 MG TABS Take 1 tablet (5 mg total) by mouth at bedtime. 03/06/16   Pete Glatterawn T Langeland, MD  methocarbamol (ROBAXIN) 500 MG tablet Take 1 tablet (500 mg total) by mouth 2 (two) times daily. 08/23/15   Tatyana Kirichenko, PA-C  polyethylene glycol powder (GLYCOLAX/MIRALAX) powder Take 17 g by mouth daily. Patient not taking: Reported on 01/29/2016 11/09/14   Ambrose FinlandValerie A Keck, NP  predniSONE (DELTASONE) 20 MG  tablet Take 2 tablets (40 mg total) by mouth daily. 03/04/16   Cheri FowlerKayla Rose, PA-C  traMADol (ULTRAM) 50 MG tablet Take 1 tablet (50 mg total) by mouth every 6 (six) hours as needed for severe pain. 03/06/16   Pete Glatterawn T Langeland, MD    Family History No family history on file.  Social History Social History  Substance Use Topics  . Smoking status: Current Some Day Smoker    Packs/day: 0.25    Types: Cigarettes  . Smokeless tobacco: Never Used     Comment: 1 cig a day  . Alcohol use 0.6 - 1.2 oz/week    1 - 2 Cans of beer per week     Comment: occ     Allergies   Penicillins   Review of Systems Review of Systems  Gastrointestinal: Negative for anorexia and nausea.  Neurological: Positive for headaches.  All other systems reviewed and are negative.    Physical Exam Updated Vital Signs BP (!) 204/106   Pulse 70   Temp 98.5 F (36.9 C) (Oral)   Resp 18   Ht 5\' 6"  (1.676 m)   Wt 193 lb (87.5 kg)   LMP 10/07/2015   SpO2 99%   BMI 31.15 kg/m   Physical Exam  Constitutional: She is oriented to person, place, and time. She appears well-developed and well-nourished. No distress.  HENT:  Head: Normocephalic and atraumatic.  Eyes: EOM are normal. Pupils are equal, round, and reactive to light.  Neck: Normal range of motion. Neck supple.  Cardiovascular: Normal rate and regular rhythm.  Exam reveals no gallop and no friction rub.   No murmur heard. Pulmonary/Chest: Effort normal and breath sounds normal. No respiratory distress. She has no wheezes.  Abdominal: Soft. Bowel sounds are normal. She exhibits no distension. There is no tenderness.  Musculoskeletal: Normal range of motion.  Neurological: She is alert and oriented to person, place, and time. No cranial nerve deficit. She exhibits normal muscle tone. Coordination normal.  Skin: Skin is warm and dry. She is not diaphoretic.  Nursing note and vitals reviewed.    ED Treatments / Results  Labs (all labs ordered are  listed, but only abnormal results are displayed) Labs Reviewed  BASIC METABOLIC PANEL  CBC WITH DIFFERENTIAL/PLATELET    EKG  EKG Interpretation None       Radiology No results found.  Procedures Procedures (including critical care time)  Medications Ordered in ED Medications - No data to display   Initial Impression / Assessment and Plan / ED Course  I have reviewed the triage vital signs and the nursing notes.  Pertinent labs & imaging results that were available during my care of the patient were reviewed by me and considered in my medical decision making (see chart for details).  Clinical Course  Patient presents with headache and elevated blood pressure. Her physical examination is unremarkable and she is neurologically intact. Her laboratory studies show no evidence for end organ damage and her CT scan reveals no acute stroke or bleed. She was given clonidine and her blood pressure has improved somewhat. She will also be given medication for her headache and advised to keep a record of her blood pressures that she can take to her next doctor's appointment. She apparently had recent changes in her blood pressure medications and these may require more time to take effect. She understands to return to the ER if symptoms worsen or change.  Final Clinical Impressions(s) / ED Diagnoses   Final diagnoses:  None    New Prescriptions New Prescriptions   No medications on file     Geoffery Lyons, MD 03/11/16 1530

## 2016-03-11 NOTE — Discharge Instructions (Signed)
Continue your blood pressure medications as previously prescribed. Keep a record of your blood pressures and take these with you to your next doctor's appointment.  Hydrocodone as prescribed as needed for pain.

## 2016-03-11 NOTE — ED Notes (Signed)
Unhooked pt to go to restroom urine cup and wipe given. Urine sample at bedside waiting to be sent down.

## 2016-04-04 ENCOUNTER — Other Ambulatory Visit: Payer: Self-pay | Admitting: Internal Medicine

## 2016-04-04 DIAGNOSIS — G894 Chronic pain syndrome: Secondary | ICD-10-CM

## 2016-04-04 MED FILL — LOSARTAN-HCTZ 100-25 MG TAB: 100-25 | 30 days supply | Qty: 30 | Fill #1

## 2016-04-04 MED FILL — AMLODIPINE BESYLATE 10 MG T: 10 | 30 days supply | Qty: 30 | Fill #1

## 2016-04-04 MED FILL — LEVOTHYROXINE 100 MCG TAB: 100 | 30 days supply | Qty: 30 | Fill #1

## 2016-04-08 NOTE — Telephone Encounter (Signed)
Contacted pt to make aware of rx

## 2016-04-08 NOTE — Telephone Encounter (Signed)
Ultram rx renewed, please do random urine drug screen when she comes to pick up rx next. thanks

## 2016-04-09 ENCOUNTER — Ambulatory Visit: Payer: Self-pay | Attending: Internal Medicine

## 2016-04-09 ENCOUNTER — Other Ambulatory Visit: Payer: Self-pay | Admitting: Internal Medicine

## 2016-04-09 DIAGNOSIS — G894 Chronic pain syndrome: Secondary | ICD-10-CM | POA: Insufficient documentation

## 2016-04-09 MED FILL — traMADol HCL 50 MG TABS: 50 | 20 days supply | Qty: 60 | Fill #0

## 2016-04-09 NOTE — Progress Notes (Signed)
Patient here for Rx and lab visit

## 2016-04-14 LAB — DRUG ABUSE PANEL 10-50, U
AMPHETAMINES (1000 NG/ML SCRN): NEGATIVE
BARBITURATES: NEGATIVE
BENZODIAZEPINES: NEGATIVE
COCAINE METABOLITES: POSITIVE — AB
MARIJUANA MET (50 NG/ML SCRN): POSITIVE — AB
METHADONE: NEGATIVE
METHAQUALONE: NEGATIVE
OPIATES: NEGATIVE
PHENCYCLIDINE: NEGATIVE
PROPOXYPHENE: NEGATIVE

## 2016-04-15 ENCOUNTER — Telehealth: Payer: Self-pay

## 2016-04-15 NOTE — Telephone Encounter (Signed)
Contacted pt to go over urine results pt is aware of results and is aware that Dr. Julien NordmannLangeland will not be filling anymore narcotics. Pt states she understands and doesn't have any questions or concerns

## 2016-04-22 ENCOUNTER — Ambulatory Visit: Payer: Self-pay | Admitting: Internal Medicine

## 2016-04-29 ENCOUNTER — Encounter: Payer: Self-pay | Admitting: Physical Medicine & Rehabilitation

## 2016-04-29 ENCOUNTER — Ambulatory Visit: Payer: Self-pay | Admitting: Physical Medicine & Rehabilitation

## 2016-04-29 ENCOUNTER — Ambulatory Visit: Payer: Self-pay | Admitting: Internal Medicine

## 2016-05-07 ENCOUNTER — Ambulatory Visit: Payer: Self-pay | Attending: Internal Medicine | Admitting: Internal Medicine

## 2016-05-07 ENCOUNTER — Encounter: Payer: Self-pay | Admitting: Internal Medicine

## 2016-05-07 VITALS — BP 148/82 | HR 70 | Temp 98.4°F | Resp 16 | Wt 205.4 lb

## 2016-05-07 DIAGNOSIS — F1410 Cocaine abuse, uncomplicated: Secondary | ICD-10-CM | POA: Insufficient documentation

## 2016-05-07 DIAGNOSIS — M25552 Pain in left hip: Secondary | ICD-10-CM | POA: Insufficient documentation

## 2016-05-07 DIAGNOSIS — N183 Chronic kidney disease, stage 3 unspecified: Secondary | ICD-10-CM

## 2016-05-07 DIAGNOSIS — M25551 Pain in right hip: Secondary | ICD-10-CM | POA: Insufficient documentation

## 2016-05-07 DIAGNOSIS — I10 Essential (primary) hypertension: Secondary | ICD-10-CM

## 2016-05-07 DIAGNOSIS — Z880 Allergy status to penicillin: Secondary | ICD-10-CM | POA: Insufficient documentation

## 2016-05-07 DIAGNOSIS — Z720 Tobacco use: Secondary | ICD-10-CM | POA: Insufficient documentation

## 2016-05-07 DIAGNOSIS — G8929 Other chronic pain: Secondary | ICD-10-CM

## 2016-05-07 DIAGNOSIS — E0500 Thyrotoxicosis with diffuse goiter without thyrotoxic crisis or storm: Secondary | ICD-10-CM | POA: Insufficient documentation

## 2016-05-07 DIAGNOSIS — E890 Postprocedural hypothyroidism: Secondary | ICD-10-CM

## 2016-05-07 DIAGNOSIS — Z9889 Other specified postprocedural states: Secondary | ICD-10-CM | POA: Insufficient documentation

## 2016-05-07 DIAGNOSIS — I129 Hypertensive chronic kidney disease with stage 1 through stage 4 chronic kidney disease, or unspecified chronic kidney disease: Secondary | ICD-10-CM | POA: Insufficient documentation

## 2016-05-07 DIAGNOSIS — M545 Low back pain: Secondary | ICD-10-CM

## 2016-05-07 DIAGNOSIS — Z131 Encounter for screening for diabetes mellitus: Secondary | ICD-10-CM

## 2016-05-07 LAB — BASIC METABOLIC PANEL WITH GFR
BUN: 15 mg/dL (ref 7–25)
CHLORIDE: 102 mmol/L (ref 98–110)
CO2: 28 mmol/L (ref 20–31)
Calcium: 9.1 mg/dL (ref 8.6–10.4)
Creat: 1.11 mg/dL — ABNORMAL HIGH (ref 0.50–1.05)
GFR, Est African American: 66 mL/min (ref >=60)
GFR, Est Non African American: 57 mL/min — ABNORMAL LOW (ref >=60)
GLUCOSE: 75 mg/dL (ref 65–99)
POTASSIUM: 3.6 mmol/L (ref 3.5–5.3)
Sodium: 138 mmol/L (ref 135–146)

## 2016-05-07 LAB — POCT GLYCOSYLATED HEMOGLOBIN (HGB A1C): Hemoglobin A1C: 5.3

## 2016-05-07 LAB — TSH: TSH: 4.71 mIU/L — ABNORMAL HIGH

## 2016-05-07 MED ORDER — DICLOFENAC SODIUM 1 % TD GEL
2.0000 g | Freq: Four times a day (QID) | TRANSDERMAL | 2 refills | Status: DC
Start: 2016-05-07 — End: 2016-07-22

## 2016-05-07 NOTE — Progress Notes (Signed)
Ann Jarvis, is a 51 y.o. female  BJY:[id number]  YQM:[id number]  DOB - 1966-02-04  Chief Complaint  Patient presents with  . Hip Pain        Subjective:   Ann Jarvis is a 51 y.o. female here today for a follow up visit for htn and lower back pains/bilat hip pains. She has been taking all her meds.  Just had hamburger lunch prior and was rushing here.  When she had PMR visit 04/29/16 (which was canceled since her [employer] card expired), her bp was 130/78 at time.  Co of intermittent bilat hip pains/lower back pains, volterin gel has helped and otc meds, currently not pain.   Patient has No headache, No chest pain, No abdominal pain - No Nausea, No new weakness tingling or numbness, No Cough - SOB.  No problems updated.  ALLERGIES: Allergies  Allergen Reactions  . Penicillins Swelling    PAST MEDICAL HISTORY: Past Medical History:  Diagnosis Date  . Cocaine abuse   . Grave's disease    s/p radioiodine ablation 03/05/10, F/B Dr. Chestine Spore. On Methimazole  . Hypertension   . Hyperthyroidism   . Scabies   . Thyroid condition   . Tobacco abuse     MEDICATIONS AT HOME: Prior to Admission medications   Medication Sig Start Date End Date Taking? Authorizing Provider  amLODipine (NORVASC) 10 MG tablet Take 1 tablet (10 mg total) by mouth daily. Must have office visit for refills 03/06/16   Pete Glatter, MD  cyclobenzaprine (FLEXERIL) 10 MG tablet Take 1 tablet (10 mg total) by mouth 3 (three) times daily as needed for muscle spasms. Patient not taking: Reported on 03/11/2016 03/06/16   Pete Glatter, MD  diclofenac sodium (VOLTAREN) 1 % GEL Apply 2 g topically 4 (four) times daily. 05/07/16   Pete Glatter, MD  fluticasone (FLONASE) 50 MCG/ACT nasal spray Place 2 sprays into both nostrils daily. Patient not taking: Reported on 03/11/2016 11/13/15   Pete Glatter, MD  HYDROcodone-acetaminophen (NORCO) 5-325 MG tablet Take 1-2 tablets by mouth every 6 (six) hours  as needed. 03/11/16   Geoffery Lyons, MD  levothyroxine (SYNTHROID, LEVOTHROID) 100 MCG tablet Take 1 tablet (100 mcg total) by mouth daily. Must have office visit for refills 03/07/16   Pete Glatter, MD  losartan (COZAAR) 100 MG tablet Take 1 tablet (100 mg total) by mouth daily. For blood pressure 01/29/16   Pete Glatter, MD  losartan-hydrochlorothiazide (HYZAAR) 100-25 MG tablet Take 1 tablet by mouth daily. 03/06/16   Pete Glatter, MD  Melatonin 5 MG TABS Take 1 tablet (5 mg total) by mouth at bedtime. Patient not taking: Reported on 04/29/2016 03/06/16   Pete Glatter, MD  methocarbamol (ROBAXIN) 500 MG tablet Take 1 tablet (500 mg total) by mouth 2 (two) times daily. 08/23/15   Tatyana Kirichenko, PA-C  polyethylene glycol powder (GLYCOLAX/MIRALAX) powder Take 17 g by mouth daily. Patient not taking: Reported on 04/29/2016 11/09/14   Ambrose Finland, NP     Objective:   Vitals:   05/07/16 1343  BP: (!) 148/82  Pulse: 70  Resp: 16  Temp: 98.4 F (36.9 C)  TempSrc: Oral  SpO2: 95%  Weight: 205 lb 6.4 oz (93.2 kg)   Initial bp high, repeat remain elevated.  Exam General appearance : Awake, alert, not in any distress. Speech Clear. Not toxic looking, pleasant HEENT: Atraumatic and Normocephalic, pupils equally reactive to light. Neck: supple, no JVD.  Chest:Good  air entry bilaterally, no added sounds. CVS: S1 S2 regular, no murmurs/gallups or rubs. Abdomen: Bowel sounds active, Non tender and not distended with no gaurding, rigidity or rebound. Extremities: rom intact, bilat hip palpation nttp today.;  B/L Lower Ext shows no edema, both legs are warm to touch Neurology: Awake alert, and oriented X 3, CN II-XII grossly intact, Non focal Skin:No Rash  Data Review No results found for: HGBA1C  Depression screen [facility] 2/9 05/07/2016 03/06/2016 01/29/2016 01/08/2016 12/11/2015  Decreased Interest 0 0 0 0 0  Down, Depressed, Hopeless 0 0 0 0 0  PHQ - 2 Score 0 0 0 0 0  Altered  sleeping - 2 1 - -  Tired, decreased energy - 0 1 - -  Change in appetite - 0 3 - -  Feeling bad or failure about yourself  - 0 0 - -  Trouble concentrating - 0 0 - -  Moving slowly or fidgety/restless - 0 0 - -  Suicidal thoughts - 0 0 - -  PHQ-9 Score - 2 5 - -      Assessment & Plan   1. Essential hypertension, benign Continue norvasc 10, hyzaar 100-25 qd for now. Needs to be on low salt diet!  2000mg  recd. - repeat bp in 2 wks w/ RN, if sbp >130s, than add hydralazine 10tid.  2. Postoperative hypothyroidism rechk prior to renewing rx. - TSH  3. Diabetes mellitus screening - HgB A1c  4. CKD (chronic kidney disease) stage 3, GFR 30-59 ml/min Needs tighter bp control - BASIC METABOLIC PANEL WITH GFR  5. Chronic bilateral low back pain without sciatica xrays of left hips 08/23/15 unremarkable. Recd RICE, warm heat, volterin gel prn, back stretches/exercises advised, info provided.     Patient have been counseled extensively about nutrition and exercise  Return in about 3 months (around 08/04/2016).  The patient was given clear instructions to go to ER or return to medical center if symptoms don't improve, worsen or new problems develop. The patient verbalized understanding. The patient was told to call to get lab results if they haven't heard anything in the next week.   This note has been created with Education officer, environmentalDragon speech recognition software and smart phrase technology. Any transcriptional errors are unintentional.   Pete Glatterawn T Donyetta Ogletree, MD, MBA/MHA [facility] [place], [place] [phone]   05/07/2016, 2:14 PM

## 2016-05-07 NOTE — Patient Instructions (Signed)
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  Low-Sodium Eating Plan Sodium raises blood pressure and causes water to be held in the body. Getting less sodium from food will help lower your blood pressure, reduce any swelling, and protect your heart, liver, and kidneys. We get sodium by adding salt (sodium chloride) to food. Most of our sodium comes from canned, boxed, and frozen foods. Restaurant foods, fast foods, and pizza are also very high in sodium. Even if you take medicine to lower your blood pressure or to reduce fluid in your body, getting less sodium from your food is important. What is my plan? Most people should limit their sodium intake to 2,300 mg a day. Your health care provider recommends that you limit your sodium intake to 000mg  a day. What do I need to know about this eating plan? For the low-sodium eating plan, you will follow these general guidelines:  Choose foods with a % Daily Value for sodium of less than 5% (as listed on the food label).  Use salt-free seasonings or herbs instead of table salt or sea salt.  Check with your health care provider or pharmacist before using salt substitutes.  Eat fresh foods.  Eat more vegetables and fruits.  Limit canned vegetables. If you do use them, rinse them well to decrease the sodium.  Limit cheese to 1 oz (28 g) per day.  Eat lower-sodium products, often labeled as "lower sodium" or "no salt added."  Avoid foods that contain monosodium glutamate (MSG). MSG is sometimes added to [place] food and some canned foods.  Check food labels (Nutrition Facts labels) on foods to learn how much sodium is in one serving.  Eat more home-cooked food and less restaurant, buffet, and fast food.  When eating at a restaurant, ask that your food be prepared with less salt, or no salt if possible. How do I read food labels for sodium information? The Nutrition Facts label lists the amount of sodium in one serving of the food. If you eat more than one  serving, you must multiply the listed amount of sodium by the number of servings. Food labels may also identify foods as:  Sodium free-Less than 5 mg in a serving.  Very low sodium-35 mg or less in a serving.  Low sodium-140 mg or less in a serving.  Light in sodium-50% less sodium in a serving. For example, if a food that usually has 300 mg of sodium is changed to become light in sodium, it will have 150 mg of sodium.  Reduced sodium-25% less sodium in a serving. For example, if a food that usually has 400 mg of sodium is changed to reduced sodium, it will have 300 mg of sodium. What foods can I eat? Grains  Low-sodium cereals, including oats, puffed wheat and rice, and shredded wheat cereals. Low-sodium crackers. Unsalted rice and pasta. Lower-sodium bread. Vegetables  Frozen or fresh vegetables. Low-sodium or reduced-sodium canned vegetables. Low-sodium or reduced-sodium tomato sauce and paste. Low-sodium or reduced-sodium tomato and vegetable juices. Fruits  Fresh, frozen, and canned fruit. Fruit juice. Meat and Other Protein Products  Low-sodium canned tuna and salmon. Fresh or frozen meat, poultry, seafood, and fish. Lamb. Unsalted nuts. Dried beans, peas, and lentils without added salt. Unsalted canned beans. Homemade soups without salt. Eggs. Dairy  Milk. Soy milk. Ricotta cheese. Low-sodium or reduced-sodium cheeses. Yogurt. Condiments  Fresh and dried herbs and spices. Salt-free seasonings. Onion and garlic powders. Low-sodium varieties of mustard and ketchup. Fresh or refrigerated  horseradish. Lemon juice. Fats and Oils  Reduced-sodium salad dressings. Unsalted butter. Other  Unsalted popcorn and pretzels. The items listed above may not be a complete list of recommended foods or beverages. Contact your dietitian for more options.  What foods are not recommended? Grains  Instant hot cereals. Bread stuffing, pancake, and biscuit mixes. Croutons. Seasoned rice or pasta mixes.  Noodle soup cups. Boxed or frozen macaroni and cheese. Self-rising flour. Regular salted crackers. Vegetables  Regular canned vegetables. Regular canned tomato sauce and paste. Regular tomato and vegetable juices. Frozen vegetables in sauces. Salted [place] fries. Olives. Rosita Fire. Relishes. Sauerkraut. Salsa. Meat and Other Protein Products  Salted, canned, smoked, spiced, or pickled meats, seafood, or fish. Bacon, ham, sausage, hot dogs, corned beef, chipped beef, and packaged luncheon meats. Salt pork. Jerky. Pickled herring. Anchovies, regular canned tuna, and sardines. Salted nuts. Dairy  Processed cheese and cheese spreads. Cheese curds. Blue cheese and cottage cheese. Buttermilk. Condiments  Onion and garlic salt, seasoned salt, table salt, and sea salt. Canned and packaged gravies. Worcestershire sauce. Tartar sauce. Barbecue sauce. Teriyaki sauce. Soy sauce, including reduced sodium. Steak sauce. Fish sauce. Oyster sauce. Cocktail sauce. Horseradish that you find on the shelf. Regular ketchup and mustard. Meat flavorings and tenderizers. Bouillon cubes. Hot sauce. Tabasco sauce. Marinades. Taco seasonings. Relishes. Fats and Oils  Regular salad dressings. Salted butter. Margarine. Ghee. Bacon fat. Other  Potato and tortilla chips. Corn chips and puffs. Salted popcorn and pretzels. Canned or dried soups. Pizza. Frozen entrees and pot pies. The items listed above may not be a complete list of foods and beverages to avoid. Contact your dietitian for more information.  This information is not intended to replace advice given to you by your health care provider. Make sure you discuss any questions you have with your health care provider. Document Released: 09/07/2001 Document Revised: 08/24/2015 Document Reviewed: 01/20/2013 Elsevier Interactive Patient Education  2017 [employer]   -   Hypertension Hypertension is another name for high blood pressure. High blood pressure forces your  heart to work harder to pump blood. A blood pressure reading has two numbers, which includes a higher number over a lower number (example: 110/72). Follow these instructions at home:  Have your blood pressure rechecked by your doctor.  Only take medicine as told by your doctor. Follow the directions carefully. The medicine does not work as well if you skip doses. Skipping doses also puts you at risk for problems.  Do not smoke.  Monitor your blood pressure at home as told by your doctor. Contact a doctor if:  You think you are having a reaction to the medicine you are taking.  You have repeat headaches or feel dizzy.  You have puffiness (swelling) in your ankles.  You have trouble with your vision. Get help right away if:  You get a very bad headache and are confused.  You feel weak, numb, or faint.  You get chest or belly (abdominal) pain.  You throw up (vomit).  You cannot breathe very well. This information is not intended to replace advice given to you by your health care provider. Make sure you discuss any questions you have with your health care provider. Document Released: 09/04/2007 Document Revised: 08/24/2015 Document Reviewed: 01/08/2013 Elsevier Interactive Patient Education  2017 [employer] Inc[employer]   -  Back Exercises Introduction If you have pain in your back, do these exercises 2-3 times each day or as told by your doctor. When the pain goes away, do  the exercises once each day, but repeat the steps more times for each exercise (do more repetitions). If you do not have pain in your back, do these exercises once each day or as told by your doctor. Exercises Single Knee to Chest  Do these steps 3-5 times in a row for each leg: 1. Lie on your back on a firm bed or the floor with your legs stretched out. 2. Bring one knee to your chest. 3. Hold your knee to your chest by grabbing your knee or thigh. 4. Pull on your knee until you feel a gentle stretch in your  lower back. 5. Keep doing the stretch for 10-30 seconds. 6. Slowly let go of your leg and straighten it. Pelvic Tilt  Do these steps 5-10 times in a row: 1. Lie on your back on a firm bed or the floor with your legs stretched out. 2. Bend your knees so they point up to the ceiling. Your feet should be flat on the floor. 3. Tighten your lower belly (abdomen) muscles to press your lower back against the floor. This will make your tailbone point up to the ceiling instead of pointing down to your feet or the floor. 4. Stay in this position for 5-10 seconds while you gently tighten your muscles and breathe evenly. Cat-Cow  Do these steps until your lower back bends more easily: 1. Get on your hands and knees on a firm surface. Keep your hands under your shoulders, and keep your knees under your hips. You may put padding under your knees. 2. Let your head hang down, and make your tailbone point down to the floor so your lower back is round like the back of a cat. 3. Stay in this position for 5 seconds. 4. Slowly lift your head and make your tailbone point up to the ceiling so your back hangs low (sags) like the back of a cow. 5. Stay in this position for 5 seconds. Press-Ups  Do these steps 5-10 times in a row: 1. Lie on your belly (face-down) on the floor. 2. Place your hands near your head, about shoulder-width apart. 3. While you keep your back relaxed and keep your hips on the floor, slowly straighten your arms to raise the top half of your body and lift your shoulders. Do not use your back muscles. To make yourself more comfortable, you may change where you place your hands. 4. Stay in this position for 5 seconds. 5. Slowly return to lying flat on the floor. Bridges  Do these steps 10 times in a row: 1. Lie on your back on a firm surface. 2. Bend your knees so they point up to the ceiling. Your feet should be flat on the floor. 3. Tighten your butt muscles and lift your butt off of the  floor until your waist is almost as high as your knees. If you do not feel the muscles working in your butt and the back of your thighs, slide your feet 1-2 inches farther away from your butt. 4. Stay in this position for 3-5 seconds. 5. Slowly lower your butt to the floor, and let your butt muscles relax. If this exercise is too easy, try doing it with your arms crossed over your chest. Belly Crunches  Do these steps 5-10 times in a row: 1. Lie on your back on a firm bed or the floor with your legs stretched out. 2. Bend your knees so they point up to the ceiling. Your feet  should be flat on the floor. 3. Cross your arms over your chest. 4. Tip your chin a little bit toward your chest but do not bend your neck. 5. Tighten your belly muscles and slowly raise your chest just enough to lift your shoulder blades a tiny bit off of the floor. 6. Slowly lower your chest and your head to the floor. Back Lifts  Do these steps 5-10 times in a row: 1. Lie on your belly (face-down) with your arms at your sides, and rest your forehead on the floor. 2. Tighten the muscles in your legs and your butt. 3. Slowly lift your chest off of the floor while you keep your hips on the floor. Keep the back of your head in line with the curve in your back. Look at the floor while you do this. 4. Stay in this position for 3-5 seconds. 5. Slowly lower your chest and your face to the floor. Contact a doctor if:  Your back pain gets a lot worse when you do an exercise.  Your back pain does not lessen 2 hours after you exercise. If you have any of these problems, stop doing the exercises. Do not do them again unless your doctor says it is okay. Get help right away if:  You have sudden, very bad back pain. If this happens, stop doing the exercises. Do not do them again unless your doctor says it is okay. This information is not intended to replace advice given to you by your health care provider. Make sure you discuss  any questions you have with your health care provider. Document Released: 04/20/2010 Document Revised: 08/24/2015 Document Reviewed: 05/12/2014  2017 Elsevier  -  Back Pain, Adult Introduction Back pain is very common. The pain often gets better over time. The cause of back pain is usually not dangerous. Most people can learn to manage their back pain on their own. Follow these instructions at home: Watch your back pain for any changes. The following actions may help to lessen any pain you are feeling:  Stay active. Start with short walks on flat ground if you can. Try to walk farther each day.  Exercise regularly as told by your doctor. Exercise helps your back heal faster. It also helps avoid future injury by keeping your muscles strong and flexible.  Do not sit, drive, or stand in one place for more than 30 minutes.  Do not stay in bed. Resting more than 1-2 days can slow down your recovery.  Be careful when you bend or lift an object. Use good form when lifting:  Bend at your knees.  Keep the object close to your body.  Do not twist.  Sleep on a firm mattress. Lie on your side, and bend your knees. If you lie on your back, put a pillow under your knees.  Take medicines only as told by your doctor.  Put ice on the injured area.  Put ice in a plastic bag.  Place a towel between your skin and the bag.  Leave the ice on for 20 minutes, 2-3 times a day for the first 2-3 days. After that, you can switch between ice and heat packs.  Avoid feeling anxious or stressed. Find good ways to deal with stress, such as exercise.  Maintain a healthy weight. Extra weight puts stress on your back. Contact a doctor if:  You have pain that does not go away with rest or medicine.  You have worsening pain that goes down  into your legs or buttocks.  You have pain that does not get better in one week.  You have pain at night.  You lose weight.  You have a fever or chills. Get help  right away if:  You cannot control when you poop (bowel movement) or pee (urinate).  Your arms or legs feel weak.  Your arms or legs lose feeling (numbness).  You feel sick to your stomach (nauseous) or throw up (vomit).  You have belly (abdominal) pain.  You feel like you may pass out (faint). This information is not intended to replace advice given to you by your health care provider. Make sure you discuss any questions you have with your health care provider. Document Released: 09/04/2007 Document Revised: 08/24/2015 Document Reviewed: 07/20/2013  2017 Elsevier  -  RICE for Routine Care of Injuries Introduction Many injuries can be cared for using rest, ice, compression, and elevation (RICE therapy). Using RICE therapy can help to lessen pain and swelling. It can help your body to heal. Rest  Reduce your normal activities and avoid using the injured part of your body. You can go back to your normal activities when you feel okay and your doctor says it is okay. Ice  Do not put ice on your bare skin.  Put ice in a plastic bag.  Place a towel between your skin and the bag.  Leave the ice on for 20 minutes, 2-3 times a day. Do this for as long as told by your doctor. Compression  Compression means putting pressure on the injured area. This can be done with an elastic bandage. If an elastic bandage has been applied:  Remove and reapply the bandage every 3-4 hours or as told by your doctor.  Make sure the bandage is not wrapped too tight. Wrap the bandage more loosely if part of your body beyond the bandage is blue, swollen, cold, painful, or loses feeling (numb).  See your doctor if the bandage seems to make your problems worse. Elevation  Elevation means keeping the injured area raised. Raise the injured area above your heart or the center of your chest if you can. When should I get help? You should get help if:  You keep having pain and swelling.  Your symptoms get  worse. Get help right away if: You should get help right away if:  You have sudden bad pain at or below the area of your injury.  You have redness or more swelling around your injury.  You have tingling or numbness at or below the injury that does not go away when you take off the bandage. This information is not intended to replace advice given to you by your health care provider. Make sure you discuss any questions you have with your health care provider. Document Released: 09/04/2007 Document Revised: 08/24/2015 Document Reviewed: 02/23/2014  2017 Elsevier

## 2016-05-08 ENCOUNTER — Other Ambulatory Visit: Payer: Self-pay | Admitting: Internal Medicine

## 2016-05-08 MED ORDER — LEVOTHYROXINE SODIUM 112 MCG PO TABS: 112.0000 ug | ORAL_TABLET | Freq: Every day | ORAL | 3 refills | Status: DC

## 2016-05-08 MED FILL — LEVOTHYROXINE 112 MCG TAB: 112 | 30 days supply | Qty: 30 | Fill #0

## 2016-05-09 ENCOUNTER — Telehealth: Payer: Self-pay | Admitting: Internal Medicine

## 2016-05-09 ENCOUNTER — Telehealth: Payer: Self-pay

## 2016-05-09 NOTE — Telephone Encounter (Signed)
Contacted pt to go over lab results pt is aware of results and doesn't have any questions or concerns 

## 2016-05-09 NOTE — Telephone Encounter (Signed)
Pt returning call regarding results.  

## 2016-05-10 NOTE — Telephone Encounter (Signed)
Returned pt call pt has a better understanding of results

## 2016-05-15 ENCOUNTER — Ambulatory Visit: Payer: Self-pay | Attending: Internal Medicine

## 2016-05-15 MED FILL — LOSARTAN-HCTZ 100-25 MG TAB: 100-25 | 30 days supply | Qty: 30 | Fill #2

## 2016-05-15 MED FILL — AMLODIPINE BESYLATE 10 MG T: 10 | 30 days supply | Qty: 30 | Fill #2

## 2016-06-13 MED FILL — LOSARTAN-HCTZ 100-25 MG TAB: 100-25 | 30 days supply | Qty: 30 | Fill #3

## 2016-06-13 MED FILL — LEVOTHYROXINE 112 MCG TAB: 112 | 30 days supply | Qty: 30 | Fill #1

## 2016-06-13 MED FILL — ?AMLODIPINE BESYLATE 10 MG: 10 | 30 days supply | Qty: 30 | Fill #3

## 2016-06-21 ENCOUNTER — Encounter: Payer: Self-pay | Admitting: Physical Medicine & Rehabilitation

## 2016-07-17 ENCOUNTER — Emergency Department (HOSPITAL_COMMUNITY)
Admission: EM | Admit: 2016-07-17 | Discharge: 2016-07-17 | Disposition: A | Discharge status: Home / Self Care | Payer: Self-pay | Attending: Emergency Medicine | Admitting: Emergency Medicine

## 2016-07-17 ENCOUNTER — Encounter (HOSPITAL_COMMUNITY): Payer: Self-pay

## 2016-07-17 DIAGNOSIS — M25572 Pain in left ankle and joints of left foot: Secondary | ICD-10-CM | POA: Insufficient documentation

## 2016-07-17 DIAGNOSIS — X509XXA Other and unspecified overexertion or strenuous movements or postures, initial encounter: Secondary | ICD-10-CM | POA: Insufficient documentation

## 2016-07-17 DIAGNOSIS — M25561 Pain in right knee: Secondary | ICD-10-CM | POA: Insufficient documentation

## 2016-07-17 DIAGNOSIS — Y929 Unspecified place or not applicable: Secondary | ICD-10-CM | POA: Insufficient documentation

## 2016-07-17 DIAGNOSIS — I10 Essential (primary) hypertension: Secondary | ICD-10-CM | POA: Insufficient documentation

## 2016-07-17 DIAGNOSIS — E039 Hypothyroidism, unspecified: Secondary | ICD-10-CM | POA: Insufficient documentation

## 2016-07-17 DIAGNOSIS — M25562 Pain in left knee: Secondary | ICD-10-CM | POA: Insufficient documentation

## 2016-07-17 DIAGNOSIS — M25551 Pain in right hip: Secondary | ICD-10-CM | POA: Insufficient documentation

## 2016-07-17 DIAGNOSIS — Y939 Activity, unspecified: Secondary | ICD-10-CM | POA: Insufficient documentation

## 2016-07-17 DIAGNOSIS — Y999 Unspecified external cause status: Secondary | ICD-10-CM | POA: Insufficient documentation

## 2016-07-17 DIAGNOSIS — F17210 Nicotine dependence, cigarettes, uncomplicated: Secondary | ICD-10-CM | POA: Insufficient documentation

## 2016-07-17 DIAGNOSIS — Z79899 Other long term (current) drug therapy: Secondary | ICD-10-CM | POA: Insufficient documentation

## 2016-07-17 MED ORDER — MELOXICAM 7.5 MG PO TABS: 15.0000 mg | ORAL_TABLET | Freq: Every day | ORAL | 0 refills | Status: DC

## 2016-07-17 MED ORDER — DEXAMETHASONE SODIUM PHOSPHATE 10 MG/ML IJ SOLN
10.0000 mg | Freq: Once | INTRAMUSCULAR | Status: AC
  Administered 2016-07-17: 10 mg via INTRAMUSCULAR
  Filled 2016-07-17: qty 1

## 2016-07-17 NOTE — Discharge Instructions (Signed)
Please take your medications as prescribed. Do not take your meloxicam with ibuprofen, Motrin or Advil, other NSAIDs as they were similarly. You may take Tylenol with your meloxicam. Please follow-up with your doctor for regularly scheduled appointment on the 23rd. Return to ED for new or worsening symptoms as we discussed.

## 2016-07-17 NOTE — ED Triage Notes (Signed)
Pt states she has bilateral hip pain with right knee and ankle pain. Pt denies injury. Reports using steps frequently at work. Pt states her PCP told her it could possibly be bursitis but pain has gotten worse. Pt ambulatory.

## 2016-07-17 NOTE — ED Provider Notes (Signed)
[facility]-EMERGENCY DEPT Provider Note   CSN: [id number] Arrival date & time: 07/17/16  1325  By signing my name below, I, Majel Homer, attest that this documentation has been prepared under the direction and in the presence of [employer], PA-C . Electronically Signed: Majel Homer, Scribe. 07/17/2016. 2:14 PM.  History   Chief Complaint Chief Complaint  Patient presents with  . Hip Pain  . Leg Pain   The history is provided by the patient. No language interpreter was used.   HPI Comments: Ann Jarvis is a 51 y.o. female with PMHx of Grave's Disease, HTN, and cocaine abuse, who presents to the Emergency Department complaining of gradually worsening, pain to her bilateral hips, right knee, and left ankle that began ~3 weeks ago. Pt reports she walks up multiple sets of stairs while at work which exacerbates her pain. She states hx of bursitis in her left hip and believes her current pain feels similar. She notes she has an appointment scheduled with her orthopedic specialist on 07/22/16. Pt reports she has taken Tramdol and applied topical arthritis cream with no relief of her pain. She denies hx of DM, kidney or abdominal complications, Fevers, chills, numbness or weakness and any other complaints.   Past Medical History:  Diagnosis Date  . Cocaine abuse   . Grave's disease    s/p radioiodine ablation 03/05/10, F/B Dr. Chestine Spore. On Methimazole  . Hypertension   . Hyperthyroidism   . Scabies   . Thyroid condition   . Tobacco abuse     Patient Active Problem List   Diagnosis Date Noted  . Hypothyroidism 11/13/2015  . Bell's palsy 10/09/2011  . DENTAL PAIN 08/01/2009  . PRURITUS 03/07/2009  . TOBACCO ABUSE 10/10/2008  . COCAINE ABUSE 08/17/2007  . Essential hypertension, benign 08/17/2007  . ABSCESS, TOOTH 08/17/2007  . CHEST PAIN UNSPECIFIED 08/17/2007  . SHOULDER PAIN, LEFT 07/27/2007  . GRAVE'S DISEASE 12/18/2006    Past Surgical History:  Procedure Laterality Date  . BTL      . MOUTH SURGERY      OB History    No data available     Home Medications    Prior to Admission medications   Medication Sig Start Date End Date Taking? Authorizing Provider  amLODipine (NORVASC) 10 MG tablet Take 1 tablet (10 mg total) by mouth daily. Must have office visit for refills 03/06/16   Pete Glatter, MD  cyclobenzaprine (FLEXERIL) 10 MG tablet Take 1 tablet (10 mg total) by mouth 3 (three) times daily as needed for muscle spasms. Patient not taking: Reported on 03/11/2016 03/06/16   Pete Glatter, MD  diclofenac sodium (VOLTAREN) 1 % GEL Apply 2 g topically 4 (four) times daily. 05/07/16   Pete Glatter, MD  fluticasone (FLONASE) 50 MCG/ACT nasal spray Place 2 sprays into both nostrils daily. Patient not taking: Reported on 03/11/2016 11/13/15   Pete Glatter, MD  HYDROcodone-acetaminophen (NORCO) 5-325 MG tablet Take 1-2 tablets by mouth every 6 (six) hours as needed. 03/11/16   Geoffery Lyons, MD  levothyroxine (SYNTHROID, LEVOTHROID) 112 MCG tablet Take 1 tablet (112 mcg total) by mouth daily before breakfast. 05/08/16   Pete Glatter, MD  losartan-hydrochlorothiazide (HYZAAR) 100-25 MG tablet Take 1 tablet by mouth daily. 03/06/16   Pete Glatter, MD  Melatonin 5 MG TABS Take 1 tablet (5 mg total) by mouth at bedtime. Patient not taking: Reported on 04/29/2016 03/06/16   Pete Glatter, MD  meloxicam (MOBIC) 7.5 MG  tablet Take 2 tablets (15 mg total) by mouth daily. 07/17/16   Joycie Peek, PA-C  methocarbamol (ROBAXIN) 500 MG tablet Take 1 tablet (500 mg total) by mouth 2 (two) times daily. 08/23/15   Tatyana Kirichenko, PA-C  polyethylene glycol powder (GLYCOLAX/MIRALAX) powder Take 17 g by mouth daily. Patient not taking: Reported on 04/29/2016 11/09/14   Ambrose Finland, NP   Family History History reviewed. No pertinent family history.  Social History Social History  Substance Use Topics  . Smoking status: Current Some Day Smoker    Packs/day: 0.25     Types: Cigarettes  . Smokeless tobacco: Never Used     Comment: 1 cig a day  . Alcohol use 0.6 - 1.2 oz/week    1 - 2 Cans of beer per week     Comment: occ   Allergies   Penicillins  Review of Systems Review of Systems  Constitutional: Negative for fever.  Musculoskeletal: Positive for arthralgias.   Physical Exam Updated Vital Signs BP (!) 145/87 (BP Location: Right Arm)   Pulse 64   Temp 98.2 F (36.8 C) (Oral)   Resp 18   LMP 10/07/2015   SpO2 100%   Physical Exam  Constitutional: She is oriented to person, place, and time. She appears well-developed and well-nourished. No distress.  Awake, alert and nontoxic in appearance. She is wearing tennis shoes that are untied and very loose and states this is what she wears every day.  HENT:  Head: Normocephalic and atraumatic.  Right Ear: External ear normal.  Left Ear: External ear normal.  Mouth/Throat: Oropharynx is clear and moist.  Eyes: Conjunctivae and EOM are normal. Pupils are equal, round, and reactive to light.  Neck: Normal range of motion. No JVD present.  Cardiovascular: Normal rate, regular rhythm and normal heart sounds.   Pulmonary/Chest: Effort normal and breath sounds normal. No stridor.  Abdominal: Soft. She exhibits no distension. There is no tenderness.  Musculoskeletal: Normal range of motion. She exhibits no edema or deformity.  TTP at distal quadriceps musculature on right knee. No other focal bony pain. No ligamentous laxity. No appreciable effusion. Maintains full active range of motion  Neurological: She is alert and oriented to person, place, and time.  Awake, alert, cooperative and aware of situation; motor strength baseline; no facial asymmetry; tongue midline; major cranial nerves appear intact;  baseline gait without new ataxia.  Skin: No rash noted. She is not diaphoretic.  Psychiatric: She has a normal mood and affect. Her behavior is normal. Thought content normal.  Nursing note and vitals  reviewed.  ED Treatments / Results  DIAGNOSTIC STUDIES:  Oxygen Saturation is 100% on RA, normal by my interpretation.   COORDINATION OF CARE:  2:12 PM Discussed treatment plan with pt at bedside and pt agreed to plan.  Labs (all labs ordered are listed, but only abnormal results are displayed) Labs Reviewed - No data to display  EKG  EKG Interpretation None       Radiology No results found.  Procedures Procedures (including critical care time)  Medications Ordered in ED Medications  dexamethasone (DECADRON) injection 10 mg (10 mg Intramuscular Given 07/17/16 1420)    Initial Impression / Assessment and Plan / ED Course  I have reviewed the triage vital signs and the nursing notes.  Pertinent labs & imaging results that were available during my care of the patient were reviewed by me and considered in my medical decision making (see chart for details).  Patient with joint and musculoskeletal pain. Exam was reassuring and maintains nonfocal neuro exam. Discussed continued supportive care at home , NSAIDs, Rice therapy. Given 10 mg IM Decadron. To follow-up with her PCP. Return precautions discussed.   I personally performed the services described in this documentation, which was scribed in my presence. The recorded information has been reviewed and is accurate.   Final Clinical Impressions(s) / ED Diagnoses   Final diagnoses:  Pain of right hip joint  Pain in both knees, unspecified chronicity  Left ankle pain, unspecified chronicity    New Prescriptions Discharge Medication List as of 07/17/2016  2:18 PM    START taking these medications   Details  meloxicam (MOBIC) 7.5 MG tablet Take 2 tablets (15 mg total) by mouth daily., Starting Wed 07/17/2016, Print         Joycie Peek, PA-C 07/17/16 1506    Canary Brim Tegeler, MD 07/17/16 1536

## 2016-07-18 MED FILL — MELOXICAM 7.5 MG TABLET: 7.5 | 15 days supply | Qty: 30 | Fill #0

## 2016-07-22 ENCOUNTER — Ambulatory Visit (HOSPITAL_BASED_OUTPATIENT_CLINIC_OR_DEPARTMENT_OTHER): Payer: Self-pay | Admitting: Physical Medicine & Rehabilitation

## 2016-07-22 ENCOUNTER — Encounter: Payer: Self-pay | Attending: Physical Medicine & Rehabilitation

## 2016-07-22 ENCOUNTER — Encounter: Payer: Self-pay | Admitting: Physical Medicine & Rehabilitation

## 2016-07-22 VITALS — BP 161/90 | HR 65

## 2016-07-22 DIAGNOSIS — M25572 Pain in left ankle and joints of left foot: Secondary | ICD-10-CM | POA: Insufficient documentation

## 2016-07-22 DIAGNOSIS — M25552 Pain in left hip: Secondary | ICD-10-CM | POA: Insufficient documentation

## 2016-07-22 DIAGNOSIS — Z720 Tobacco use: Secondary | ICD-10-CM | POA: Insufficient documentation

## 2016-07-22 DIAGNOSIS — M6798 Unspecified disorder of synovium and tendon, other site: Secondary | ICD-10-CM

## 2016-07-22 DIAGNOSIS — S93402A Sprain of unspecified ligament of left ankle, initial encounter: Secondary | ICD-10-CM | POA: Insufficient documentation

## 2016-07-22 DIAGNOSIS — F1410 Cocaine abuse, uncomplicated: Secondary | ICD-10-CM | POA: Insufficient documentation

## 2016-07-22 DIAGNOSIS — M6289 Other specified disorders of muscle: Secondary | ICD-10-CM

## 2016-07-22 DIAGNOSIS — M67951 Unspecified disorder of synovium and tendon, right thigh: Secondary | ICD-10-CM

## 2016-07-22 DIAGNOSIS — E0500 Thyrotoxicosis with diffuse goiter without thyrotoxic crisis or storm: Secondary | ICD-10-CM | POA: Insufficient documentation

## 2016-07-22 DIAGNOSIS — M25551 Pain in right hip: Secondary | ICD-10-CM | POA: Insufficient documentation

## 2016-07-22 DIAGNOSIS — I10 Essential (primary) hypertension: Secondary | ICD-10-CM | POA: Insufficient documentation

## 2016-07-22 DIAGNOSIS — M25561 Pain in right knee: Secondary | ICD-10-CM | POA: Insufficient documentation

## 2016-07-22 MED ORDER — DICLOFENAC SODIUM 1 % TD GEL: 2.0000 g | Freq: Four times a day (QID) | TRANSDERMAL | 2 refills | Status: DC

## 2016-07-22 NOTE — Progress Notes (Signed)
Subjective:    Patient ID: Ann Jarvis, female    DOB: 05-23-1965, 51 y.o.   MRN: [id number]  HPI CC:  Right Knee and Bilateral buttocks 8-60months, 51mo hx of Left ankle soreness  No falls or other injuries.  No history R Hip, knee or Left ankle surgery Pt feels like pain is increasing with time. Works as a Advertising copywriter. Pain increases with stair climbing  Denies fever or chills, no weight loss  Reviewed. CT of the abdomen and pelvis dated 08/27/2013. No evidence of lumbar spinal stenosis, hip joints showed no significant arthritis. Right knee x-ray was negative except for joint effusion, left hip, negative as listed below  Past medical history significant for cocaine abuse Pain Inventory Average Pain 8 Pain Right Now 7 My pain is aching  In the last 24 hours, has pain interfered with the following? General activity 10 Relation with others 10 Enjoyment of life 10 What TIME of day is your pain at its worst? daytime Sleep (in general) Fair  Pain is worse with: walking, bending, standing and some activites Pain improves with: heat/ice Relief from Meds: 2  Mobility ability to climb steps?  no do you drive?  no  Function employed # of hrs/week 5  Neuro/Psych No problems in this area  Prior Studies Any changes since last visit?  no CLINICAL DATA:  Left hip pain for 1 month, worsening pain today, no known injury  EXAM: DG HIP (WITH OR WITHOUT PELVIS) 2-3V LEFT  COMPARISON:  None.  FINDINGS: Three views of the left hip submitted. No acute fracture or subluxation. Bilateral hip joints are symmetrical in appearance. Pelvic phleboliths are noted.  IMPRESSION: Negative.   Electronically Signed   By: Natasha Mead M.D.   On: 08/23/2015 14:31  CLINICAL DATA:  Chronic right knee pain. Anterior knee swelling. Initial encounter.  EXAM: RIGHT KNEE - 1-2 VIEW  COMPARISON:  None.  FINDINGS: There is no evidence of fracture or dislocation. The joint  spaces are preserved. No significant degenerative change is seen; the patellofemoral joint is grossly unremarkable in appearance.  A moderate knee joint effusion is noted. The visualized soft tissues are otherwise unremarkable in appearance.  IMPRESSION: 1. No evidence of fracture or dislocation. 2. Moderate knee joint effusion noted.   Electronically Signed   By: Roanna Raider M.D. Physicians involved in your care Any changes since last visit?  no   No family history on file. Social History   Social History  . Marital status: Single    Spouse name: N/A  . Number of children: N/A  . Years of education: N/A   Social History Main Topics  . Smoking status: Current Some Day Smoker    Packs/day: 0.25    Types: Cigarettes  . Smokeless tobacco: Never Used     Comment: 1 cig a day  . Alcohol use 0.6 - 1.2 oz/week    1 - 2 Cans of beer per week     Comment: occ  . Drug use: Yes    Types: Marijuana     Comment: marijuana  . Sexual activity: Not on file   Other Topics Concern  . Not on file   Social History Narrative   Smokes 1 pack/week x 20 years, smokes marijuna, no cocaine in the last year.  Works for [employer].      Financial assistance approved for 100% discount at [facility] and has [facility] card Rudell Cobb Aug 01 2009 8.58am   Financial assistance approved  for 100% discount at [facility] and has [facility] card [facility] February 02, 2010 12.20pm   Past Surgical History:  Procedure Laterality Date  . BTL    . MOUTH SURGERY     Past Medical History:  Diagnosis Date  . Cocaine abuse   . Grave's disease    s/p radioiodine ablation 03/05/10, F/B Dr. Chestine Spore. On Methimazole  . Hypertension   . Hyperthyroidism   . Scabies   . Thyroid condition   . Tobacco abuse    LMP 10/07/2015   Opioid Risk Score:   Fall Risk Score:  `1  Depression screen PHQ 2/9  Depression screen [facility] 2/9 05/07/2016 03/06/2016 01/29/2016 01/08/2016 12/11/2015 11/13/2015 11/09/2014  Decreased Interest  0 0 0 0 0 0 0  Down, Depressed, Hopeless 0 0 0 0 0 0 0  PHQ - 2 Score 0 0 0 0 0 0 0  Altered sleeping - 2 1 - - - -  Tired, decreased energy - 0 1 - - - -  Change in appetite - 0 3 - - - -  Feeling bad or failure about yourself  - 0 0 - - - -  Trouble concentrating - 0 0 - - - -  Moving slowly or fidgety/restless - 0 0 - - - -  Suicidal thoughts - 0 0 - - - -  PHQ-9 Score - 2 5 - - - -    Review of Systems  Constitutional: Negative.   HENT: Negative.   Eyes: Negative.   Respiratory: Negative.   Cardiovascular: Negative.   Gastrointestinal: Negative.   Endocrine: Negative.   Genitourinary: Negative.   Musculoskeletal: Positive for joint swelling.  Skin: Negative.   Allergic/Immunologic: Negative.   Neurological: Negative.   Hematological: Negative.   Psychiatric/Behavioral: Negative.        Objective:   Physical Exam  Constitutional: She is oriented to person, place, and time. She appears well-developed and well-nourished.  HENT:  Head: Normocephalic and atraumatic.  Eyes: Conjunctivae are normal. Pupils are equal, round, and reactive to light.  Cardiovascular: Normal rate, regular rhythm and normal heart sounds.  Exam reveals no friction rub.   No murmur heard. Pulmonary/Chest: Effort normal and breath sounds normal. No respiratory distress. She has no wheezes.  Abdominal: Soft. Bowel sounds are normal. She exhibits no distension. There is no tenderness.  Musculoskeletal:  Tenderness to palpation over the insertion of the tensor fascia lata on the right lateral knee. It is also tenderness along the iliotibial band and at the hip anterolaterally. Mild tenderness over bilateral sacroiliac area. There is also tenderness at the right gluteus medius area The right knee has some thickening of the prepatellar bursa, mild pain over the patellar tendon. No pain with knee range of motion. No evidence of joint erythema or effusion. There is no pain with knee range of motion, right  knee range of motion is full. Bilateral hip range of motion is normal. No pain with hip range of motion. No pain with weightbearing. Ambulates without evidence to drag or knee instability. There is no tenderness to palpation over the cervical, thoracic or lumbar paraspinal areas. Left ankle has no evidence of effusion. No evidence of soft tissue swelling. His there is mild to moderate tenderness over anterior talofibular ligament as well as pain with passive inversion of the ankle.  Neurological: She is alert and oriented to person, place, and time.  Skin: Skin is warm and dry.  Psychiatric: Her mood appears anxious. Her speech is rapid and/or  pressured and tangential. She is hyperactive. She is inattentive.  Nursing note and vitals reviewed.         Assessment & Plan:  1. 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 In addition, she may have a chronic prepatellar bursitis. Given that this is mainly soft tissue injury. Will ask physical therapy to work with her. In addition, she may use Voltaren gel and have written another prescription. Given history of cocaine abuse, would avoid narcotic analgesics, which are not even indicated with soft tissue injury.    2. Bilateral hip pain. Patient points mainly to the gluteus, medius area on the right side. Ask therapy to address.   3. Left ankle pain due to sprain/inversion injury, patient does not recall turning her ankle. Recommend ankle splint. She may purchase this at a pharmacy. This can be worn to work. I recommend she not return to work until she gets one of these. Ask PT to perform ankle stabilizer strengthening, proprioceptive training, injury prevention

## 2016-07-22 NOTE — Patient Instructions (Signed)
Left ankle sprain need a left ankle brace may go to a pharmacy for this, do not need a prescription for brace  Right knee Tensor fascia lata syndrome - therapy

## 2016-07-29 MED FILL — LEVOTHYROXINE 112 MCG TAB: 112 | 30 days supply | Qty: 30 | Fill #2

## 2016-07-31 MED FILL — LOSARTAN-HCTZ 100-25 MG TAB: 100-25 | 30 days supply | Qty: 30 | Fill #4

## 2016-08-15 ENCOUNTER — Encounter: Payer: Self-pay | Admitting: Internal Medicine

## 2016-08-16 ENCOUNTER — Encounter: Payer: Self-pay | Admitting: Internal Medicine

## 2016-08-19 ENCOUNTER — Encounter: Payer: Self-pay | Admitting: Internal Medicine

## 2016-09-02 ENCOUNTER — Encounter: Payer: Self-pay | Attending: Physical Medicine & Rehabilitation

## 2016-09-02 ENCOUNTER — Ambulatory Visit: Payer: Self-pay | Admitting: Internal Medicine

## 2016-09-02 ENCOUNTER — Ambulatory Visit: Payer: Self-pay | Admitting: Physical Medicine & Rehabilitation

## 2016-09-02 DIAGNOSIS — M25561 Pain in right knee: Secondary | ICD-10-CM | POA: Insufficient documentation

## 2016-09-02 DIAGNOSIS — Z720 Tobacco use: Secondary | ICD-10-CM | POA: Insufficient documentation

## 2016-09-02 DIAGNOSIS — F1410 Cocaine abuse, uncomplicated: Secondary | ICD-10-CM | POA: Insufficient documentation

## 2016-09-02 DIAGNOSIS — E0500 Thyrotoxicosis with diffuse goiter without thyrotoxic crisis or storm: Secondary | ICD-10-CM | POA: Insufficient documentation

## 2016-09-02 DIAGNOSIS — M25552 Pain in left hip: Secondary | ICD-10-CM | POA: Insufficient documentation

## 2016-09-02 DIAGNOSIS — S93402A Sprain of unspecified ligament of left ankle, initial encounter: Secondary | ICD-10-CM | POA: Insufficient documentation

## 2016-09-02 DIAGNOSIS — M25572 Pain in left ankle and joints of left foot: Secondary | ICD-10-CM | POA: Insufficient documentation

## 2016-09-02 DIAGNOSIS — M25551 Pain in right hip: Secondary | ICD-10-CM | POA: Insufficient documentation

## 2016-09-02 DIAGNOSIS — I10 Essential (primary) hypertension: Secondary | ICD-10-CM | POA: Insufficient documentation

## 2016-09-13 ENCOUNTER — Encounter: Payer: Self-pay | Admitting: Internal Medicine

## 2016-09-13 ENCOUNTER — Ambulatory Visit: Payer: Self-pay | Attending: Internal Medicine | Admitting: Internal Medicine

## 2016-09-13 VITALS — BP 186/91 | HR 64 | Temp 97.6°F | Resp 17 | Ht 66.0 in | Wt 193.0 lb

## 2016-09-13 DIAGNOSIS — F17200 Nicotine dependence, unspecified, uncomplicated: Secondary | ICD-10-CM

## 2016-09-13 DIAGNOSIS — Z880 Allergy status to penicillin: Secondary | ICD-10-CM | POA: Insufficient documentation

## 2016-09-13 DIAGNOSIS — I10 Essential (primary) hypertension: Secondary | ICD-10-CM

## 2016-09-13 DIAGNOSIS — N183 Chronic kidney disease, stage 3 (moderate): Secondary | ICD-10-CM | POA: Insufficient documentation

## 2016-09-13 DIAGNOSIS — G510 Bell's palsy: Secondary | ICD-10-CM | POA: Insufficient documentation

## 2016-09-13 DIAGNOSIS — M25561 Pain in right knee: Secondary | ICD-10-CM

## 2016-09-13 DIAGNOSIS — E0500 Thyrotoxicosis with diffuse goiter without thyrotoxic crisis or storm: Secondary | ICD-10-CM | POA: Insufficient documentation

## 2016-09-13 DIAGNOSIS — I129 Hypertensive chronic kidney disease with stage 1 through stage 4 chronic kidney disease, or unspecified chronic kidney disease: Secondary | ICD-10-CM | POA: Insufficient documentation

## 2016-09-13 DIAGNOSIS — S93402A Sprain of unspecified ligament of left ankle, initial encounter: Secondary | ICD-10-CM | POA: Insufficient documentation

## 2016-09-13 DIAGNOSIS — R079 Chest pain, unspecified: Secondary | ICD-10-CM | POA: Insufficient documentation

## 2016-09-13 DIAGNOSIS — E890 Postprocedural hypothyroidism: Secondary | ICD-10-CM | POA: Insufficient documentation

## 2016-09-13 DIAGNOSIS — Z79899 Other long term (current) drug therapy: Secondary | ICD-10-CM | POA: Insufficient documentation

## 2016-09-13 DIAGNOSIS — F1410 Cocaine abuse, uncomplicated: Secondary | ICD-10-CM | POA: Insufficient documentation

## 2016-09-13 DIAGNOSIS — F17210 Nicotine dependence, cigarettes, uncomplicated: Secondary | ICD-10-CM | POA: Insufficient documentation

## 2016-09-13 DIAGNOSIS — G8929 Other chronic pain: Secondary | ICD-10-CM | POA: Insufficient documentation

## 2016-09-13 MED ORDER — LEVOTHYROXINE SODIUM 112 MCG PO TABS: 112.0000 ug | ORAL_TABLET | Freq: Every day | ORAL | 3 refills | Status: DC

## 2016-09-13 MED ORDER — MELOXICAM 7.5 MG PO TABS: 15.0000 mg | ORAL_TABLET | Freq: Every day | ORAL | 2 refills | Status: DC

## 2016-09-13 MED ORDER — AMLODIPINE BESYLATE 10 MG PO TABS: 10.0000 mg | ORAL_TABLET | Freq: Every day | ORAL | 2 refills | Status: DC

## 2016-09-13 MED FILL — AMLODIPINE BESYLATE 10 MG T: 10 | 30 days supply | Qty: 30 | Fill #0

## 2016-09-13 MED FILL — LEVOTHYROXINE 112 MCG TAB: 112 | 30 days supply | Qty: 30 | Fill #0

## 2016-09-13 MED FILL — MELOXICAM 7.5 MG TABLET: 7.5 | 15 days supply | Qty: 30 | Fill #0

## 2016-09-13 NOTE — Progress Notes (Signed)
Patient ID: Ann Jarvis, female    DOB: 1966-02-16  MRN: [id number]  CC: Hypertension and Edema (Lt ankle, rt knee)   Subjective: Ann Jarvis is a 51 y.o. female who presents for f/u Her concerns today include:  Hx HTN, Tob,  Hypothyroid post treatment for Graves' disease, cocaine abuse, CKD stage 3  1. HTN -out of Amlodipine x 1 wk due to limited finances. She plans to get filled today if the pharmacy is willing to credit her until next week. -limits salt No CP/SOB/HA  2.  Pain, swelling and stiffness in RT knee.  Same in LT ankle -intermittent.  But this week it has been hurting -has splint.  Still hurts when she is on her feet.  -"Taking any and everything" - Goody Powders, Tylenol, Aleve Mobic worked better -Seen by P MR in April for the same. Voltaren gel prescribed and patient referred for physical therapy. She has not started therapy as yet. She does not like the Voltaren gel it does not work for her.  3. Hypothyroid -Out of medication for a few weeks due to limited finances  4. Tobacco 4 a day. -smoked for 30 yrs.  Stopped in past but always relapse She is not wanting to quit at this time  Patient Active Problem List   Diagnosis Date Noted  . 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 Sprain of left ankle 07/22/2016  . Tendinopathy of right gluteus medius 07/22/2016  . Hypothyroidism 11/13/2015  . Bell's palsy 10/09/2011  . DENTAL PAIN 08/01/2009  . PRURITUS 03/07/2009  . TOBACCO ABUSE 10/10/2008  . COCAINE ABUSE 08/17/2007  . Essential hypertension, benign 08/17/2007  . ABSCESS, TOOTH 08/17/2007  . CHEST PAIN UNSPECIFIED 08/17/2007  . SHOULDER PAIN, LEFT 07/27/2007  . GRAVE'S DISEASE 12/18/2006     Current Outpatient Prescriptions on File Prior to Visit  Medication Sig Dispense Refill  . losartan-hydrochlorothiazide (HYZAAR) 100-25 MG tablet Take 1 tablet by mouth daily. 90 tablet 3  . diclofenac sodium (VOLTAREN) 1 % GEL Apply 2 g topically 4  (four) times daily. (Patient not taking: Reported on 09/13/2016) 3 Tube 2  . Melatonin 5 MG TABS Take 1 tablet (5 mg total) by mouth at bedtime. (Patient not taking: Reported on 09/13/2016) 90 tablet 0  . methocarbamol (ROBAXIN) 500 MG tablet Take 1 tablet (500 mg total) by mouth 2 (two) times daily. (Patient not taking: Reported on 09/13/2016) 20 tablet 0   No current facility-administered medications on file prior to visit.     Allergies  Allergen Reactions  . Penicillins Swelling    Social History   Social History  . Marital status: Single    Spouse name: N/A  . Number of children: N/A  . Years of education: N/A   Occupational History  . Not on file.   Social History Main Topics  . Smoking status: Current Some Day Smoker    Packs/day: 0.25    Types: Cigarettes  . Smokeless tobacco: Never Used     Comment: 1 cig a day  . Alcohol use 0.6 - 1.2 oz/week    1 - 2 Cans of beer per week     Comment: occ  . Drug use: Yes    Types: Marijuana     Comment: marijuana  . Sexual activity: Not on file   Other Topics Concern  . Not on file   Social History Narrative   Smokes 1 pack/week x 20 years, smokes marijuna, no cocaine in the last year.  Works for [employer].      Financial assistance approved for 100% discount at [facility] and has [facility] card Rudell Cobb Aug 01 2009 8.58am   Financial assistance approved for 100% discount at [facility] and has [facility] card Gavin Pound February 02, 2010 12.20pm    No family history on file.  Past Surgical History:  Procedure Laterality Date  . BTL    . MOUTH SURGERY      ROS: Review of Systems as stated above PHYSICAL EXAM: BP (!) 186/91 (BP Location: Right Arm, Patient Position: Sitting, Cuff Size: Normal)   Pulse 64   Temp 97.6 F (36.4 C) (Oral)   Resp 17   Ht 5\' 6"  (1.676 m)   Wt 193 lb (87.5 kg)   LMP 10/07/2015   SpO2 100%   BMI 31.15 kg/m   Physical Exam  General appearance - alert, well appearing, and in no distress.   Mental  status - alert, oriented to person, place, and time, normal mood, behavior, speech, dress, motor activity, and thought processes Neck - supple, no significant adenopathy Chest - clear to auscultation, no wheezes, rales or rhonchi, symmetric air entry Heart - normal rate, regular rhythm, normal S1, S2, no murmurs, rubs, clicks or gallops Musculoskeletal -right knee: Mild edema. No point tenderness. She has pretty good range of motion. Extremities - no lower extremity edema.   ASSESSMENT AND PLAN: 1. Essential hypertension Not at goal. Patient will speak with pharmacy today to see if she can get the Norvasc DASH diet discussed - amLODipine (NORVASC) 10 MG tablet; Take 1 tablet (10 mg total) by mouth daily. Must have office visit for refills  Dispense: 90 tablet; Refill: 2  2. Chronic pain of right knee -Await appointment with physical therapy. -Patient told to stop the over-the-counter medications including BCs and Advil. We will need to check her creatinine on follow-up visit - meloxicam (MOBIC) 7.5 MG tablet; Take 2 tablets (15 mg total) by mouth daily.  Dispense: 30 tablet; Refill: 2  3. Postoperative hypothyroidism - levothyroxine (SYNTHROID, LEVOTHROID) 112 MCG tablet; Take 1 tablet (112 mcg total) by mouth daily before breakfast.  Dispense: 30 tablet; Refill: 3  4. Tobacco use disorder -Advised to quit. Discussed health risks associated with cigarette smoking. Patient not ready to give a trial of quitting.  Patient was given the opportunity to ask questions.  Patient verbalized understanding of the plan and was able to repeat key elements of the plan.   No orders of the defined types were placed in this encounter.    Requested Prescriptions   Signed Prescriptions Disp Refills  . levothyroxine (SYNTHROID, LEVOTHROID) 112 MCG tablet 30 tablet 3    Sig: Take 1 tablet (112 mcg total) by mouth daily before breakfast.  . amLODipine (NORVASC) 10 MG tablet 90 tablet 2    Sig: Take 1  tablet (10 mg total) by mouth daily. Must have office visit for refills  . meloxicam (MOBIC) 7.5 MG tablet 30 tablet 2    Sig: Take 2 tablets (15 mg total) by mouth daily.    Return in about 2 months (around 11/13/2016).  Jonah Blue, MD, FACP

## 2016-09-13 NOTE — Patient Instructions (Addendum)
Please give appointment with Stacy in 2 weeks for repeat blood pressure check.  Stop BC Powder and Advil.    Stop at pharmacy today to get your refills on Norvasc and thyroid medication.

## 2016-09-25 ENCOUNTER — Ambulatory Visit: Payer: Self-pay | Attending: Internal Medicine

## 2016-09-26 MED FILL — LOSARTAN-HCTZ 100-25 MG TAB: 100-25 | 30 days supply | Qty: 30 | Fill #5

## 2016-09-26 MED FILL — MELOXICAM 7.5 MG TABLET: 7.5 | 15 days supply | Qty: 30 | Fill #1

## 2016-09-30 ENCOUNTER — Ambulatory Visit: Payer: Self-pay

## 2016-10-03 ENCOUNTER — Ambulatory Visit: Payer: Self-pay | Attending: Internal Medicine

## 2016-10-15 MED FILL — ?LEVOTHYROXINE 112 MCG TAB: 112 | 30 days supply | Qty: 30 | Fill #1

## 2016-10-15 MED FILL — ?AMLODIPINE BESYLATE 10 MG: 10 | 30 days supply | Qty: 30 | Fill #1

## 2016-11-06 ENCOUNTER — Other Ambulatory Visit: Payer: Self-pay | Admitting: Internal Medicine

## 2016-11-06 DIAGNOSIS — M25561 Pain in right knee: Principal | ICD-10-CM

## 2016-11-06 DIAGNOSIS — G8929 Other chronic pain: Secondary | ICD-10-CM

## 2016-11-06 MED FILL — MELOXICAM 7.5 MG TABLET: 7.5 | 15 days supply | Qty: 30 | Fill #2

## 2016-11-06 MED FILL — ?LEVOTHYROXINE 112 MCG TAB: 112 | 30 days supply | Qty: 30 | Fill #2

## 2016-11-06 MED FILL — LOSARTAN-HCTZ 100-25 MG TAB: 100-25 | 30 days supply | Qty: 30 | Fill #6

## 2016-11-06 MED FILL — AMLODIPINE BESYLATE 10 MG T: 10 | 30 days supply | Qty: 30 | Fill #2

## 2016-11-26 ENCOUNTER — Ambulatory Visit: Payer: Self-pay | Admitting: Internal Medicine

## 2016-12-06 ENCOUNTER — Ambulatory Visit: Payer: Self-pay | Admitting: Internal Medicine

## 2016-12-12 ENCOUNTER — Encounter: Payer: Self-pay | Admitting: Internal Medicine

## 2016-12-12 ENCOUNTER — Ambulatory Visit: Payer: Self-pay | Attending: Internal Medicine | Admitting: Internal Medicine

## 2016-12-12 VITALS — BP 132/70 | HR 53 | Temp 98.2°F | Resp 16 | Wt 190.8 lb

## 2016-12-12 DIAGNOSIS — G510 Bell's palsy: Secondary | ICD-10-CM | POA: Insufficient documentation

## 2016-12-12 DIAGNOSIS — F17210 Nicotine dependence, cigarettes, uncomplicated: Secondary | ICD-10-CM | POA: Insufficient documentation

## 2016-12-12 DIAGNOSIS — Z2821 Immunization not carried out because of patient refusal: Secondary | ICD-10-CM

## 2016-12-12 DIAGNOSIS — Z1211 Encounter for screening for malignant neoplasm of colon: Secondary | ICD-10-CM

## 2016-12-12 DIAGNOSIS — N183 Chronic kidney disease, stage 3 (moderate): Secondary | ICD-10-CM | POA: Insufficient documentation

## 2016-12-12 DIAGNOSIS — M7989 Other specified soft tissue disorders: Secondary | ICD-10-CM | POA: Insufficient documentation

## 2016-12-12 DIAGNOSIS — M25561 Pain in right knee: Secondary | ICD-10-CM | POA: Insufficient documentation

## 2016-12-12 DIAGNOSIS — E0500 Thyrotoxicosis with diffuse goiter without thyrotoxic crisis or storm: Secondary | ICD-10-CM | POA: Insufficient documentation

## 2016-12-12 DIAGNOSIS — N951 Menopausal and female climacteric states: Secondary | ICD-10-CM

## 2016-12-12 DIAGNOSIS — F17200 Nicotine dependence, unspecified, uncomplicated: Secondary | ICD-10-CM

## 2016-12-12 DIAGNOSIS — I129 Hypertensive chronic kidney disease with stage 1 through stage 4 chronic kidney disease, or unspecified chronic kidney disease: Secondary | ICD-10-CM | POA: Insufficient documentation

## 2016-12-12 DIAGNOSIS — Z1239 Encounter for other screening for malignant neoplasm of breast: Secondary | ICD-10-CM

## 2016-12-12 DIAGNOSIS — G8929 Other chronic pain: Secondary | ICD-10-CM | POA: Insufficient documentation

## 2016-12-12 DIAGNOSIS — E038 Other specified hypothyroidism: Secondary | ICD-10-CM | POA: Insufficient documentation

## 2016-12-12 DIAGNOSIS — M25572 Pain in left ankle and joints of left foot: Secondary | ICD-10-CM | POA: Insufficient documentation

## 2016-12-12 DIAGNOSIS — E890 Postprocedural hypothyroidism: Secondary | ICD-10-CM

## 2016-12-12 DIAGNOSIS — I10 Essential (primary) hypertension: Secondary | ICD-10-CM | POA: Insufficient documentation

## 2016-12-12 DIAGNOSIS — F1410 Cocaine abuse, uncomplicated: Secondary | ICD-10-CM | POA: Insufficient documentation

## 2016-12-12 DIAGNOSIS — Z1231 Encounter for screening mammogram for malignant neoplasm of breast: Secondary | ICD-10-CM

## 2016-12-12 MED ORDER — GABAPENTIN 300 MG PO CAPS: 300.0000 mg | ORAL_CAPSULE | Freq: Every day | ORAL | 3 refills | Status: DC

## 2016-12-12 MED FILL — GABAPENTIN 300 MG CAPSULE: 300 | 30 days supply | Qty: 30 | Fill #0

## 2016-12-12 NOTE — Progress Notes (Signed)
Patient ID: Ann Jarvis, female    DOB: 1966-01-29  MRN: [id number]  CC: Hypertension   Subjective: Ann Jarvis is a 51 y.o. female who presents for chronic ds management.  Last seen 08/2016. Her concerns today include:  Hx HTN, Tob,  Hypothyroid post treatment for Graves' disease, cocaine abuse, CKD stage 3 (last GFR improved to 66 in 05/2016).  1. RT knee pain: -this is her main compliant -Meloxicam helps only a little -hurts when she lays in bed, with quick movements, when going up and down steps, and after walking more than 3 blocks -has [employer] LT ankle still swells. She does not wear the brace recommended by PMR.  "Makes my ankle more sore."  2. HTN: -compliant with meds No CP/SOB/HA -tries to limit salt  But "I like a lot of hot sauce and I had sausage this morning."   3. C/o frequent hot flashes during the day.  Tried Black Cohesh in past without good results -no periods in about 6 mths then this month she had light menses lasting 5 days.  -taking Levothyroxine consistently.  No tremors; +15 lbs wgh loss since 05/2016  4. Tob: not interested  In quitting.  HM: due for MMG; ordered last yr December but never called. Does not want flu shot. Okay for colonoscopy.  Declines HIV/Hep C screening. States she had done already.  Patient Active Problem List   Diagnosis Date Noted  . 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 Hypothyroidism 11/13/2015  . Bell's palsy 10/09/2011  . TOBACCO ABUSE 10/10/2008  . COCAINE ABUSE 08/17/2007  . Essential hypertension 08/17/2007     Current Outpatient Prescriptions on File Prior to Visit  Medication Sig Dispense Refill  . amLODipine (NORVASC) 10 MG tablet Take 1 tablet (10 mg total) by mouth daily. Must have office visit for refills 90 tablet 2  . levothyroxine (SYNTHROID, LEVOTHROID) 112 MCG tablet Take 1 tablet (112 mcg total) by mouth daily before breakfast. 30 tablet 3  . losartan-hydrochlorothiazide (HYZAAR) 100-25  MG tablet Take 1 tablet by mouth daily. 90 tablet 3  . meloxicam (MOBIC) 7.5 MG tablet Take 2 tablets (15 mg total) by mouth daily. 30 tablet 2   No current facility-administered medications on file prior to visit.     Allergies  Allergen Reactions  . Penicillins Swelling    Social History   Social History  . Marital status: Single    Spouse name: N/A  . Number of children: N/A  . Years of education: N/A   Occupational History  . Not on file.   Social History Main Topics  . Smoking status: Current Some Day Smoker    Packs/day: 0.25    Types: Cigarettes  . Smokeless tobacco: Never Used     Comment: 1 cig a day  . Alcohol use 0.6 - 1.2 oz/week    1 - 2 Cans of beer per week     Comment: occ  . Drug use: Yes    Types: Marijuana     Comment: marijuana  . Sexual activity: Not on file   Other Topics Concern  . Not on file   Social History Narrative   Smokes 1 pack/week x 20 years, smokes marijuna, no cocaine in the last year.  Works for [employer].      Financial assistance approved for 100% discount at [facility] and has [facility] card Ann Jarvis Aug 01 2009 8.58am   Financial assistance approved for 100% discount at [facility] and  [facility] Ann Jarvis February 02, 2010 12.20pm    No family history on file.  Past Surgical History:  Procedure Laterality Date  . BTL    . MOUTH SURGERY      ROS: Review of Systems Neg except as stated above.  PHYSICAL EXAM: BP 132/70   Pulse (!) 53   Temp 98.2 F (36.8 C) (Oral)   Resp 16   Wt 190 lb 12.8 oz (86.5 kg)   LMP 10/07/2015   SpO2 96%   BMI 30.80 kg/m   Wt Readings from Last 3 Encounters:  12/12/16 190 lb 12.8 oz (86.5 kg)  09/13/16 193 lb (87.5 kg)  05/07/16 205 lb 6.4 oz (93.2 kg)    Physical Exam General appearance - alert, well appearing, and in no distress.   Mental status - alert, oriented to person, place, and time, normal mood, behavior, speech, dress, motor activity, and thought processes Neck -  supple, no significant adenopathy Chest - clear to auscultation, no wheezes, rales or rhonchi, symmetric air entry Heart - normal rate, regular rhythm, normal S1, S2, no murmurs, rubs, clicks or gallops Musculoskeletal -right knee: Mild edema. No point tenderness. Good ROM Lt ankle: no edema. Mild tenderness anterior to lateral malleolus. Extremities - no lower extremity edema.     ASSESSMENT AND PLAN: 1. Chronic pain of right knee -Saw Dr. Wynn BankerKirsteins in4/2018 and dx with possible tensor fascia lata syndrome and chronic prepatellar bursitis. May  benefit from steroid injection  -continue Mobic - Ambulatory referral to Orthopedic Surgery  2. Chronic pain of left ankle - gabapentin (NEURONTIN) 300 MG capsule; Take 1 capsule (300 mg total) by mouth at bedtime.  Dispense: 30 capsule; Refill: 3  3. Essential hypertension -at goal - Lipid panel - Comprehensive metabolic panel - CBC - Hemoglobin A1c  4. Perimenopausal symptoms -discussed what it means to be perimenopausal and associated signs/symptoms.  -Not a good candidate for HRT given CV risk factors.  Discussed other medications that can be used including SSRIs or low dose of gabapentin. We decided on the latter which will also help with chronic ankle pain - gabapentin (NEURONTIN) 300 MG capsule; Take 1 capsule (300 mg total) by mouth at bedtime.  Dispense: 30 capsule; Refill: 3  5. Postoperative hypothyroidism -given wgh loss, will check TSH and other baseline labs.  Declines HIV/hep C screening - TSH - 6. Breast cancer screening - MM Digital Screening; Future  7. Colon cancer screening - Ambulatory referral to Gastroenterology  8. Influenza vaccination declined  9. Tobacco use disorder Patient advised to quit smoking. Discussed health risks associated with smoking including lung and other types of cancers, chronic lung diseases and CV risks.. Pt not ready to give trail of quitting.    Patient was given the opportunity to  ask questions.  Patient verbalized understanding of the plan and was able to repeat key elements of the plan.   Orders Placed This Encounter  Procedures  . MM Digital Screening  . TSH  . Lipid panel  . Comprehensive metabolic panel  . CBC  . Hemoglobin A1c  . Ambulatory referral to Gastroenterology  . Ambulatory referral to Orthopedic Surgery     Requested Prescriptions   Signed Prescriptions Disp Refills  . gabapentin (NEURONTIN) 300 MG capsule 30 capsule 3    Sig: Take 1 capsule (300 mg total) by mouth at bedtime.    Return in about 3 months (around 03/13/2017).  Jonah Blueeborah Johnson, MD, FACP

## 2016-12-12 NOTE — Patient Instructions (Signed)
Start Gabapentin 300 mg at bedtime to help with chronic pain in your ankle and hot flashes.   Perimenopause Perimenopause is the time when your body begins to move into the menopause (no menstrual period for 12 straight months). It is a natural process. Perimenopause can begin 2-8 years before the menopause and usually lasts for 1 year after the menopause. During this time, your ovaries may or may not produce an egg. The ovaries vary in their production of estrogen and progesterone hormones each month. This can cause irregular menstrual periods, difficulty getting pregnant, vaginal bleeding between periods, and uncomfortable symptoms. What are the causes?  Irregular production of the ovarian hormones, estrogen and progesterone, and not ovulating every month. Other causes include:  Tumor of the pituitary gland in the brain.  Medical disease that affects the ovaries.  Radiation treatment.  Chemotherapy.  Unknown causes.  Heavy smoking and excessive alcohol intake can bring on perimenopause sooner.  What are the signs or symptoms?  Hot flashes.  Night sweats.  Irregular menstrual periods.  Decreased sex drive.  Vaginal dryness.  Headaches.  Mood swings.  Depression.  Memory problems.  Irritability.  Tiredness.  Weight gain.  Trouble getting pregnant.  The beginning of losing bone cells (osteoporosis).  The beginning of hardening of the arteries (atherosclerosis). How is this diagnosed? Your health care provider will make a diagnosis by analyzing your age, menstrual history, and symptoms. He or she will do a physical exam and note any changes in your body, especially your female organs. Female hormone tests may or may not be helpful depending on the amount of female hormones you produce and when you produce them. However, other hormone tests may be helpful to rule out other problems. How is this treated? In some cases, no treatment is needed. The decision on whether  treatment is necessary during the perimenopause should be made by you and your health care provider based on how the symptoms are affecting you and your lifestyle. Various treatments are available, such as:  Treating individual symptoms with a specific medicine for that symptom.  Herbal medicines that can help specific symptoms.  Counseling.  Group therapy.  Follow these instructions at home:  Keep track of your menstrual periods (when they occur, how heavy they are, how long between periods, and how long they last) as well as your symptoms and when they started.  Only take over-the-counter or prescription medicines as directed by your health care provider.  Sleep and rest.  Exercise.  Eat a diet that contains calcium (good for your bones) and soy (acts like the estrogen hormone).  Do not smoke.  Avoid alcoholic beverages.  Take vitamin supplements as recommended by your health care provider. Taking vitamin E may help in certain cases.  Take calcium and vitamin D supplements to help prevent bone loss.  Group therapy is sometimes helpful.  Acupuncture may help in some cases. Contact a health care provider if:  You have questions about any symptoms you are having.  You need a referral to a specialist (gynecologist, psychiatrist, or psychologist). Get help right away if:  You have vaginal bleeding.  Your period lasts longer than 8 days.  Your periods are recurring sooner than 21 days.  You have bleeding after intercourse.  You have severe depression.  You have pain when you urinate.  You have severe headaches.  You have vision problems. This information is not intended to replace advice given to you by your health care provider. Make sure you  discuss any questions you have with your health care provider. Document Released: 04/25/2004 Document Revised: 08/24/2015 Document Reviewed: 10/15/2012 Elsevier Interactive Patient Education  2017 [employer].

## 2016-12-13 ENCOUNTER — Other Ambulatory Visit: Payer: Self-pay | Admitting: Internal Medicine

## 2016-12-13 DIAGNOSIS — E890 Postprocedural hypothyroidism: Secondary | ICD-10-CM

## 2016-12-13 LAB — COMPREHENSIVE METABOLIC PANEL
ALK PHOS: 87 IU/L (ref 39–117)
ALT: 16 IU/L (ref 0–32)
AST: 20 IU/L (ref 0–40)
Albumin/Globulin Ratio: 1.2 (ref 1.2–2.2)
Albumin: 4.4 g/dL (ref 3.5–5.5)
BILIRUBIN TOTAL: 0.5 mg/dL (ref 0.0–1.2)
BUN / CREAT RATIO: 14 (ref 9–23)
BUN: 16 mg/dL (ref 6–24)
CHLORIDE: 101 mmol/L (ref 96–106)
CO2: 25 mmol/L (ref 20–29)
Calcium: 9.9 mg/dL (ref 8.7–10.2)
Creatinine, Ser: 1.18 mg/dL — ABNORMAL HIGH (ref 0.57–1.00)
GFR calc Af Amer: 61 mL/min/{1.73_m2} (ref >59)
GFR calc non Af Amer: 53 mL/min/{1.73_m2} — ABNORMAL LOW (ref >59)
GLUCOSE: 97 mg/dL (ref 65–99)
Globulin, Total: 3.8 g/dL (ref 1.5–4.5)
Potassium: 3.8 mmol/L (ref 3.5–5.2)
Sodium: 141 mmol/L (ref 134–144)
Total Protein: 8.2 g/dL (ref 6.0–8.5)

## 2016-12-13 LAB — HEMOGLOBIN A1C
Est. average glucose Bld gHb Est-mCnc: 103 mg/dL
Hgb A1c MFr Bld: 5.2 % (ref 4.8–5.6)

## 2016-12-13 LAB — CBC
HEMATOCRIT: 42.6 % (ref 34.0–46.6)
HEMOGLOBIN: 13.9 g/dL (ref 11.1–15.9)
MCH: 27.7 pg (ref 26.6–33.0)
MCHC: 32.6 g/dL (ref 31.5–35.7)
MCV: 85 fL (ref 79–97)
Platelets: 281 10*3/uL (ref 150–379)
RBC: 5.01 x10E6/uL (ref 3.77–5.28)
RDW: 14.1 % (ref 12.3–15.4)
WBC: 5.2 10*3/uL (ref 3.4–10.8)

## 2016-12-13 LAB — LIPID PANEL
CHOL/HDL RATIO: 2.6 ratio (ref 0.0–4.4)
Cholesterol, Total: 180 mg/dL (ref 100–199)
HDL: 68 mg/dL (ref >39)
LDL CALC: 89 mg/dL (ref 0–99)
Triglycerides: 116 mg/dL (ref 0–149)
VLDL CHOLESTEROL CAL: 23 mg/dL (ref 5–40)

## 2016-12-13 LAB — TSH: TSH: 5.34 u[IU]/mL — ABNORMAL HIGH (ref 0.450–4.500)

## 2016-12-13 MED ORDER — LEVOTHYROXINE SODIUM 125 MCG PO TABS: 125.0000 ug | ORAL_TABLET | Freq: Every day | ORAL | 5 refills | Status: DC

## 2016-12-16 MED FILL — ?LEVOTHYROXINE 125 MCG TABL: 125 | 30 days supply | Qty: 30 | Fill #0

## 2016-12-17 ENCOUNTER — Telehealth: Payer: Self-pay

## 2016-12-17 NOTE — Telephone Encounter (Signed)
Contacted pt to go over lab results pt is aware of results and doesn't have any questions or concerns 

## 2016-12-25 MED FILL — AMLODIPINE BESYLATE 10 MG T: 10 | 30 days supply | Qty: 30 | Fill #3

## 2016-12-25 MED FILL — LOSARTAN-HCTZ 100-25 MG TAB: 100-25 | 30 days supply | Qty: 30 | Fill #7

## 2016-12-27 ENCOUNTER — Other Ambulatory Visit: Payer: Self-pay | Admitting: Internal Medicine

## 2016-12-27 DIAGNOSIS — G8929 Other chronic pain: Secondary | ICD-10-CM

## 2016-12-27 DIAGNOSIS — M25561 Pain in right knee: Principal | ICD-10-CM

## 2016-12-30 MED FILL — MELOXICAM 7.5 MG TABLET: 7.5 | 15 days supply | Qty: 30 | Fill #0

## 2017-01-15 ENCOUNTER — Ambulatory Visit (INDEPENDENT_AMBULATORY_CARE_PROVIDER_SITE_OTHER): Payer: Self-pay | Admitting: Orthopedic Surgery

## 2017-01-15 ENCOUNTER — Encounter (INDEPENDENT_AMBULATORY_CARE_PROVIDER_SITE_OTHER): Payer: Self-pay | Admitting: Orthopedic Surgery

## 2017-01-15 DIAGNOSIS — G8929 Other chronic pain: Secondary | ICD-10-CM

## 2017-01-15 DIAGNOSIS — M25561 Pain in right knee: Secondary | ICD-10-CM

## 2017-01-15 NOTE — Progress Notes (Signed)
Office Visit Note   Patient: Ann Jarvis           Date of Birth: [birth year]           MRN: [id number] Visit Date: 01/15/2017 Requested by: Marcine MatarJohnson, Deborah B, MD [address] [place], [place] [id number] PCP: Marcine MatarJohnson, Deborah B, MD  Subjective: Chief Complaint  Patient presents with  . Right Knee - Pain    HPI: patient is 51 year old with long-standing history of right knee pain.  It is been going on for years.  Denies any history of injury.  Describes weakness and giving way occasionally will wake her from sleep at night with pain.  Reviewed radiographs from 2017 which show there is a small lateral tibial plateau osteophyte.  Moderate effusion present at the time as well.she gives a definite history of mechanical symptoms.  She has had an injection.  She denies any discrete history of injury              ROS: All systems reviewed are negative as they relate to the chief complaint within the history of present illness.  Patient denies  fevers or chills.   Assessment & Plan: Visit Diagnoses:  1. Chronic pain of right knee     Plan: impression is right knee pain with likely lateral meniscal pathology.  She has had conservative treatment.  Plan is MRI scan right knee to evaluate lateral meniscal tear.  I will see her back after that study.  She is currently taking all types of over-the-counter medication for the problem  Follow-Up Instructions: Return for after MRI.   Orders:  Orders Placed This Encounter  Procedures  . MR Knee Right w/o contrast   No orders of the defined types were placed in this encounter.     Procedures: No procedures performed   Clinical Data: No additional findings.  Objective: Vital Signs: LMP 10/07/2015   Physical Exam:   Constitutional: Patient appears well-developed HEENT:  Head: Normocephalic Eyes:EOM are normal Neck: Normal range of motion Cardiovascular: Normal rate Pulmonary/chest: Effort normal Neurologic: Patient is  alert Skin: Skin is warm Psychiatric: Patient has normal mood and affect    Ortho Exam: orthopedic exam demonstrates normal gait and alignment.  Lateral greater than medial joint line tenderness on the right knee.  Extensor mechanism is intact.  There is periretinacular tenderness  collateral and cruciate ligaments sta Negative McMurray compression testing  Specialty Comments:  No specialty comments available.  Imaging: No results found.   PMFS History: Patient Active Problem List   Diagnosis Date Noted  . Chronic pain of right knee 12/12/2016  . Chronic pain of left ankle 12/12/2016  . 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 Hypothyroidism 11/13/2015  . Bell's palsy 10/09/2011  . TOBACCO ABUSE 10/10/2008  . COCAINE ABUSE 08/17/2007  . Essential hypertension 08/17/2007   Past Medical History:  Diagnosis Date  . Cocaine abuse (HCC)   . Grave's disease    s/p radioiodine ablation 03/05/10, F/B Dr. Chestine Sporelark. On Methimazole  . Hypertension   . Hyperthyroidism   . Scabies   . Thyroid condition   . Tobacco abuse     No family history on file.  Past Surgical History:  Procedure Laterality Date  . BTL    . MOUTH SURGERY     Social History   Occupational History  . Not on file.   Social History Main Topics  . Smoking status: Current Some Day Smoker    Packs/day: 0.25  Types: Cigarettes  . Smokeless tobacco: Never Used     Comment: 1 cig a day  . Alcohol use 0.6 - 1.2 oz/week    1 - 2 Cans of beer per week     Comment: occ  . Drug use: Yes    Types: Marijuana     Comment: marijuana  . Sexual activity: Not on file

## 2017-01-30 MED FILL — LOSARTAN-HCTZ 100-25 MG TAB: 100-25 | 30 days supply | Qty: 30 | Fill #8

## 2017-01-30 MED FILL — ?LEVOTHYROXINE 125 MCG TABL: 125 | 30 days supply | Qty: 30 | Fill #1

## 2017-01-30 MED FILL — AMLODIPINE BESYLATE 10 MG T: 10 | 30 days supply | Qty: 30 | Fill #4

## 2017-02-10 ENCOUNTER — Encounter (INDEPENDENT_AMBULATORY_CARE_PROVIDER_SITE_OTHER): Payer: Self-pay | Admitting: *Deleted

## 2017-02-10 MED FILL — MELOXICAM 7.5 MG TABLET: 7.5 | 15 days supply | Qty: 30 | Fill #1

## 2017-02-11 ENCOUNTER — Telehealth: Payer: Self-pay | Admitting: Internal Medicine

## 2017-02-11 NOTE — Telephone Encounter (Signed)
Patient called and asked if she would have a refill on tramadol. Please fu

## 2017-02-12 NOTE — Telephone Encounter (Signed)
Returned pt call and pt mailbox is full

## 2017-02-13 ENCOUNTER — Telehealth: Payer: Self-pay | Admitting: Internal Medicine

## 2017-02-13 NOTE — Telephone Encounter (Signed)
Pt called to check on the statu of the Tramadol, please call her back

## 2017-02-27 ENCOUNTER — Other Ambulatory Visit: Payer: Self-pay

## 2017-02-27 ENCOUNTER — Ambulatory Visit: Payer: Self-pay | Attending: Internal Medicine | Admitting: Physician Assistant

## 2017-02-27 DIAGNOSIS — M25561 Pain in right knee: Secondary | ICD-10-CM | POA: Insufficient documentation

## 2017-02-27 DIAGNOSIS — G8929 Other chronic pain: Secondary | ICD-10-CM

## 2017-02-27 MED ORDER — TRAMADOL HCL 50 MG PO TABS: 50.0000 mg | ORAL_TABLET | Freq: Three times a day (TID) | ORAL | 0 refills | Status: DC | PRN

## 2017-02-27 MED ORDER — MELOXICAM 7.5 MG PO TABS: 15.0000 mg | ORAL_TABLET | Freq: Every day | ORAL | 2 refills | Status: DC

## 2017-02-27 NOTE — Progress Notes (Signed)
Right knee pain Request Tramadol for knee pain

## 2017-03-04 MED FILL — MELOXICAM 7.5 MG TABLET: 7.5 | 15 days supply | Qty: 30 | Fill #0

## 2017-03-05 MED FILL — ?LEVOTHYROXINE 125 MCG TABL: 125 | 30 days supply | Qty: 30 | Fill #2

## 2017-03-05 MED FILL — LOSARTAN-HCTZ 100-25 MG TAB: 100-25 | 30 days supply | Qty: 30 | Fill #9

## 2017-03-05 MED FILL — AMLODIPINE BESYLATE 10 MG T: 10 | 30 days supply | Qty: 30 | Fill #5

## 2017-03-13 ENCOUNTER — Encounter: Payer: Self-pay | Admitting: Internal Medicine

## 2017-03-13 ENCOUNTER — Ambulatory Visit: Payer: Self-pay | Attending: Internal Medicine | Admitting: Internal Medicine

## 2017-03-13 VITALS — BP 151/74 | HR 88 | Temp 98.7°F | Resp 18 | Ht 72.0 in | Wt 191.0 lb

## 2017-03-13 DIAGNOSIS — N951 Menopausal and female climacteric states: Secondary | ICD-10-CM | POA: Insufficient documentation

## 2017-03-13 DIAGNOSIS — M25572 Pain in left ankle and joints of left foot: Secondary | ICD-10-CM | POA: Insufficient documentation

## 2017-03-13 DIAGNOSIS — I10 Essential (primary) hypertension: Secondary | ICD-10-CM

## 2017-03-13 DIAGNOSIS — I129 Hypertensive chronic kidney disease with stage 1 through stage 4 chronic kidney disease, or unspecified chronic kidney disease: Secondary | ICD-10-CM | POA: Insufficient documentation

## 2017-03-13 DIAGNOSIS — Z1211 Encounter for screening for malignant neoplasm of colon: Secondary | ICD-10-CM

## 2017-03-13 DIAGNOSIS — E0500 Thyrotoxicosis with diffuse goiter without thyrotoxic crisis or storm: Secondary | ICD-10-CM | POA: Insufficient documentation

## 2017-03-13 DIAGNOSIS — Z880 Allergy status to penicillin: Secondary | ICD-10-CM | POA: Insufficient documentation

## 2017-03-13 DIAGNOSIS — F1410 Cocaine abuse, uncomplicated: Secondary | ICD-10-CM | POA: Insufficient documentation

## 2017-03-13 DIAGNOSIS — E890 Postprocedural hypothyroidism: Secondary | ICD-10-CM

## 2017-03-13 DIAGNOSIS — Z1231 Encounter for screening mammogram for malignant neoplasm of breast: Secondary | ICD-10-CM

## 2017-03-13 DIAGNOSIS — F17210 Nicotine dependence, cigarettes, uncomplicated: Secondary | ICD-10-CM | POA: Insufficient documentation

## 2017-03-13 DIAGNOSIS — G8929 Other chronic pain: Secondary | ICD-10-CM

## 2017-03-13 DIAGNOSIS — M25561 Pain in right knee: Secondary | ICD-10-CM | POA: Insufficient documentation

## 2017-03-13 DIAGNOSIS — G510 Bell's palsy: Secondary | ICD-10-CM | POA: Insufficient documentation

## 2017-03-13 DIAGNOSIS — Z79899 Other long term (current) drug therapy: Secondary | ICD-10-CM | POA: Insufficient documentation

## 2017-03-13 DIAGNOSIS — N183 Chronic kidney disease, stage 3 (moderate): Secondary | ICD-10-CM | POA: Insufficient documentation

## 2017-03-13 DIAGNOSIS — F1290 Cannabis use, unspecified, uncomplicated: Secondary | ICD-10-CM

## 2017-03-13 DIAGNOSIS — Z1239 Encounter for other screening for malignant neoplasm of breast: Secondary | ICD-10-CM

## 2017-03-13 DIAGNOSIS — R232 Flushing: Secondary | ICD-10-CM

## 2017-03-13 MED ORDER — DULOXETINE HCL 20 MG PO CPEP: 20.0000 mg | ORAL_CAPSULE | Freq: Every day | ORAL | 3 refills | Status: DC

## 2017-03-13 NOTE — Patient Instructions (Signed)
Take Levothyroxine first thing in the mornings by itself. After 30 minutes you can eat and take your other  Medications.

## 2017-03-13 NOTE — Progress Notes (Signed)
Patient ID: Ann Jarvis, female    DOB: [birth year]  MRN: [id number]  CC: Follow-up   Subjective: Ann Jarvis is a 51 y.o. female who presents for chronic ds management Her concerns today include:  Hx HTN, Tob, Hypothyroid post treatment for Graves' disease, cocaine abuse, CKD stage 3 (last GFR improved to 66 in 05/2016).  1. RT knee pain: Saw PA last month.  Given a limited supply of tramadol which she found helpful.  Has appt for MRI 03/26/2017.  Referred to [facility] on last visit with me but she does not have insurance. -will apply for OC  -does a lot of standing at work.  Mobic helps but knee still hurts with a lot of standing and walking  2.  HTN: compliant with meds. Needs to do better with limiting salt -no CP/SOB/LE edema/HA  3. Thyroid:  Levoxyl increased on last visit based on lab results. Consistent with taking but takes in the mornings with all of her other meds.  4.   Hot flashes: feels Gabapentin does not help so she stopped taking after 1 wk.   HM:  Did not get MMG as yet as ordered on last visit.  Never got appointment with gastroenterology for colonoscopy  Patient Active Problem List   Diagnosis Date Noted  . Hot flashes 03/13/2017  . Chronic pain of right knee 12/12/2016  . Chronic pain of left ankle 12/12/2016  . 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 Hypothyroidism 11/13/2015  . Bell's palsy 10/09/2011  . TOBACCO ABUSE 10/10/2008  . COCAINE ABUSE 08/17/2007  . Essential hypertension 08/17/2007     Current Outpatient Medications on File Prior to Visit  Medication Sig Dispense Refill  . amLODipine (NORVASC) 10 MG tablet Take 1 tablet (10 mg total) by mouth daily. Must have office visit for refills 90 tablet 2  . levothyroxine (SYNTHROID, LEVOTHROID) 125 MCG tablet Take 1 tablet (125 mcg total) by mouth daily before breakfast. 30 tablet 5  . losartan-hydrochlorothiazide (HYZAAR) 100-25 MG tablet Take 1 tablet by mouth daily. 90 tablet 3  .  meloxicam (MOBIC) 7.5 MG tablet Take 2 tablets (15 mg total) by mouth daily. 30 tablet 2   No current facility-administered medications on file prior to visit.     Allergies  Allergen Reactions  . Penicillins Swelling    Social History   Socioeconomic History  . Marital status: Single    Spouse name: Not on file  . Number of children: Not on file  . Years of education: Not on file  . Highest education level: Not on file  Social Needs  . Financial resource strain: Not on file  . Food insecurity - worry: Not on file  . Food insecurity - inability: Not on file  . Transportation needs - medical: Not on file  . Transportation needs - non-medical: Not on file  Occupational History  . Not on file  Tobacco Use  . Smoking status: Current Some Day Smoker    Packs/day: 0.25    Types: Cigarettes  . Smokeless tobacco: Never Used  . Tobacco comment: 1 cig a day  Substance and Sexual Activity  . Alcohol use: Yes    Alcohol/week: 0.6 - 1.2 oz    Types: 1 - 2 Cans of beer per week    Comment: occ  . Drug use: Yes    Types: Marijuana    Comment: marijuana  . Sexual activity: Not on file  Other Topics Concern  . Not on file  Social History Narrative   Smokes 1 pack/week x 20 years, smokes marijuna, no cocaine in the last year.  Works for [employer].      [facility] and has [facility] card Rudell CobbDeborah Hill Aug 01 2009 8.58am   Financial assistance [facility] for [facility] and [facility] Gavin Poundeborah February 02, 2010 12.20pm    History reviewed. No pertinent family history.  Past Surgical History:  Procedure Laterality Date  . BTL    . MOUTH SURGERY      ROS: Review of Systems Psy: Denies use of cocaine.  States she has been clean off that for a while.  However does use marijuana PHYSICAL EXAM: BP (!) 151/74 (BP Location: Left Arm, Patient Position: Sitting, Cuff Size: Normal)   Pulse 88   Temp 98.7 F (37.1 C) (Oral)    Resp 18   Ht 6' (1.829 m)   Wt 191 lb (86.6 kg)   LMP 10/07/2015   SpO2 100%   BMI 25.90 kg/m   Wt Readings from Last 3 Encounters:  03/13/17 191 lb (86.6 kg)  02/27/17 195 lb 12.8 oz (88.8 kg)  12/12/16 190 lb 12.8 oz (86.5 kg)   BP 140/80 Physical Exam General appearance - alert, well appearing, and in no distress Mental status - alert, oriented to person, place, and time, normal mood, behavior, speech, dress, motor activity, and thought processes Neck - supple, no significant adenopathy Chest - clear to auscultation, no wheezes, rales or rhonchi, symmetric air entry Heart - normal rate, regular rhythm, normal S1, S2, no murmurs, rubs, clicks or gallops Extremities - peripheral pulses normal, no pedal edema, no clubbing or cyanosis  ASSESSMENT AND PLAN: 1. Essential hypertension At goal.  Continue amlodipine and lisinopril/HCTZ Encourage salt restriction  2. Chronic pain of right knee Continue meloxicam.  Discontinue gabapentin. Add low-dose Cymbalta  3. Postoperative hypothyroidism - TSH  4. Hot flashes Stop gabapentin.  Cymbalta may or may not help with this  5. Breast cancer screening -We will give info for her to call and schedule her mammogram  6. Colon cancer screening - Fecal occult blood, imunochemical  7. Marijuana use Discouraged use.   Patient was given the opportunity to ask questions.  Patient verbalized understanding of the plan and was able to repeat key elements of the plan.   Orders Placed This Encounter  Procedures  . Fecal occult blood, imunochemical  . TSH     Requested Prescriptions   Signed Prescriptions Disp Refills  . DULoxetine (CYMBALTA) 20 MG capsule 30 capsule 3    Sig: Take 1 capsule (20 mg total) by mouth daily.    Return in about 3 months (around 06/11/2017).  Jonah Blueeborah Keisha Amer, MD, FACP

## 2017-03-14 LAB — TSH: TSH: 2.59 u[IU]/mL (ref 0.450–4.500)

## 2017-03-21 ENCOUNTER — Telehealth: Payer: Self-pay | Admitting: *Deleted

## 2017-03-21 NOTE — Telephone Encounter (Signed)
Patients mailbox is full. !!Please inform patient of thyroid being normal and to continue current dosing!!!

## 2017-03-21 NOTE — Telephone Encounter (Signed)
-----   Message from Marcine Matareborah B Johnson, MD sent at 03/14/2017  9:08 PM EST ----- Let pt know that thyroid level is good. Continue current dose of thyroid medication.

## 2017-03-26 ENCOUNTER — Other Ambulatory Visit: Payer: Self-pay

## 2017-03-27 ENCOUNTER — Telehealth: Payer: Self-pay

## 2017-03-27 NOTE — Telephone Encounter (Signed)
Letter was returned for the the referral for screening colonoscopy. I tried to call the phone number [phone] and VM full and could not leave a message.

## 2017-03-27 NOTE — Telephone Encounter (Signed)
Pt called right back and I have updated her address to [address], [place], [place]  [id number]. [place], [place] and she said [place] in [place] and she will not be able to come here for a colonoscopy. I am forwarding the note to Jonah Blueeborah Johnson, who referred her.

## 2017-04-03 ENCOUNTER — Other Ambulatory Visit: Payer: Self-pay

## 2017-04-03 MED ORDER — LOSARTAN POTASSIUM-HCTZ 100-25 MG PO TABS: 1.0000 | ORAL_TABLET | Freq: Every day | ORAL | 2 refills | Status: DC

## 2017-04-17 MED FILL — AMLODIPINE BESYLATE 10 MG T: 10 | 30 days supply | Qty: 30 | Fill #6

## 2017-04-17 MED FILL — LOSARTAN-HCTZ 100-25 MG TAB: 100-25 | 30 days supply | Qty: 30 | Fill #0

## 2017-04-17 MED FILL — ?LEVOTHYROXINE 125 MCG TABL: 125 | 30 days supply | Qty: 30 | Fill #3

## 2017-05-23 MED FILL — LOSARTAN-HCTZ 100-25 MG TAB: 100-25 | 30 days supply | Qty: 30 | Fill #1

## 2017-05-23 MED FILL — AMLODIPINE BESYLATE 10 MG T: 10 | 30 days supply | Qty: 30 | Fill #7

## 2017-05-23 MED FILL — ?LEVOTHYROXINE 125 MCG TABL: 125 | 30 days supply | Qty: 30 | Fill #4

## 2017-06-12 ENCOUNTER — Ambulatory Visit: Payer: Self-pay | Attending: Internal Medicine | Admitting: Internal Medicine

## 2017-06-12 ENCOUNTER — Encounter: Payer: Self-pay | Admitting: Internal Medicine

## 2017-06-12 VITALS — BP 170/97 | HR 62 | Temp 98.4°F | Resp 16 | Wt 187.0 lb

## 2017-06-12 DIAGNOSIS — F17200 Nicotine dependence, unspecified, uncomplicated: Secondary | ICD-10-CM

## 2017-06-12 DIAGNOSIS — F17210 Nicotine dependence, cigarettes, uncomplicated: Secondary | ICD-10-CM | POA: Insufficient documentation

## 2017-06-12 DIAGNOSIS — Z79899 Other long term (current) drug therapy: Secondary | ICD-10-CM | POA: Insufficient documentation

## 2017-06-12 DIAGNOSIS — Z1211 Encounter for screening for malignant neoplasm of colon: Secondary | ICD-10-CM

## 2017-06-12 DIAGNOSIS — N183 Chronic kidney disease, stage 3 (moderate): Secondary | ICD-10-CM | POA: Insufficient documentation

## 2017-06-12 DIAGNOSIS — E039 Hypothyroidism, unspecified: Secondary | ICD-10-CM | POA: Insufficient documentation

## 2017-06-12 DIAGNOSIS — F1410 Cocaine abuse, uncomplicated: Secondary | ICD-10-CM | POA: Insufficient documentation

## 2017-06-12 DIAGNOSIS — I129 Hypertensive chronic kidney disease with stage 1 through stage 4 chronic kidney disease, or unspecified chronic kidney disease: Secondary | ICD-10-CM | POA: Insufficient documentation

## 2017-06-12 DIAGNOSIS — E890 Postprocedural hypothyroidism: Secondary | ICD-10-CM | POA: Insufficient documentation

## 2017-06-12 DIAGNOSIS — I10 Essential (primary) hypertension: Secondary | ICD-10-CM | POA: Insufficient documentation

## 2017-06-12 DIAGNOSIS — Z880 Allergy status to penicillin: Secondary | ICD-10-CM | POA: Insufficient documentation

## 2017-06-12 DIAGNOSIS — J069 Acute upper respiratory infection, unspecified: Secondary | ICD-10-CM | POA: Insufficient documentation

## 2017-06-12 MED ORDER — BENZONATATE 100 MG PO CAPS: 100.0000 mg | ORAL_CAPSULE | Freq: Two times a day (BID) | ORAL | 0 refills | Status: DC | PRN

## 2017-06-12 NOTE — Progress Notes (Signed)
Patient ID: Ann Jarvis, female    DOB: 1965-04-23  MRN: [id number]  CC: Hypertension   Subjective: Ann Jarvis is a 52 y.o. female who presents for chronic ds management Her concerns today include:  Hx HTN, Tob, Hypothyroid post treatment for Graves' disease, cocaine abuse, CKD stage 3(last GFR improved to 66 in 05/2016).  1.  HTN:  Forgot to take meds today.  She rushed out of the house this morning but plans to go back home today before going to work.  She will take her medicines then. Limits salt.  Denies any chest pains, shortness of breath or lower extremity edema.  2.  Thyroid: Last TSH was normal.  Tolerating levothyroxine.  Denies any unexplained weight loss or gain.  No palpitations.  3.  Colon CA screen: Given fit test on last visit.  She has not turned it in as yet but plans to do so  4.  Tob dependence: Marks mainly on weekends when she has a beer or mix drink  5.  Requesting something for cough.  Had bad cold this past wk.  Coughing at nights.  No fever or shortness of breath Patient Active Problem List   Diagnosis Date Noted  . Hot flashes 03/13/2017  . Chronic pain of right knee 12/12/2016  . Chronic pain of left ankle 12/12/2016  . 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 Hypothyroidism 11/13/2015  . Bell's palsy 10/09/2011  . TOBACCO ABUSE 10/10/2008  . COCAINE ABUSE 08/17/2007  . Essential hypertension 08/17/2007     Current Outpatient Medications on File Prior to Visit  Medication Sig Dispense Refill  . amLODipine (NORVASC) 10 MG tablet Take 1 tablet (10 mg total) by mouth daily. Must have office visit for refills 90 tablet 2  . DULoxetine (CYMBALTA) 20 MG capsule Take 1 capsule (20 mg total) by mouth daily. 30 capsule 3  . levothyroxine (SYNTHROID, LEVOTHROID) 125 MCG tablet Take 1 tablet (125 mcg total) by mouth daily before breakfast. 30 tablet 5  . losartan-hydrochlorothiazide (HYZAAR) 100-25 MG tablet Take 1 tablet by mouth daily. 90  tablet 2  . meloxicam (MOBIC) 7.5 MG tablet Take 2 tablets (15 mg total) by mouth daily. 30 tablet 2   No current facility-administered medications on file prior to visit.     Allergies  Allergen Reactions  . Penicillins Swelling    Social History   Socioeconomic History  . Marital status: Single    Spouse name: Not on file  . Number of children: Not on file  . Years of education: Not on file  . Highest education level: Not on file  Social Needs  . Financial resource strain: Not on file  . Food insecurity - worry: Not on file  . Food insecurity - inability: Not on file  . Transportation needs - medical: Not on file  . Transportation needs - non-medical: Not on file  Occupational History  . Not on file  Tobacco Use  . Smoking status: Current Some Day Smoker    Packs/day: 0.25    Types: Cigarettes  . Smokeless tobacco: Never Used  . Tobacco comment: 1 cig a day  Substance and Sexual Activity  . Alcohol use: Yes    Alcohol/week: 0.6 - 1.2 oz    Types: 1 - 2 Cans of beer per week    Comment: occ  . Drug use: Yes    Types: Marijuana    Comment: marijuana  . Sexual activity: Not on file  Other Topics Concern  .  Not on file  Social History Narrative   Smokes 1 pack/week x 20 years, smokes marijuna, no cocaine in the last year.  Works for [employer].      Financial [facility] and [facility] Rudell CobbDeborah Hill Aug 01 2009 8.58am   Financial [facility] and [facility] Gavin Poundeborah February 02, 2010 12.20pm    No family history on file.  Past Surgical History:  Procedure Laterality Date  . BTL    . MOUTH SURGERY      ROS: Review of Systems Negative except as stated above. PHYSICAL EXAM: BP (!) 170/97   Pulse 62   Temp 98.4 F (36.9 C) (Oral)   Resp 16   Wt 187 lb (84.8 kg)   LMP 10/07/2015   SpO2 95%   BMI 25.36 kg/m   Wt Readings from Last 3 Encounters:  06/12/17 187 lb (84.8 kg)    03/13/17 191 lb (86.6 kg)  02/27/17 195 lb 12.8 oz (88.8 kg)    Physical Exam  General appearance - alert, well appearing, and in no distress Mental status - alert, oriented to person, place, and time Neck - supple, no significant adenopathy Chest - clear to auscultation, no wheezes, rales or rhonchi, symmetric air entry Heart - normal rate, regular rhythm, normal S1, S2, no murmurs, rubs, clicks or gallops Extremities - peripheral pulses normal, no pedal edema, no clubbing or cyanosis  Lab Results  Component Value Date   TSH 2.590 03/13/2017    ASSESSMENT AND PLAN: 1. Essential hypertension Not at goal.  Discussed cardiovascular risks associated with uncontrolled hypertension.  She will return home today to take amlodipine and Cozaar/HCTZ before going to work.  Continue to limit salt in the foods.  2. Postoperative hypothyroidism Continue levothyroxine.  3. Upper respiratory tract infection, unspecified type - benzonatate (TESSALON) 100 MG capsule; Take 1 capsule (100 mg total) by mouth 2 (two) times daily as needed for cough.  Dispense: 20 capsule; Refill: 0  4. Colon cancer screening Encouraged her to complete the fit test and mail in.  5. Tobacco dependence Continue to encourage her towards smoking cessation completely  Patient was given the opportunity to ask questions.  Patient verbalized understanding of the plan and was able to repeat key elements of the plan.   No orders of the defined types were placed in this encounter.    Requested Prescriptions   Signed Prescriptions Disp Refills  . benzonatate (TESSALON) 100 MG capsule 20 capsule 0    Sig: Take 1 capsule (100 mg total) by mouth 2 (two) times daily as needed for cough.    Return in about 3 months (around 09/12/2017).  Jonah Blueeborah Jackolyn Geron, MD, FACP

## 2017-06-12 NOTE — Patient Instructions (Signed)
Please take your blood pressure medications every day as prescribed.   Please remember to use the stool card and mail it in the same day that it is used.

## 2017-06-21 ENCOUNTER — Other Ambulatory Visit: Payer: Self-pay

## 2017-06-21 ENCOUNTER — Emergency Department (HOSPITAL_COMMUNITY)
Admission: EM | Admit: 2017-06-21 | Discharge: 2017-06-21 | Disposition: A | Discharge status: Home / Self Care | Payer: Self-pay | Attending: Emergency Medicine | Admitting: Emergency Medicine

## 2017-06-21 ENCOUNTER — Encounter (HOSPITAL_COMMUNITY): Payer: Self-pay

## 2017-06-21 DIAGNOSIS — I10 Essential (primary) hypertension: Secondary | ICD-10-CM | POA: Insufficient documentation

## 2017-06-21 DIAGNOSIS — Z79899 Other long term (current) drug therapy: Secondary | ICD-10-CM | POA: Insufficient documentation

## 2017-06-21 DIAGNOSIS — F17210 Nicotine dependence, cigarettes, uncomplicated: Secondary | ICD-10-CM | POA: Insufficient documentation

## 2017-06-21 DIAGNOSIS — A539 Syphilis, unspecified: Secondary | ICD-10-CM | POA: Insufficient documentation

## 2017-06-21 DIAGNOSIS — E039 Hypothyroidism, unspecified: Secondary | ICD-10-CM | POA: Insufficient documentation

## 2017-06-21 MED ORDER — DOXYCYCLINE HYCLATE 100 MG PO CAPS: 100.0000 mg | ORAL_CAPSULE | Freq: Two times a day (BID) | ORAL | 0 refills | Status: AC

## 2017-06-21 NOTE — ED Provider Notes (Signed)
[facility] Provider Note   CSN: [id number] Arrival date & time: 06/21/17  1545     History   Chief Complaint Chief Complaint  Patient presents with  . syphilis    HPI Ann Jarvis is a 52 y.o. female presenting for management of syphilis.  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  They tested her for syphilis, and the results came back positive.  She received the results in the mail today.  She was told that she cannot donate blood again until the syphilis gets treated.  Patient brought the report with her.  She denies symptoms currently.  She states that she had a painless lesion on her vagina about a week ago which resolved several after several days without intervention.  She denies lesions elsewhere.  She denies fevers, chills, vaginal discharge, or urinary symptoms.  She denies rash elsewhere.  She states she has never been positive for any other STD in the past.  Patient has a history of allergy to penicillin.  This occurred when she was in junior high, she received a shot of penicillin and she had swelling of the hip.  She has not had any penicillin since.  HPI  Past Medical History:  Diagnosis Date  . Cocaine abuse (HCC)   . Grave's disease    s/p radioiodine ablation 03/05/10, F/B Dr. Chestine Sporelark. On Methimazole  . Hypertension   . Hyperthyroidism   . Scabies   . Thyroid condition   . Tobacco abuse     Patient Active Problem List   Diagnosis Date Noted  . Hot flashes 03/13/2017  . Chronic pain of right knee 12/12/2016  . Chronic pain of left ankle 12/12/2016  . 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 Hypothyroidism 11/13/2015  . Bell's palsy 10/09/2011  . TOBACCO ABUSE 10/10/2008  . COCAINE ABUSE 08/17/2007  . Essential hypertension 08/17/2007    Past Surgical History:  Procedure Laterality Date  . BTL    . MOUTH SURGERY       OB History   None      Home Medications    Prior to Admission  medications   Medication Sig Start Date End Date Taking? Authorizing Provider  amLODipine (NORVASC) 10 MG tablet Take 1 tablet (10 mg total) by mouth daily. Must have office visit for refills 09/13/16   Marcine MatarJohnson, Deborah B, MD  benzonatate (TESSALON) 100 MG capsule Take 1 capsule (100 mg total) by mouth 2 (two) times daily as needed for cough. 06/12/17   Marcine MatarJohnson, Deborah B, MD  doxycycline (VIBRAMYCIN) 100 MG capsule Take 1 capsule (100 mg total) by mouth 2 (two) times daily for 14 days. 06/21/17 07/05/17  Cecylia Brazill, PA-C  DULoxetine (CYMBALTA) 20 MG capsule Take 1 capsule (20 mg total) by mouth daily. 03/13/17   Marcine MatarJohnson, Deborah B, MD  levothyroxine (SYNTHROID, LEVOTHROID) 125 MCG tablet Take 1 tablet (125 mcg total) by mouth daily before breakfast. 12/13/16   Marcine MatarJohnson, Deborah B, MD  losartan-hydrochlorothiazide (HYZAAR) 100-25 MG tablet Take 1 tablet by mouth daily. 04/03/17   Marcine MatarJohnson, Deborah B, MD  meloxicam (MOBIC) 7.5 MG tablet Take 2 tablets (15 mg total) by mouth daily. 02/27/17   Anders SimmondsMcClung, Angela M, PA-C    Family History No family history on file.  Social History Social History   Tobacco Use  . Smoking status: Current Some Day Smoker    Packs/day: 0.25    Types: Cigarettes  . Smokeless tobacco: Never Used  .  Tobacco comment: 1 cig a day  Substance Use Topics  . Alcohol use: Yes    Alcohol/week: 0.6 - 1.2 oz    Types: 1 - 2 Cans of beer per week    Comment: occ  . Drug use: Yes    Types: Marijuana    Comment: marijuana     Allergies   Penicillins   Review of Systems Review of Systems  Constitutional: Negative for chills and fever.  Genitourinary: Negative for dysuria, frequency, hematuria, vaginal discharge and vaginal pain.       Painless lesion on vaginal which resolved     Physical Exam Updated Vital Signs BP (!) 147/82 (BP Location: Right Arm)   Pulse 62   Temp 98.9 F (37.2 C) (Oral)   Resp 17   Ht 5\' 11"  (1.803 m)   Wt 85.7 kg (189 lb)   LMP  10/07/2015   SpO2 99%   BMI 26.36 kg/m   Physical Exam  Constitutional: She is oriented to person, place, and time. She appears well-developed and well-nourished. No distress.  HENT:  Head: Normocephalic and atraumatic.  Eyes: EOM are normal.  Neck: Normal range of motion.  Cardiovascular: Normal rate, regular rhythm and intact distal pulses.  Pulmonary/Chest: Effort normal and breath sounds normal. No respiratory distress. She has no wheezes.  Abdominal: She exhibits no distension.  Musculoskeletal: Normal range of motion.  Neurological: She is alert and oriented to person, place, and time.  Skin: Skin is warm. No rash noted.  Psychiatric: She has a normal mood and affect.  Nursing note and vitals reviewed.    ED Treatments / Results  Labs (all labs ordered are listed, but only abnormal results are displayed) Labs Reviewed - No data to display  EKG None  Radiology No results found.  Procedures Procedures (including critical care time)  Medications Ordered in ED Medications - No data to display   Initial Impression / Assessment and Plan / ED Course  I have reviewed the triage vital signs and the nursing notes.  Pertinent labs & imaging results that were available during my care of the patient were reviewed by me and considered in my medical decision making (see chart for details).     Patient presenting for treatment for syphilis.  Currently without symptoms, however she brought her lab work which shows a positive syphilis test.  Patient has allergy to penicillin.  Called pharmacy to discuss allergy and options for treatment.  Pharmacy recommended doxycycline twice a day for 14 days.  Recommended follow-up testing in 6 months with primary care or sooner if symptoms recur.  Discussed plan with patient.  At this time, patient appears safe for discharge.  Return precautions given.  Patient states she understands and agrees to plan.   Final Clinical Impressions(s) / ED  Diagnoses   Final diagnoses:  Syphilis    ED Discharge Orders        Ordered    doxycycline (VIBRAMYCIN) 100 MG capsule  2 times daily     06/21/17 1802       Trayquan Kolakowski, PA-C 06/21/17 1841    Rolan Bucco, MD 06/21/17 2312

## 2017-06-21 NOTE — ED Triage Notes (Signed)
Pt presents to the ed with complaints of donating plasma and being told she had "a trace of syphilis". Pt denies complaints.

## 2017-06-21 NOTE — Discharge Instructions (Signed)
Take antibiotic as prescribed.  Take the entire course, even if your symptoms improve. It is important that you follow-up with your primary care doctor in 6 months for retesting of syphilis.  You should follow-up sooner if you develop painless lesions on your genitals, vaginal discharge and fevers. Return to the emergency room if you have any new or concerning symptoms.

## 2017-06-21 NOTE — ED Triage Notes (Signed)
PT came to the ED ,because while at the plasma bank tested positive for syphilis

## 2017-06-21 NOTE — ED Notes (Signed)
Declined W/C at D/C and was escorted to lobby by RN. 

## 2017-07-02 ENCOUNTER — Other Ambulatory Visit: Payer: Self-pay | Admitting: Internal Medicine

## 2017-07-02 DIAGNOSIS — I10 Essential (primary) hypertension: Secondary | ICD-10-CM

## 2017-07-02 DIAGNOSIS — E890 Postprocedural hypothyroidism: Secondary | ICD-10-CM

## 2017-07-02 MED FILL — LOSARTAN-HCTZ 100-25 MG TAB: 100-25 | 30 days supply | Qty: 30 | Fill #2

## 2017-07-02 MED FILL — LEVOTHYROXINE 125 MCG TAB: 125 | 30 days supply | Qty: 30 | Fill #5

## 2017-07-04 ENCOUNTER — Ambulatory Visit: Payer: Self-pay | Admitting: Internal Medicine

## 2017-08-19 ENCOUNTER — Other Ambulatory Visit: Payer: Self-pay | Admitting: Internal Medicine

## 2017-08-19 DIAGNOSIS — E890 Postprocedural hypothyroidism: Secondary | ICD-10-CM

## 2017-08-19 MED FILL — LOSARTAN-HCTZ 100-25 MG TAB: 100-25 | 30 days supply | Qty: 30 | Fill #3

## 2017-08-19 MED FILL — AMLODIPINE BESYLATE 10 MG T: 10 | 30 days supply | Qty: 30 | Fill #8

## 2017-08-20 MED FILL — ?LEVOTHYROXINE 125 MCG TABL: 125 | 30 days supply | Qty: 30 | Fill #0

## 2017-09-03 ENCOUNTER — Emergency Department (HOSPITAL_COMMUNITY)
Admission: EM | Admit: 2017-09-03 | Discharge: 2017-09-03 | Disposition: A | Discharge status: Home / Self Care | Payer: Self-pay | Attending: Emergency Medicine | Admitting: Emergency Medicine

## 2017-09-03 ENCOUNTER — Encounter (HOSPITAL_COMMUNITY): Payer: Self-pay | Admitting: Emergency Medicine

## 2017-09-03 DIAGNOSIS — M2550 Pain in unspecified joint: Secondary | ICD-10-CM

## 2017-09-03 DIAGNOSIS — E039 Hypothyroidism, unspecified: Secondary | ICD-10-CM | POA: Insufficient documentation

## 2017-09-03 DIAGNOSIS — F17210 Nicotine dependence, cigarettes, uncomplicated: Secondary | ICD-10-CM | POA: Insufficient documentation

## 2017-09-03 DIAGNOSIS — Z79899 Other long term (current) drug therapy: Secondary | ICD-10-CM | POA: Insufficient documentation

## 2017-09-03 DIAGNOSIS — I10 Essential (primary) hypertension: Secondary | ICD-10-CM | POA: Insufficient documentation

## 2017-09-03 DIAGNOSIS — M130 Polyarthritis, unspecified: Secondary | ICD-10-CM | POA: Insufficient documentation

## 2017-09-03 MED ORDER — MELOXICAM 7.5 MG PO TABS: 15.0000 mg | ORAL_TABLET | Freq: Every day | ORAL | 0 refills | Status: DC

## 2017-09-03 NOTE — ED Provider Notes (Signed)
[facility] EMERGENCY DEPARTMENT Provider Note   CSN: [id number] Arrival date & time: 09/03/17  4098     History   Chief Complaint Chief Complaint  Patient presents with  . Leg Pain    HPI Ann Jarvis is a 52 y.o. female.  HPI  52 year old female, she presents with polyarthralgias mostly in her knees ankles, and her shins as well as in her hands.  She states this is a chronic problem, she takes [place] powders chronically for this, she reports that her swelling comes up at the end of a shift where she stands for 8 hours but goes down in the morning.  She denies any swelling at this time.  No other symptoms or complaints.  She has had these pains for quite some time and review of the medical record shows multiple visits in the past for arthralgias.  Past Medical History:  Diagnosis Date  . Cocaine abuse (HCC)   . Grave's disease    s/p radioiodine ablation 03/05/10, F/B Dr. Chestine Spore. On Methimazole  . Hypertension   . Hyperthyroidism   . Scabies   . Thyroid condition   . Tobacco abuse     Patient Active Problem List   Diagnosis Date Noted  . Hot flashes 03/13/2017  . Chronic pain of right knee 12/12/2016  . Chronic pain of left ankle 12/12/2016  . 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 Hypothyroidism 11/13/2015  . Bell's palsy 10/09/2011  . TOBACCO ABUSE 10/10/2008  . COCAINE ABUSE 08/17/2007  . Essential hypertension 08/17/2007    Past Surgical History:  Procedure Laterality Date  . BTL    . MOUTH SURGERY       OB History   None      Home Medications    Prior to Admission medications   Medication Sig Start Date End Date Taking? Authorizing Provider  amLODipine (NORVASC) 10 MG tablet Take 1 tablet (10 mg total) by mouth daily. Must have office visit for refills 09/13/16   Marcine Matar, MD  benzonatate (TESSALON) 100 MG capsule Take 1 capsule (100 mg total) by mouth 2 (two) times daily as needed for cough. 06/12/17   Marcine Matar, MD  DULoxetine (CYMBALTA) 20 MG capsule Take 1 capsule (20 mg total) by mouth daily. 03/13/17   Marcine Matar, MD  levothyroxine (SYNTHROID, LEVOTHROID) 125 MCG tablet TAKE 1 TABLET BY MOUTH DAILY BEFORE BREAKFAST. 08/20/17   Marcine Matar, MD  losartan-hydrochlorothiazide (HYZAAR) 100-25 MG tablet Take 1 tablet by mouth daily. 04/03/17   Marcine Matar, MD  meloxicam (MOBIC) 7.5 MG tablet Take 2 tablets (15 mg total) by mouth daily. 09/03/17   Eber Hong, MD    Family History No family history on file.  Social History Social History   Tobacco Use  . Smoking status: Current Some Day Smoker    Packs/day: 0.25    Types: Cigarettes  . Smokeless tobacco: Never Used  . Tobacco comment: 1 cig a day  Substance Use Topics  . Alcohol use: Yes    Alcohol/week: 0.6 - 1.2 oz    Types: 1 - 2 Cans of beer per week    Comment: occ  . Drug use: Yes    Types: Marijuana    Comment: marijuana     Allergies   Penicillins   Review of Systems Review of Systems  Constitutional: Negative for fever.  Musculoskeletal: Positive for arthralgias.  Skin: Negative for rash.     Physical Exam Updated  Vital Signs BP (!) 173/92 (BP Location: Right Arm)   Pulse (!) 58   Temp 98.1 F (36.7 C) (Oral)   LMP 10/07/2015   SpO2 99%   Physical Exam  Constitutional: She appears well-developed and well-nourished.  HENT:  Head: Normocephalic and atraumatic.  Eyes: Conjunctivae are normal. Right eye exhibits no discharge. Left eye exhibits no discharge.  Pulmonary/Chest: Effort normal. No respiratory distress.  Musculoskeletal: She exhibits no edema, tenderness or deformity.  FROM of all joints - including - hips, knees and ankles - no swelling, normal strength above and below joints.  Neurological: She is alert. Coordination normal.  Normal strength and sensation, normal speech  Skin: Skin is warm and dry. No rash noted. She is not diaphoretic. No erythema.  rash    Psychiatric: She has a normal mood and affect.  Nursing note and vitals reviewed.    ED Treatments / Results  Labs (all labs ordered are listed, but only abnormal results are displayed) Labs Reviewed - No data to display  EKG None  Radiology No results found.  Procedures Procedures (including critical care time)  Medications Ordered in ED Medications - No data to display   Initial Impression / Assessment and Plan / ED Course  I have reviewed the triage vital signs and the nursing notes.  Pertinent labs & imaging results that were available during my care of the patient were reviewed by me and considered in my medical decision making (see chart for details).    The patient has polyarthralgias, this is chronic, there is nothing on exam to raise suspicion for other etiologies.  She has no joint effusions, no fever, no rashes to suggest Lyme's disease, she has recently been treated for syphilis a couple of months ago, no syphilitic rashes seen.  I have cautioned the patient about using Goody powders due to the aspirin content, we will switch her over to an anti-inflammatory and have her follow-up closely, she states she [facility]  Final Clinical Impressions(s) / ED Diagnoses   Final diagnoses:  Polyarthralgia  Essential hypertension    ED Discharge Orders        Ordered    meloxicam (MOBIC) 7.5 MG tablet  Daily     09/03/17 0805       Eber HongMiller, Americus Perkey, MD 09/03/17 [phone]

## 2017-09-03 NOTE — Discharge Instructions (Signed)
Start taking Mobic twice daily Stop using the goody powder - this has too much aspirin and can make you sick from the aspirin You will need to have your doctor run some tests to see if you have a medical cause of your joint pain - look for rheumatoid arthritis, lupus or other causes.  ER for increased redness, pain, fevers or swelling

## 2017-09-03 NOTE — ED Triage Notes (Addendum)
Patient complains of bilateral ankle, knee, and hip pain x3 days. Denies falling or other known injury. 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 Patient ambulating independently with steady gait and without difficulty. Alert and oriented and in no apparent distress at this time.

## 2017-09-12 ENCOUNTER — Ambulatory Visit: Payer: Self-pay | Admitting: Internal Medicine

## 2017-09-29 MED FILL — LOSARTAN-HCTZ 100-25 MG TAB: 100-25 | 30 days supply | Qty: 30 | Fill #4

## 2017-09-29 MED FILL — ?LEVOTHYROXINE 125 MCG TABL: 125 | 30 days supply | Qty: 30 | Fill #1

## 2017-10-07 ENCOUNTER — Ambulatory Visit: Payer: Self-pay | Admitting: Internal Medicine

## 2017-11-11 ENCOUNTER — Ambulatory Visit: Payer: Self-pay | Attending: Internal Medicine | Admitting: Internal Medicine

## 2017-11-11 VITALS — BP 171/141 | HR 62 | Temp 98.1°F | Resp 16 | Wt 185.0 lb

## 2017-11-11 DIAGNOSIS — Z79899 Other long term (current) drug therapy: Secondary | ICD-10-CM | POA: Insufficient documentation

## 2017-11-11 DIAGNOSIS — E890 Postprocedural hypothyroidism: Secondary | ICD-10-CM

## 2017-11-11 DIAGNOSIS — F17210 Nicotine dependence, cigarettes, uncomplicated: Secondary | ICD-10-CM | POA: Insufficient documentation

## 2017-11-11 DIAGNOSIS — I129 Hypertensive chronic kidney disease with stage 1 through stage 4 chronic kidney disease, or unspecified chronic kidney disease: Secondary | ICD-10-CM | POA: Insufficient documentation

## 2017-11-11 DIAGNOSIS — Z880 Allergy status to penicillin: Secondary | ICD-10-CM | POA: Insufficient documentation

## 2017-11-11 DIAGNOSIS — N183 Chronic kidney disease, stage 3 (moderate): Secondary | ICD-10-CM | POA: Insufficient documentation

## 2017-11-11 DIAGNOSIS — G8929 Other chronic pain: Secondary | ICD-10-CM

## 2017-11-11 DIAGNOSIS — I10 Essential (primary) hypertension: Secondary | ICD-10-CM

## 2017-11-11 DIAGNOSIS — M67479 Ganglion, unspecified ankle and foot: Secondary | ICD-10-CM

## 2017-11-11 DIAGNOSIS — M25561 Pain in right knee: Secondary | ICD-10-CM

## 2017-11-11 MED ORDER — LABETALOL HCL 100 MG PO TABS: 100.0000 mg | ORAL_TABLET | Freq: Two times a day (BID) | ORAL | 6 refills | Status: DC

## 2017-11-11 MED ORDER — DULOXETINE HCL 30 MG PO CPEP: 30.0000 mg | ORAL_CAPSULE | Freq: Every day | ORAL | 3 refills | Status: DC

## 2017-11-11 NOTE — Progress Notes (Signed)
Patient ID: Ann Jarvis, female    DOB: 05-29-1965  MRN: [id number]  CC: Hospitalization Follow-up (ED f/u) and Hypertension   Subjective: Ann Jarvis is a 52 y.o. female who presents for chronic ds management.   Her concerns today include:  Hx HTN, Tob, Hypothyroid post treatment for Graves' disease, cocaine abuse, CKD stage 3(last GFR improved to 66 in 05/2016).  C/o intermittent swelling in ankles x 2 wks.  Occurs after she does a lot of standing.  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  She had quit because the standing on the concrete she felt was contributing to swelling.  She has pics on phone to show me today of the ankle edema.  Review of the pictures looks like moderate swelling. -She also noted to soft tissue swelling on the lateral aspect of the mid foot bilaterally.  Continues to complain of pain in the right knee.  Mild swelling at times.  Positive stiffness.  Meloxicam does not help.  Cymbalta is on her list but she is not been taking that.  She states that she has applied for disability because of this knee and because of the swelling that she was recently having in her ankles.  She was working 2 jobs but has cut back to 1.  Thyroid: Compliant with taking levothyroxine.  No feeling of being hot or cold all the time.  Last TSH was 8 months ago.  HTN:  Reports compliance with Norvasc and Lis/HCTZ but out of the Norvasc x 3 wks. She tries to limit salt in the foods.  No chest pains or shortness of breath. Patient Active Problem List   Diagnosis Date Noted  . Hot flashes 03/13/2017  . Chronic pain of right knee 12/12/2016  . Chronic pain of left ankle 12/12/2016  . 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 Hypothyroidism 11/13/2015  . Bell's palsy 10/09/2011  . TOBACCO ABUSE 10/10/2008  . COCAINE ABUSE 08/17/2007  . Essential hypertension 08/17/2007     Current Outpatient Medications on File Prior to Visit  Medication Sig Dispense Refill  .  amLODipine (NORVASC) 10 MG tablet Take 1 tablet (10 mg total) by mouth daily. Must have office visit for refills 90 tablet 2  . benzonatate (TESSALON) 100 MG capsule Take 1 capsule (100 mg total) by mouth 2 (two) times daily as needed for cough. 20 capsule 0  . DULoxetine (CYMBALTA) 20 MG capsule Take 1 capsule (20 mg total) by mouth daily. 30 capsule 3  . levothyroxine (SYNTHROID, LEVOTHROID) 125 MCG tablet TAKE 1 TABLET BY MOUTH DAILY BEFORE BREAKFAST. 30 tablet 5  . losartan-hydrochlorothiazide (HYZAAR) 100-25 MG tablet Take 1 tablet by mouth daily. 90 tablet 2  . meloxicam (MOBIC) 7.5 MG tablet Take 2 tablets (15 mg total) by mouth daily. 30 tablet 0   No current facility-administered medications on file prior to visit.     Allergies  Allergen Reactions  . Penicillins Swelling    Social History   Socioeconomic History  . Marital status: Single    Spouse name: Not on file  . Number of children: Not on file  . Years of education: Not on file  . Highest education level: Not on file  Occupational History  . Not on file  Social Needs  . Financial resource strain: Not on file  . Food insecurity:    Worry: Not on file    Inability: Not on file  . Transportation needs:    Medical: Not  on file    Non-medical: Not on file  Tobacco Use  . Smoking status: Current Some Day Smoker    Packs/day: 0.25    Types: Cigarettes  . Smokeless tobacco: Never Used  . Tobacco comment: 1 cig a day  Substance and Sexual Activity  . Alcohol use: Yes    Alcohol/week: 1.0 - 2.0 standard drinks    Types: 1 - 2 Cans of beer per week    Comment: occ  . Drug use: Yes    Types: Marijuana    Comment: marijuana  . Sexual activity: Not on file  Lifestyle  . Physical activity:    Days per week: Not on file    Minutes per session: Not on file  . Stress: Not on file  Relationships  . Social connections:    Talks on phone: Not on file    Gets together: Not on file    Attends religious service: Not  on file    Active member of club or organization: Not on file    Attends meetings of clubs or organizations: Not on file    Relationship status: Not on file  . Intimate partner violence:    Fear of current or ex partner: Not on file    Emotionally abused: Not on file    Physically abused: Not on file    Forced sexual activity: Not on file  Other Topics Concern  . Not on file  Social History Narrative   Smokes 1 pack/week x 20 years, smokes marijuna, no cocaine in the last year.  Works for [employer].      Financial assistance approved for 100% discount at [facility] and has [facility] card Rudell CobbDeborah Hill Aug 01 2009 8.58am   Financial assistance approved for 100% discount at [facility] and has [facility] card Gavin Poundeborah February 02, 2010 12.20pm    No family history on file.  Past Surgical History:  Procedure Laterality Date  . BTL    . MOUTH SURGERY      ROS: Review of Systems Negative except as stated above. PHYSICAL EXAM: BP (!) 171/141 (BP Location: Right Arm, Cuff Size: Normal) Comment: recheck  Pulse 62   Temp 98.1 F (36.7 C) (Oral)   Resp 16   Wt 185 lb (83.9 kg)   LMP 10/07/2015   SpO2 98%   BMI 25.80 kg/m   Repeat blood pressure 170/120. Physical Exam  General appearance - alert, well appearing, and in no distress Mental status - normal mood, behavior, speech, dress, motor activity, and thought processes Eyes - mild ptosis LT eye Mouthedentulous Chest - clear to auscultation, no wheezes, rales or rhonchi, symmetric air entry Heart - normal rate, regular rhythm, normal S1, S2, no murmurs, rubs, clicks or gallops Musculoskeletal - RT knee -no joint enlargement.  No edema or erythema.  Good range of motion.  Small, movable 2 cm cyst dorsal lateral aspect of both feet Extremities -no edema of the lower legs or ankles at this time.  ASSESSMENT AND PLAN: 1. Essential hypertension Patient has been out of amlodipine for 3 weeks.  I suspect the swelling she was having in the ankles  may have been due to amlodipine.  Instead of restarting that we will put her on labetalol instead.  Continue Cozaar/HCTZ. Follow-up with clinical pharmacist in 2 weeks for recheck of blood pressure. - labetalol (NORMODYNE) 100 MG tablet; Take 1 tablet (100 mg total) by mouth 2 (two) times daily.  Dispense: 60 tablet; Refill: 6 - Comprehensive metabolic  panel - CBC - Lipid panel  2. Postoperative hypothyroidism - TSH  3. Chronic pain of right knee Continue meloxicam.  Add Cymbalta.  I would like to get updated imaging of the right knee since last one was back in 2017 - DG Knee Complete 4 Views Right; Future - DULoxetine (CYMBALTA) 30 MG capsule; Take 1 capsule (30 mg total) by mouth daily.  Dispense: 30 capsule; Refill: 3  4. Ganglion cyst of foot Observe for now.  Can refer if they increase in size or become painful.  Patient was given the opportunity to ask questions.  Patient verbalized understanding of the plan and was able to repeat key elements of the plan.   No orders of the defined types were placed in this encounter.    Requested Prescriptions    No prescriptions requested or ordered in this encounter    No follow-ups on file.  Jonah Blue, MD, FACP

## 2017-11-11 NOTE — Patient Instructions (Addendum)
Give visit with Franky MachoLuke in 2 weeks for repeat blood pressure check.  Start Labetalol 100 mg twice a day for blood pressure.  Start Cymbalta 30 mg daily for knee pain.   [facility] on right knee.  Please let me know once you have been approved for orange card so that I can submit a referral for you to see orthopedics.

## 2017-11-12 ENCOUNTER — Other Ambulatory Visit: Payer: Self-pay | Admitting: Internal Medicine

## 2017-11-12 ENCOUNTER — Telehealth: Payer: Self-pay

## 2017-11-12 DIAGNOSIS — E890 Postprocedural hypothyroidism: Secondary | ICD-10-CM

## 2017-11-12 LAB — CBC
Hematocrit: 41.9 % (ref 34.0–46.6)
Hemoglobin: 13.3 g/dL (ref 11.1–15.9)
MCH: 28.1 pg (ref 26.6–33.0)
MCHC: 31.7 g/dL (ref 31.5–35.7)
MCV: 88 fL (ref 79–97)
Platelets: 250 10*3/uL (ref 150–450)
RBC: 4.74 x10E6/uL (ref 3.77–5.28)
RDW: 13.3 % (ref 12.3–15.4)
WBC: 4.7 10*3/uL (ref 3.4–10.8)

## 2017-11-12 LAB — LIPID PANEL
CHOLESTEROL TOTAL: 146 mg/dL (ref 100–199)
Chol/HDL Ratio: 2.1 ratio (ref 0.0–4.4)
HDL: 68 mg/dL (ref >39)
LDL Calculated: 65 mg/dL (ref 0–99)
TRIGLYCERIDES: 66 mg/dL (ref 0–149)
VLDL Cholesterol Cal: 13 mg/dL (ref 5–40)

## 2017-11-12 LAB — COMPREHENSIVE METABOLIC PANEL
A/G RATIO: 1.2 (ref 1.2–2.2)
ALBUMIN: 4 g/dL (ref 3.5–5.5)
ALT: 10 IU/L (ref 0–32)
AST: 14 IU/L (ref 0–40)
Alkaline Phosphatase: 74 IU/L (ref 39–117)
BUN / CREAT RATIO: 13 (ref 9–23)
BUN: 13 mg/dL (ref 6–24)
Bilirubin Total: 0.3 mg/dL (ref 0.0–1.2)
CO2: 26 mmol/L (ref 20–29)
Calcium: 9.1 mg/dL (ref 8.7–10.2)
Chloride: 101 mmol/L (ref 96–106)
Creatinine, Ser: 1 mg/dL (ref 0.57–1.00)
GFR calc non Af Amer: 64 mL/min/{1.73_m2} (ref >59)
GFR, EST AFRICAN AMERICAN: 74 mL/min/{1.73_m2} (ref >59)
GLOBULIN, TOTAL: 3.4 g/dL (ref 1.5–4.5)
Glucose: 95 mg/dL (ref 65–99)
POTASSIUM: 3.9 mmol/L (ref 3.5–5.2)
SODIUM: 140 mmol/L (ref 134–144)
TOTAL PROTEIN: 7.4 g/dL (ref 6.0–8.5)

## 2017-11-12 LAB — TSH: TSH: 6.25 u[IU]/mL — ABNORMAL HIGH (ref 0.450–4.500)

## 2017-11-12 MED ORDER — LEVOTHYROXINE SODIUM 137 MCG PO TABS: 137.0000 ug | ORAL_TABLET | Freq: Every day | ORAL | 5 refills | Status: DC

## 2017-11-12 NOTE — Telephone Encounter (Signed)
Contacted pt to go over lab results pt is aware and doesn't have any questions or concerns 

## 2017-11-14 ENCOUNTER — Ambulatory Visit: Payer: Self-pay

## 2017-11-17 ENCOUNTER — Ambulatory Visit: Payer: Self-pay | Attending: Family Medicine

## 2017-11-25 ENCOUNTER — Ambulatory Visit: Payer: Self-pay | Attending: Internal Medicine | Admitting: Pharmacist

## 2017-11-25 ENCOUNTER — Encounter: Payer: Self-pay | Admitting: Pharmacist

## 2017-11-25 VITALS — BP 164/83 | HR 59

## 2017-11-25 DIAGNOSIS — M25561 Pain in right knee: Secondary | ICD-10-CM | POA: Insufficient documentation

## 2017-11-25 DIAGNOSIS — M25562 Pain in left knee: Secondary | ICD-10-CM | POA: Insufficient documentation

## 2017-11-25 DIAGNOSIS — F17210 Nicotine dependence, cigarettes, uncomplicated: Secondary | ICD-10-CM | POA: Insufficient documentation

## 2017-11-25 DIAGNOSIS — I10 Essential (primary) hypertension: Secondary | ICD-10-CM | POA: Insufficient documentation

## 2017-11-25 MED FILL — LOSARTAN-HCTZ 100-25 MG TAB: 100-25 | 30 days supply | Qty: 30 | Fill #5

## 2017-11-25 MED FILL — LABETALOL HCL 100 MG TABS: 100 | 30 days supply | Qty: 60 | Fill #0

## 2017-11-25 NOTE — Progress Notes (Signed)
   S:    PCP: Dr. Laural BenesJohnson  Patient arrives in good spirits. Presents to the clinic for hypertension evaluation, counseling, and management. Patient was referred and last seen by Dr. Laural BenesJohnson on 11/11/17. BP was elevated at that visit 171/114. Reported being out of amlodipine x 3 weeks. Dr. Laural BenesJohnson continued losartan-HCTZ and started labetalol 100 mg BID.    Denies CP, SOB, HA, or dizziness. Patient denies adherence with medications. States that she does not have money to get at this time.   Current BP Medications include:   - labetalol 100 mg BID - losartan-HCTZ 100-25 daily  Dietary habits include:  - 1 cup of coffee/1 soda a day - Limits salt  Exercise habits include: - patient does not exercise d/t bilateral lower leg/knee pain  Family / Social history:  - drinks ~40 oz on the weekend - 4 cigarettes a day/states that she smokes more on the weekend   Home BP readings:  - does not check at home  O:  BP in L arm after 5 minutes rest: 164/83, HR 59 Last 3 Office BP readings: BP Readings from Last 3 Encounters:  11/11/17 (!) 171/141  09/03/17 (!) 173/92  06/21/17 (!) 147/82    BMET    Component Value Date/Time   NA 140 11/11/2017 1431   K 3.9 11/11/2017 1431   CL 101 11/11/2017 1431   CO2 26 11/11/2017 1431   GLUCOSE 95 11/11/2017 1431   GLUCOSE 75 05/07/2016 1409   BUN 13 11/11/2017 1431   CREATININE 1.00 11/11/2017 1431   CREATININE 1.11 (H) 05/07/2016 1409   CALCIUM 9.1 11/11/2017 1431   GFRNONAA 64 11/11/2017 1431   GFRNONAA 57 (L) 05/07/2016 1409   GFRAA 74 11/11/2017 1431   GFRAA 66 05/07/2016 1409    Renal function: CrCl cannot be calculated (Unknown ideal weight.).  A/P: Hypertension longstanding diagnosed currently uncontrolled on medications. BP Goal <130/80 mmHg. Patient is not adherent with current medications. Of note, I coordinated with the pharmacy here to make sure patient left with BP medications. Confirmed that [facility]. Will not adjust at this time. Will have patient follow-up with me in 1 month to reassess.   -Continued anti-hypertensives.  -Counseled on lifestyle modifications for blood pressure control including reduced dietary sodium, increased exercise, adequate sleep  Results reviewed and written information provided. Total time in face-to-face counseling 15 minutes.   F/U pharmacist visit for re-check 12/29/17.  Butch PennyLuke Van Ausdall, PharmD, CPP Clinical Pharmacist [facility] [phone]

## 2017-11-25 NOTE — Patient Instructions (Signed)
Thank you for coming to see us today.   Blood pressure today is elevated.  Start taking blood pressure medications as prescribed.   Limiting salt and caffeine, as well as exercising as able for at least 30 minutes for 5 days out of the week, can also help you lower your blood pressure.  Take your blood pressure at home if you are able. Please write down these numbers and bring them to your visits.  If you have any questions about medications, please call me [phone].  Luke.   

## 2017-12-29 ENCOUNTER — Encounter: Payer: Self-pay | Admitting: Pharmacist

## 2017-12-31 ENCOUNTER — Ambulatory Visit (INDEPENDENT_AMBULATORY_CARE_PROVIDER_SITE_OTHER): Payer: Self-pay

## 2017-12-31 ENCOUNTER — Encounter (INDEPENDENT_AMBULATORY_CARE_PROVIDER_SITE_OTHER): Payer: Self-pay | Admitting: Orthopedic Surgery

## 2017-12-31 ENCOUNTER — Ambulatory Visit (INDEPENDENT_AMBULATORY_CARE_PROVIDER_SITE_OTHER): Payer: Self-pay | Admitting: Orthopedic Surgery

## 2017-12-31 DIAGNOSIS — M25562 Pain in left knee: Secondary | ICD-10-CM

## 2017-12-31 DIAGNOSIS — G8929 Other chronic pain: Secondary | ICD-10-CM

## 2017-12-31 DIAGNOSIS — M25561 Pain in right knee: Secondary | ICD-10-CM

## 2018-01-02 ENCOUNTER — Encounter (INDEPENDENT_AMBULATORY_CARE_PROVIDER_SITE_OTHER): Payer: Self-pay | Admitting: Orthopedic Surgery

## 2018-01-02 DIAGNOSIS — M25562 Pain in left knee: Secondary | ICD-10-CM

## 2018-01-02 DIAGNOSIS — M25561 Pain in right knee: Secondary | ICD-10-CM

## 2018-01-02 DIAGNOSIS — G8929 Other chronic pain: Secondary | ICD-10-CM

## 2018-01-02 MED ORDER — LIDOCAINE HCL 1 % IJ SOLN
5.0000 mL | INTRAMUSCULAR | Status: AC | PRN
  Administered 2018-01-02: 5 mL

## 2018-01-02 MED ORDER — BUPIVACAINE HCL 0.25 % IJ SOLN
4.0000 mL | INTRAMUSCULAR | Status: AC | PRN
  Administered 2018-01-02: 4 mL via INTRA_ARTICULAR

## 2018-01-02 MED ORDER — METHYLPREDNISOLONE ACETATE 40 MG/ML IJ SUSP
40.0000 mg | INTRAMUSCULAR | Status: AC | PRN
  Administered 2018-01-02: 40 mg via INTRA_ARTICULAR

## 2018-01-02 NOTE — Progress Notes (Signed)
Office Visit Note   Patient: Ann Jarvis           Date of Birth: February 06, 1966           MRN: [id number] Visit Date: 12/31/2017 Requested by: Marcine Matar, MD [address] [place], [place] [address] PCP: Marcine Matar, MD  Subjective: Chief Complaint  Patient presents with  . Left Knee - Pain  . Right Knee - Pain    HPI: Patient presents with knee pain left worse than right.  She reports pain for several months.  It is getting worse.  Reports constant pain worse at night.  She is tried many things to get relief but nothing helps.  She is tried Ultram without much relief.  It is hard for her to work because of her knee pain.              ROS: All systems reviewed are negative as they relate to the chief complaint within the history of present illness.  Patient denies  fevers or chills.   Assessment & Plan: Visit Diagnoses:  1. Chronic pain of both knees     Plan: Impression is left greater than right knee pain with right worse than left radiographic arthritis.  No effusion and she does not give a great history of having meniscal damage but I think her knee pain is such that its affecting her ability to work.  She is had injections and conservative treatment which have not helped.  I do want to try myself an aspiration and injection of that left knee.  She did have about 10 cc of fluid.  We will inject that left knee and see if she has meniscal pathology versus occult arthritis.  Follow-up after scan  Follow-Up Instructions: Return for after MRI.   Orders:  Orders Placed This Encounter  Procedures  . XR KNEE 3 VIEW RIGHT  . XR KNEE 3 VIEW LEFT  . MR Knee Left w/o contrast   No orders of the defined types were placed in this encounter.     Procedures: Large Joint Inj: L knee on 01/02/2018 7:32 PM Indications: diagnostic evaluation, joint swelling and pain Details: 18 G 1.5 in needle, superolateral approach  Arthrogram: No  Medications: 5 mL lidocaine 1  %; 40 mg methylPREDNISolone acetate 40 MG/ML; 4 mL bupivacaine 0.25 % Outcome: tolerated well, no immediate complications Procedure, treatment alternatives, risks and benefits explained, specific risks discussed. Consent was given by the patient. Immediately prior to procedure a time out was called to verify the correct patient, procedure, equipment, support staff and site/side marked as required. Patient was prepped and draped in the usual sterile fashion.       Clinical Data: No additional findings.  Objective: Vital Signs: LMP 10/07/2015   Physical Exam:   Constitutional: Patient appears well-developed HEENT:  Head: Normocephalic Eyes:EOM are normal Neck: Normal range of motion Cardiovascular: Normal rate Pulmonary/chest: Effort normal Neurologic: Patient is alert Skin: Skin is warm Psychiatric: Patient has normal mood and affect    Ortho Exam: Ortho exam demonstrates trace effusion left knee no effusion right knee.  Collateral crucial ligaments are stable.  Alignment intact.  Pedal pulses palpable.  Extensor mechanism intact.  No masses lymph adenopathy or skin changes noted in that left knee region.  She does have medial and lateral joint line tenderness in the left knee compared to the right.  Specialty Comments:  No specialty comments available.  Imaging: No results found.   PMFS History:  Patient Active Problem List   Diagnosis Date Noted  . Hot flashes 03/13/2017  . Chronic pain of right knee 12/12/2016  . Chronic pain of left ankle 12/12/2016  . 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 Hypothyroidism 11/13/2015  . Bell's palsy 10/09/2011  . TOBACCO ABUSE 10/10/2008  . COCAINE ABUSE 08/17/2007  . Essential hypertension 08/17/2007   Past Medical History:  Diagnosis Date  . Cocaine abuse (HCC)   . Grave's disease    s/p radioiodine ablation 03/05/10, F/B Dr. Chestine Spore. On Methimazole  . Hypertension   . Hyperthyroidism   . Scabies   . Thyroid condition     . Tobacco abuse     History reviewed. No pertinent family history.  Past Surgical History:  Procedure Laterality Date  . BTL    . MOUTH SURGERY     Social History   Occupational History  . Not on file  Tobacco Use  . Smoking status: Current Some Day Smoker    Packs/day: 0.25    Types: Cigarettes  . Smokeless tobacco: Never Used  . Tobacco comment: 1 cig a day  Substance and Sexual Activity  . Alcohol use: Yes    Alcohol/week: 1.0 - 2.0 standard drinks    Types: 1 - 2 Cans of beer per week    Comment: occ  . Drug use: Yes    Types: Marijuana    Comment: marijuana  . Sexual activity: Not on file

## 2018-01-06 ENCOUNTER — Ambulatory Visit: Payer: Self-pay | Admitting: Internal Medicine

## 2018-01-06 ENCOUNTER — Encounter: Payer: Self-pay | Admitting: Pharmacist

## 2018-01-20 ENCOUNTER — Telehealth: Payer: Self-pay | Admitting: Pharmacist

## 2018-01-20 MED FILL — LOSARTAN-HCTZ 100-25 MG TAB: 100-25 | 30 days supply | Qty: 30 | Fill #6

## 2018-01-20 NOTE — Telephone Encounter (Signed)
Rec notice from pharmacy that they require an NDC change for patient's levothyroxine. Pt is amenable to change. Will forward information to patient's PCP. Recommend f/u levels 6-8 weeks.

## 2018-01-21 NOTE — Telephone Encounter (Signed)
Pt has f/u appt with me the middle of next mth.  Will plan to recheck TSH.

## 2018-01-28 ENCOUNTER — Encounter (INDEPENDENT_AMBULATORY_CARE_PROVIDER_SITE_OTHER): Payer: Self-pay | Admitting: *Deleted

## 2018-01-29 ENCOUNTER — Emergency Department (HOSPITAL_COMMUNITY): Payer: Self-pay

## 2018-01-29 ENCOUNTER — Emergency Department (HOSPITAL_COMMUNITY)
Admission: EM | Admit: 2018-01-29 | Discharge: 2018-01-29 | Disposition: A | Discharge status: Home / Self Care | Payer: Self-pay | Attending: Emergency Medicine | Admitting: Emergency Medicine

## 2018-01-29 ENCOUNTER — Encounter (HOSPITAL_COMMUNITY): Payer: Self-pay | Admitting: Emergency Medicine

## 2018-01-29 DIAGNOSIS — I10 Essential (primary) hypertension: Secondary | ICD-10-CM | POA: Insufficient documentation

## 2018-01-29 DIAGNOSIS — F17210 Nicotine dependence, cigarettes, uncomplicated: Secondary | ICD-10-CM | POA: Insufficient documentation

## 2018-01-29 DIAGNOSIS — M25562 Pain in left knee: Secondary | ICD-10-CM | POA: Insufficient documentation

## 2018-01-29 DIAGNOSIS — Z79899 Other long term (current) drug therapy: Secondary | ICD-10-CM | POA: Insufficient documentation

## 2018-01-29 DIAGNOSIS — G8929 Other chronic pain: Secondary | ICD-10-CM | POA: Insufficient documentation

## 2018-01-29 DIAGNOSIS — E0590 Thyrotoxicosis, unspecified without thyrotoxic crisis or storm: Secondary | ICD-10-CM | POA: Insufficient documentation

## 2018-01-29 MED ORDER — NAPROXEN 375 MG PO TABS
375.0000 mg | ORAL_TABLET | Freq: Once | ORAL | Status: AC
  Administered 2018-01-29: 375 mg via ORAL
  Filled 2018-01-29: qty 1

## 2018-01-29 NOTE — Discharge Instructions (Signed)
Please return to the Emergency Department for any new or worsening symptoms or if your symptoms do not improve. Please be sure to follow up with your Primary Care Physician as soon as possible regarding your visit today. If you do not have a Primary Doctor please use the resources below to establish one. Your x-ray today did not show fracture or dislocation of your knee.  It did show some chronic arthritic changes to the knee.  It is still possible that there may be a ligament and/or tendon injury to the knee and it is important to follow-up with your orthopedic specialist as soon as possible for further evaluation of your knee pain. You may use a knee sleeve provided for comfort.  You may use the crutches provided for comfort.  Please try to keep weight off of the extremity.  Please use rest, ice and elevation to help with your knee pain.  You may use over-the-counter anti-inflammatories such as Tylenol or ibuprofen as directed on packaging to help with your pain.  Contact a doctor if: The pain does not stop. The pain changes or gets worse. You have a fever along with knee pain. Your knee gives out or locks up. Your knee swells, and becomes worse. Get help right away if: Your knee feels warm. You cannot move your knee. You have very bad knee pain. You have chest pain. You have trouble breathing.  Do not take your medicine if  develop an itchy rash, swelling in your mouth or lips, or difficulty breathing.   RESOURCE GUIDE  Chronic Pain Problems: Contact [facility]  [phone] Patients need to be referred by their primary care doctor.  Insufficient Money for Medicine: Contact United Way:  call "211" or [facility] 415-845-5212.  No Primary Care Doctor: Call Health Connect  [phone] - can help you locate a primary care doctor that  accepts your insurance, provides certain services, etc. Physician Referral Service- [phone]  Agencies that provide  inexpensive medical care: [facility]  [phone] [facility]  [phone] [facility]  [phone] [facility]  [phone] [facility]  [phone] [facility]  [phone]  Medicaid-accepting [facility] Providers: [facility]- [address]  [phone], Mon-Fri 9am-7pm, Sat 9am-1pm [facility]- [address] [place], [address] [place]  [phone] [facility]- [address] [place]  [phone] [facility]- [address]  [phone] Renaye Rakers- [address] [place], [address], [phone]  Only accepts [place] patients after they have their name  applied to their card  Self Pay (no insurance) in [facility]: Sickle Cell Patients: Dr Willey Blade, [facility]  [address] [place], [phone] [facility] Care- [address] [place]  [phone]       [facility]- [address]       -     [facility]- see information above (Speak to [employer] if you do not have insurance)       -  [facility]- [address] [place], [phone]       -  [facility]- [address],  [phone]       -  [facility]- [address], [phone]       -  Dr Julio Sicks-  [address], [place], [phone]       -  [facility]- [address], [phone]       -  [facility]- [address] [phone], also [address] [phone]       -    [facility]- [address] [place], [phone], 1st & 3rd Saturday   every month, 10am-1pm  1) Find a Doctor and Pay Out of Pocket Although you won't have to find out who is covered by your insurance plan, it is a good idea to ask around and get recommendations. You will then need to call the office and see if the doctor you have chosen will accept you as a new  patient and what types of options they offer for patients who are self-pay. Some doctors offer discounts or will set up payment plans for their patients who do not have insurance, but you will need to ask so you aren't surprised when you get to your appointment.  2) Contact Your Local Health Department Not all health departments have doctors that can see patients for sick visits, but many do, so it is worth a call to see if yours does. If you don't know where your local health department is, you can check in your phone book. The CDC also has a tool to help you locate your state's health department, and many state websites also have listings of all of their local health departments.  3) Find a Walk-in Clinic If your illness is not likely to be very severe or complicated, you may want to try a walk in clinic. These are popping up all over the country in pharmacies, drugstores, and shopping centers. They're usually staffed by nurse practitioners or physician assistants that have been trained to treat common illnesses and complaints. They're usually fairly quick and inexpensive. However, if you have serious medical issues or chronic medical problems, these are probably not your best option  STD Testing [facility] Department of [facility], STD Clinic, [address], [place], phone [phone] or [phone].  Monday - Friday, call for an appointment. [facility] Department of [employer], STD Clinic, [place] [address], [place], phone [phone] or [phone].  Monday - Friday, call for an appointment.  Abuse/Neglect: [facility] Child Abuse Hotline 405-568-3850 [facility] Child Abuse Hotline (831) 846-2326 (After Hours)  Emergency Shelter:  [facility] 512-201-1752  Maternity Homes: Room at the [place] [facility] 918-864-9884 [facility] [phone]  MRSA Hotline #:   986-606-7421  [facility]  Resources  [facility] [place]  [employer] [facility] Dept. [address]                 [address]         [address] [place] [address]  [place]                                               Cristobal Goldmann Phone:  [phone]  Phone:  [phone]                   Phone:  [place], [place]- [phone]       -     [facility] in [place], [address],                                  [place] [phone] or [phone] (After Hours)   [place]  Substance Abuse Resources: Alcohol and Drug Services  [phone] [place] 949-035-9620 [facility] [place] Chinita Pester [phone] Residential & Outpatient Substance Abuse Program  5486398159  Psychological Services: [place]  [phone] [place]  [place], [place]. [address], [place], [place]: [phone] or [phone], [employer]uy  Dental Assistance  If unable to pay or uninsured, contact:  [facility] or [facility]. to become qualified for the adult dental clinic.  Patients with Medicaid: [facility] [phone] [address], [place] [address], [phone]  If unable to pay, or uninsured, contact [facility] [phone])[phone]) or [place] [phone] in [place], [place] in [facility]) to become qualified for the adult dental clinic   Other [place]- [place], [place], [place], [address], [place], [place], 2nd and 4th Thursday of the month at 6:30am.  10 clients each day by appointment, can sometimes  see walk-in patients if someone does not show for an appointment. [facility]- [address] Hillard Danker [place], [place], [address], [place], [place], [place], [address], [place] Department- [phone] [facility] [phone]

## 2018-01-29 NOTE — ED Notes (Signed)
Bed: WTR7 Expected date:  Expected time:  Means of arrival:  Comments: 

## 2018-01-29 NOTE — ED Provider Notes (Signed)
[facility]-EMERGENCY DEPT Provider Note   CSN: [id number] Arrival date & time: 01/29/18  1018     History   Chief Complaint Chief Complaint  Patient presents with  . Knee Pain    HPI Ann Jarvis is a 52 y.o. female presenting for left knee pain.  Patient states that she has a history of chronic knee pain and receives injections at her orthopedic office.  Patient states that she was walking yesterday and missed a step landing with her left knee fully extended.  Patient states that she had a sudden onset sharp severe pain that has been constant since that time.  Patient states that she has been ambulatory however with increased pain since yesterday.  Patient also endorses a mild amount of swelling around the left knee.  Patient denies history of fevers, color change to the skin, nausea/vomiting, streaking.  Patient denies any other injuries from yesterday.  HPI  Past Medical History:  Diagnosis Date  . Cocaine abuse (HCC)   . Grave's disease    s/p radioiodine ablation 03/05/10, F/B Dr. Chestine Spore. On Methimazole  . Hypertension   . Hyperthyroidism   . Scabies   . Thyroid condition   . Tobacco abuse     Patient Active Problem List   Diagnosis Date Noted  . Hot flashes 03/13/2017  . Chronic pain of right knee 12/12/2016  . Chronic pain of left ankle 12/12/2016  . 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 Hypothyroidism 11/13/2015  . Bell's palsy 10/09/2011  . TOBACCO ABUSE 10/10/2008  . COCAINE ABUSE 08/17/2007  . Essential hypertension 08/17/2007    Past Surgical History:  Procedure Laterality Date  . BTL    . MOUTH SURGERY       OB History   None      Home Medications    Prior to Admission medications   Medication Sig Start Date End Date Taking? Authorizing Provider  DULoxetine (CYMBALTA) 30 MG capsule Take 1 capsule (30 mg total) by mouth daily. 11/11/17   Marcine Matar, MD  labetalol (NORMODYNE) 100 MG tablet Take 1 tablet  (100 mg total) by mouth 2 (two) times daily. 11/11/17   Marcine Matar, MD  levothyroxine (SYNTHROID, LEVOTHROID) 137 MCG tablet Take 1 tablet (137 mcg total) by mouth daily before breakfast. 11/12/17   Marcine Matar, MD  losartan-hydrochlorothiazide (HYZAAR) 100-25 MG tablet Take 1 tablet by mouth daily. 04/03/17   Marcine Matar, MD  meloxicam (MOBIC) 7.5 MG tablet Take 2 tablets (15 mg total) by mouth daily. 09/03/17   Eber Hong, MD    Family History No family history on file.  Social History Social History   Tobacco Use  . Smoking status: Current Some Day Smoker    Packs/day: 0.25    Types: Cigarettes  . Smokeless tobacco: Never Used  . Tobacco comment: 1 cig a day  Substance Use Topics  . Alcohol use: Yes    Alcohol/week: 1.0 - 2.0 standard drinks    Types: 1 - 2 Cans of beer per week    Comment: occ  . Drug use: Yes    Types: Marijuana    Comment: marijuana     Allergies   Penicillins   Review of Systems Review of Systems  Constitutional: Negative.  Negative for chills, diaphoresis and fever.  Musculoskeletal: Positive for arthralgias and joint swelling. Negative for back pain, myalgias and neck pain.  Skin: Negative.  Negative for color change, rash and wound.  Neurological: Negative.  Negative for weakness and numbness.   Physical Exam Updated Vital Signs BP (!) 148/89 (BP Location: Right Arm)   Pulse 67   Temp 98.3 F (36.8 C) (Oral)   Resp 18   LMP 10/07/2015   SpO2 97%   Physical Exam  Constitutional: She appears well-developed and well-nourished. No distress.  HENT:  Head: Normocephalic and atraumatic.  Right Ear: External ear normal.  Left Ear: External ear normal.  Nose: Nose normal.  Eyes: Pupils are equal, round, and reactive to light. EOM are normal.  Neck: Trachea normal and normal range of motion. No tracheal deviation present.  Cardiovascular: Normal rate and regular rhythm.  Pulses:      Dorsalis pedis pulses are 2+ on the  right side, and 2+ on the left side.       Posterior tibial pulses are 2+ on the right side, and 2+ on the left side.  Pulmonary/Chest: Effort normal. No respiratory distress.  Abdominal: Soft. There is no tenderness. There is no rebound and no guarding.  Musculoskeletal: Normal range of motion.       Right hip: Normal.       Left hip: Normal.       Right knee: Normal.       Left knee: She exhibits swelling. She exhibits normal range of motion, no ecchymosis, no deformity and no erythema. Tenderness found.       Right ankle: Normal.       Left ankle: Normal.       Left upper leg: Normal.       Right lower leg: Normal.       Left lower leg: Normal.       Right foot: Normal.       Left foot: Normal.  Left Knee:   Appearance normal, possible minimal swelling present. No obvious deformity. No skin swelling, erythema, heat, fluctuance or break of the skin.  Patient with diffuse anterior knee tenderness. Active and passive flexion and extension intact with increased pain however withoutcrepitus. Negative anterior/poster drawer bilaterally. No varus or valgus laxity or locking. No tenderness to palpation of hips or ankles.Compartments soft. Neurovascularly intact distally to site of injury.  Neurological: She is alert. GCS eye subscore is 4. GCS verbal subscore is 5. GCS motor subscore is 6.  Speech is clear and goal oriented, follows commands Major Cranial nerves without deficit, no facial droop Normal strength in upper and lower extremities bilaterally including dorsiflexion and plantar flexion, strong and equal grip strength Sensation normal to light touch Moves extremities without ataxia, coordination intact Slightly limping gait the patient attributes to her left knee pain  Skin: Skin is warm and dry. Capillary refill takes less than 2 seconds.  Psychiatric: She has a normal mood and affect. Her behavior is normal.   ED Treatments / Results  Labs (all labs ordered are listed, but only  abnormal results are displayed) Labs Reviewed - No data to display  EKG None  Radiology Dg Knee Complete 4 Views Left  Result Date: 01/29/2018 CLINICAL DATA:  Left knee injection.  Pain. EXAM: LEFT KNEE - COMPLETE 4+ VIEW COMPARISON:  12/31/2017. FINDINGS: Mild lateral compartment degenerative change. No acute bony abnormality identified. Knee joint effusion cannot be excluded. If clinical concern MRI can be obtained. IMPRESSION: 1. Mild lateral compartment degenerative change. No acute bony abnormality. 2. Knee joint effusion cannot be excluded. If concern persists MRI of the left knee can be obtained to further evaluate. Electronically Signed   By: Maisie Fus  Register   On: 01/29/2018 11:15    Procedures Procedures (including critical care time)  Medications Ordered in ED Medications  naproxen (NAPROSYN) tablet 375 mg (has no administration in time range)     Initial Impression / Assessment and Plan / ED Course  I have reviewed the triage vital signs and the nursing notes.  Pertinent labs & imaging results that were available during my care of the patient were reviewed by me and considered in my medical decision making (see chart for details).    52 year old patient presenting with acute on chronic left knee pain after missing a step and landing with locked out left knee.  No obvious deformity or sign of injury on examination today.  Intact patellar tendon.  No obvious ligament injury however patient is reluctant to move the joint due to pain. Imaging today with degenerative changes however no acute abnormality. No sign of septic arthritis, cellulitis, DVT or neurovascular compromise.  Patient is neurovascularly intact to bilateral lower extremities. Patient is ambulatory without assistance in the emergency department.  Patient has been given crutches to assist with ambulation.  Patient has been given knee sleeve for comfort.  Patient has been urged to use rest, ice and elevation to  help with pain.    Patient informed that despite normal xray that ligamentous/tendon injury may still be present in that prompt orthopedic follow-up is recommended for further evaluation.  Afebrile, not tachycardic, not hypotensive, well-appearing and in no acute distress.  At this time there does not appear to be any evidence of an acute emergency medical condition and the patient appears stable for discharge with appropriate outpatient follow up. Diagnosis was discussed with patient who verbalizes understanding of care plan and is agreeable to discharge. I have discussed return precautions with patient who verbalizes understanding of return precautions. Patient strongly encouraged to follow-up with their PCP. All questions answered.  Patient's case discussed and radiology reviewed with Dr. Estell Harpin who agrees with plan to discharge with follow-up.     Note: Portions of this report may have been transcribed using voice recognition software. Every effort was made to ensure accuracy; however, inadvertent computerized transcription errors may still be present.   Final Clinical Impressions(s) / ED Diagnoses   Final diagnoses:  Left knee pain, unspecified chronicity    ED Discharge Orders    None       Elizabeth Palau 01/29/18 1301    Bethann Berkshire, MD 01/29/18 1330

## 2018-01-29 NOTE — ED Triage Notes (Addendum)
Per [facility] pt from home for left knee pain after missing a step yesterday. Reports hurts worse to bear weight. Had injection in left knee 2 weeks ago.  Vitals: 140/90, 70HR, 100%, 20R.

## 2018-02-12 ENCOUNTER — Ambulatory Visit: Payer: Self-pay | Admitting: Internal Medicine

## 2018-02-13 ENCOUNTER — Other Ambulatory Visit: Payer: Self-pay

## 2018-02-19 ENCOUNTER — Ambulatory Visit
Admission: RE | Admit: 2018-02-19 | Discharge: 2018-02-19 | Disposition: A | Discharge status: Home / Self Care | Payer: No Typology Code available for payment source | Source: Ambulatory Visit | Attending: Orthopedic Surgery | Admitting: Orthopedic Surgery

## 2018-02-19 DIAGNOSIS — M25561 Pain in right knee: Principal | ICD-10-CM

## 2018-02-19 DIAGNOSIS — M25562 Pain in left knee: Principal | ICD-10-CM

## 2018-02-19 DIAGNOSIS — G8929 Other chronic pain: Secondary | ICD-10-CM

## 2018-02-25 ENCOUNTER — Encounter (INDEPENDENT_AMBULATORY_CARE_PROVIDER_SITE_OTHER): Payer: Self-pay | Admitting: Orthopedic Surgery

## 2018-02-25 ENCOUNTER — Ambulatory Visit (INDEPENDENT_AMBULATORY_CARE_PROVIDER_SITE_OTHER): Payer: Self-pay | Admitting: Orthopedic Surgery

## 2018-02-25 DIAGNOSIS — M1712 Unilateral primary osteoarthritis, left knee: Secondary | ICD-10-CM

## 2018-02-25 MED ORDER — CELECOXIB 200 MG PO CAPS: 200.0000 mg | ORAL_CAPSULE | Freq: Two times a day (BID) | ORAL | 0 refills | Status: DC

## 2018-02-26 ENCOUNTER — Encounter (INDEPENDENT_AMBULATORY_CARE_PROVIDER_SITE_OTHER): Payer: Self-pay | Admitting: Orthopedic Surgery

## 2018-02-26 NOTE — Progress Notes (Signed)
Office Visit Note   Patient: Ann Jarvis           Date of Birth: [birth year]           MRN: [id number] Visit Date: 02/25/2018 Requested by: Marcine Matar, MD [address] [place], [place] [address] PCP: Marcine Matar, MD  Subjective: Chief Complaint  Patient presents with  . Left Knee - Follow-up    MRI Left Knee Review    HPI: Patient presents for evaluation and review of MRI scan left knee.  She does have lateral meniscal tear and stress fracture as well as that lateral femoral condyle.  Ibuprofen and Tylenol not a lot of relief.  She also has a known history of right knee arthritis and that knee is popping.  Injury 01/29/2018 which she missed a step and landed on her left knee with a fully extended.              ROS: All systems reviewed are negative as they relate to the chief complaint within the history of present illness.  Patient denies  fevers or chills.   Assessment & Plan: Visit Diagnoses:  1. Unilateral primary osteoarthritis, left knee     Plan: Impression is left knee stress type reaction with lateral meniscal tear.  Patient has had injections in the past and they have not helped.  Going to try a neoprene sleeve on the right and Celebrex for that left knee.  Come back in 8 weeks if she is not any better and will consider other types of intervention.  Ultimately the patient would be on crutches and she has crutches at home.  That would allow this hairline stress reaction to heal quicker.  I do not think she will use her crutches however.  Follow-Up Instructions: Return if symptoms worsen or fail to improve.   Orders:  No orders of the defined types were placed in this encounter.  Meds ordered this encounter  Medications  . celecoxib (CELEBREX) 200 MG capsule    Sig: Take 1 capsule (200 mg total) by mouth 2 (two) times daily.    Dispense:  120 capsule    Refill:  0      Procedures: No procedures performed   Clinical Data: No additional  findings.  Objective: Vital Signs: LMP 10/07/2015   Physical Exam:   Constitutional: Patient appears well-developed HEENT:  Head: Normocephalic Eyes:EOM are normal Neck: Normal range of motion Cardiovascular: Normal rate Pulmonary/chest: Effort normal Neurologic: Patient is alert Skin: Skin is warm Psychiatric: Patient has normal mood and affect    Ortho Exam: Ortho exam demonstrates full active and passive range of motion of the left knee.  Effusion is present.  There is some lateral joint line tenderness.  Collateral crucial ligaments are stable.  No posterior lateral rotatory instability is noted.  Pedal pulses palpable.  Right knee demonstrates slight flexion contracture with effusion and some medial joint line tenderness but otherwise intact.  No groin pain with internal and external rotation of either leg.  Specialty Comments:  No specialty comments available.  Imaging: No results found.   PMFS History: Patient Active Problem List   Diagnosis Date Noted  . Hot flashes 03/13/2017  . Chronic pain of right knee 12/12/2016  . Chronic pain of left ankle 12/12/2016  . 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 Hypothyroidism 11/13/2015  . Bell's palsy 10/09/2011  . TOBACCO ABUSE 10/10/2008  . COCAINE ABUSE 08/17/2007  . Essential hypertension 08/17/2007   Past  Medical History:  Diagnosis Date  . Cocaine abuse (HCC)   . Grave's disease    s/p radioiodine ablation 03/05/10, F/B Dr. Chestine Sporelark. On Methimazole  . Hypertension   . Hyperthyroidism   . Scabies   . Thyroid condition   . Tobacco abuse     History reviewed. No pertinent family history.  Past Surgical History:  Procedure Laterality Date  . BTL    . MOUTH SURGERY     Social History   Occupational History  . Not on file  Tobacco Use  . Smoking status: Current Some Day Smoker    Packs/day: 0.25    Types: Cigarettes  . Smokeless tobacco: Never Used  . Tobacco comment: 1 cig a day  Substance and Sexual  Activity  . Alcohol use: Yes    Alcohol/week: 1.0 - 2.0 standard drinks    Types: 1 - 2 Cans of beer per week    Comment: occ  . Drug use: Yes    Types: Marijuana    Comment: marijuana  . Sexual activity: Not on file

## 2018-03-26 MED FILL — LEVOTHYROXINE 137 MCG TAB: 137 | 30 days supply | Qty: 30 | Fill #0

## 2018-03-26 MED FILL — LOSARTAN-HCTZ 100-25 MG TAB: 100-25 | 30 days supply | Qty: 30 | Fill #7

## 2018-04-09 ENCOUNTER — Ambulatory Visit: Payer: No Typology Code available for payment source | Attending: Internal Medicine | Admitting: Internal Medicine

## 2018-04-09 ENCOUNTER — Encounter: Payer: Self-pay | Admitting: Internal Medicine

## 2018-04-09 VITALS — BP 192/112 | HR 64 | Temp 97.6°F | Resp 16 | Ht 71.0 in | Wt 191.2 lb

## 2018-04-09 DIAGNOSIS — Z79890 Hormone replacement therapy (postmenopausal): Secondary | ICD-10-CM | POA: Insufficient documentation

## 2018-04-09 DIAGNOSIS — S83282A Other tear of lateral meniscus, current injury, left knee, initial encounter: Secondary | ICD-10-CM | POA: Insufficient documentation

## 2018-04-09 DIAGNOSIS — I10 Essential (primary) hypertension: Secondary | ICD-10-CM

## 2018-04-09 DIAGNOSIS — X58XXXA Exposure to other specified factors, initial encounter: Secondary | ICD-10-CM | POA: Insufficient documentation

## 2018-04-09 DIAGNOSIS — F17210 Nicotine dependence, cigarettes, uncomplicated: Secondary | ICD-10-CM | POA: Insufficient documentation

## 2018-04-09 DIAGNOSIS — I129 Hypertensive chronic kidney disease with stage 1 through stage 4 chronic kidney disease, or unspecified chronic kidney disease: Secondary | ICD-10-CM | POA: Insufficient documentation

## 2018-04-09 DIAGNOSIS — E0500 Thyrotoxicosis with diffuse goiter without thyrotoxic crisis or storm: Secondary | ICD-10-CM | POA: Insufficient documentation

## 2018-04-09 DIAGNOSIS — E890 Postprocedural hypothyroidism: Secondary | ICD-10-CM | POA: Insufficient documentation

## 2018-04-09 DIAGNOSIS — Z880 Allergy status to penicillin: Secondary | ICD-10-CM | POA: Insufficient documentation

## 2018-04-09 DIAGNOSIS — N183 Chronic kidney disease, stage 3 (moderate): Secondary | ICD-10-CM | POA: Insufficient documentation

## 2018-04-09 DIAGNOSIS — Z791 Long term (current) use of non-steroidal anti-inflammatories (NSAID): Secondary | ICD-10-CM | POA: Insufficient documentation

## 2018-04-09 DIAGNOSIS — Z79899 Other long term (current) drug therapy: Secondary | ICD-10-CM | POA: Insufficient documentation

## 2018-04-09 MED ORDER — LOSARTAN POTASSIUM-HCTZ 100-25 MG PO TABS: 1.0000 | ORAL_TABLET | Freq: Every day | ORAL | 6 refills | Status: DC

## 2018-04-09 MED ORDER — LABETALOL HCL 100 MG PO TABS: 100.0000 mg | ORAL_TABLET | Freq: Two times a day (BID) | ORAL | 6 refills | Status: DC

## 2018-04-09 MED ORDER — CELECOXIB 200 MG PO CAPS: 200.0000 mg | ORAL_CAPSULE | Freq: Two times a day (BID) | ORAL | 1 refills | Status: DC

## 2018-04-09 MED ORDER — LEVOTHYROXINE SODIUM 137 MCG PO TABS: 137.0000 ug | ORAL_TABLET | Freq: Every day | ORAL | 5 refills | Status: DC

## 2018-04-09 MED FILL — LOSARTAN-HCTZ 100-25 MG TAB: 100-25 | 30 days supply | Qty: 30 | Fill #0

## 2018-04-09 MED FILL — LABETALOL HCL 100 MG TABS: 100 | 30 days supply | Qty: 60 | Fill #0

## 2018-04-09 MED FILL — CELECOXIB 200 MG CAP: 200 | 30 days supply | Qty: 60 | Fill #0

## 2018-04-09 NOTE — Progress Notes (Signed)
Patient ID: Ann Linerhilistine Jarvis, female    DOB: 07-Mar-1966  MRN: [id number]  CC: Hypertension   Subjective: Ann Jarvis is a 53 y.o. female who presents for chronic ds management Her concerns today include:  Hx HTN, Tob, Hypothyroid post treatment for Graves' disease, cocaine abuse, CKD stage 3(last GFR improved to 66 in 05/2016).  HTN: Losartan/HCTZ and labetalol for a few months.  She attributes this to limited finances.  She states that she has not been able to work for the past few months after injuring her left knee.  Denies any chest pains or shortness of breath.    LT knee pain: Apparently injured the left knee when she accidentally fell the end of October.  She was seen in the emergency room for the same.  Referred to orthopedic Dr. August Saucerean and was assessed to have arthritis and torn lateral meniscus.  Patient was given a neoprene sleeve and Celebrex.  She was told to follow-up in 8 weeks.  She reports was healing good but twisted the knee a week or 2 ago when getting in car.    Tobacco dependence: Reports she smokes fewer than 5 cigarettes a week. Patient Active Problem List   Diagnosis Date Noted  . Hot flashes 03/13/2017  . Chronic pain of right knee 12/12/2016  . Chronic pain of left ankle 12/12/2016  . 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 Hypothyroidism 11/13/2015  . Bell's palsy 10/09/2011  . TOBACCO ABUSE 10/10/2008  . COCAINE ABUSE 08/17/2007  . Essential hypertension 08/17/2007     Current Outpatient Medications on File Prior to Visit  Medication Sig Dispense Refill  . celecoxib (CELEBREX) 200 MG capsule Take 1 capsule (200 mg total) by mouth 2 (two) times daily. 120 capsule 0  . DULoxetine (CYMBALTA) 30 MG capsule Take 1 capsule (30 mg total) by mouth daily. 30 capsule 3  . labetalol (NORMODYNE) 100 MG tablet Take 1 tablet (100 mg total) by mouth 2 (two) times daily. 60 tablet 6  . levothyroxine (SYNTHROID, LEVOTHROID) 137 MCG tablet Take 1 tablet (137 mcg  total) by mouth daily before breakfast. 30 tablet 5  . losartan-hydrochlorothiazide (HYZAAR) 100-25 MG tablet Take 1 tablet by mouth daily. 90 tablet 2  . meloxicam (MOBIC) 7.5 MG tablet Take 2 tablets (15 mg total) by mouth daily. (Patient not taking: Reported on 02/25/2018) 30 tablet 0   No current facility-administered medications on file prior to visit.     Allergies  Allergen Reactions  . Penicillins Swelling    Social History   Socioeconomic History  . Marital status: Single    Spouse name: Not on file  . Number of children: Not on file  . Years of education: Not on file  . Highest education level: Not on file  Occupational History  . Not on file  Social Needs  . Financial resource strain: Not on file  . Food insecurity:    Worry: Not on file    Inability: Not on file  . Transportation needs:    Medical: Not on file    Non-medical: Not on file  Tobacco Use  . Smoking status: Current Some Day Smoker    Packs/day: 0.25    Types: Cigarettes  . Smokeless tobacco: Never Used  . Tobacco comment: 1 cig a day  Substance and Sexual Activity  . Alcohol use: Yes    Alcohol/week: 1.0 - 2.0 standard drinks    Types: 1 - 2 Cans of beer per week  Comment: occ  . Drug use: Yes    Types: Marijuana    Comment: marijuana  . Sexual activity: Not on file  Lifestyle  . Physical activity:    Days per week: Not on file    Minutes per session: Not on file  . Stress: Not on file  Relationships  . Social connections:    Talks on phone: Not on file    Gets together: Not on file    Attends religious service: Not on file    Active member of club or organization: Not on file    Attends meetings of clubs or organizations: Not on file    Relationship status: Not on file  . Intimate partner violence:    Fear of current or ex partner: Not on file    Emotionally abused: Not on file    Physically abused: Not on file    Forced sexual activity: Not on file  Other Topics Concern  .  Not on file  Social History Narrative   Smokes 1 pack/week x 20 years, smokes marijuna, no cocaine in the last year.  Works for [employer].      Financial assistance approved for 100% discount at [facility] and has [facility] card Rudell Cobb Aug 01 2009 8.58am   Financial assistance approved for 100% discount at [facility] and has [facility] card Gavin Pound February 02, 2010 12.20pm    No family history on file.  Past Surgical History:  Procedure Laterality Date  . BTL    . MOUTH SURGERY      ROS: Review of Systems Negative except as above PHYSICAL EXAM: BP (!) 192/112 (Cuff Size: Normal) Comment: recheck  Pulse 64   Temp 97.6 F (36.4 C) (Oral)   Resp 16   Ht 5\' 11"  (1.803 m)   Wt 191 lb 3.2 oz (86.7 kg)   LMP 10/07/2015   SpO2 100%   BMI 26.67 kg/m   Physical Exam  General appearance - alert, well appearing, and in no distress Mental status - normal mood, behavior, speech, dress, motor activity, and thought processes Chest - clear to auscultation, no wheezes, rales or rhonchi, symmetric air entry Heart - normal rate, regular rhythm, normal S1, S2, no murmurs, rubs, clicks or gallops Musculoskeletal -left knee: Mild edema compared to the left knee.  Mild discomfort with passive range of motion. Extremities -lower extremity edema.   ASSESSMENT AND PLAN: 1. Essential hypertension Not at goal.  Advised patient to speak with the pharmacy to see whether they can credit her medication until she is able to pay.  Also sent a message to our social worker - losartan-hydrochlorothiazide (HYZAAR) 100-25 MG tablet; Take 1 tablet by mouth daily.  Dispense: 30 tablet; Refill: 6 - labetalol (NORMODYNE) 100 MG tablet; Take 1 tablet (100 mg total) by mouth 2 (two) times daily.  Dispense: 60 tablet; Refill: 6  2. Postoperative hypothyroidism - levothyroxine (SYNTHROID, LEVOTHROID) 137 MCG tablet; Take 1 tablet (137 mcg total) by mouth daily before breakfast.  Dispense: 30 tablet; Refill: 5  3. Acute  lateral meniscus tear of left knee, initial encounter Stop meloxicam.  Refill Celebrex.  Keep follow-up appointment with Dr. August Saucer.    Patient was given the opportunity to ask questions.  Patient verbalized understanding of the plan and was able to repeat key elements of the plan.   No orders of the defined types were placed in this encounter.    Requested Prescriptions    No prescriptions requested or ordered in this  encounter    No follow-ups on file.  Karle Plumber, MD, FACP

## 2018-04-09 NOTE — Progress Notes (Signed)
Pt states she has not taken her bp medicine today  Spoke with Joni Reining in the pharmacy and she stated pt labetalol 100mg  had expired and has not been filled since 01/20/18 and her losartan has not been filled since 11/25/17

## 2018-04-15 ENCOUNTER — Telehealth: Payer: Self-pay | Admitting: Licensed Clinical Social Worker

## 2018-04-15 NOTE — Telephone Encounter (Signed)
LCSWA placed call to patient to inquire about psychosocial stressors. Patient stated that she has difficulty affording her medications. She plans on filling medication tomorrow, 04/16/2018 through [facility].   LCSWA informed pt of eligibility for financial assistance to obtain the blue card. Pt was strongly encouraged to schedule an appointment with financial counseling, to which patient verbally agreed. No additional concerns noted.

## 2018-05-04 MED FILL — LEVOTHYROXINE 137 MCG TAB: 137 | 30 days supply | Qty: 30 | Fill #0

## 2018-05-05 ENCOUNTER — Other Ambulatory Visit: Payer: Self-pay | Admitting: Pharmacist

## 2018-05-05 DIAGNOSIS — I10 Essential (primary) hypertension: Secondary | ICD-10-CM

## 2018-05-05 MED ORDER — HYDROCHLOROTHIAZIDE 25 MG PO TABS: 25.0000 mg | ORAL_TABLET | Freq: Every day | ORAL | 0 refills | Status: DC

## 2018-05-05 MED ORDER — LOSARTAN POTASSIUM 100 MG PO TABS: 100.0000 mg | ORAL_TABLET | Freq: Every day | ORAL | 0 refills | Status: DC

## 2018-05-05 MED FILL — LABETALOL HCL 100 MG TABS: 100 | 30 days supply | Qty: 60 | Fill #0

## 2018-05-05 MED FILL — LOSARTAN POTASSIUM 100 MG T: 100 | 30 days supply | Qty: 30 | Fill #0

## 2018-05-05 MED FILL — HYDROCHLOROTHIAZIDE 25 MG T: 25 | 30 days supply | Qty: 30 | Fill #0

## 2018-06-05 ENCOUNTER — Emergency Department (HOSPITAL_COMMUNITY): Payer: No Typology Code available for payment source

## 2018-06-05 ENCOUNTER — Encounter (HOSPITAL_COMMUNITY): Payer: Self-pay | Admitting: Emergency Medicine

## 2018-06-05 ENCOUNTER — Emergency Department (HOSPITAL_COMMUNITY)
Admission: EM | Admit: 2018-06-05 | Discharge: 2018-06-05 | Disposition: A | Discharge status: Home / Self Care | Payer: No Typology Code available for payment source | Attending: Emergency Medicine | Admitting: Emergency Medicine

## 2018-06-05 ENCOUNTER — Other Ambulatory Visit: Payer: Self-pay

## 2018-06-05 DIAGNOSIS — E0590 Thyrotoxicosis, unspecified without thyrotoxic crisis or storm: Secondary | ICD-10-CM | POA: Insufficient documentation

## 2018-06-05 DIAGNOSIS — F17210 Nicotine dependence, cigarettes, uncomplicated: Secondary | ICD-10-CM | POA: Insufficient documentation

## 2018-06-05 DIAGNOSIS — Z79899 Other long term (current) drug therapy: Secondary | ICD-10-CM | POA: Insufficient documentation

## 2018-06-05 DIAGNOSIS — I10 Essential (primary) hypertension: Secondary | ICD-10-CM | POA: Insufficient documentation

## 2018-06-05 DIAGNOSIS — R109 Unspecified abdominal pain: Secondary | ICD-10-CM

## 2018-06-05 LAB — URINALYSIS, ROUTINE W REFLEX MICROSCOPIC
Bacteria, UA: NONE SEEN
Bilirubin Urine: NEGATIVE
GLUCOSE, UA: NEGATIVE mg/dL
Ketones, ur: NEGATIVE mg/dL
Leukocytes,Ua: NEGATIVE
Nitrite: NEGATIVE
PH: 6 (ref 5.0–8.0)
Protein, ur: NEGATIVE mg/dL
SPECIFIC GRAVITY, URINE: 1.005 (ref 1.005–1.030)

## 2018-06-05 LAB — CBC
HCT: 40.6 % (ref 36.0–46.0)
Hemoglobin: 12.9 g/dL (ref 12.0–15.0)
MCH: 27.6 pg (ref 26.0–34.0)
MCHC: 31.8 g/dL (ref 30.0–36.0)
MCV: 86.8 fL (ref 80.0–100.0)
NRBC: 0 % (ref 0.0–0.2)
PLATELETS: 244 10*3/uL (ref 150–400)
RBC: 4.68 MIL/uL (ref 3.87–5.11)
RDW: 13 % (ref 11.5–15.5)
WBC: 6 10*3/uL (ref 4.0–10.5)

## 2018-06-05 LAB — COMPREHENSIVE METABOLIC PANEL
ALT: 38 U/L (ref 0–44)
AST: 38 U/L (ref 15–41)
Albumin: 3.8 g/dL (ref 3.5–5.0)
Alkaline Phosphatase: 72 U/L (ref 38–126)
Anion gap: 11 (ref 5–15)
BUN: 9 mg/dL (ref 6–20)
CALCIUM: 9.2 mg/dL (ref 8.9–10.3)
CO2: 24 mmol/L (ref 22–32)
CREATININE: 1.11 mg/dL — AB (ref 0.44–1.00)
Chloride: 98 mmol/L (ref 98–111)
GFR calc non Af Amer: 56 mL/min — ABNORMAL LOW (ref >60)
GLUCOSE: 85 mg/dL (ref 70–99)
Potassium: 3.2 mmol/L — ABNORMAL LOW (ref 3.5–5.1)
SODIUM: 133 mmol/L — AB (ref 135–145)
Total Bilirubin: 0.6 mg/dL (ref 0.3–1.2)
Total Protein: 8.4 g/dL — ABNORMAL HIGH (ref 6.5–8.1)

## 2018-06-05 LAB — LIPASE, BLOOD: Lipase: 26 U/L (ref 11–51)

## 2018-06-05 MED ORDER — OXYCODONE-ACETAMINOPHEN 5-325 MG PO TABS
1.0000 | ORAL_TABLET | ORAL | Status: DC | PRN
  Administered 2018-06-05: 1 via ORAL
  Filled 2018-06-05: qty 1

## 2018-06-05 MED ORDER — ONDANSETRON 4 MG PO TBDP
4.0000 mg | ORAL_TABLET | Freq: Once | ORAL | Status: AC | PRN
  Administered 2018-06-05: 4 mg via ORAL
  Filled 2018-06-05: qty 1

## 2018-06-05 MED ORDER — POTASSIUM CHLORIDE CRYS ER 20 MEQ PO TBCR
40.0000 meq | EXTENDED_RELEASE_TABLET | Freq: Once | ORAL | Status: AC
  Administered 2018-06-05: 40 meq via ORAL
  Filled 2018-06-05: qty 2

## 2018-06-05 MED ORDER — SODIUM CHLORIDE 0.9% FLUSH: 3.0000 mL | Freq: Once | INTRAVENOUS | Status: DC

## 2018-06-05 NOTE — ED Triage Notes (Signed)
Patient reports R sided flank pain onset 2 weeks ago, intermittent and got better, until this morning pain worsened to 9/10 and no improvement with OTC meds - took 2 Goody powders 1 hour ago. Denies urinary symptoms, no N/V/D.

## 2018-06-05 NOTE — ED Provider Notes (Signed)
[facility] EMERGENCY DEPARTMENT Provider Note   CSN: [id number] Arrival date & time: 06/05/18  1454  History   Chief Complaint Chief Complaint  Patient presents with  . Flank Pain   HPI Ann Jarvis is a 53 y.o. female w/ PMH of HTN, CKD3a, Polysubstance use, and alcohol use disorder presenting with right sided flank pain. She states she was in her usual state of health until 2 weeks ago when she began to endorse 10/10 right sided flank pain described as swelling with radiation to back. She initially thought she had indigestion and took some Gas-Ex without relief. She started to take 800mg  ibuprofen, Goodie Powders and Tylenol all at once with significant improvement in her symptoms. She states her pain was manageable until this morning when she had worsening pain associated with sweating and light-headedness. Her dose of ibuprofen and Goodie powder did not improve her pain so she came to the ED for evaluation. She denies any nausea, vomiting, diarrhea, constipation. Denies any dysuria, frequency, urgency. She is post-menopausal and has not had a period in 3 years. She is currently using cocaine with last use 2 days ago.  Past Medical History:  Diagnosis Date  . Cocaine abuse (HCC)   . Grave's disease    s/p radioiodine ablation 03/05/10, F/B Dr. Chestine Spore. On Methimazole  . Hypertension   . Hyperthyroidism   . Scabies   . Thyroid condition   . Tobacco abuse     Patient Active Problem List   Diagnosis Date Noted  . Hot flashes 03/13/2017  . Chronic pain of right knee 12/12/2016  . Chronic pain of left ankle 12/12/2016  . 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 Hypothyroidism 11/13/2015  . Bell's palsy 10/09/2011  . TOBACCO ABUSE 10/10/2008  . COCAINE ABUSE 08/17/2007  . Essential hypertension 08/17/2007    Past Surgical History:  Procedure Laterality Date  . BTL    . MOUTH SURGERY       OB History   No obstetric history on file.    Home  Medications    Prior to Admission medications   Medication Sig Start Date End Date Taking? Authorizing Provider  celecoxib (CELEBREX) 200 MG capsule Take 1 capsule (200 mg total) by mouth 2 (two) times daily. 04/09/18   Marcine Matar, MD  DULoxetine (CYMBALTA) 30 MG capsule Take 1 capsule (30 mg total) by mouth daily. 11/11/17   Marcine Matar, MD  hydrochlorothiazide (HYDRODIURIL) 25 MG tablet Take 1 tablet (25 mg total) by mouth daily. Must keep appointment in March for more refills. 05/05/18   Marcine Matar, MD  labetalol (NORMODYNE) 100 MG tablet Take 1 tablet (100 mg total) by mouth 2 (two) times daily. 04/09/18   Marcine Matar, MD  levothyroxine (SYNTHROID, LEVOTHROID) 137 MCG tablet Take 1 tablet (137 mcg total) by mouth daily before breakfast. 04/09/18   Marcine Matar, MD  losartan (COZAAR) 100 MG tablet Take 1 tablet (100 mg total) by mouth daily. Must keep appointment in March for more refills. 05/05/18   Marcine Matar, MD   Family History No family history on file.  Social History Social History   Tobacco Use  . Smoking status: Current Some Day Smoker    Packs/day: 0.25    Types: Cigarettes  . Smokeless tobacco: Never Used  . Tobacco comment: 1 cig a day  Substance Use Topics  . Alcohol use: Yes    Alcohol/week: 1.0 - 2.0 standard drinks    Types:  1 - 2 Cans of beer per week    Comment: occ  . Drug use: Yes    Types: Marijuana    Comment: marijuana   Allergies   Penicillins  Review of Systems Review of Systems  Constitutional: Positive for diaphoresis. Negative for chills, fatigue and fever.  Respiratory: Negative for cough, shortness of breath and wheezing.   Cardiovascular: Negative for chest pain and palpitations.  Gastrointestinal: Positive for abdominal distention and abdominal pain. Negative for blood in stool, constipation, diarrhea, nausea and vomiting.  Genitourinary: Negative for dysuria, frequency, pelvic pain and urgency.   Musculoskeletal: Negative for back pain.  Neurological: Positive for light-headedness. Negative for dizziness, numbness and headaches.   Physical Exam Updated Vital Signs BP (!) 182/102 (BP Location: Right Arm)   Pulse 73   Temp 98.2 F (36.8 C) (Oral)   Resp 16   LMP 10/07/2015   SpO2 95%   Physical Exam Constitutional:      General: She is not in acute distress.    Appearance: Normal appearance. She is normal weight.  HENT:     Mouth/Throat:     Mouth: Mucous membranes are moist.     Pharynx: Oropharynx is clear. No oropharyngeal exudate.  Eyes:     Extraocular Movements: Extraocular movements intact.     Conjunctiva/sclera: Conjunctivae normal.     Pupils: Pupils are equal, round, and reactive to light.     Comments: + bilateral exophthalmos  Neck:     Musculoskeletal: Normal range of motion and neck supple.  Cardiovascular:     Rate and Rhythm: Normal rate and regular rhythm.     Pulses: Normal pulses.     Heart sounds: Normal heart sounds.  Pulmonary:     Effort: Pulmonary effort is normal.     Breath sounds: Normal breath sounds. No wheezing or rales.  Abdominal:     General: Bowel sounds are normal. There is no distension.     Palpations: Abdomen is soft.     Tenderness: There is abdominal tenderness (Tenderness to palpation RLL). There is no right CVA tenderness.  Musculoskeletal: Normal range of motion.        General: No swelling.  Skin:    General: Skin is warm and dry.     Coloration: Skin is not jaundiced.  Neurological:     Mental Status: She is alert.      ED Treatments / Results  Labs (all labs ordered are listed, but only abnormal results are displayed) Labs Reviewed  COMPREHENSIVE METABOLIC PANEL - Abnormal; Notable for the following components:      Result Value   Sodium 133 (*)    Potassium 3.2 (*)    Creatinine, Ser 1.11 (*)    Total Protein 8.4 (*)    GFR calc non Af Amer 56 (*)    All other components within normal limits   URINALYSIS, ROUTINE W REFLEX MICROSCOPIC - Abnormal; Notable for the following components:   Color, Urine STRAW (*)    Hgb urine dipstick SMALL (*)    All other components within normal limits  LIPASE, BLOOD  CBC  RAPID URINE DRUG SCREEN, HOSP PERFORMED    EKG None  Radiology Ct Abdomen Pelvis Wo Contrast  Result Date: 06/05/2018 CLINICAL DATA:  Right flank pain for the past 2 weeks intermittently. The pain returned and became worse this morning. EXAM: CT ABDOMEN AND PELVIS WITHOUT CONTRAST TECHNIQUE: Multidetector CT imaging of the abdomen and pelvis was performed following the standard protocol without  IV contrast. COMPARISON:  08/27/2013. FINDINGS: Lower chest: Stable mild linear scarring at the left lung base. Mildly enlarged heart. Hepatobiliary: No focal liver abnormality is seen. No gallstones, gallbladder wall thickening, or biliary dilatation. Pancreas: Choose Spleen: Normal in size without focal abnormality. Adrenals/Urinary Tract: Adrenal glands are unremarkable. Kidneys are normal, without renal calculi, focal lesion, or hydronephrosis. Bladder is unremarkable. Stomach/Bowel: Stomach is within normal limits. Appendix appears normal. No evidence of bowel wall thickening, distention, or inflammatory changes. Vascular/Lymphatic: No significant vascular findings are present. No enlarged abdominal or pelvic lymph nodes. Reproductive: Multiple poorly defined uterine masses, better seen on the previous examination with intravenous contrast. Unremarkable ovaries. Other: Small umbilical hernia containing fat. Musculoskeletal: Mild lumbar spine degenerative changes. IMPRESSION: 1. No acute abnormality. 2. Multiple uterine fibroids. Electronically Signed   By: Beckie Salts M.D.   On: 06/05/2018 19:42    Procedures Procedures (including critical care time)  Medications Ordered in ED Medications  sodium chloride flush (NS) 0.9 % injection 3 mL (has no administration in time range)   oxyCODONE-acetaminophen (PERCOCET/ROXICET) 5-325 MG per tablet 1 tablet (1 tablet Oral Given 06/05/18 1520)  ondansetron (ZOFRAN-ODT) disintegrating tablet 4 mg (4 mg Oral Given 06/05/18 1519)  potassium chloride SA (K-DUR,KLOR-CON) CR tablet 40 mEq (40 mEq Oral Given 06/05/18 1848)     Initial Impression / Assessment and Plan / ED Course  I have reviewed the triage vital signs and the nursing notes.  Pertinent labs & imaging results that were available during my care of the patient were reviewed by me and considered in my medical decision making (see chart for details).    Ms.Eillis is a 53 yo F w/ PMH of HTN, Hypothyroidism, and polysubstance abuse presenting with R sided flank pain. She may have recurrent nephrolithiasis although duration and acuity suggest possibly more musculoskeletal pain. Currently vitals are stable with no leukocytosis and UA show no indication for UTI. Will get CT Abd/Pelvis to assess.    CT Abd/Pelvis showing multiple uterine fibroids but no renal stones or diverticulitis. No obvious explanation for her acute pain. Most likely musculoskeletal.  Final Clinical Impressions(s) / ED Diagnoses   Final diagnoses:  Right flank pain   Ms.Manchester is a 53 yo F w/ PMH of HTN, hypothyroidism and polysubstance abuse presenting with R sided flank pain most likely due to musculoskeletal pain. Will discharge home.  ED Discharge Orders    None       Theotis Barrio, MD 06/05/18 2034    Cathren Laine, MD 06/05/18 2216

## 2018-06-05 NOTE — Discharge Instructions (Signed)
Dear Ann Jarvis  You came to [place] with right flank pain. We have determined this was not caused by an infection or kidney stone. Here are our recommendations for you at discharge:  Take over the counter pain medication for relief. Call your primary care provider for follow up appointment if your pain does not improve.  Thank you for choosing [facility]

## 2018-06-11 ENCOUNTER — Ambulatory Visit: Payer: No Typology Code available for payment source | Admitting: Internal Medicine

## 2018-06-18 ENCOUNTER — Encounter: Payer: Self-pay | Admitting: Internal Medicine

## 2018-06-18 ENCOUNTER — Ambulatory Visit: Payer: Self-pay | Attending: Internal Medicine | Admitting: Internal Medicine

## 2018-06-18 ENCOUNTER — Other Ambulatory Visit: Payer: Self-pay

## 2018-06-18 DIAGNOSIS — I10 Essential (primary) hypertension: Secondary | ICD-10-CM

## 2018-06-18 DIAGNOSIS — E890 Postprocedural hypothyroidism: Secondary | ICD-10-CM

## 2018-06-18 MED ORDER — LOSARTAN POTASSIUM 100 MG PO TABS: 100.0000 mg | ORAL_TABLET | Freq: Every day | ORAL | 6 refills | Status: DC

## 2018-06-18 MED ORDER — LEVOTHYROXINE SODIUM 137 MCG PO TABS: 137.0000 ug | ORAL_TABLET | Freq: Every day | ORAL | 6 refills | Status: DC

## 2018-06-18 MED ORDER — LABETALOL HCL 100 MG PO TABS: 100.0000 mg | ORAL_TABLET | Freq: Two times a day (BID) | ORAL | 6 refills | Status: DC

## 2018-06-18 MED ORDER — HYDROCHLOROTHIAZIDE 25 MG PO TABS: 25.0000 mg | ORAL_TABLET | Freq: Every day | ORAL | 6 refills | Status: DC

## 2018-06-18 NOTE — Patient Instructions (Signed)
Given appointment with the clinical pharmacist in 2 weeks for repeat blood pressure check.

## 2018-06-18 NOTE — Progress Notes (Signed)
Patient ID: Ann Jarvis, female    DOB: 1965/04/06  MRN: [id number]  CC: Hypertension   Subjective: Ann Jarvis is a 53 y.o. female who presents for UC Her concerns today include:  Hx HTN, Tob, Hypothyroid post treatment for Graves' disease, cocaine abuse, CKD stage 3(last GFR improved to 66 in 05/2016).   Patient presents today requesting refill on all 3 blood pressure medications.  Patient states that she has been out of all of them for the past 1 week.  She denies any headaches or dizziness.  No chest pains or shortness of breath.  No lower extremity edema.  She is in a rush to pick up her grandkids.  States that she will return at another time for regular chronic disease management.   Patient Active Problem List   Diagnosis Date Noted  . Hot flashes 03/13/2017  . Chronic pain of right knee 12/12/2016  . Chronic pain of left ankle 12/12/2016  . 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 Hypothyroidism 11/13/2015  . Bell's palsy 10/09/2011  . TOBACCO ABUSE 10/10/2008  . COCAINE ABUSE 08/17/2007  . Essential hypertension 08/17/2007     Current Outpatient Medications on File Prior to Visit  Medication Sig Dispense Refill  . celecoxib (CELEBREX) 200 MG capsule Take 1 capsule (200 mg total) by mouth 2 (two) times daily. 120 capsule 1  . DULoxetine (CYMBALTA) 30 MG capsule Take 1 capsule (30 mg total) by mouth daily. 30 capsule 3   No current facility-administered medications on file prior to visit.     Allergies  Allergen Reactions  . Penicillins Swelling    Social History   Socioeconomic History  . Marital status: Single    Spouse name: Not on file  . Number of children: Not on file  . Years of education: Not on file  . Highest education level: Not on file  Occupational History  . Not on file  Social Needs  . Financial resource strain: Not on file  . Food insecurity:    Worry: Not on file    Inability: Not on file  . Transportation needs:   Medical: Not on file    Non-medical: Not on file  Tobacco Use  . Smoking status: Current Some Day Smoker    Packs/day: 0.25    Types: Cigarettes  . Smokeless tobacco: Never Used  . Tobacco comment: 1 cig a day  Substance and Sexual Activity  . Alcohol use: Yes    Alcohol/week: 1.0 - 2.0 standard drinks    Types: 1 - 2 Cans of beer per week    Comment: occ  . Drug use: Yes    Types: Marijuana    Comment: marijuana  . Sexual activity: Not on file  Lifestyle  . Physical activity:    Days per week: Not on file    Minutes per session: Not on file  . Stress: Not on file  Relationships  . Social connections:    Talks on phone: Not on file    Gets together: Not on file    Attends religious service: Not on file    Active member of club or organization: Not on file    Attends meetings of clubs or organizations: Not on file    Relationship status: Not on file  . Intimate partner violence:    Fear of current or ex partner: Not on file    Emotionally abused: Not on file    Physically abused: Not on file    Forced  sexual activity: Not on file  Other Topics Concern  . Not on file  Social History Narrative   Smokes 1 pack/week x 20 years, smokes marijuna, no cocaine in the last year.  Works for [employer].      Financial assistance approved for 100% discount at [facility] and has [facility] card Rudell Cobb Aug 01 2009 8.58am   Financial assistance approved for 100% discount at [facility] and has [facility] card Gavin Pound February 02, 2010 12.20pm    No family history on file.  Past Surgical History:  Procedure Laterality Date  . BTL    . MOUTH SURGERY      ROS: Review of Systems Negative except as stated above  PHYSICAL EXAM: BP (!) 171/100   Pulse 68   Temp 98.4 F (36.9 C) (Oral)   Resp 16   Wt 190 lb (86.2 kg)   LMP 10/07/2015   SpO2 98%   BMI 26.50 kg/m   Physical Exam  General appearance - alert, well appearing, and in no distress Mental status - normal mood, behavior,  speech, dress, motor activity, and thought processes Chest - clear to auscultation, no wheezes, rales or rhonchi, symmetric air entry Heart - normal rate, regular rhythm, normal S1, S2, no murmurs, rubs, clicks or gallops Extremities - peripheral pulses normal, no pedal edema, no clubbing or cyanosis  CMP Latest Ref Rng & Units 06/05/2018 11/11/2017 12/12/2016  Glucose 70 - 99 mg/dL 85 95 97  BUN 6 - 20 mg/dL 9 13 16   Creatinine 0.44 - 1.00 mg/dL 5.05(X) 8.33 5.82(P)  Sodium 135 - 145 mmol/L 133(L) 140 141  Potassium 3.5 - 5.1 mmol/L 3.2(L) 3.9 3.8  Chloride 98 - 111 mmol/L 98 101 101  CO2 22 - 32 mmol/L 24 26 25   Calcium 8.9 - 10.3 mg/dL 9.2 9.1 9.9  Total Protein 6.5 - 8.1 g/dL 1.8(F) 7.4 8.2  Total Bilirubin 0.3 - 1.2 mg/dL 0.6 0.3 0.5  Alkaline Phos 38 - 126 U/L 72 74 87  AST 15 - 41 U/L 38 14 20  ALT 0 - 44 U/L 38 10 16   Lipid Panel     Component Value Date/Time   CHOL 146 11/11/2017 1431   TRIG 66 11/11/2017 1431   HDL 68 11/11/2017 1431   CHOLHDL 2.1 11/11/2017 1431   CHOLHDL 2.3 11/11/2014 1004   VLDL 19 11/11/2014 1004   LDLCALC 65 11/11/2017 1431    CBC    Component Value Date/Time   WBC 6.0 06/05/2018 1514   RBC 4.68 06/05/2018 1514   HGB 12.9 06/05/2018 1514   HGB 13.3 11/11/2017 1431   HCT 40.6 06/05/2018 1514   HCT 41.9 11/11/2017 1431   PLT 244 06/05/2018 1514   PLT 250 11/11/2017 1431   MCV 86.8 06/05/2018 1514   MCV 88 11/11/2017 1431   MCH 27.6 06/05/2018 1514   MCHC 31.8 06/05/2018 1514   RDW 13.0 06/05/2018 1514   RDW 13.3 11/11/2017 1431   LYMPHSABS 1.7 03/11/2016 1258   MONOABS 0.4 03/11/2016 1258   EOSABS 0.1 03/11/2016 1258   BASOSABS 0.0 03/11/2016 1258    ASSESSMENT AND PLAN: 1. Essential hypertension Not at goal.  Patient out of medications for 1 week Discussed changing her prescription to a 90-day supply so that she is not having to come back to the pharmacy every month but patient states she would not be able to afford 77-month supply  at a time. - losartan (COZAAR) 100 MG tablet; Take 1 tablet (  100 mg total) by mouth daily. Must keep appointment in March for more refills.  Dispense: 30 tablet; Refill: 6 - labetalol (NORMODYNE) 100 MG tablet; Take 1 tablet (100 mg total) by mouth 2 (two) times daily.  Dispense: 60 tablet; Refill: 6 - hydrochlorothiazide (HYDRODIURIL) 25 MG tablet; Take 1 tablet (25 mg total) by mouth daily. Must keep appointment in March for more refills.  Dispense: 30 tablet; Refill: 6  2. Postoperative hypothyroidism Needing refill on levothyroxine - levothyroxine (SYNTHROID, LEVOTHROID) 137 MCG tablet; Take 1 tablet (137 mcg total) by mouth daily before breakfast.  Dispense: 30 tablet; Refill: 6  Patient was given the opportunity to ask questions.  Patient verbalized understanding of the plan and was able to repeat key elements of the plan.   No orders of the defined types were placed in this encounter.    Requested Prescriptions   Signed Prescriptions Disp Refills  . losartan (COZAAR) 100 MG tablet 30 tablet 6    Sig: Take 1 tablet (100 mg total) by mouth daily. Must keep appointment in March for more refills.  Marland Kitchen labetalol (NORMODYNE) 100 MG tablet 60 tablet 6    Sig: Take 1 tablet (100 mg total) by mouth 2 (two) times daily.  Marland Kitchen levothyroxine (SYNTHROID, LEVOTHROID) 137 MCG tablet 30 tablet 6    Sig: Take 1 tablet (137 mcg total) by mouth daily before breakfast.  . hydrochlorothiazide (HYDRODIURIL) 25 MG tablet 30 tablet 6    Sig: Take 1 tablet (25 mg total) by mouth daily. Must keep appointment in March for more refills.    Return in about 2 months (around 08/18/2018).  Jonah Blue, MD, FACP

## 2018-06-29 ENCOUNTER — Telehealth: Payer: Self-pay | Admitting: Internal Medicine

## 2018-07-01 MED FILL — LOSARTAN POTASSIUM 100 MG T: 100 | 30 days supply | Qty: 30 | Fill #0

## 2018-07-01 MED FILL — CELECOXIB 200 MG CAP: 200 | 30 days supply | Qty: 60 | Fill #0 | Status: TO

## 2018-07-01 MED FILL — LEVOTHYROXINE 137 MCG TAB: 137 | 30 days supply | Qty: 30 | Fill #0 | Status: TO

## 2018-07-01 MED FILL — HYDROCHLOROTHIAZIDE 25 MG T: 25 | 30 days supply | Qty: 30 | Fill #0

## 2018-07-01 MED FILL — LABETALOL HCL 100 MG TABS: 100 | 30 days supply | Qty: 60 | Fill #0

## 2018-08-26 MED FILL — LOSARTAN POTASSIUM 100 MG T: 100 | 30 days supply | Qty: 30 | Fill #1

## 2018-08-26 MED FILL — LABETALOL HCL 100 MG TABS: 100 | 30 days supply | Qty: 60 | Fill #1

## 2018-08-26 MED FILL — LEVOTHYROXINE 137 MCG TAB: 137 | 30 days supply | Qty: 30 | Fill #1 | Status: TO

## 2018-08-27 ENCOUNTER — Other Ambulatory Visit: Payer: Self-pay

## 2018-08-27 ENCOUNTER — Ambulatory Visit: Payer: Self-pay | Attending: Family Medicine | Admitting: Physician Assistant

## 2018-08-27 DIAGNOSIS — M25562 Pain in left knee: Secondary | ICD-10-CM

## 2018-08-27 DIAGNOSIS — H9193 Unspecified hearing loss, bilateral: Secondary | ICD-10-CM

## 2018-08-27 DIAGNOSIS — S83282A Other tear of lateral meniscus, current injury, left knee, initial encounter: Secondary | ICD-10-CM

## 2018-08-27 DIAGNOSIS — G8929 Other chronic pain: Secondary | ICD-10-CM

## 2018-08-27 DIAGNOSIS — R936 Abnormal findings on diagnostic imaging of limbs: Secondary | ICD-10-CM

## 2018-08-27 MED ORDER — TRAMADOL HCL 50 MG PO TABS: 50.0000 mg | ORAL_TABLET | Freq: Three times a day (TID) | ORAL | 0 refills | Status: AC | PRN

## 2018-08-27 MED FILL — traMADol HCL 50 MG TABS: 50 | 5 days supply | Qty: 15 | Fill #0

## 2018-08-27 NOTE — Progress Notes (Signed)
Virtual Visit via Telephone Note  I connected with Ann Jarvis on 08/27/18 at  2:50 PM EDT by telephone and verified that I am speaking with the correct person using two identifiers.   I discussed the limitations, risks, security and privacy concerns of performing an evaluation and management service by telephone and the availability of in person appointments. I also discussed with the patient that there may be a patient responsible charge related to this service. The patient expressed understanding and agreed to proceed.  Patient location:  home My Location:  [facility] office Persons on the call:  Myself and the patient    History of Present Illness: L knee pain and swelling.  Hasn't seen ortho pedist since MRI 01/2018 which showed multiple abnormalities.  No acute injury.  Denies redness.  No fever.  Pain worse with weight-bearing.   Also c/o decreased hearing over the last year.  No pain    Observations/Objective: A&Ox3   Assessment and Plan: 1. Acute lateral meniscus tear of left knee, initial encounter - Ambulatory referral to Orthopedic Surgery - traMADol (ULTRAM) 50 MG tablet; Take 1 tablet (50 mg total) by mouth every 8 (eight) hours as needed for up to 5 days.  Dispense: 15 tablet; Refill: 0  2. Chronic pain of left knee - Ambulatory referral to Orthopedic Surgery - traMADol (ULTRAM) 50 MG tablet; Take 1 tablet (50 mg total) by mouth every 8 (eight) hours as needed for up to 5 days.  Dispense: 15 tablet; Refill: 0  3. Abnormal MRI, knee - Ambulatory referral to Orthopedic Surgery  4. Decreased hearing of both ears - Ambulatory referral to ENT    Follow Up Instructions: See PCP 1-2 months   I discussed the assessment and treatment plan with the patient. The patient was provided an opportunity to ask questions and all were answered. The patient agreed with the plan and demonstrated an understanding of the instructions.   The patient was advised to call back or seek  an in-person evaluation if the symptoms worsen or if the condition fails to improve as anticipated.  I provided 11 minutes of non-face-to-face time during this encounter.   Georgian Co, PA-C  Patient ID: Ann Jarvis, female   DOB: May 16, 1965, 53 y.o.   MRN: [id number]

## 2018-08-27 NOTE — Progress Notes (Signed)
Pt. Stated she is having left ear pain and left knee pain.

## 2018-09-10 ENCOUNTER — Ambulatory Visit: Payer: No Typology Code available for payment source | Admitting: Orthopaedic Surgery

## 2018-10-20 MED FILL — LABETALOL HCL 100 MG TABS: 100 | 30 days supply | Qty: 60 | Fill #2

## 2018-10-20 MED FILL — LEVOTHYROXINE 137 MCG TAB: 137 | 30 days supply | Qty: 30 | Fill #2 | Status: TO

## 2018-10-20 MED FILL — LOSARTAN POTASSIUM 100 MG T: 100 | 30 days supply | Qty: 30 | Fill #2

## 2018-10-20 MED FILL — HYDROCHLOROTHIAZIDE 25 MG T: 25 | 30 days supply | Qty: 30 | Fill #1

## 2018-12-09 MED FILL — LABETALOL HCL 100 MG TABS: 100 | 30 days supply | Qty: 60 | Fill #3

## 2018-12-09 MED FILL — LEVOTHYROXINE 137 MCG TAB: 137 | 30 days supply | Qty: 30 | Fill #3 | Status: TO

## 2018-12-09 MED FILL — LOSARTAN POTASSIUM 100 MG T: 100 | 30 days supply | Qty: 30 | Fill #3

## 2019-01-07 MED FILL — LEVOTHYROXINE 137 MCG TAB: 137 | 30 days supply | Qty: 30 | Fill #3 | Status: TO

## 2019-01-07 MED FILL — LABETALOL HCL 100 MG TABS: 100 | 30 days supply | Qty: 60 | Fill #3

## 2019-01-29 MED FILL — LEVOTHYROXINE 137 MCG TAB: 137 | 30 days supply | Qty: 30 | Fill #3 | Status: TO

## 2019-01-29 MED FILL — LOSARTAN POTASSIUM 100 MG T: 100 | 30 days supply | Qty: 30 | Fill #4

## 2019-03-05 MED FILL — LEVOTHYROXINE 137 MCG TAB: 137 | 30 days supply | Qty: 30 | Fill #4 | Status: TO

## 2019-03-05 MED FILL — LOSARTAN POTASSIUM 100 MG T: 100 | 30 days supply | Qty: 30 | Fill #5

## 2019-03-19 ENCOUNTER — Ambulatory Visit: Payer: Self-pay | Attending: Internal Medicine | Admitting: Internal Medicine

## 2019-03-19 ENCOUNTER — Other Ambulatory Visit: Payer: Self-pay

## 2019-03-19 DIAGNOSIS — I10 Essential (primary) hypertension: Secondary | ICD-10-CM

## 2019-03-19 DIAGNOSIS — Z1211 Encounter for screening for malignant neoplasm of colon: Secondary | ICD-10-CM

## 2019-03-19 DIAGNOSIS — K219 Gastro-esophageal reflux disease without esophagitis: Secondary | ICD-10-CM

## 2019-03-19 DIAGNOSIS — Z1231 Encounter for screening mammogram for malignant neoplasm of breast: Secondary | ICD-10-CM

## 2019-03-19 DIAGNOSIS — E890 Postprocedural hypothyroidism: Secondary | ICD-10-CM

## 2019-03-19 DIAGNOSIS — M159 Polyosteoarthritis, unspecified: Secondary | ICD-10-CM

## 2019-03-19 MED ORDER — CELECOXIB 200 MG PO CAPS: 200.0000 mg | ORAL_CAPSULE | Freq: Every day | ORAL | 6 refills | Status: DC

## 2019-03-19 MED ORDER — OMEPRAZOLE 20 MG PO CPDR: 20.0000 mg | DELAYED_RELEASE_CAPSULE | Freq: Every day | ORAL | 6 refills | Status: DC

## 2019-03-19 MED ORDER — LEVOTHYROXINE SODIUM 137 MCG PO TABS: 137.0000 ug | ORAL_TABLET | Freq: Every day | ORAL | 6 refills | Status: DC

## 2019-03-19 MED ORDER — LABETALOL HCL 100 MG PO TABS: 100.0000 mg | ORAL_TABLET | Freq: Two times a day (BID) | ORAL | 6 refills | Status: DC

## 2019-03-19 MED ORDER — CELECOXIB 200 MG PO CAPS: 200.0000 mg | ORAL_CAPSULE | Freq: Two times a day (BID) | ORAL | 1 refills | Status: DC

## 2019-03-19 MED ORDER — LOSARTAN POTASSIUM 100 MG PO TABS: 100.0000 mg | ORAL_TABLET | Freq: Every day | ORAL | 6 refills | Status: DC

## 2019-03-19 MED ORDER — HYDROCHLOROTHIAZIDE 25 MG PO TABS: 25.0000 mg | ORAL_TABLET | Freq: Every day | ORAL | 6 refills | Status: DC

## 2019-03-19 MED FILL — OMEPRAZOLE 20 MG CAP: 20 | 30 days supply | Qty: 30 | Fill #0

## 2019-03-19 MED FILL — !CELEBREX 200 MG CAPSULE: 200 | 30 days supply | Qty: 30 | Fill #0

## 2019-03-19 MED FILL — HYDROCHLOROTHIAZIDE 25 MG T: 25 | 30 days supply | Qty: 30 | Fill #0

## 2019-03-19 MED FILL — LABETALOL HCL 100 MG TABS: 100 | 30 days supply | Qty: 60 | Fill #0

## 2019-03-19 NOTE — Progress Notes (Signed)
Virtual Visit via Telephone Note Due to current restrictions/limitations of in-office visits due to the COVID-19 pandemic, this scheduled clinical appointment was converted to a telehealth visit  I connected with Ann Jarvis on 03/19/19 at 11:17 a.m by telephone and verified that I am speaking with the correct person using two identifiers. I am in my office.  The patient is at home.  Only the patient and myself participated in this encounter.  I discussed the limitations, risks, security and privacy concerns of performing an evaluation and management service by telephone and the availability of in person appointments. I also discussed with the patient that there may be a patient responsible charge related to this service. The patient expressed understanding and agreed to proceed.   History of Present Illness: Hx HTN, Tob, Hypothyroid post treatment for Graves' disease, cocaine abuse, CKD stage 2-3(last GFR improved to 66 in 05/2016).  Still has problems in knees and notice intermittent swelling and pain in knuckles especially in cold weather.  Needing RF on Celebrex which she has been out of for a while.  Seen in ER at [facility]  2 days ago for abdominal pain.  She had CAT scan of the abdomen which was essentially negative except for fibroid uterus.  Some of her symptoms were thought to be due to acid reflux.  Prescribed  Protonix which she could not afford.  She is wanting to know if we have this medication or similar 1 that is not as expensive.  HTN:  Out of Labetalol and HCTZ because she sometimes can not afford to get all of them at one time.  She does have the Cozaar and is taking that.  No device to check blood pressure.  No chest pains or shortness of breath.  She has some mild lower extremity edema.  She tells me that blood pressure was elevated when she was seen in the emergency room 2 nights ago  Hypothyroid:  Reports compliance with levothyroxine.  No significant weight changes.   No palpitations.   HM: She is due for colon cancer screening, mammogram and Pap smear. Current Outpatient Medications on File Prior to Visit  Medication Sig Dispense Refill  . celecoxib (CELEBREX) 200 MG capsule Take 1 capsule (200 mg total) by mouth 2 (two) times daily. 120 capsule 1  . DULoxetine (CYMBALTA) 30 MG capsule Take 1 capsule (30 mg total) by mouth daily. (Patient not taking: Reported on 08/27/2018) 30 capsule 3  . hydrochlorothiazide (HYDRODIURIL) 25 MG tablet Take 1 tablet (25 mg total) by mouth daily. Must keep appointment in March for more refills. 30 tablet 6  . labetalol (NORMODYNE) 100 MG tablet Take 1 tablet (100 mg total) by mouth 2 (two) times daily. 60 tablet 6  . levothyroxine (SYNTHROID, LEVOTHROID) 137 MCG tablet Take 1 tablet (137 mcg total) by mouth daily before breakfast. 30 tablet 6  . losartan (COZAAR) 100 MG tablet Take 1 tablet (100 mg total) by mouth daily. Must keep appointment in March for more refills. 30 tablet 6   No current facility-administered medications on file prior to visit.    Observations/Objective: Results for orders placed or performed during the hospital encounter of 06/05/18  Lipase, blood  Result Value Ref Range   Lipase 26 11 - 51 U/L  Comprehensive metabolic panel  Result Value Ref Range   Sodium 133 (L) 135 - 145 mmol/L   Potassium 3.2 (L) 3.5 - 5.1 mmol/L   Chloride 98 98 - 111 mmol/L   CO2 24  22 - 32 mmol/L   Glucose, Bld 85 70 - 99 mg/dL   BUN 9 6 - 20 mg/dL   Creatinine, Ser 1.11 (H) 0.44 - 1.00 mg/dL   Calcium 9.2 8.9 - 10.3 mg/dL   Total Protein 8.4 (H) 6.5 - 8.1 g/dL   Albumin 3.8 3.5 - 5.0 g/dL   AST 38 15 - 41 U/L   ALT 38 0 - 44 U/L   Alkaline Phosphatase 72 38 - 126 U/L   Total Bilirubin 0.6 0.3 - 1.2 mg/dL   GFR calc non Af Amer 56 (L) >60 mL/min   GFR calc Af Amer >60 >60 mL/min   Anion gap 11 5 - 15  CBC  Result Value Ref Range   WBC 6.0 4.0 - 10.5 K/uL   RBC 4.68 3.87 - 5.11 MIL/uL   Hemoglobin 12.9 12.0  - 15.0 g/dL   HCT 40.6 36.0 - 46.0 %   MCV 86.8 80.0 - 100.0 fL   MCH 27.6 26.0 - 34.0 pg   MCHC 31.8 30.0 - 36.0 g/dL   RDW 13.0 11.5 - 15.5 %   Platelets 244 150 - 400 K/uL   nRBC 0.0 0.0 - 0.2 %  Urinalysis, Routine w reflex microscopic  Result Value Ref Range   Color, Urine STRAW (A) YELLOW   APPearance CLEAR CLEAR   Specific Gravity, Urine 1.005 1.005 - 1.030   pH 6.0 5.0 - 8.0   Glucose, UA NEGATIVE NEGATIVE mg/dL   Hgb urine dipstick SMALL (A) NEGATIVE   Bilirubin Urine NEGATIVE NEGATIVE   Ketones, ur NEGATIVE NEGATIVE mg/dL   Protein, ur NEGATIVE NEGATIVE mg/dL   Nitrite NEGATIVE NEGATIVE   Leukocytes,Ua NEGATIVE NEGATIVE   RBC / HPF 0-5 0 - 5 RBC/hpf   WBC, UA 0-5 0 - 5 WBC/hpf   Bacteria, UA NONE SEEN NONE SEEN   Squamous Epithelial / LPF 6-10 0 - 5     Assessment and Plan: 1. Essential hypertension Patient plans to pick up all 3 of her medicines today.  Advised to apply for the blue card at the pharmacy so that she can credit medicines when she is not able to afford them - labetalol (NORMODYNE) 100 MG tablet; Take 1 tablet (100 mg total) by mouth 2 (two) times daily.  Dispense: 60 tablet; Refill: 6 - losartan (COZAAR) 100 MG tablet; Take 1 tablet (100 mg total) by mouth daily. Must keep appointment in March for more refills.  Dispense: 30 tablet; Refill: 6 - hydrochlorothiazide (HYDRODIURIL) 25 MG tablet; Take 1 tablet (25 mg total) by mouth daily. Must keep appointment in March for more refills.  Dispense: 30 tablet; Refill: 6 - Lipid panel  2. Postoperative hypothyroidism - levothyroxine (SYNTHROID) 137 MCG tablet; Take 1 tablet (137 mcg total) by mouth daily before breakfast.  Dispense: 30 tablet; Refill: 6 - TSH  3. Gastroesophageal reflux disease without esophagitis GERD precautions discussed.  I have sent prescription to the pharmacy for omeprazole  4. Osteoarthritis of multiple joints, unspecified osteoarthritis type - celecoxib (CELEBREX) 200 MG capsule;  Take 1 capsule (200 mg total) by mouth daily.  Dispense: 30 capsule; Refill: 6  5. Encounter for screening mammogram for malignant neoplasm of breast When patient comes to pick up prescriptions from the pharmacy, I have asked her to get the form from my [employer] for the mammogram scholarship - MM Digital Screening; Future  6. Screening for colon cancer When she comes to pick up medicines, I have asked her to get the  fit kit from my [facility] - Fecal occult blood, imunochemical([facility])   Follow Up Instructions: 7 wks for pap   I discussed the assessment and treatment plan with the patient. The patient was provided an opportunity to ask questions and all were answered. The patient agreed with the plan and demonstrated an understanding of the instructions.   The patient was advised to call back or seek an in-person evaluation if the symptoms worsen or if the condition fails to improve as anticipated.  I provided 12 minutes of non-face-to-face time during this encounter.   Karle Plumber, MD

## 2019-03-20 LAB — LIPID PANEL
Chol/HDL Ratio: 2.2 ratio (ref 0.0–4.4)
Cholesterol, Total: 165 mg/dL (ref 100–199)
HDL: 75 mg/dL (ref >39)
LDL Chol Calc (NIH): 78 mg/dL (ref 0–99)
Triglycerides: 60 mg/dL (ref 0–149)
VLDL Cholesterol Cal: 12 mg/dL (ref 5–40)

## 2019-03-20 LAB — TSH: TSH: 1.73 u[IU]/mL (ref 0.450–4.500)

## 2019-03-22 ENCOUNTER — Telehealth: Payer: Self-pay

## 2019-03-22 NOTE — Telephone Encounter (Signed)
Contacted pt to go over lab results pt is aware and doesn't have any questions or concerns 

## 2019-03-24 MED FILL — POLYETHYLENE GLYCOL 3350 PO: 17 | 14 days supply | Qty: 238 | Fill #0

## 2019-05-04 ENCOUNTER — Encounter (HOSPITAL_COMMUNITY): Payer: Self-pay | Admitting: Emergency Medicine

## 2019-05-04 ENCOUNTER — Emergency Department (HOSPITAL_COMMUNITY)
Admission: EM | Admit: 2019-05-04 | Discharge: 2019-05-04 | Disposition: A | Discharge status: Home / Self Care | Payer: No Typology Code available for payment source | Attending: Emergency Medicine | Admitting: Emergency Medicine

## 2019-05-04 ENCOUNTER — Other Ambulatory Visit: Payer: Self-pay

## 2019-05-04 DIAGNOSIS — F1210 Cannabis abuse, uncomplicated: Secondary | ICD-10-CM | POA: Insufficient documentation

## 2019-05-04 DIAGNOSIS — E039 Hypothyroidism, unspecified: Secondary | ICD-10-CM | POA: Insufficient documentation

## 2019-05-04 DIAGNOSIS — M79604 Pain in right leg: Secondary | ICD-10-CM

## 2019-05-04 DIAGNOSIS — Z79899 Other long term (current) drug therapy: Secondary | ICD-10-CM | POA: Insufficient documentation

## 2019-05-04 DIAGNOSIS — I10 Essential (primary) hypertension: Secondary | ICD-10-CM | POA: Insufficient documentation

## 2019-05-04 DIAGNOSIS — F17210 Nicotine dependence, cigarettes, uncomplicated: Secondary | ICD-10-CM | POA: Insufficient documentation

## 2019-05-04 LAB — CBC
HCT: 43.6 % (ref 36.0–46.0)
Hemoglobin: 13.6 g/dL (ref 12.0–15.0)
MCH: 27.9 pg (ref 26.0–34.0)
MCHC: 31.2 g/dL (ref 30.0–36.0)
MCV: 89.5 fL (ref 80.0–100.0)
Platelets: 216 10*3/uL (ref 150–400)
RBC: 4.87 MIL/uL (ref 3.87–5.11)
RDW: 12.8 % (ref 11.5–15.5)
WBC: 5.4 10*3/uL (ref 4.0–10.5)
nRBC: 0 % (ref 0.0–0.2)

## 2019-05-04 LAB — URINALYSIS, ROUTINE W REFLEX MICROSCOPIC
Bacteria, UA: NONE SEEN
Bilirubin Urine: NEGATIVE
Glucose, UA: NEGATIVE mg/dL
Ketones, ur: NEGATIVE mg/dL
Leukocytes,Ua: NEGATIVE
Nitrite: NEGATIVE
Protein, ur: NEGATIVE mg/dL
Specific Gravity, Urine: 1.019 (ref 1.005–1.030)
pH: 6 (ref 5.0–8.0)

## 2019-05-04 LAB — COMPREHENSIVE METABOLIC PANEL
ALT: 16 U/L (ref 0–44)
AST: 21 U/L (ref 15–41)
Albumin: 3.4 g/dL — ABNORMAL LOW (ref 3.5–5.0)
Alkaline Phosphatase: 93 U/L (ref 38–126)
Anion gap: 9 (ref 5–15)
BUN: 16 mg/dL (ref 6–20)
CO2: 27 mmol/L (ref 22–32)
Calcium: 8.9 mg/dL (ref 8.9–10.3)
Chloride: 104 mmol/L (ref 98–111)
Creatinine, Ser: 1.35 mg/dL — ABNORMAL HIGH (ref 0.44–1.00)
GFR calc Af Amer: 51 mL/min — ABNORMAL LOW (ref >60)
GFR calc non Af Amer: 44 mL/min — ABNORMAL LOW (ref >60)
Glucose, Bld: 112 mg/dL — ABNORMAL HIGH (ref 70–99)
Potassium: 3.7 mmol/L (ref 3.5–5.1)
Sodium: 140 mmol/L (ref 135–145)
Total Bilirubin: 0.4 mg/dL (ref 0.3–1.2)
Total Protein: 7.7 g/dL (ref 6.5–8.1)

## 2019-05-04 LAB — CK: Total CK: 91 U/L (ref 38–234)

## 2019-05-04 LAB — LIPASE, BLOOD: Lipase: 25 U/L (ref 11–51)

## 2019-05-04 MED ORDER — PREDNISONE 50 MG PO TABS: 50.0000 mg | ORAL_TABLET | Freq: Every day | ORAL | 0 refills | Status: DC

## 2019-05-04 MED ORDER — SODIUM CHLORIDE 0.9% FLUSH: 3.0000 mL | Freq: Once | INTRAVENOUS | Status: DC

## 2019-05-04 MED ORDER — KETOROLAC TROMETHAMINE 60 MG/2ML IM SOLN
60.0000 mg | Freq: Once | INTRAMUSCULAR | Status: AC
  Administered 2019-05-04: 13:00:00 60 mg via INTRAMUSCULAR
  Filled 2019-05-04: qty 2

## 2019-05-04 NOTE — ED Provider Notes (Signed)
[facility] EMERGENCY DEPARTMENT Provider Note   CSN: [id number] Arrival date & time: 05/04/19  1101     History Chief Complaint  Patient presents with   Leg Pain    Ann Jarvis is a 54 y.o. female with PMH significant for chronic osteoarthritic pain and cocaine use who presents to the ED with complaints of atraumatic right hip and right leg pain.  She reports that she has a history of osteoarthritis and her left knee always hurts.  She also reports that her right hip will often have an osteoarthritic flare that feels similar, however this is the first time that she has experienced the upper right leg pain and discomfort.  She endorses mild crampy abdominal discomfort, but no significant pain.  She denies any obvious injury, fevers or chills, chest pain or difficulty breathing, recent illness, nausea or vomiting, change in bowel habits, vaginal pain, vaginal discharge or bleeding, dyspareunia, urinary symptoms, numbness, or other neurologic dysfunction.  She denies any IVDA, however admits to regular cocaine use.  While her blood pressure is significantly elevated here in the ED, she denies any visual deficits, dizziness, chest pain or shortness of breath, or other symptoms concerning for hypertensive urgency/emergency.  HPI     Past Medical History:  Diagnosis Date   Cocaine abuse (HCC)    Grave's disease    s/p radioiodine ablation 03/05/10, F/B Dr. Chestine Spore. 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     No family history on file.  Social History   Tobacco Use   Smoking status: Current Some Day Smoker    Packs/day: 0.25    Types: Cigarettes   Smokeless tobacco: Never Used   Tobacco comment: 1 cig a day  Substance Use Topics   Alcohol use: Yes    Alcohol/week: 1.0 - 2.0 standard drinks    Types: 1 - 2 Cans of beer per week    Comment: occ   Drug use: Yes    Types: Marijuana    Comment: marijuana    Home Medications Prior to Admission medications   Medication Sig Start Date End Date Taking? Authorizing Provider  celecoxib (CELEBREX) 200 MG capsule Take 1 capsule (200 mg total) by mouth daily. 03/19/19  Yes Marcine Matar, MD  hydrochlorothiazide (HYDRODIURIL) 25 MG tablet Take 1 tablet (25 mg total) by mouth daily. Must keep appointment in March for more refills. 03/19/19  Yes Marcine Matar, MD  labetalol (NORMODYNE) 100 MG tablet Take 1 tablet (100 mg total) by mouth 2 (two) times daily. 03/19/19  Yes Marcine Matar, MD  levothyroxine (SYNTHROID) 137 MCG tablet Take 1 tablet (137 mcg total) by mouth daily before breakfast. 03/19/19  Yes Marcine Matar, MD  losartan (COZAAR) 100 MG tablet Take 1 tablet (100 mg total) by mouth daily. Must keep  appointment in March for more refills. 03/19/19  Yes Marcine Matar, MD  predniSONE (DELTASONE) 50 MG tablet Take 1 tablet (50 mg total) by mouth daily with breakfast. 05/04/19   Lorelee New, PA-C    Allergies    Penicillins  Review of Systems   Review of Systems  Constitutional: Negative for chills and fever.  Musculoskeletal: Positive for myalgias.  Skin: Negative for color change and wound.  Neurological: Negative for weakness and numbness.    Physical Exam Updated Vital Signs BP (!) 199/89    Pulse (!) 54    Temp 97.8 F (36.6 C) (Oral)    Resp 18    Ht 5\' 11"  (1.803 m)    LMP 10/07/2015    SpO2 100%     BMI 26.50 kg/m   Physical Exam Vitals and nursing note reviewed. Exam conducted with a chaperone present.  Constitutional:      Appearance: Normal appearance. She is not ill-appearing.  HENT:     Head: Normocephalic and atraumatic.  Eyes:     General: No scleral icterus.    Extraocular Movements: Extraocular movements intact.     Conjunctiva/sclera: Conjunctivae normal.     Pupils: Pupils are equal, round, and reactive to light.  Cardiovascular:     Rate and Rhythm: Normal rate and regular rhythm.     Pulses: Normal pulses.     Heart sounds: Normal heart sounds.  Pulmonary:     Effort: Pulmonary effort is normal. No respiratory distress.     Breath sounds: Normal breath sounds.  Musculoskeletal:     Cervical back: Normal range of motion and neck supple. No rigidity.     Comments: Right hip: TTP over iliac crest.  Pain with active ROM.  No pain with passive ROM.   Right leg: Full ROM and strength intact.  Pain with ROM, particularly flexion extension of hip and knee.  No pain with dorsiflexion or plantar flexion.  Sensation intact throughout.  Distal pulses intact.  No overlying skin changes, swelling, or other obvious deformity.  Skin:    General: Skin is dry.  Neurological:     General: No focal deficit present.     Mental Status: She is alert and oriented to person, place, and time.     GCS: GCS eye subscore is 4. GCS verbal subscore is 5. GCS motor subscore is 6.     Cranial Nerves: No cranial nerve deficit.     Sensory: No sensory deficit.     Motor: No weakness.     Coordination: Coordination normal.  Psychiatric:        Mood and Affect: Mood normal.        Behavior: Behavior normal.        Thought Content: Thought content normal.     ED Results / Procedures / Treatments   Labs (all labs ordered are listed, but only abnormal results are displayed) Labs Reviewed  COMPREHENSIVE METABOLIC PANEL - Abnormal; Notable for the following components:      Result Value    Glucose, Bld 112 (*)    Creatinine, Ser 1.35 (*)    Albumin 3.4 (*)    GFR calc non Af Amer 44 (*)    GFR calc Af Amer 51 (*)    All other components within normal limits  LIPASE, BLOOD  CBC  CK  URINALYSIS, ROUTINE W REFLEX MICROSCOPIC    EKG None  Radiology No results found.  Procedures Procedures (including critical care time)  Medications  Ordered in ED Medications  sodium chloride flush (NS) 0.9 % injection 3 mL (has no administration in time range)  ketorolac (TORADOL) injection 60 mg (60 mg Intramuscular Given 05/04/19 1244)    ED Course  I have reviewed the triage vital signs and the nursing notes.  Pertinent labs & imaging results that were available during my care of the patient were reviewed by me and considered in my medical decision making (see chart for details).    MDM Rules/Calculators/A&P                      Contrary to triage notes, on my exam patient is not complaining of any abdominal discomfort and her abdominal exam is entirely benign.  She admits that her right hip discomfort feels comfortable to some of her prior osteoarthritic flareups, however she does not usually experience her right thigh discomfort.  She is able to exhibit full ROM and strength and can ambulate, albeit with discomfort.  I personally reviewed patient's past medical record which shows that her blood pressure is persistently elevated to the level at which it is today.  She denies any headache or dizziness, chest pain or difficulty breathing, back pain, or other symptoms otherwise concerning for hypertensive urgency/emergency.  Her CMP demonstrated a mild renal impairment, however not particularly remarkable.  CK obtained given her atraumatic aching in conjunction with her cocaine use which was within normal limits.  Her other lab work has also been reassuring.  She is hemodynamically stable and neurovascularly intact with no reported numbness or weakness.  We will consult transition of  care team to speak with patient regarding her inability to afford her antihypertensive medications.  She reports that she will be following up with her primary care provider at [facility], Dr. Karle Plumber, at some point this month.  I strongly encouraged her to follow-up in the next 2 to 3 days.  In the meantime, we will discharge her with a prednisone burst.  She feels improved with IM Toradol here in the ED.  She is pleased with conversation with Transition of care team.  She plans to schedule an appointment with her primary care provider soon as possible and will take her antihypertensive medications, as prescribed.  Emphasized the importance of doing so to avoid complication such as stroke from uncontrolled high blood pressure.  She was able to dress herself entirely and can ambulate around the room without difficulty.  Discussed case with Dr. Alvino Chapel and he agrees with assessment and plan.  Very strict return precautions discussed with the patient.  All of the evaluation and work-up results were discussed with the patient and any family at bedside. They were provided opportunity to ask any additional questions and have none at this time. They have expressed understanding of verbal discharge instructions as well as return precautions and are agreeable to the plan.    Final Clinical Impression(s) / ED Diagnoses Final diagnoses:  Right leg pain    Rx / DC Orders ED Discharge Orders         Ordered    predniSONE (DELTASONE) 50 MG tablet  Daily with breakfast     05/04/19 1337           Corena Herter, PA-C 05/04/19 1337    Davonna Belling, MD 05/04/19 [phone]

## 2019-05-04 NOTE — Discharge Instructions (Addendum)
Please schedule appointment with your primary care provider soon as possible.  You need to continue taking your antihypertensive medication, as prescribed.  I have prescribed you a prednisone burst to take for your hip and leg discomfort.  I suspect musculoskeletal etiology.  I have also put in a referral for you to see orthopedics.  Please reach out to them should you continue experience pain discomfort despite rest and appropriate medication.  Please return to the ED or seek medication attention for any new or worsening symptoms.    Please return to the ED or seek immediate medical attention for any new or worsening symptoms.

## 2019-05-04 NOTE — ED Notes (Signed)
Patient verbalizes understanding of discharge instructions. Opportunity for questioning and answers were provided. Armband removed by staff, pt discharged from ED.  

## 2019-05-04 NOTE — ED Triage Notes (Signed)
C/o bilateral lower leg pain, R hip pain, and lower abd pain x 2 days.  Denies nausea, vomiting, and diarrhea.  Relates leg pain and hip pain to arthritis.

## 2019-05-05 MED FILL — HYDROCHLOROTHIAZIDE 25 MG T: 25 | 30 days supply | Qty: 30 | Fill #1

## 2019-05-05 MED FILL — LEVOTHYROXINE 137 MCG TAB: 137 | 30 days supply | Qty: 30 | Fill #0

## 2019-05-05 MED FILL — LOSARTAN POTASSIUM 100 MG T: 100 | 30 days supply | Qty: 30 | Fill #0

## 2019-05-18 ENCOUNTER — Ambulatory Visit: Payer: No Typology Code available for payment source | Admitting: Internal Medicine

## 2019-06-07 ENCOUNTER — Ambulatory Visit: Payer: Self-pay | Admitting: Internal Medicine

## 2019-06-15 MED FILL — LEVOTHYROXINE 137 MCG TAB: 137 | 30 days supply | Qty: 30 | Fill #1

## 2019-06-15 MED FILL — HYDROCHLOROTHIAZIDE 25 MG T: 25 | 30 days supply | Qty: 30 | Fill #2

## 2019-06-15 MED FILL — LOSARTAN POTASSIUM 100 MG T: 100 | 30 days supply | Qty: 30 | Fill #1

## 2019-07-16 ENCOUNTER — Other Ambulatory Visit: Payer: Self-pay

## 2019-07-16 ENCOUNTER — Encounter: Payer: Self-pay | Admitting: Internal Medicine

## 2019-07-16 ENCOUNTER — Ambulatory Visit: Payer: Self-pay | Attending: Internal Medicine | Admitting: Internal Medicine

## 2019-07-16 DIAGNOSIS — K219 Gastro-esophageal reflux disease without esophagitis: Secondary | ICD-10-CM

## 2019-07-16 DIAGNOSIS — M159 Polyosteoarthritis, unspecified: Secondary | ICD-10-CM

## 2019-07-16 DIAGNOSIS — E890 Postprocedural hypothyroidism: Secondary | ICD-10-CM

## 2019-07-16 DIAGNOSIS — I10 Essential (primary) hypertension: Secondary | ICD-10-CM

## 2019-07-16 MED ORDER — CELECOXIB 200 MG PO CAPS: 200.0000 mg | ORAL_CAPSULE | Freq: Every day | ORAL | 6 refills | Status: DC

## 2019-07-16 MED ORDER — LABETALOL HCL 100 MG PO TABS: 100.0000 mg | ORAL_TABLET | Freq: Two times a day (BID) | ORAL | 6 refills | Status: DC

## 2019-07-16 MED ORDER — LEVOTHYROXINE SODIUM 137 MCG PO TABS: 137.0000 ug | ORAL_TABLET | Freq: Every day | ORAL | 6 refills | Status: DC

## 2019-07-16 MED ORDER — LOSARTAN POTASSIUM 100 MG PO TABS: 100.0000 mg | ORAL_TABLET | Freq: Every day | ORAL | 6 refills | Status: DC

## 2019-07-16 MED ORDER — OMEPRAZOLE 20 MG PO CPDR: 20.0000 mg | DELAYED_RELEASE_CAPSULE | Freq: Every day | ORAL | 3 refills | Status: DC

## 2019-07-16 MED ORDER — HYDROCHLOROTHIAZIDE 25 MG PO TABS: 25.0000 mg | ORAL_TABLET | Freq: Every day | ORAL | 6 refills | Status: DC

## 2019-07-16 MED FILL — LABETALOL HCL 100 MG TABS: 100 | 30 days supply | Qty: 60 | Fill #0

## 2019-07-16 MED FILL — LOSARTAN POTASSIUM 100 MG T: 100 | 30 days supply | Qty: 30 | Fill #0

## 2019-07-16 MED FILL — LEVOTHYROXINE 137 MCG TAB: 137 | 30 days supply | Qty: 30 | Fill #0

## 2019-07-16 MED FILL — CELECOXIB 200 MG CAPSULE: 200 | 30 days supply | Qty: 30 | Fill #0

## 2019-07-16 MED FILL — HYDROCHLOROTHIAZIDE 25 MG T: 25 | 30 days supply | Qty: 30 | Fill #0

## 2019-07-16 NOTE — Progress Notes (Signed)
Stomach, Per pt she went to the hospital and they told her it was gas   Med refills

## 2019-07-16 NOTE — Progress Notes (Signed)
Virtual Visit via Telephone Note Due to current restrictions/limitations of in-office visits due to the COVID-19 pandemic, this scheduled clinical appointment was converted to a telehealth visit  I connected with Ann Jarvis on 07/16/19 at 3:48 p.m by telephone and verified that I am speaking with the correct person using two identifiers. I am in my office.  The patient is at home.  Only the patient and myself participated in this encounter.  I discussed the limitations, risks, security and privacy concerns of performing an evaluation and management service by telephone and the availability of in person appointments. I also discussed with the patient that there may be a patient responsible charge related to this service. The patient expressed understanding and agreed to proceed.   History of Present Illness: Hx HTN, Tob, Hypothyroid post treatment for Graves' disease, cocaine abuse, CKD stage 2-3.  Last eval 03/2019.  No showed 2 appts since last visit.  Complain of intermittent epigastric abdominal pain.  Worse when she eats Spicy foods, or drinks juices and "laying down eating." Takes Ibuprofen 3 tabs Q 2-3 days a week for arthritis pain.  I had put her on Celebrex but she states that lately she has only had money to be able to afford her blood pressure and thyroid medications.  I had prescribed omeprazole for her on our last visit.  She states that she did get the prescription filled but she does not take it every day.  States she takes it only when the abdominal pain gets bad especially after eating certain foods.  She has been afraid to eat and because of that she thinks she has lost several pounds.  No blood in the stools or changes in bowel habits. HTN: She reports compliance with taking blood pressure medications.  No device to check blood pressure however. Hypothyroidism: She reports compliance with levothyroxine.  Denies feeling hot or cold all the time.  She does report that she has  lost several pounds.   Outpatient Encounter Medications as of 07/16/2019  Medication Sig  . hydrochlorothiazide (HYDRODIURIL) 25 MG tablet Take 1 tablet (25 mg total) by mouth daily. Must keep appointment in March for more refills.  Marland Kitchen labetalol (NORMODYNE) 100 MG tablet Take 1 tablet (100 mg total) by mouth 2 (two) times daily.  Marland Kitchen losartan (COZAAR) 100 MG tablet Take 1 tablet (100 mg total) by mouth daily. Must keep appointment in March for more refills.  . celecoxib (CELEBREX) 200 MG capsule Take 1 capsule (200 mg total) by mouth daily. (Patient not taking: Reported on 07/16/2019)  . levothyroxine (SYNTHROID) 137 MCG tablet Take 1 tablet (137 mcg total) by mouth daily before breakfast. (Patient not taking: Reported on 07/16/2019)  . predniSONE (DELTASONE) 50 MG tablet Take 1 tablet (50 mg total) by mouth daily with breakfast. (Patient not taking: Reported on 07/16/2019)   No facility-administered encounter medications on file as of 07/16/2019.    Observations/Objective:   Chemistry      Component Value Date/Time   NA 140 05/04/2019 1122   NA 140 11/11/2017 1431   K 3.7 05/04/2019 1122   CL 104 05/04/2019 1122   CO2 27 05/04/2019 1122   BUN 16 05/04/2019 1122   BUN 13 11/11/2017 1431   CREATININE 1.35 (H) 05/04/2019 1122   CREATININE 1.11 (H) 05/07/2016 1409      Component Value Date/Time   CALCIUM 8.9 05/04/2019 1122   ALKPHOS 93 05/04/2019 1122   AST 21 05/04/2019 1122   ALT 16 05/04/2019 1122  BILITOT 0.4 05/04/2019 1122   BILITOT 0.3 11/11/2017 1431     Lab Results  Component Value Date   WBC 5.4 05/04/2019   HGB 13.6 05/04/2019   HCT 43.6 05/04/2019   MCV 89.5 05/04/2019   PLT 216 05/04/2019     Assessment and Plan: 1. Gastroesophageal reflux disease without esophagitis Advised patient to take the omeprazole every day.  I will send a refill to the pharmacy. GERD precautions discussed.  Advised to avoid foods that make reflux symptoms worse including tomato-based  foods, juices, spicy foods.  Advised to sleep with her head slightly elevated and to eat her last meal at least 2 to 3 hours before laying down at night  2. Essential hypertension - hydrochlorothiazide (HYDRODIURIL) 25 MG tablet; Take 1 tablet (25 mg total) by mouth daily. Must keep appointment in March for more refills.  Dispense: 30 tablet; Refill: 6 - labetalol (NORMODYNE) 100 MG tablet; Take 1 tablet (100 mg total) by mouth 2 (two) times daily.  Dispense: 60 tablet; Refill: 6 - losartan (COZAAR) 100 MG tablet; Take 1 tablet (100 mg total) by mouth daily. Must keep appointment in March for more refills.  Dispense: 30 tablet; Refill: 6  3. Postoperative hypothyroidism - levothyroxine (SYNTHROID) 137 MCG tablet; Take 1 tablet (137 mcg total) by mouth daily before breakfast.  Dispense: 30 tablet; Refill: 6 - TSH; Future  4. Osteoarthritis of multiple joints, unspecified osteoarthritis type Advised to stop ibuprofen.  Refill sent on Celebrex - celecoxib (CELEBREX) 200 MG capsule; Take 1 capsule (200 mg total) by mouth daily.  Dispense: 30 capsule; Refill: 6   Follow Up Instructions: I would like for her to follow-up as previously planned to have her Pap smear done.  Patient states that she will call to schedule when she has transportation.   I discussed the assessment and treatment plan with the patient. The patient was provided an opportunity to ask questions and all were answered. The patient agreed with the plan and demonstrated an understanding of the instructions.   The patient was advised to call back or seek an in-person evaluation if the symptoms worsen or if the condition fails to improve as anticipated.  I provided 10 minutes of non-face-to-face time during this encounter.   Karle Plumber, MD

## 2019-07-19 MED FILL — OMEPRAZOLE 20 MG CAP: 20 | 30 days supply | Qty: 30 | Fill #0

## 2019-09-17 MED FILL — LEVOTHYROXINE 137 MCG TAB: 137 | 30 days supply | Qty: 30 | Fill #1

## 2019-09-17 MED FILL — LOSARTAN POTASSIUM 100 MG T: 100 | 30 days supply | Qty: 30 | Fill #1

## 2019-10-18 MED FILL — HYDROCHLOROTHIAZIDE 25 MG T: 25 | 30 days supply | Qty: 30 | Fill #3

## 2019-10-18 MED FILL — LOSARTAN POTASSIUM 100 MG T: 100 | 30 days supply | Qty: 30 | Fill #2

## 2019-10-18 MED FILL — LEVOTHYROXINE 137 MCG TAB: 137 | 30 days supply | Qty: 30 | Fill #2

## 2019-10-18 MED FILL — LABETALOL HCL 100 MG TABS: 100 | 30 days supply | Qty: 60 | Fill #1

## 2019-10-28 ENCOUNTER — Encounter (HOSPITAL_COMMUNITY): Payer: Self-pay

## 2019-10-28 ENCOUNTER — Emergency Department (HOSPITAL_COMMUNITY): Payer: Self-pay

## 2019-10-28 ENCOUNTER — Other Ambulatory Visit: Payer: Self-pay

## 2019-10-28 ENCOUNTER — Emergency Department (HOSPITAL_COMMUNITY)
Admission: EM | Admit: 2019-10-28 | Discharge: 2019-10-28 | Disposition: A | Discharge status: Home / Self Care | Payer: Self-pay | Attending: Emergency Medicine | Admitting: Emergency Medicine

## 2019-10-28 DIAGNOSIS — E039 Hypothyroidism, unspecified: Secondary | ICD-10-CM | POA: Insufficient documentation

## 2019-10-28 DIAGNOSIS — F17210 Nicotine dependence, cigarettes, uncomplicated: Secondary | ICD-10-CM | POA: Insufficient documentation

## 2019-10-28 DIAGNOSIS — Z79890 Hormone replacement therapy (postmenopausal): Secondary | ICD-10-CM | POA: Insufficient documentation

## 2019-10-28 DIAGNOSIS — E860 Dehydration: Secondary | ICD-10-CM | POA: Insufficient documentation

## 2019-10-28 DIAGNOSIS — Z79899 Other long term (current) drug therapy: Secondary | ICD-10-CM | POA: Insufficient documentation

## 2019-10-28 DIAGNOSIS — R112 Nausea with vomiting, unspecified: Secondary | ICD-10-CM | POA: Insufficient documentation

## 2019-10-28 DIAGNOSIS — R1032 Left lower quadrant pain: Secondary | ICD-10-CM | POA: Insufficient documentation

## 2019-10-28 DIAGNOSIS — I10 Essential (primary) hypertension: Secondary | ICD-10-CM | POA: Insufficient documentation

## 2019-10-28 LAB — CBC WITH DIFFERENTIAL/PLATELET
Abs Immature Granulocytes: 0.05 10*3/uL (ref 0.00–0.07)
Basophils Absolute: 0.1 10*3/uL (ref 0.0–0.1)
Basophils Relative: 0 %
Eosinophils Absolute: 0.1 10*3/uL (ref 0.0–0.5)
Eosinophils Relative: 0 %
HCT: 51.5 % — ABNORMAL HIGH (ref 36.0–46.0)
Hemoglobin: 16.3 g/dL — ABNORMAL HIGH (ref 12.0–15.0)
Immature Granulocytes: 0 %
Lymphocytes Relative: 11 %
Lymphs Abs: 1.4 10*3/uL (ref 0.7–4.0)
MCH: 28.2 pg (ref 26.0–34.0)
MCHC: 31.7 g/dL (ref 30.0–36.0)
MCV: 89.1 fL (ref 80.0–100.0)
Monocytes Absolute: 0.6 10*3/uL (ref 0.1–1.0)
Monocytes Relative: 5 %
Neutro Abs: 10.2 10*3/uL — ABNORMAL HIGH (ref 1.7–7.7)
Neutrophils Relative %: 84 %
Platelets: 238 10*3/uL (ref 150–400)
RBC: 5.78 MIL/uL — ABNORMAL HIGH (ref 3.87–5.11)
RDW: 13.4 % (ref 11.5–15.5)
WBC: 12.3 10*3/uL — ABNORMAL HIGH (ref 4.0–10.5)
nRBC: 0 % (ref 0.0–0.2)

## 2019-10-28 LAB — URINALYSIS, ROUTINE W REFLEX MICROSCOPIC
Bacteria, UA: NONE SEEN
Bilirubin Urine: NEGATIVE
Glucose, UA: NEGATIVE mg/dL
Ketones, ur: 20 mg/dL — AB
Leukocytes,Ua: NEGATIVE
Nitrite: NEGATIVE
Protein, ur: NEGATIVE mg/dL
Specific Gravity, Urine: 1.034 — ABNORMAL HIGH (ref 1.005–1.030)
pH: 7 (ref 5.0–8.0)

## 2019-10-28 LAB — RAPID URINE DRUG SCREEN, HOSP PERFORMED
Amphetamines: NOT DETECTED
Barbiturates: NOT DETECTED
Benzodiazepines: NOT DETECTED
Cocaine: POSITIVE — AB
Opiates: POSITIVE — AB
Tetrahydrocannabinol: NOT DETECTED

## 2019-10-28 LAB — COMPREHENSIVE METABOLIC PANEL
ALT: 27 U/L (ref 0–44)
AST: 32 U/L (ref 15–41)
Albumin: 3.8 g/dL (ref 3.5–5.0)
Alkaline Phosphatase: 62 U/L (ref 38–126)
Anion gap: 11 (ref 5–15)
BUN: 16 mg/dL (ref 6–20)
CO2: 26 mmol/L (ref 22–32)
Calcium: 9 mg/dL (ref 8.9–10.3)
Chloride: 101 mmol/L (ref 98–111)
Creatinine, Ser: 1.15 mg/dL — ABNORMAL HIGH (ref 0.44–1.00)
GFR calc Af Amer: >60 mL/min (ref >60)
GFR calc non Af Amer: 54 mL/min — ABNORMAL LOW (ref >60)
Glucose, Bld: 116 mg/dL — ABNORMAL HIGH (ref 70–99)
Potassium: 3.7 mmol/L (ref 3.5–5.1)
Sodium: 138 mmol/L (ref 135–145)
Total Bilirubin: 1.1 mg/dL (ref 0.3–1.2)
Total Protein: 8.3 g/dL — ABNORMAL HIGH (ref 6.5–8.1)

## 2019-10-28 LAB — TSH: TSH: 4.108 u[IU]/mL (ref 0.350–4.500)

## 2019-10-28 LAB — LIPASE, BLOOD: Lipase: 19 U/L (ref 11–51)

## 2019-10-28 MED ORDER — METRONIDAZOLE 500 MG PO TABS: 500.0000 mg | ORAL_TABLET | Freq: Two times a day (BID) | ORAL | 0 refills | Status: DC

## 2019-10-28 MED ORDER — CIPROFLOXACIN HCL 500 MG PO TABS: 500.0000 mg | ORAL_TABLET | Freq: Two times a day (BID) | ORAL | 0 refills | Status: DC

## 2019-10-28 MED ORDER — CIPROFLOXACIN HCL 500 MG PO TABS
500.0000 mg | ORAL_TABLET | Freq: Once | ORAL | Status: AC
  Administered 2019-10-28: 500 mg via ORAL
  Filled 2019-10-28: qty 1

## 2019-10-28 MED ORDER — ONDANSETRON HCL 4 MG/2ML IJ SOLN
4.0000 mg | Freq: Once | INTRAMUSCULAR | Status: AC
  Administered 2019-10-28: 4 mg via INTRAVENOUS
  Filled 2019-10-28: qty 2

## 2019-10-28 MED ORDER — SODIUM CHLORIDE 0.9 % IV BOLUS
1000.0000 mL | Freq: Once | INTRAVENOUS | Status: AC
  Administered 2019-10-28: 1000 mL via INTRAVENOUS

## 2019-10-28 MED ORDER — MORPHINE SULFATE (PF) 4 MG/ML IV SOLN
4.0000 mg | Freq: Once | INTRAVENOUS | Status: AC
  Administered 2019-10-28: 4 mg via INTRAVENOUS
  Filled 2019-10-28: qty 1

## 2019-10-28 MED ORDER — ONDANSETRON HCL 4 MG PO TABS: 4.0000 mg | ORAL_TABLET | Freq: Three times a day (TID) | ORAL | 0 refills | Status: DC | PRN

## 2019-10-28 MED ORDER — METRONIDAZOLE 500 MG PO TABS
500.0000 mg | ORAL_TABLET | Freq: Once | ORAL | Status: AC
  Administered 2019-10-28: 500 mg via ORAL
  Filled 2019-10-28: qty 1

## 2019-10-28 MED ORDER — IOHEXOL 300 MG/ML  SOLN
100.0000 mL | Freq: Once | INTRAMUSCULAR | Status: AC | PRN
  Administered 2019-10-28: 100 mL via INTRAVENOUS

## 2019-10-28 NOTE — ED Notes (Signed)
An After Visit Summary was printed and given to the patient. Discharge instructions given and no further questions at this time.  

## 2019-10-28 NOTE — ED Provider Notes (Signed)
[facility]-EMERGENCY DEPT Provider Note   CSN: [id number] Arrival date & time: 10/28/19  1938     History Chief Complaint  Patient presents with  . Abdominal Pain  . Emesis    Ann Jarvis is a 54 y.o. female.  Pt presents to the ED today with n/v and abd pain.  Pt said pain started this am.  She has been unable to keep down any fluids today.  EMS said she was diaphoretic when they arrived.  This has improved with 4 mg of zofran and some fluids.  No f/c.  No cough or sob.        Past Medical History:  Diagnosis Date  . Cocaine abuse (HCC)   . Grave's disease    s/p radioiodine ablation 03/05/10, F/B Dr. Chestine Spore. On Methimazole  . Hypertension   . Hyperthyroidism   . Scabies   . Thyroid condition   . Tobacco abuse     Patient Active Problem List   Diagnosis Date Noted  . Gastroesophageal reflux disease without esophagitis 07/16/2019  . Hot flashes 03/13/2017  . Chronic pain of right knee 12/12/2016  . Chronic pain of left ankle 12/12/2016  . 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 Hypothyroidism 11/13/2015  . Bell's palsy 10/09/2011  . TOBACCO ABUSE 10/10/2008  . COCAINE ABUSE 08/17/2007  . Essential hypertension 08/17/2007    Past Surgical History:  Procedure Laterality Date  . BTL    . MOUTH SURGERY       OB History   No obstetric history on file.     History reviewed. No pertinent family history.  Social History   Tobacco Use  . Smoking status: Current Some Day Smoker    Packs/day: 0.25    Types: Cigarettes  . Smokeless tobacco: Never Used  . Tobacco comment: 1 cig a day  Substance Use Topics  . Alcohol use: Yes    Alcohol/week: 1.0 - 2.0 standard drink    Types: 1 - 2 Cans of beer per week    Comment: occ  . Drug use: Yes    Types: Marijuana    Comment: marijuana    Home Medications Prior to Admission medications   Medication Sig Start Date End Date Taking? Authorizing Provider  celecoxib (CELEBREX) 200 MG  capsule Take 1 capsule (200 mg total) by mouth daily. 07/16/19  Yes Marcine Matar, MD  hydrochlorothiazide (HYDRODIURIL) 25 MG tablet Take 1 tablet (25 mg total) by mouth daily. Must keep appointment in March for more refills. 07/16/19  Yes Marcine Matar, MD  labetalol (NORMODYNE) 100 MG tablet Take 1 tablet (100 mg total) by mouth 2 (two) times daily. 07/16/19  Yes Marcine Matar, MD  levothyroxine (SYNTHROID) 137 MCG tablet Take 1 tablet (137 mcg total) by mouth daily before breakfast. 07/16/19  Yes Marcine Matar, MD  losartan (COZAAR) 100 MG tablet Take 1 tablet (100 mg total) by mouth daily. Must keep appointment in March for more refills. 07/16/19  Yes Marcine Matar, MD  omeprazole (PRILOSEC) 20 MG capsule Take 1 capsule (20 mg total) by mouth daily. 07/16/19  Yes Marcine Matar, MD  ciprofloxacin (CIPRO) 500 MG tablet Take 1 tablet (500 mg total) by mouth 2 (two) times daily. 10/28/19   Jacalyn Lefevre, MD  metroNIDAZOLE (FLAGYL) 500 MG tablet Take 1 tablet (500 mg total) by mouth 2 (two) times daily. 10/28/19   Jacalyn Lefevre, MD  ondansetron (ZOFRAN) 4 MG tablet Take 1 tablet (4 mg  total) by mouth every 8 (eight) hours as needed for nausea or vomiting. 10/28/19   Jacalyn LefevreHaviland, Timiyah Romito, MD    Allergies    Penicillins  Review of Systems   Review of Systems  Gastrointestinal: Positive for abdominal pain, nausea and vomiting.  All other systems reviewed and are negative.   Physical Exam Updated Vital Signs BP (!) 199/76   Pulse 50   Temp 98 F (36.7 C) (Oral)   Resp 18   Ht 5\' 11"  (1.803 m)   Wt 72.6 kg   LMP 10/07/2015   SpO2 97%   BMI 22.32 kg/m   Physical Exam Vitals and nursing note reviewed.  Constitutional:      Appearance: She is well-developed.  HENT:     Head: Normocephalic and atraumatic.     Mouth/Throat:     Mouth: Mucous membranes are moist.     Pharynx: Oropharynx is clear.  Eyes:     Extraocular Movements: Extraocular movements intact.      Pupils: Pupils are equal, round, and reactive to light.  Cardiovascular:     Rate and Rhythm: Normal rate and regular rhythm.     Heart sounds: Normal heart sounds.  Pulmonary:     Effort: Pulmonary effort is normal.     Breath sounds: Normal breath sounds.  Abdominal:     General: Abdomen is flat.     Palpations: Abdomen is soft.     Tenderness: There is abdominal tenderness in the left lower quadrant.  Skin:    General: Skin is warm.     Capillary Refill: Capillary refill takes less than 2 seconds.  Neurological:     General: No focal deficit present.     Mental Status: She is alert and oriented to person, place, and time.  Psychiatric:        Mood and Affect: Mood normal.        Behavior: Behavior normal.     ED Results / Procedures / Treatments   Labs (all labs ordered are listed, but only abnormal results are displayed) Labs Reviewed  CBC WITH DIFFERENTIAL/PLATELET - Abnormal; Notable for the following components:      Result Value   WBC 12.3 (*)    RBC 5.78 (*)    Hemoglobin 16.3 (*)    HCT 51.5 (*)    Neutro Abs 10.2 (*)    All other components within normal limits  COMPREHENSIVE METABOLIC PANEL - Abnormal; Notable for the following components:   Glucose, Bld 116 (*)    Creatinine, Ser 1.15 (*)    Total Protein 8.3 (*)    GFR calc non Af Amer 54 (*)    All other components within normal limits  URINALYSIS, ROUTINE W REFLEX MICROSCOPIC - Abnormal; Notable for the following components:   Color, Urine STRAW (*)    Specific Gravity, Urine 1.034 (*)    Hgb urine dipstick SMALL (*)    Ketones, ur 20 (*)    All other components within normal limits  RAPID URINE DRUG SCREEN, HOSP PERFORMED - Abnormal; Notable for the following components:   Opiates POSITIVE (*)    Cocaine POSITIVE (*)    All other components within normal limits  LIPASE, BLOOD  TSH    EKG None  Radiology CT ABDOMEN PELVIS W CONTRAST  Result Date: 10/28/2019 CLINICAL DATA:  Nausea vomiting  diarrhea abdominal pain EXAM: CT ABDOMEN AND PELVIS WITH CONTRAST TECHNIQUE: Multidetector CT imaging of the abdomen and pelvis was performed using the standard protocol following  bolus administration of intravenous contrast. CONTRAST:  OMNIPAQUE IOHEXOL 300 MG/ML  SOLN COMPARISON:  None. FINDINGS: Lower chest: The visualized heart size within normal limits. No pericardial fluid/thickening. No hiatal hernia. The visualized portions of the lungs are clear. Hepatobiliary: The liver is normal in density without focal abnormality.The main portal vein is patent. No evidence of calcified gallstones, gallbladder wall thickening or biliary dilatation. Pancreas: Unremarkable. No pancreatic ductal dilatation or surrounding inflammatory changes. Spleen: Normal in size without focal abnormality. Adrenals/Urinary Tract: Both adrenal glands appear normal. The kidneys and collecting system appear normal without evidence of urinary tract calculus or hydronephrosis. Bladder is unremarkable. Stomach/Bowel: The stomach is normal in appearance. There is diffuse wall thickening seen within the mid and right abdomen within several jejunal loops with hyperenhancement and surrounding mild mesenteric fat stranding changes. There is also adjacent mesenteric edema. The distal ileal loops are decompressed and normal in appearance. There is scattered colonic diverticula, however the remainder of the colon is unremarkable. The appendix is not well visualized, however no stranding changes seen within the right lower quadrant. Vascular/Lymphatic: There are no enlarged mesenteric, retroperitoneal, or pelvic lymph nodes. No significant vascular findings are present. Reproductive: A heterogeneously hyperdense probable posterior uterine fibroid is seen. There is tiny calcifications seen within the uterus. Other: There is a small amount of abdominopelvic ascites tracking in the perihepatic space and pericolic gutters as well as within the deep  pelvis. Musculoskeletal: No acute or significant osseous findings. IMPRESSION: 1. Findings consistent with diffuse enteritis involving the jejunal loops in the mid abdomen. No definite free air or surrounding loculated fluid collections. 2. Small amount of abdominopelvic ascites. Electronically Signed   By: Jonna Clark M.D.   On: 10/28/2019 21:21    Procedures Procedures (including critical care time)  Medications Ordered in ED Medications  ciprofloxacin (CIPRO) tablet 500 mg (has no administration in time range)  metroNIDAZOLE (FLAGYL) tablet 500 mg (has no administration in time range)  sodium chloride 0.9 % bolus 1,000 mL (0 mLs Intravenous Stopped 10/28/19 2137)  morphine 4 MG/ML injection 4 mg (4 mg Intravenous Given 10/28/19 2014)  ondansetron (ZOFRAN) injection 4 mg (4 mg Intravenous Given 10/28/19 2013)  iohexol (OMNIPAQUE) 300 MG/ML solution 100 mL (100 mLs Intravenous Contrast Given 10/28/19 2058)    ED Course  I have reviewed the triage vital signs and the nursing notes.  Pertinent labs & imaging results that were available during my care of the patient were reviewed by me and considered in my medical decision making (see chart for details).    MDM Rules/Calculators/A&P                          Pt is feeling much better.  She is able to tolerate po fluids.  She is hypertensive, but has been unable to take her bp meds today due to vomiting.  Pt is stable for d/c.  Return if worse. Final Clinical Impression(s) / ED Diagnoses Final diagnoses:  Left lower quadrant abdominal pain  Dehydration  Non-intractable vomiting with nausea, unspecified vomiting type    Rx / DC Orders ED Discharge Orders         Ordered    ondansetron (ZOFRAN) 4 MG tablet  Every 8 hours PRN     Discontinue  Reprint     10/28/19 2228    ciprofloxacin (CIPRO) 500 MG tablet  2 times daily     Discontinue  Reprint     10/28/19  2228    metroNIDAZOLE (FLAGYL) 500 MG tablet  2 times daily     Discontinue   Reprint     10/28/19 2228           Jacalyn Lefevre, MD 10/28/19 2229

## 2019-10-28 NOTE — ED Triage Notes (Signed)
Per EMS, Pt woke up this morning with N/V/D, pt also endorses abd pain. EMS states pt did have an episode of projectile vomiting. Pt Given 4mg  zofran and NS fluid by EMS.

## 2019-10-29 MED FILL — LOSARTAN POTASSIUM 100 MG T: 100 | 30 days supply | Qty: 30 | Fill #2

## 2019-10-29 MED FILL — CIPROFLOXACIN HCL 500 MG TA: 500 | 7 days supply | Qty: 14 | Fill #0

## 2019-10-29 MED FILL — HYDROCHLOROTHIAZIDE 25 MG T: 25 | 30 days supply | Qty: 30 | Fill #1

## 2019-10-29 MED FILL — metroNIDAZOLE 500 MG TABS: 500 | 7 days supply | Qty: 14 | Fill #0

## 2019-11-04 ENCOUNTER — Telehealth: Payer: Self-pay | Admitting: Internal Medicine

## 2019-11-04 NOTE — Telephone Encounter (Signed)
Called patient to schedule an appt with PCP but the phone number on file is wrong.   Copied from CRM [id number]. Topic: Appointment Scheduling - Scheduling Inquiry for Clinic >> Nov 02, 2019  2:27 PM Leafy Ro wrote: Pt went to [facility] er on 10/30/2019 and pt said her discharge paperwork said to make an appt asap. Pt dx with stomach infection >> Nov 03, 2019  2:09 PM Angela Nevin wrote: Patient calling back about this request.     [phone]  >> Nov 02, 2019  2:33 PM Leafy Ro wrote: Correction pt went to er on 10/28/2019 not 10/30/2019

## 2019-11-05 ENCOUNTER — Encounter (HOSPITAL_COMMUNITY): Payer: Self-pay | Admitting: Emergency Medicine

## 2019-11-05 ENCOUNTER — Emergency Department (HOSPITAL_COMMUNITY)
Admission: EM | Admit: 2019-11-05 | Discharge: 2019-11-05 | Disposition: A | Discharge status: Home / Self Care | Payer: Self-pay | Attending: Emergency Medicine | Admitting: Emergency Medicine

## 2019-11-05 ENCOUNTER — Other Ambulatory Visit: Payer: Self-pay

## 2019-11-05 DIAGNOSIS — L509 Urticaria, unspecified: Secondary | ICD-10-CM | POA: Insufficient documentation

## 2019-11-05 DIAGNOSIS — I10 Essential (primary) hypertension: Secondary | ICD-10-CM | POA: Insufficient documentation

## 2019-11-05 DIAGNOSIS — Z79899 Other long term (current) drug therapy: Secondary | ICD-10-CM | POA: Insufficient documentation

## 2019-11-05 DIAGNOSIS — E039 Hypothyroidism, unspecified: Secondary | ICD-10-CM | POA: Insufficient documentation

## 2019-11-05 DIAGNOSIS — F17210 Nicotine dependence, cigarettes, uncomplicated: Secondary | ICD-10-CM | POA: Insufficient documentation

## 2019-11-05 DIAGNOSIS — Z79890 Hormone replacement therapy (postmenopausal): Secondary | ICD-10-CM | POA: Insufficient documentation

## 2019-11-05 MED ORDER — FAMOTIDINE 20 MG PO TABS
20.0000 mg | ORAL_TABLET | Freq: Once | ORAL | Status: AC
  Administered 2019-11-05: 20 mg via ORAL
  Filled 2019-11-05: qty 1

## 2019-11-05 MED ORDER — CETIRIZINE HCL 5 MG/5ML PO SOLN
5.0000 mg | Freq: Once | ORAL | Status: DC
  Filled 2019-11-05: qty 5

## 2019-11-05 NOTE — ED Provider Notes (Signed)
[facility] EMERGENCY DEPARTMENT Provider Note   CSN: [id number] Arrival date & time: 11/05/19  1235     History Chief Complaint  Patient presents with  . Rash    Ann Jarvis is a 54 y.o. female.  54 year old female presents with complaint of a burning rash to her extremities onset late last night.  Patient is currently taking Cipro and Flagyl for enteritis, took her last dose this morning.  Patient states that she felt her throat was burning as well.  Patient took Benadryl last night and again this morning.  Denies difficulty breathing or swallowing, nausea, vomiting.  Patient reports only having 1 dose left of her antibiotics.  No other complaints or concerns.        Past Medical History:  Diagnosis Date  . Cocaine abuse (HCC)   . Grave's disease    s/p radioiodine ablation 03/05/10, F/B Dr. Chestine Spore. On Methimazole  . Hypertension   . Hyperthyroidism   . Scabies   . Thyroid condition   . Tobacco abuse     Patient Active Problem List   Diagnosis Date Noted  . Gastroesophageal reflux disease without esophagitis 07/16/2019  . Hot flashes 03/13/2017  . Chronic pain of right knee 12/12/2016  . Chronic pain of left ankle 12/12/2016  . 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 Hypothyroidism 11/13/2015  . Bell's palsy 10/09/2011  . TOBACCO ABUSE 10/10/2008  . COCAINE ABUSE 08/17/2007  . Essential hypertension 08/17/2007    Past Surgical History:  Procedure Laterality Date  . BTL    . MOUTH SURGERY       OB History   No obstetric history on file.     No family history on file.  Social History   Tobacco Use  . Smoking status: Current Some Day Smoker    Packs/day: 0.25    Types: Cigarettes  . Smokeless tobacco: Never Used  . Tobacco comment: 1 cig a day  Substance Use Topics  . Alcohol use: Yes    Alcohol/week: 1.0 - 2.0 standard drink    Types: 1 - 2 Cans of beer per week    Comment: occ  . Drug use: Yes    Types: Marijuana     Comment: marijuana    Home Medications Prior to Admission medications   Medication Sig Start Date End Date Taking? Authorizing Provider  celecoxib (CELEBREX) 200 MG capsule Take 1 capsule (200 mg total) by mouth daily. 07/16/19   Marcine Matar, MD  ciprofloxacin (CIPRO) 500 MG tablet Take 1 tablet (500 mg total) by mouth 2 (two) times daily. 10/28/19   Jacalyn Lefevre, MD  hydrochlorothiazide (HYDRODIURIL) 25 MG tablet Take 1 tablet (25 mg total) by mouth daily. Must keep appointment in March for more refills. 07/16/19   Marcine Matar, MD  labetalol (NORMODYNE) 100 MG tablet Take 1 tablet (100 mg total) by mouth 2 (two) times daily. 07/16/19   Marcine Matar, MD  levothyroxine (SYNTHROID) 137 MCG tablet Take 1 tablet (137 mcg total) by mouth daily before breakfast. 07/16/19   Marcine Matar, MD  losartan (COZAAR) 100 MG tablet Take 1 tablet (100 mg total) by mouth daily. Must keep appointment in March for more refills. 07/16/19   Marcine Matar, MD  metroNIDAZOLE (FLAGYL) 500 MG tablet Take 1 tablet (500 mg total) by mouth 2 (two) times daily. 10/28/19   Jacalyn Lefevre, MD  omeprazole (PRILOSEC) 20 MG capsule Take 1 capsule (20 mg total) by mouth daily. 07/16/19  Marcine Matar, MD  ondansetron (ZOFRAN) 4 MG tablet Take 1 tablet (4 mg total) by mouth every 8 (eight) hours as needed for nausea or vomiting. 10/28/19   Jacalyn Lefevre, MD    Allergies    Ciprofloxacin, Flagyl [metronidazole], and Penicillins  Review of Systems   Review of Systems  Constitutional: Negative for fever.  Respiratory: Negative for cough and shortness of breath.   Cardiovascular: Negative for chest pain.  Gastrointestinal: Negative for constipation, diarrhea, nausea and vomiting.  Musculoskeletal: Positive for joint swelling. Negative for arthralgias and myalgias.  Skin: Positive for rash.  Allergic/Immunologic: Negative for immunocompromised state.  Neurological: Negative for weakness.    Psychiatric/Behavioral: Negative for confusion.    Physical Exam Updated Vital Signs BP (!) 173/89   Pulse (!) 120   Temp 98.3 F (36.8 C)   Resp 15   LMP 10/07/2015   SpO2 100%   Physical Exam Vitals and nursing note reviewed.  Constitutional:      General: She is not in acute distress.    Appearance: She is well-developed. She is not diaphoretic.  HENT:     Head: Normocephalic and atraumatic.     Mouth/Throat:     Mouth: Mucous membranes are moist.     Pharynx: No oropharyngeal exudate or posterior oropharyngeal erythema.  Eyes:     Conjunctiva/sclera: Conjunctivae normal.  Cardiovascular:     Rate and Rhythm: Normal rate and regular rhythm.     Pulses: Normal pulses.     Heart sounds: Normal heart sounds.  Pulmonary:     Effort: Pulmonary effort is normal.     Breath sounds: Normal breath sounds.  Musculoskeletal:        General: Swelling present. No tenderness.     Cervical back: Neck supple.  Lymphadenopathy:     Cervical: No cervical adenopathy.  Skin:    General: Skin is warm and dry.     Findings: Rash present.     Comments: Urticarial rash to lower extremities as well as bilateral forearms.  Also noted to bilateral ears.  Oropharynx is clear. Rash noted to anterior knees and dorsum of feet with mild swelling.   Neurological:     Mental Status: She is alert and oriented to person, place, and time.  Psychiatric:        Behavior: Behavior normal.       ED Results / Procedures / Treatments   Labs (all labs ordered are listed, but only abnormal results are displayed) Labs Reviewed - No data to display  EKG None  Radiology No results found.  Procedures Procedures (including critical care time)  Medications Ordered in ED Medications  cetirizine HCl (Zyrtec) 5 MG/5ML solution 5 mg (has no administration in time range)  famotidine (PEPCID) tablet 20 mg (has no administration in time range)    ED Course  I have reviewed the triage vital signs  and the nursing notes.  Pertinent labs & imaging results that were available during my care of the patient were reviewed by me and considered in my medical decision making (see chart for details).  Clinical Course as of Nov 05 1411  Fri Nov 05, 2019  [id number] 54 year old female with rash onset last night, currently taking cipro and flagyl for enteritis with only 1 dose of medication left.  Advised to stop the antibiotics. Continue with benadryl, will give pepcid and zyrtec in the ER today and recommend continue with same. Return to ER for worsening or concerning symptoms, otherwise,  follow up with PCP to discuss allergy testing due to PCN allergy and now possible cipro vs flagyl allergy.    [LM]    Clinical Course User Index [LM] Alden Hipp   MDM Rules/Calculators/A&P                          Final Clinical Impression(s) / ED Diagnoses Final diagnoses:  Urticaria    Rx / DC Orders ED Discharge Orders    None       Jeannie Fend, PA-C 11/05/19 1413    Sabino Donovan, MD 11/06/19 [phone]

## 2019-11-05 NOTE — ED Triage Notes (Signed)
Pt reports red, pruritic rash to torso and legs that started last night and had increased upon waking this morning, recently took cipro and flagyl for an infection in her abdomen. Pt denies oral swelling or sob. Took some benadryl last night and this am.

## 2019-11-05 NOTE — Discharge Instructions (Addendum)
STOP the antibiotics.  Take Zyrtec and Pepcid.  Take Benadryl as needed as directed. Return to the ER for new or worsening symptoms.

## 2019-11-26 ENCOUNTER — Ambulatory Visit: Payer: Self-pay | Admitting: Family

## 2019-11-26 NOTE — Progress Notes (Deleted)
Patient ID: Ann Jarvis, female    DOB: 12/12/1965  MRN: [id number]  CC: Hospital Follow-Up  Subjective: Ann Jarvis is a 54 y.o. female with history of essential hypertension, gastroesophageal reflux disease without esophagitis, hypothyroidism, Bell's palsy, tobacco abuse, and cocaine abuse who presents for hospital follow-up.  1. ER FOLLOW UP: Last visit 11/05/2019 with Dr. Myrtis Ser. During that encounter patient presented with complaint of burning rash to her extremities onset the previous night. At that time she was currently taking Cipro and Flagyl for enteritis and took her last dose prior to coming to the emergency department. Patient reported her throat was burning as well. Patient reported taking Benadryl. Denied difficulty breathing, swallowing, nausea, and vomiting. Reported only having one dose left of her antibiotics.   Patient was instructed to stop Cipro and Flagyl. Continued on Benadryl, given Pepcid and Zyrtec in the emergency department and recommended to continue with the same. Return to ED precautions discussed and to follow-up with PCP to discuss allergy testing due to penicillin allergy and now possible Cipro vs. Flagyl allergy.   Time since discharge: 21 days Hospital/facility: [facility] Emergency Department Diagnosis: urticaria Procedures/tests: EKG Consultants: none New medications: Zyrtec and Pepcid Discharge instructions: Return to ED precautions discussed and follow-up with PCP Status: {Blank multiple:19196::"better","worse","stable","fluctuating"}    1. ABDOMINAL PAIN: Location:  Onset:  Description:  Modifying factors:    Symptoms Nausea/Vomiting:  Diarrhea:  Constipation:  Melena/BRBPR:  Hematemesis:  Anorexia:  Fever/Chills:  Jaundice:  Dysuria:  Back pain:  Rash:  Weight loss:  Vaginal bleeding:  STD exposure:  LMP:  Alcohol use:  NSAID use:   PMH Past Surgeries:   2. RASH: Duration:  {Blank  single:19197::"chronic","days","weeks","months"}  Location: {Blank multiple:19196::"generalized","trunk","face","hands","arms","legs","groin"}  Itching: {Blank single:19197::"yes","no"} Burning: {Blank single:19197::"yes","no"} Redness: {Blank single:19197::"yes","no"} Oozing: {Blank single:19197::"yes","no"} Scaling: {Blank single:19197::"yes","no"} Blisters: {Blank single:19197::"yes","no"} Painful: {Blank single:19197::"yes","no"} Fevers: {Blank single:19197::"yes","no"} Change in detergents/soaps/personal care products: {Blank single:19197::"yes","no"} Recent illness: {Blank single:19197::"yes","no"} Recent travel:{Blank single:19197::"yes","no"} History of same: {Blank single:19197::"yes","no"} Context: {Blank multiple:19196::"better","worse","stable","fluctuating","contacts with the same"} Alleviating factors: {Blank multiple:19196::"hydrocortisone cream","benadryl","lotion/moisturizer","nothing"} Treatments attempted:{Blank multiple:19196::"hydrocortisone cream","benadryl","OTC anit-fungal","lotion/moisturizer","nothing"} Shortness of breath: {Blank single:19197::"yes","no"}  Throat/tongue swelling: {Blank single:19197::"yes","no"} Myalgias/arthralgias: {Blank single:19197::"yes","no"}  Patient Active Problem List   Diagnosis Date Noted  . Gastroesophageal reflux disease without esophagitis 07/16/2019  . Hot flashes 03/13/2017  . Chronic pain of right knee 12/12/2016  . Chronic pain of left ankle 12/12/2016  . 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 Hypothyroidism 11/13/2015  . Bell's palsy 10/09/2011  . TOBACCO ABUSE 10/10/2008  . COCAINE ABUSE 08/17/2007  . Essential hypertension 08/17/2007     Current Outpatient Medications on File Prior to Visit  Medication Sig Dispense Refill  . celecoxib (CELEBREX) 200 MG capsule Take 1 capsule (200 mg total) by mouth daily. 30 capsule 6  . ciprofloxacin (CIPRO) 500 MG tablet Take 1 tablet (500 mg total) by mouth 2 (two) times  daily. 14 tablet 0  . hydrochlorothiazide (HYDRODIURIL) 25 MG tablet Take 1 tablet (25 mg total) by mouth daily. Must keep appointment in March for more refills. 30 tablet 6  . labetalol (NORMODYNE) 100 MG tablet Take 1 tablet (100 mg total) by mouth 2 (two) times daily. 60 tablet 6  . levothyroxine (SYNTHROID) 137 MCG tablet Take 1 tablet (137 mcg total) by mouth daily before breakfast. 30 tablet 6  . losartan (COZAAR) 100 MG tablet Take 1 tablet (100 mg total) by mouth daily. Must keep appointment in March for more refills. 30 tablet 6  . metroNIDAZOLE (FLAGYL)  500 MG tablet Take 1 tablet (500 mg total) by mouth 2 (two) times daily. 14 tablet 0  . omeprazole (PRILOSEC) 20 MG capsule Take 1 capsule (20 mg total) by mouth daily. 30 capsule 3  . ondansetron (ZOFRAN) 4 MG tablet Take 1 tablet (4 mg total) by mouth every 8 (eight) hours as needed for nausea or vomiting. 4 tablet 0   No current facility-administered medications on file prior to visit.    Allergies  Allergen Reactions  . Ciprofloxacin Rash    Significant rash, taking with Flagyl  . Flagyl [Metronidazole] Rash    Developed significant rash while taking with Cipro  . Penicillins Swelling    Did it involve swelling of the face/tongue/throat, SOB, or low BP? No Did it involve sudden or severe rash/hives, skin peeling, or any reaction on the inside of your mouth or nose? No Did you need to seek medical attention at a hospital or doctor's office? No When did it last happen?Pt states a long time ago If all above answers are "NO", may proceed with cephalosporin use.     Social History   Socioeconomic History  . Marital status: Single    Spouse name: Not on file  . Number of children: Not on file  . Years of education: Not on file  . Highest education level: Not on file  Occupational History  . Not on file  Tobacco Use  . Smoking status: Current Some Day Smoker    Packs/day: 0.25    Types: Cigarettes  . Smokeless  tobacco: Never Used  . Tobacco comment: 1 cig a day  Substance and Sexual Activity  . Alcohol use: Yes    Alcohol/week: 1.0 - 2.0 standard drink    Types: 1 - 2 Cans of beer per week    Comment: occ  . Drug use: Yes    Types: Marijuana    Comment: marijuana  . Sexual activity: Not on file  Other Topics Concern  . Not on file  Social History Narrative   Smokes 1 pack/week x 20 years, smokes marijuna, no cocaine in the last year.  Works for [employer].      Financial assistance approved for 100% discount at [facility] and has [facility] card Rudell Cobb Aug 01 2009 8.58am   Financial assistance approved for 100% discount at [facility] and has [facility] card Gavin Pound February 02, 2010 12.20pm   Social Determinants of Health   Financial Resource Strain:   . Difficulty of Paying Living Expenses: Not on file  Food Insecurity:   . Worried About Programme researcher, broadcasting/film/video in the Last Year: Not on file  . Ran Out of Food in the Last Year: Not on file  Transportation Needs:   . Lack of Transportation (Medical): Not on file  . Lack of Transportation (Non-Medical): Not on file  Physical Activity:   . Days of Exercise per Week: Not on file  . Minutes of Exercise per Session: Not on file  Stress:   . Feeling of Stress : Not on file  Social Connections:   . Frequency of Communication with Friends and Family: Not on file  . Frequency of Social Gatherings with Friends and Family: Not on file  . Attends Religious Services: Not on file  . Active Member of Clubs or Organizations: Not on file  . Attends Banker Meetings: Not on file  . Marital Status: Not on file  Intimate Partner Violence:   . Fear of Current or Ex-Partner:  Not on file  . Emotionally Abused: Not on file  . Physically Abused: Not on file  . Sexually Abused: Not on file    No family history on file.  Past Surgical History:  Procedure Laterality Date  . BTL    . MOUTH SURGERY      ROS: Review of Systems Negative except as  stated above  PHYSICAL EXAM: LMP 10/07/2015   Physical Exam  {female adult master:310786} {female adult master:310785}  CMP Latest Ref Rng & Units 10/28/2019 05/04/2019 06/05/2018  Glucose 70 - 99 mg/dL 294(T) 654(Y) 85  BUN 6 - 20 mg/dL 16 16 9   Creatinine 0.44 - 1.00 mg/dL ) 5.03(T) 4.65(K)  Sodium 135 - 145 mmol/L 138 140 133(L)  Potassium 3.5 - 5.1 mmol/L 3.7 3.7 3.2(L)  Chloride 98 - 111 mmol/L 101 104 98  CO2 22 - 32 mmol/L 26 27 24   Calcium 8.9 - 10.3 mg/dL 9.0 8.9 9.2  Total Protein 6.5 - 8.1 g/dL 8.3(H) 7.7 8.4(H)  Total Bilirubin 0.3 - 1.2 mg/dL 1.1 0.4 0.6  Alkaline Phos 38 - 126 U/L 62 93 72  AST 15 - 41 U/L 32 21 38  ALT 0 - 44 U/L 27 16 38   Lipid Panel     Component Value Date/Time   CHOL 165 03/19/2019 1634   TRIG 60 03/19/2019 1634   HDL 75 03/19/2019 1634   CHOLHDL 2.2 03/19/2019 1634   CHOLHDL 2.3 11/11/2014 1004   VLDL 19 11/11/2014 1004   LDLCALC 78 03/19/2019 1634    CBC    Component Value Date/Time   WBC 12.3 (H) 10/28/2019 1950   RBC 5.78 (H) 10/28/2019 1950   HGB 16.3 (H) 10/28/2019 1950   HGB 13.3 11/11/2017 1431   HCT 51.5 (H) 10/28/2019 1950   HCT 41.9 11/11/2017 1431   PLT 238 10/28/2019 1950   PLT 250 11/11/2017 1431   MCV 89.1 10/28/2019 1950   MCV 88 11/11/2017 1431   MCH 28.2 10/28/2019 1950   MCHC 31.7 10/28/2019 1950   RDW 13.4 10/28/2019 1950   RDW 13.3 11/11/2017 1431   LYMPHSABS 1.4 10/28/2019 1950   MONOABS 0.6 10/28/2019 1950   EOSABS 0.1 10/28/2019 1950   BASOSABS 0.1 10/28/2019 1950    ASSESSMENT AND PLAN:  There are no diagnoses linked to this encounter.   Patient was given the opportunity to ask questions.  Patient verbalized understanding of the plan and was able to repeat key elements of the plan.   No orders of the defined types were placed in this encounter.    Requested Prescriptions    No prescriptions requested or ordered in this encounter    No follow-ups on file.  10/30/2019, NP

## 2019-11-26 NOTE — Progress Notes (Deleted)
  Patient ID: Ann Jarvis, female    DOB: 08/15/1965  MRN: [id number]  CC: Hospital Follow-Up  Subjective: Ann Jarvis is a 53 y.o. female with history of essential hypertension, gastroesophageal reflux disease without esophagitis, hypothyroidism, Bell's palsy, tobacco abuse, and cocaine abuse who presents for hospital follow-up.  1. ER FOLLOW UP: Last visit 11/05/2019 with Dr. Katz. During that encounter patient presented with complaint of burning rash to her extremities onset the previous night. At that time she was currently taking Cipro and Flagyl for enteritis and took her last dose prior to coming to the emergency department. Patient reported her throat was burning as well. Patient reported taking Benadryl. Denied difficulty breathing, swallowing, nausea, and vomiting. Reported only having one dose left of her antibiotics.   Patient was instructed to stop Cipro and Flagyl. Continued on Benadryl, given Pepcid and Zyrtec in the emergency department and recommended to continue with the same. Return to ED precautions discussed and to follow-up with PCP to discuss allergy testing due to penicillin allergy and now possible Cipro vs. Flagyl allergy.   Time since discharge: 21 days Hospital/facility: [facility] Emergency Department Diagnosis: urticaria Procedures/tests: EKG Consultants: none New medications: Zyrtec and Pepcid Discharge instructions: Return to ED precautions discussed and follow-up with PCP Status: {Blank multiple:19196::"better","worse","stable","fluctuating"}    1. ABDOMINAL PAIN: Location:  Onset:  Description:  Modifying factors:    Symptoms Nausea/Vomiting:  Diarrhea:  Constipation:  Melena/BRBPR:  Hematemesis:  Anorexia:  Fever/Chills:  Jaundice:  Dysuria:  Back pain:  Rash:  Weight loss:  Vaginal bleeding:  STD exposure:  LMP:  Alcohol use:  NSAID use:   PMH Past Surgeries:   2. RASH: Duration:  {Blank  single:19197::"chronic","days","weeks","months"}  Location: {Blank multiple:19196::"generalized","trunk","face","hands","arms","legs","groin"}  Itching: {Blank single:19197::"yes","no"} Burning: {Blank single:19197::"yes","no"} Redness: {Blank single:19197::"yes","no"} Oozing: {Blank single:19197::"yes","no"} Scaling: {Blank single:19197::"yes","no"} Blisters: {Blank single:19197::"yes","no"} Painful: {Blank single:19197::"yes","no"} Fevers: {Blank single:19197::"yes","no"} Change in detergents/soaps/personal care products: {Blank single:19197::"yes","no"} Recent illness: {Blank single:19197::"yes","no"} Recent travel:{Blank single:19197::"yes","no"} History of same: {Blank single:19197::"yes","no"} Context: {Blank multiple:19196::"better","worse","stable","fluctuating","contacts with the same"} Alleviating factors: {Blank multiple:19196::"hydrocortisone cream","benadryl","lotion/moisturizer","nothing"} Treatments attempted:{Blank multiple:19196::"hydrocortisone cream","benadryl","OTC anit-fungal","lotion/moisturizer","nothing"} Shortness of breath: {Blank single:19197::"yes","no"}  Throat/tongue swelling: {Blank single:19197::"yes","no"} Myalgias/arthralgias: {Blank single:19197::"yes","no"}  Patient Active Problem List   Diagnosis Date Noted  . Gastroesophageal reflux disease without esophagitis 07/16/2019  . Hot flashes 03/13/2017  . Chronic pain of right knee 12/12/2016  . Chronic pain of left ankle 12/12/2016  . 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 Hypothyroidism 11/13/2015  . Bell's palsy 10/09/2011  . TOBACCO ABUSE 10/10/2008  . COCAINE ABUSE 08/17/2007  . Essential hypertension 08/17/2007     Current Outpatient Medications on File Prior to Visit  Medication Sig Dispense Refill  . celecoxib (CELEBREX) 200 MG capsule Take 1 capsule (200 mg total) by mouth daily. 30 capsule 6  . ciprofloxacin (CIPRO) 500 MG tablet Take 1 tablet (500 mg total) by mouth 2 (two) times  daily. 14 tablet 0  . hydrochlorothiazide (HYDRODIURIL) 25 MG tablet Take 1 tablet (25 mg total) by mouth daily. Must keep appointment in March for more refills. 30 tablet 6  . labetalol (NORMODYNE) 100 MG tablet Take 1 tablet (100 mg total) by mouth 2 (two) times daily. 60 tablet 6  . levothyroxine (SYNTHROID) 137 MCG tablet Take 1 tablet (137 mcg total) by mouth daily before breakfast. 30 tablet 6  . losartan (COZAAR) 100 MG tablet Take 1 tablet (100 mg total) by mouth daily. Must keep appointment in March for more refills. 30 tablet 6  . metroNIDAZOLE (FLAGYL)   500 MG tablet Take 1 tablet (500 mg total) by mouth 2 (two) times daily. 14 tablet 0  . omeprazole (PRILOSEC) 20 MG capsule Take 1 capsule (20 mg total) by mouth daily. 30 capsule 3  . ondansetron (ZOFRAN) 4 MG tablet Take 1 tablet (4 mg total) by mouth every 8 (eight) hours as needed for nausea or vomiting. 4 tablet 0   No current facility-administered medications on file prior to visit.    Allergies  Allergen Reactions  . Ciprofloxacin Rash    Significant rash, taking with Flagyl  . Flagyl [Metronidazole] Rash    Developed significant rash while taking with Cipro  . Penicillins Swelling    Did it involve swelling of the face/tongue/throat, SOB, or low BP? No Did it involve sudden or severe rash/hives, skin peeling, or any reaction on the inside of your mouth or nose? No Did you need to seek medical attention at a hospital or doctor's office? No When did it last happen?Pt states a long time ago If all above answers are "NO", may proceed with cephalosporin use.     Social History   Socioeconomic History  . Marital status: Single    Spouse name: Not on file  . Number of children: Not on file  . Years of education: Not on file  . Highest education level: Not on file  Occupational History  . Not on file  Tobacco Use  . Smoking status: Current Some Day Smoker    Packs/day: 0.25    Types: Cigarettes  . Smokeless  tobacco: Never Used  . Tobacco comment: 1 cig a day  Substance and Sexual Activity  . Alcohol use: Yes    Alcohol/week: 1.0 - 2.0 standard drink    Types: 1 - 2 Cans of beer per week    Comment: occ  . Drug use: Yes    Types: Marijuana    Comment: marijuana  . Sexual activity: Not on file  Other Topics Concern  . Not on file  Social History Narrative   Smokes 1 pack/week x 20 years, smokes marijuna, no cocaine in the last year.  Works for [employer].      Financial assistance approved for 100% discount at [facility] and has [facility] card Deborah Hill Aug 01 2009 8.58am   Financial assistance approved for 100% discount at [facility] and has [facility] card Deborah February 02, 2010 12.20pm   Social Determinants of Health   Financial Resource Strain:   . Difficulty of Paying Living Expenses: Not on file  Food Insecurity:   . Worried About Running Out of Food in the Last Year: Not on file  . Ran Out of Food in the Last Year: Not on file  Transportation Needs:   . Lack of Transportation (Medical): Not on file  . Lack of Transportation (Non-Medical): Not on file  Physical Activity:   . Days of Exercise per Week: Not on file  . Minutes of Exercise per Session: Not on file  Stress:   . Feeling of Stress : Not on file  Social Connections:   . Frequency of Communication with Friends and Family: Not on file  . Frequency of Social Gatherings with Friends and Family: Not on file  . Attends Religious Services: Not on file  . Active Member of Clubs or Organizations: Not on file  . Attends Club or Organization Meetings: Not on file  . Marital Status: Not on file  Intimate Partner Violence:   . Fear of Current or Ex-Partner:   Not on file  . Emotionally Abused: Not on file  . Physically Abused: Not on file  . Sexually Abused: Not on file    No family history on file.  Past Surgical History:  Procedure Laterality Date  . BTL    . MOUTH SURGERY      ROS: Review of Systems Negative except as  stated above  PHYSICAL EXAM: LMP 10/07/2015   Physical Exam  {female adult master:310786} {female adult master:310785}  CMP Latest Ref Rng & Units 10/28/2019 05/04/2019 06/05/2018  Glucose 70 - 99 mg/dL 116(H) 112(H) 85  BUN 6 - 20 mg/dL 16 16 9  Creatinine 0.44 - 1.00 mg/dL 1.15(H) 1.35(H) 1.11(H)  Sodium 135 - 145 mmol/L 138 140 133(L)  Potassium 3.5 - 5.1 mmol/L 3.7 3.7 3.2(L)  Chloride 98 - 111 mmol/L 101 104 98  CO2 22 - 32 mmol/L 26 27 24  Calcium 8.9 - 10.3 mg/dL 9.0 8.9 9.2  Total Protein 6.5 - 8.1 g/dL 8.3(H) 7.7 8.4(H)  Total Bilirubin 0.3 - 1.2 mg/dL 1.1 0.4 0.6  Alkaline Phos 38 - 126 U/L 62 93 72  AST 15 - 41 U/L 32 21 38  ALT 0 - 44 U/L 27 16 38   Lipid Panel     Component Value Date/Time   CHOL 165 03/19/2019 1634   TRIG 60 03/19/2019 1634   HDL 75 03/19/2019 1634   CHOLHDL 2.2 03/19/2019 1634   CHOLHDL 2.3 11/11/2014 1004   VLDL 19 11/11/2014 1004   LDLCALC 78 03/19/2019 1634    CBC    Component Value Date/Time   WBC 12.3 (H) 10/28/2019 1950   RBC 5.78 (H) 10/28/2019 1950   HGB 16.3 (H) 10/28/2019 1950   HGB 13.3 11/11/2017 1431   HCT 51.5 (H) 10/28/2019 1950   HCT 41.9 11/11/2017 1431   PLT 238 10/28/2019 1950   PLT 250 11/11/2017 1431   MCV 89.1 10/28/2019 1950   MCV 88 11/11/2017 1431   MCH 28.2 10/28/2019 1950   MCHC 31.7 10/28/2019 1950   RDW 13.4 10/28/2019 1950   RDW 13.3 11/11/2017 1431   LYMPHSABS 1.4 10/28/2019 1950   MONOABS 0.6 10/28/2019 1950   EOSABS 0.1 10/28/2019 1950   BASOSABS 0.1 10/28/2019 1950    ASSESSMENT AND PLAN:  There are no diagnoses linked to this encounter.   Patient was given the opportunity to ask questions.  Patient verbalized understanding of the plan and was able to repeat key elements of the plan.   No orders of the defined types were placed in this encounter.    Requested Prescriptions    No prescriptions requested or ordered in this encounter    No follow-ups on file.  Corene Resnick J Aland Chestnutt, NP  

## 2019-12-09 ENCOUNTER — Other Ambulatory Visit: Payer: Self-pay | Admitting: Internal Medicine

## 2019-12-09 DIAGNOSIS — E890 Postprocedural hypothyroidism: Secondary | ICD-10-CM

## 2019-12-09 DIAGNOSIS — I10 Essential (primary) hypertension: Secondary | ICD-10-CM

## 2019-12-09 MED ORDER — LEVOTHYROXINE SODIUM 137 MCG PO TABS: 137.0000 ug | ORAL_TABLET | Freq: Every day | ORAL | 0 refills | Status: DC

## 2019-12-09 MED ORDER — HYDROCHLOROTHIAZIDE 25 MG PO TABS: 25.0000 mg | ORAL_TABLET | Freq: Every day | ORAL | 0 refills | Status: DC

## 2019-12-09 MED ORDER — LOSARTAN POTASSIUM 100 MG PO TABS: 100.0000 mg | ORAL_TABLET | Freq: Every day | ORAL | 0 refills | Status: DC

## 2019-12-09 MED FILL — HYDROCHLOROTHIAZIDE 25 MG T: 25 | 30 days supply | Qty: 30 | Fill #0

## 2019-12-09 MED FILL — LEVOTHYROXINE 137 MCG TAB: 137 | 30 days supply | Qty: 30 | Fill #0

## 2019-12-09 MED FILL — LOSARTAN POTASSIUM 100 MG T: 100 | 30 days supply | Qty: 30 | Fill #0

## 2019-12-09 NOTE — Telephone Encounter (Signed)
Medication Refill - Medication: hydrochlorothiazide (HYDRODIURIL) 25 MG tablet,losartan (COZAAR) 100 MG tablet ,levothyroxine (SYNTHROID) 137 MCG tablet     Has the patient contacted their pharmacy? yes (Agent: If no, request that the patient contact the pharmacy for the refill.) (Agent: If yes, when and what did the pharmacy advise?)Contact PCP  Preferred Pharmacy (with phone number or street name):  [facility] [place], [place] - [place] [address] Phone:  [phone]  Fax:  [phone]       Agent: Please be advised that RX refills may take up to 3 business days. We ask that you follow-up with your pharmacy.

## 2019-12-09 NOTE — Telephone Encounter (Signed)
Requested Prescriptions  Pending Prescriptions Disp Refills   hydrochlorothiazide (HYDRODIURIL) 25 MG tablet 30 tablet 0    Sig: Take 1 tablet (25 mg total) by mouth daily. Must keep appointment in March for more refills.     Cardiovascular: Diuretics - Thiazide Failed - 12/09/2019 11:43 AM      Failed - Cr in normal range and within 360 days    Creat  Date Value Ref Range Status  05/07/2016 1.11 (H) 0.50 - 1.05 mg/dL Final    Comment:      For patients > or = 57 years of age: The upper reference limit for Creatinine is approximately 13% higher for people identified as African-American.      Creatinine, Ser  Date Value Ref Range Status  10/28/2019 1.15 (H) 0.44 - 1.00 mg/dL Final   Creatinine,U  Date Value Ref Range Status  07/27/2007   Final   147.1 (NOTE)  Cutoff Values for Urine Drug Screen:        Drug Class           Cutoff (ng/mL)        Amphetamines            1000        Barbiturates             200        Cocaine Metabolites      300        Benzodiazepines          200        Methadone                 300        Opiates                 2000        Phencyclidine             25        Propoxyphene             300        Marijuana Metabolites     50  For medical purposes only.         Failed - Last BP in normal range    BP Readings from Last 1 Encounters:  11/05/19 (!) 171/84         Passed - Ca in normal range and within 360 days    Calcium  Date Value Ref Range Status  10/28/2019 9.0 8.9 - 10.3 mg/dL Final   Calcium, Ion  Date Value Ref Range Status  11/10/2007 1.08 (L)  Final         Passed - K in normal range and within 360 days    Potassium  Date Value Ref Range Status  10/28/2019 3.7 3.5 - 5.1 mmol/L Final         Passed - Na in normal range and within 360 days    Sodium  Date Value Ref Range Status  10/28/2019 138 135 - 145 mmol/L Final  11/11/2017 140 134 - 144 mmol/L Final         Passed - Valid encounter within last 6 months    Recent  Outpatient Visits          4 months ago Gastroesophageal reflux disease without esophagitis   [facility] Marcine Matar, MD   8 months ago Essential hypertension   [facility] Jonah Blue  B, MD   1 year ago Acute lateral meniscus tear of left knee, initial encounter   [facility] [place], Marzella Schlein, [place]   1 year ago Essential hypertension   [facility] Marcine Matar, MD   1 year ago Essential hypertension   [facility] Marcine Matar, MD      Future Appointments            In 2 weeks Rema Fendt, NP [facility]            losartan (COZAAR) 100 MG tablet 30 tablet 0    Sig: Take 1 tablet (100 mg total) by mouth daily. Must keep appointment in March for more refills.     Cardiovascular:  Angiotensin Receptor Blockers Failed - 12/09/2019 11:43 AM      Failed - Cr in normal range and within 180 days    Creat  Date Value Ref Range Status  05/07/2016 1.11 (H) 0.50 - 1.05 mg/dL Final    Comment:      For patients > or = 56 years of age: The upper reference limit for Creatinine is approximately 13% higher for people identified as African-American.      Creatinine, Ser  Date Value Ref Range Status  10/28/2019 1.15 (H) 0.44 - 1.00 mg/dL Final   Creatinine,U  Date Value Ref Range Status  07/27/2007   Final   147.1 (NOTE)  Cutoff Values for Urine Drug Screen:        Drug Class           Cutoff (ng/mL)        Amphetamines            1000        Barbiturates             200        Cocaine Metabolites      300        Benzodiazepines          200        Methadone                 300        Opiates                 2000        Phencyclidine             25        Propoxyphene             300        Marijuana Metabolites     50  For medical purposes only.         Failed - Last BP in  normal range    BP Readings from Last 1 Encounters:  11/05/19 (!) 171/84         Passed - K in normal range and within 180 days    Potassium  Date Value Ref Range Status  10/28/2019 3.7 3.5 - 5.1 mmol/L Final         Passed - Patient is not pregnant      Passed - Valid encounter within last 6 months    Recent Outpatient Visits          4 months ago Gastroesophageal reflux disease without esophagitis   [facility] Marcine Matar, MD  8 months ago Essential hypertension   [facility] Marcine Matar, MD   1 year ago Acute lateral meniscus tear of left knee, initial encounter   [facility] [place], Marzella Schlein, [place]   1 year ago Essential hypertension   [facility] Marcine Matar, MD   1 year ago Essential hypertension   [facility] Marcine Matar, MD      Future Appointments            In 2 weeks Rema Fendt, NP [facility]            levothyroxine (SYNTHROID) 137 MCG tablet 30 tablet 0    Sig: Take 1 tablet (137 mcg total) by mouth daily before breakfast.     Endocrinology:  Hypothyroid Agents Failed - 12/09/2019 11:43 AM      Failed - TSH needs to be rechecked within 3 months after an abnormal result. Refill until TSH is due.      Passed - TSH in normal range and within 360 days    TSH  Date Value Ref Range Status  10/28/2019 4.108 0.350 - 4.500 uIU/mL Final    Comment:    Performed by a 3rd Generation assay with a functional sensitivity of <=0.01 uIU/mL. Performed at [facility] [address], [place], [place] [address]   03/19/2019 1.730 0.450 - 4.500 uIU/mL Final         Passed - Valid encounter within last 12 months    Recent Outpatient Visits          4 months ago Gastroesophageal reflux disease without esophagitis   [facility] Marcine Matar, MD   8 months ago Essential hypertension   [facility] Marcine Matar, MD   1 year ago Acute lateral meniscus tear of left knee, initial encounter   [facility] [place], Marzella Schlein, [place]   1 year ago Essential hypertension   [facility] Marcine Matar, MD   1 year ago Essential hypertension   [facility] Marcine Matar, MD      Future Appointments            In 2 weeks Rema Fendt, NP [facility]           Patient has scheduled appointment. Gave 30 day courtesy refill on all meds. Labs need checked

## 2019-12-23 ENCOUNTER — Ambulatory Visit: Payer: Self-pay | Admitting: Family

## 2019-12-24 ENCOUNTER — Other Ambulatory Visit: Payer: Self-pay | Admitting: Internal Medicine

## 2019-12-24 DIAGNOSIS — I10 Essential (primary) hypertension: Secondary | ICD-10-CM

## 2019-12-24 DIAGNOSIS — E890 Postprocedural hypothyroidism: Secondary | ICD-10-CM

## 2019-12-24 MED ORDER — LABETALOL HCL 100 MG PO TABS: 100.0000 mg | ORAL_TABLET | Freq: Two times a day (BID) | ORAL | 0 refills | Status: DC

## 2019-12-24 MED FILL — LABETALOL HCL 100 MG TABLET: 100 | 30 days supply | Qty: 60 | Fill #0

## 2019-12-24 NOTE — Telephone Encounter (Signed)
Medication Refill - Medication: levothyroxine, labetalol, losartan   Has the patient contacted their pharmacy? Yes.  Pt states that she is completely out of the medications. Please advise.  (Agent: If no, request that the patient contact the pharmacy for the refill.) (Agent: If yes, when and what did the pharmacy advise?) [facility] [place], [place] - [address]  [address] [place] [address]  Phone: [phone] Fax: [phone]  Hours: Not open 24 hours    Preferred Pharmacy (with phone number or street name):   Agent: Please be advised that RX refills may take up to 3 business days. We ask that you follow-up with your pharmacy.

## 2019-12-28 ENCOUNTER — Other Ambulatory Visit: Payer: Self-pay

## 2019-12-28 ENCOUNTER — Encounter: Payer: Self-pay | Admitting: Family

## 2019-12-28 ENCOUNTER — Ambulatory Visit: Payer: Self-pay | Attending: Family | Admitting: Family

## 2019-12-28 VITALS — BP 189/105 | HR 63 | Temp 97.0°F | Resp 16 | Ht 71.0 in | Wt 154.2 lb

## 2019-12-28 DIAGNOSIS — E890 Postprocedural hypothyroidism: Secondary | ICD-10-CM

## 2019-12-28 DIAGNOSIS — Z1211 Encounter for screening for malignant neoplasm of colon: Secondary | ICD-10-CM

## 2019-12-28 DIAGNOSIS — I10 Essential (primary) hypertension: Secondary | ICD-10-CM

## 2019-12-28 DIAGNOSIS — R1084 Generalized abdominal pain: Secondary | ICD-10-CM

## 2019-12-28 DIAGNOSIS — L509 Urticaria, unspecified: Secondary | ICD-10-CM

## 2019-12-28 MED ORDER — HYDROCHLOROTHIAZIDE 25 MG PO TABS: 25.0000 mg | ORAL_TABLET | Freq: Every day | ORAL | 2 refills | Status: DC

## 2019-12-28 MED ORDER — LABETALOL HCL 100 MG PO TABS: 100.0000 mg | ORAL_TABLET | Freq: Two times a day (BID) | ORAL | 2 refills | Status: DC

## 2019-12-28 MED ORDER — LOSARTAN POTASSIUM 100 MG PO TABS: 100.0000 mg | ORAL_TABLET | Freq: Every day | ORAL | 2 refills | Status: DC

## 2019-12-28 MED ORDER — LEVOTHYROXINE SODIUM 137 MCG PO TABS: 137.0000 ug | ORAL_TABLET | Freq: Every day | ORAL | 2 refills | Status: DC

## 2019-12-28 MED FILL — LOSARTAN POTASSIUM 100 MG T: 100 | 30 days supply | Qty: 30 | Fill #0

## 2019-12-28 MED FILL — HYDROCHLOROTHIAZIDE 25 MG T: 25 | 30 days supply | Qty: 30 | Fill #0

## 2019-12-28 MED FILL — LEVOTHYROXINE 137 MCG TAB: 137 | 30 days supply | Qty: 30 | Fill #0

## 2019-12-28 NOTE — Patient Instructions (Addendum)
Continue Losartan, Hydrochlorothiazide, and Labetalol for high blood pressure. Follow-up with clinical pharmacist in 1 month for blood pressure check. Continue Levothyroxine for hypothyroidism. Referral to Allergy for testing. Referral to Gastroenterology for colonoscopy. Follow-up in 3 months or sooner if needed with primary physician. Hypertension, Adult Hypertension is another name for high blood pressure. High blood pressure forces your heart to work harder to pump blood. This can cause problems over time. There are two numbers in a blood pressure reading. There is a top number (systolic) over a bottom number (diastolic). It is best to have a blood pressure that is below 120/80. Healthy choices can help lower your blood pressure, or you may need medicine to help lower it. What are the causes? The cause of this condition is not known. Some conditions may be related to high blood pressure. What increases the risk?  Smoking.  Having type 2 diabetes mellitus, high cholesterol, or both.  Not getting enough exercise or physical activity.  Being overweight.  Having too much fat, sugar, calories, or salt (sodium) in your diet.  Drinking too much alcohol.  Having long-term (chronic) kidney disease.  Having a family history of high blood pressure.  Age. Risk increases with age.  Race. You may be at higher risk if you are African American.  Gender. Men are at higher risk than women before age 62. After age 18, women are at higher risk than men.  Having obstructive sleep apnea.  Stress. What are the signs or symptoms?  High blood pressure may not cause symptoms. Very high blood pressure (hypertensive crisis) may cause: ? Headache. ? Feelings of worry or nervousness (anxiety). ? Shortness of breath. ? Nosebleed. ? A feeling of being sick to your stomach (nausea). ? Throwing up (vomiting). ? Changes in how you see. ? Very bad chest pain. ? Seizures. How is this treated?  This  condition is treated by making healthy lifestyle changes, such as: ? Eating healthy foods. ? Exercising more. ? Drinking less alcohol.  Your health care provider may prescribe medicine if lifestyle changes are not enough to get your blood pressure under control, and if: ? Your top number is above 130. ? Your bottom number is above 80.  Your personal target blood pressure may vary. Follow these instructions at home: Eating and drinking   If told, follow the DASH eating plan. To follow this plan: ? Fill one half of your plate at each meal with fruits and vegetables. ? Fill one fourth of your plate at each meal with whole grains. Whole grains include whole-wheat pasta, brown rice, and whole-grain bread. ? Eat or drink low-fat dairy products, such as skim milk or low-fat yogurt. ? Fill one fourth of your plate at each meal with low-fat (lean) proteins. Low-fat proteins include fish, chicken without skin, eggs, beans, and tofu. ? Avoid fatty meat, cured and processed meat, or chicken with skin. ? Avoid pre-made or processed food.  Eat less than 1,500 mg of salt each day.  Do not drink alcohol if: ? Your doctor tells you not to drink. ? You are pregnant, may be pregnant, or are planning to become pregnant.  If you drink alcohol: ? Limit how much you use to:  0-1 drink a day for women.  0-2 drinks a day for men. ? Be aware of how much alcohol is in your drink. In the [place] one drink equals one 12 oz bottle of beer (355 mL), one 5 oz glass of wine (148 mL), or  one 1 oz glass of hard liquor (44 mL). Lifestyle   Work with your doctor to stay at a healthy weight or to lose weight. Ask your doctor what the best weight is for you.  Get at least 30 minutes of exercise most days of the week. This may include walking, swimming, or biking.  Get at least 30 minutes of exercise that strengthens your muscles (resistance exercise) at least 3 days a week. This may include lifting weights or  doing Pilates.  Do not use any products that contain nicotine or tobacco, such as cigarettes, e-cigarettes, and chewing tobacco. If you need help quitting, ask your doctor.  Check your blood pressure at home as told by your doctor.  Keep all follow-up visits as told by your doctor. This is important. Medicines  Take over-the-counter and prescription medicines only as told by your doctor. Follow directions carefully.  Do not skip doses of blood pressure medicine. The medicine does not work as well if you skip doses. Skipping doses also puts you at risk for problems.  Ask your doctor about side effects or reactions to medicines that you should watch for. Contact a doctor if you:  Think you are having a reaction to the medicine you are taking.  Have headaches that keep coming back (recurring).  Feel dizzy.  Have swelling in your ankles.  Have trouble with your vision. Get help right away if you:  Get a very bad headache.  Start to feel mixed up (confused).  Feel weak or numb.  Feel faint.  Have very bad pain in your: ? Chest. ? Belly (abdomen).  Throw up more than once.  Have trouble breathing. Summary  Hypertension is another name for high blood pressure.  High blood pressure forces your heart to work harder to pump blood.  For most people, a normal blood pressure is less than 120/80.  Making healthy choices can help lower blood pressure. If your blood pressure does not get lower with healthy choices, you may need to take medicine. This information is not intended to replace advice given to you by your health care provider. Make sure you discuss any questions you have with your health care provider. Document Revised: 11/26/2017 Document Reviewed: 11/26/2017 Elsevier Patient Education  2020 [employer].

## 2019-12-28 NOTE — Progress Notes (Signed)
Patient ID: Ann Jarvis, female    DOB: 1965/05/17  MRN: [id number]  CC: Chronic Conditions Follow-Up  Subjective: Ann Jarvis is a 54 y.o. female with history of essential hypertension, GERD, hypothyroidism, and Bell's palsywho presents for abdominal pain and rash.   1. HYPERTENSION FOLLOW-UP: 07/16/2019: Visit with Dr. Laural Benes. During that encounter continued on Hydrochlorothiazide, Labetalol, and Losartan.   12/28/2019: Currently taking: see medication list Have you taken your blood pressure medication today: []  Yes [x]  No  Med Adherence: []  Yes    [x]  No, reports has not taken medication in at least 1 month because she has not been able to get refills Medication side effects: []  Yes    [x]  No Adherence with salt restriction: [x]  Yes    []  No Exercise: Yes [x]  No []  Home Monitoring?: []  Yes    [x]  No Monitoring Frequency: []  Yes    [x]  No Home BP results range: []  Yes    [x]  No Smoking [x]  Yes, sometimes  SOB? [x]  Yes    []  No Chest Pain?: []  Yes    [x]  No Leg swelling?: []  Yes    [x]  No Headaches?: []  Yes    [x]  No Dizziness? []  Yes    [x]  No    2. HYPOTHYROIDISM FOLLOW-UP: 07/16/2019: Visit with Dr. . During that encounter Levothyroxine continued. TSH ordered for future.    12/28/2019: Thyroid control status:stable Satisfied with current treatment? reports has not taken medication for at least 1 month because she been unable to receive refills  Medication side effects: no Medication compliance: poor compliance Etiology of hypothyroidism: postoperative hypothyroidism Recent dose adjustment:no Fatigue: no Cold intolerance: no Heat intolerance: yes Weight gain: no Weight loss: yes Constipation: yes Diarrhea/loose stools: no Palpitations: yes Lower extremity edema: no Anxiety/depressed mood: sometimes and denies thoughts of self-harm and suicidal ideation  3. RASH: 11/05/2019: Visit with Dr. at the [facility] Emergency Department. During that  encounter patient with rash onset last night, currently taking Cipro and Flagyl for enteritis with only 1 dose of medication left. Advised to stop the antibiotics. Continue with Benadryl, will give Pepcid and Zyrtec in the ER today and recommend continue with same. Return to ER for worsening or concerning symptoms, otherwise follow-up with PCP to discuss allergy testing due to PCN allergy and now possible Cipro vs. Flagyl allergy.  12/28/2019: Today reports rash has resolved. Reports Zyrtec helped a lot.  4. ABDOMINAL PAIN: Location: generalized  Onset: chronic, patient also has history of enteritis Description: reports she cannot particularly describe Modifying factors: can not eat much as she normally would and reports has lost a lot weight  Symptoms Nausea/Vomiting: nausea, no vomiting Diarrhea: denies Constipation: yes  Melena/BRBPR: denies Hematemesis: denies Fever/Chills: denies Jaundice: denies Dysuria: denies Back pain: yes  Weight loss: yes Alcohol use: yes  NSAID use: Ibuprofen and doesn't help much  Patient Active Problem List   Diagnosis Date Noted  . Gastroesophageal reflux disease without esophagitis 07/16/2019  . Hot flashes 03/13/2017  . Chronic pain of right knee 12/12/2016  . Chronic pain of left ankle 12/12/2016  . 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 Hypothyroidism 11/13/2015  . Bell's palsy 10/09/2011  . TOBACCO ABUSE 10/10/2008  . COCAINE ABUSE 08/17/2007  . Essential hypertension 08/17/2007     Current Outpatient Medications on File Prior to Visit  Medication Sig Dispense Refill  . celecoxib (CELEBREX) 200 MG capsule Take 1 capsule (200 mg total) by mouth daily. 30 capsule 6  .  ciprofloxacin (CIPRO) 500 MG tablet Take 1 tablet (500 mg total) by mouth 2 (two) times daily. 14 tablet 0  . hydrochlorothiazide (HYDRODIURIL) 25 MG tablet Take 1 tablet (25 mg total) by mouth daily. Must keep appointment in March for more refills. 30 tablet 0  .  labetalol (NORMODYNE) 100 MG tablet Take 1 tablet (100 mg total) by mouth 2 (two) times daily. 60 tablet 0  . levothyroxine (SYNTHROID) 137 MCG tablet Take 1 tablet (137 mcg total) by mouth daily before breakfast. 30 tablet 0  . losartan (COZAAR) 100 MG tablet Take 1 tablet (100 mg total) by mouth daily. Must keep appointment in March for more refills. 30 tablet 0  . metroNIDAZOLE (FLAGYL) 500 MG tablet Take 1 tablet (500 mg total) by mouth 2 (two) times daily. 14 tablet 0  . omeprazole (PRILOSEC) 20 MG capsule Take 1 capsule (20 mg total) by mouth daily. 30 capsule 3  . ondansetron (ZOFRAN) 4 MG tablet Take 1 tablet (4 mg total) by mouth every 8 (eight) hours as needed for nausea or vomiting. 4 tablet 0   No current facility-administered medications on file prior to visit.    Allergies  Allergen Reactions  . Ciprofloxacin Rash    Significant rash, taking with Flagyl  . Flagyl [Metronidazole] Rash    Developed significant rash while taking with Cipro  . Penicillins Swelling    Did it involve swelling of the face/tongue/throat, SOB, or low BP? No Did it involve sudden or severe rash/hives, skin peeling, or any reaction on the inside of your mouth or nose? No Did you need to seek medical attention at a hospital or doctor's office? No When did it last happen?Pt states a long time ago If all above answers are "NO", may proceed with cephalosporin use.     Social History   Socioeconomic History  . Marital status: Single    Spouse name: Not on file  . Number of children: Not on file  . Years of education: Not on file  . Highest education level: Not on file  Occupational History  . Not on file  Tobacco Use  . Smoking status: Current Some Day Smoker    Packs/day: 0.25    Types: Cigarettes  . Smokeless tobacco: Never Used  . Tobacco comment: 1 cig a day  Substance and Sexual Activity  . Alcohol use: Yes    Alcohol/week: 1.0 - 2.0 standard drink    Types: 1 - 2 Cans of beer per  week    Comment: occ  . Drug use: Yes    Types: Marijuana    Comment: marijuana  . Sexual activity: Not on file  Other Topics Concern  . Not on file  Social History Narrative   Smokes 1 pack/week x 20 years, smokes marijuna, no cocaine in the last year.  Works for [employer].      [facility] and [facility] Rudell CobbDeborah Hill Aug 01 2009 8.58am   Financial assistance [facility] and [facility] card Gavin Poundeborah February 02, 2010 12.20pm   Social Determinants of Health   Financial Resource Strain:   . Difficulty of Paying Living Expenses: Not on file  Food Insecurity:   . Worried About Programme researcher, broadcasting/film/videounning Out of Food in the Last Year: Not on file  . Ran Out of Food in the Last Year: Not on file  Transportation Needs:   . Lack of Transportation (Medical): Not on file  .  Lack of Transportation (Non-Medical): Not on file  Physical Activity:   . Days of Exercise per Week: Not on file  . Minutes of Exercise per Session: Not on file  Stress:   . Feeling of Stress : Not on file  Social Connections:   . Frequency of Communication with Friends and Family: Not on file  . Frequency of Social Gatherings with Friends and Family: Not on file  . Attends Religious Services: Not on file  . Active Member of Clubs or Organizations: Not on file  . Attends Banker Meetings: Not on file  . Marital Status: Not on file  Intimate Partner Violence:   . Fear of Current or Ex-Partner: Not on file  . Emotionally Abused: Not on file  . Physically Abused: Not on file  . Sexually Abused: Not on file    No family history on file.  Past Surgical History:  Procedure Laterality Date  . BTL    . MOUTH SURGERY      ROS: Review of Systems Negative except as stated above  PHYSICAL EXAM: LMP 10/07/2015   Vitals with BMI 12/28/2019 11/05/2019 11/05/2019  Height 5\' 11"  - -  Weight 154 lbs 3 oz - -  BMI 21.52 - -  Systolic 189 171   Diastolic 105 84 89  Pulse 63 65 120   Wt Readings from Last 3 Encounters:  12/28/19 154 lb 3.2 oz (69.9 kg)  10/28/19 160 lb (72.6 kg)  06/18/18 190 lb (86.2 kg)   Physical Exam General appearance - alert and in no distress and oriented to person, place, and time Mental status - alert, oriented to person, place, and time Neck - supple, no significant adenopathy Lymphatics - no palpable lymphadenopathy, no hepatosplenomegaly Chest - clear to auscultation, no wheezes, rales or rhonchi, symmetric air entry, no tachypnea, retractions or cyanosis Heart - normal rate, regular rhythm, normal S1, S2, no murmurs, rubs, clicks or gallops Abdomen - soft, nondistended, no masses or organomegaly Neurological - alert, oriented, normal speech, no focal findings or movement disorder noted, neck supple without rigidity, cranial nerves II through XII intact, normal muscle tone, no tremors, strength 5/5  ASSESSMENT AND PLAN: 1. Essential hypertension: - Blood pressure not at goal during today's visit. Patient asymptomatic without chest pressure, chest pain, palpitations, and shortness of breath. Endorses she has not taken any blood pressure medication for at least 1 month. - Resume taking Losartan, Hydrochlorothiazide, and Labetalol as prescribed. - Follow-up with in 4 weeks with clinical pharmacist for blood pressure check. Write down your blood pressure readings each day and bring those results along with your home blood pressure monitor to your appointment. Medications may be adjusted at that time if needed. - BMP last obtained 10/28/2019. - CBC last obtained 10/28/2019. - Follow-up with primary physician in 3 months or sooner if needed. - Amb Referral to Clinical Pharmacist - losartan (COZAAR) 100 MG tablet; Take 1 tablet (100 mg total) by mouth daily. Must keep appointment in March for more refills.  Dispense: 30 tablet; Refill: 2 - hydrochlorothiazide (HYDRODIURIL) 25 MG tablet; Take 1 tablet (25 mg  total) by mouth daily. Must keep appointment in March for more refills.  Dispense: 30 tablet; Refill: 2 - labetalol (NORMODYNE) 100 MG tablet; Take 1 tablet (100 mg total) by mouth 2 (two) times daily.  Dispense: 60 tablet; Refill: 2  2. Postoperative hypothyroidism: - Resume Levothyroxine as prescribed.  - TSH last obtained 10/28/2019. - Follow-up with primary physician in 3  months or sooner if needed. - levothyroxine (SYNTHROID) 137 MCG tablet; Take 1 tablet (137 mcg total) by mouth daily before breakfast.  Dispense: 30 tablet; Refill: 2  3. Urticaria: - Visit at the [facility] Emergency Department on 11/05/2019. During that encounter patient with rash onset last night, currently taking Cipro and Flagyl for enteritis with only 1 dose of medication left. Advised to stop the antibiotics. Continue with Benadryl, will give Pepcid and Zyrtec in the ER today and recommend continue with same. Return to ER for worsening or concerning symptoms, otherwise follow-up with PCP to discuss allergy testing due to PCN allergy and now possible Cipro vs. Flagyl allergy. - Today reports rash has resolved. Reports Zyrtec helped a lot. - Referral to Allergy for further evaluation and management. - Follow-up with primary physician as needed. - Ambulatory referral to Allergy  4. Generalized abdominal pain: - Patient with chronic abdominal pain.  - Today denies chest pain, chest pressure, palpitations, and shortness of breath. - Patient with recent history of enteritis and treated with Cipro and Flagyl. - Referral to Gastroenterology for further evaluation and management. - Follow-up with primary physician as needed. - Ambulatory referral to Gastroenterology  5. Screening for colon cancer - Patient due for colon cancer screening. Referral to Gastroenterology for colonoscopy, evaluation, and management. - Ambulatory referral to Gastroenterology  Patient was given the opportunity to ask questions.  Patient  verbalized understanding of the plan and was able to repeat key elements of the plan. Patient was given clear instructions to go to Emergency Department or return to medical center if symptoms don't improve, worsen, or new problems develop.The patient verbalized understanding.  Rema Fendt, NP

## 2019-12-28 NOTE — Progress Notes (Signed)
Out of BP/thyroid meds x 1 month

## 2020-02-08 ENCOUNTER — Encounter: Payer: Self-pay | Admitting: Gastroenterology

## 2020-02-17 ENCOUNTER — Other Ambulatory Visit: Payer: Self-pay | Admitting: Family

## 2020-02-17 ENCOUNTER — Other Ambulatory Visit: Payer: Self-pay | Admitting: Internal Medicine

## 2020-02-17 DIAGNOSIS — E890 Postprocedural hypothyroidism: Secondary | ICD-10-CM

## 2020-02-17 DIAGNOSIS — I10 Essential (primary) hypertension: Secondary | ICD-10-CM

## 2020-02-17 MED ORDER — LEVOTHYROXINE SODIUM 137 MCG PO TABS: 137.0000 ug | ORAL_TABLET | Freq: Every day | ORAL | 2 refills | Status: DC

## 2020-02-17 MED ORDER — HYDROCHLOROTHIAZIDE 25 MG PO TABS: 25.0000 mg | ORAL_TABLET | Freq: Every day | ORAL | 2 refills | Status: DC

## 2020-02-17 MED ORDER — LOSARTAN POTASSIUM 100 MG PO TABS: 100.0000 mg | ORAL_TABLET | Freq: Every day | ORAL | 2 refills | Status: DC

## 2020-02-17 MED FILL — LEVOTHYROXINE 137 MCG TAB: 137 | 30 days supply | Qty: 30 | Fill #0

## 2020-02-17 MED FILL — LOSARTAN POTASSIUM 100 MG T: 100 | 30 days supply | Qty: 30 | Fill #0

## 2020-02-17 MED FILL — HYDROCHLOROTHIAZIDE 25 MG T: 25 | 30 days supply | Qty: 30 | Fill #0

## 2020-02-17 NOTE — Telephone Encounter (Signed)
Medication Refill - Medication: Levothyroxine, hydrochlorothiazide, losartan   Has the patient contacted their pharmacy? Yes.   (Agent: If no, request that the patient contact the pharmacy for the refill.) (Agent: If yes, when and what did the pharmacy advise?)  Preferred Pharmacy (with phone number or street name):  [facility] - [place], [place] - [address]  [address] [place] [address]  Phone: [phone] Fax: [phone]  Hours: Not open 24 hours     Agent: Please be advised that RX refills may take up to 3 business days. We ask that you follow-up with your pharmacy.

## 2020-02-25 ENCOUNTER — Emergency Department (HOSPITAL_COMMUNITY): Payer: Self-pay

## 2020-02-25 ENCOUNTER — Emergency Department (HOSPITAL_COMMUNITY)
Admission: EM | Admit: 2020-02-25 | Discharge: 2020-02-25 | Disposition: A | Discharge status: Home / Self Care | Payer: Self-pay | Attending: Emergency Medicine | Admitting: Emergency Medicine

## 2020-02-25 ENCOUNTER — Other Ambulatory Visit: Payer: Self-pay

## 2020-02-25 ENCOUNTER — Encounter (HOSPITAL_COMMUNITY): Payer: Self-pay | Admitting: Emergency Medicine

## 2020-02-25 DIAGNOSIS — R001 Bradycardia, unspecified: Secondary | ICD-10-CM | POA: Insufficient documentation

## 2020-02-25 DIAGNOSIS — I10 Essential (primary) hypertension: Secondary | ICD-10-CM | POA: Insufficient documentation

## 2020-02-25 DIAGNOSIS — E039 Hypothyroidism, unspecified: Secondary | ICD-10-CM | POA: Insufficient documentation

## 2020-02-25 DIAGNOSIS — F1210 Cannabis abuse, uncomplicated: Secondary | ICD-10-CM | POA: Insufficient documentation

## 2020-02-25 DIAGNOSIS — K5900 Constipation, unspecified: Secondary | ICD-10-CM | POA: Insufficient documentation

## 2020-02-25 DIAGNOSIS — F17210 Nicotine dependence, cigarettes, uncomplicated: Secondary | ICD-10-CM | POA: Insufficient documentation

## 2020-02-25 DIAGNOSIS — Z79899 Other long term (current) drug therapy: Secondary | ICD-10-CM | POA: Insufficient documentation

## 2020-02-25 DIAGNOSIS — R1084 Generalized abdominal pain: Secondary | ICD-10-CM | POA: Insufficient documentation

## 2020-02-25 LAB — CBC WITH DIFFERENTIAL/PLATELET
Abs Immature Granulocytes: 0.02 10*3/uL (ref 0.00–0.07)
Basophils Absolute: 0 10*3/uL (ref 0.0–0.1)
Basophils Relative: 1 %
Eosinophils Absolute: 0.1 10*3/uL (ref 0.0–0.5)
Eosinophils Relative: 1 %
HCT: 46.9 % — ABNORMAL HIGH (ref 36.0–46.0)
Hemoglobin: 14.6 g/dL (ref 12.0–15.0)
Immature Granulocytes: 0 %
Lymphocytes Relative: 18 %
Lymphs Abs: 1 10*3/uL (ref 0.7–4.0)
MCH: 27.9 pg (ref 26.0–34.0)
MCHC: 31.1 g/dL (ref 30.0–36.0)
MCV: 89.5 fL (ref 80.0–100.0)
Monocytes Absolute: 0.5 10*3/uL (ref 0.1–1.0)
Monocytes Relative: 8 %
Neutro Abs: 4 10*3/uL (ref 1.7–7.7)
Neutrophils Relative %: 72 %
Platelets: 202 10*3/uL (ref 150–400)
RBC: 5.24 MIL/uL — ABNORMAL HIGH (ref 3.87–5.11)
RDW: 12.9 % (ref 11.5–15.5)
WBC: 5.6 10*3/uL (ref 4.0–10.5)
nRBC: 0 % (ref 0.0–0.2)

## 2020-02-25 LAB — URINALYSIS, ROUTINE W REFLEX MICROSCOPIC
Bilirubin Urine: NEGATIVE
Glucose, UA: NEGATIVE mg/dL
Ketones, ur: 5 mg/dL — AB
Leukocytes,Ua: NEGATIVE
Nitrite: NEGATIVE
Protein, ur: NEGATIVE mg/dL
Specific Gravity, Urine: 1.008 (ref 1.005–1.030)
pH: 7 (ref 5.0–8.0)

## 2020-02-25 LAB — COMPREHENSIVE METABOLIC PANEL
ALT: 16 U/L (ref 0–44)
AST: 23 U/L (ref 15–41)
Albumin: 3.8 g/dL (ref 3.5–5.0)
Alkaline Phosphatase: 65 U/L (ref 38–126)
Anion gap: 11 (ref 5–15)
BUN: 12 mg/dL (ref 6–20)
CO2: 25 mmol/L (ref 22–32)
Calcium: 9.2 mg/dL (ref 8.9–10.3)
Chloride: 102 mmol/L (ref 98–111)
Creatinine, Ser: 1 mg/dL (ref 0.44–1.00)
GFR, Estimated: >60 mL/min (ref >60)
Glucose, Bld: 94 mg/dL (ref 70–99)
Potassium: 3.4 mmol/L — ABNORMAL LOW (ref 3.5–5.1)
Sodium: 138 mmol/L (ref 135–145)
Total Bilirubin: 1 mg/dL (ref 0.3–1.2)
Total Protein: 8.4 g/dL — ABNORMAL HIGH (ref 6.5–8.1)

## 2020-02-25 LAB — LIPASE, BLOOD: Lipase: 19 U/L (ref 11–51)

## 2020-02-25 MED ORDER — HYDRALAZINE HCL 20 MG/ML IJ SOLN
10.0000 mg | Freq: Once | INTRAMUSCULAR | Status: AC
  Administered 2020-02-25: 10 mg via INTRAVENOUS
  Filled 2020-02-25: qty 1

## 2020-02-25 MED ORDER — ONDANSETRON HCL 4 MG/2ML IJ SOLN
4.0000 mg | Freq: Once | INTRAMUSCULAR | Status: AC
  Administered 2020-02-25: 4 mg via INTRAVENOUS
  Filled 2020-02-25: qty 2

## 2020-02-25 MED ORDER — IOHEXOL 300 MG/ML  SOLN
100.0000 mL | Freq: Once | INTRAMUSCULAR | Status: AC | PRN
  Administered 2020-02-25: 100 mL via INTRAVENOUS

## 2020-02-25 MED ORDER — SODIUM CHLORIDE 0.9 % IV BOLUS
1000.0000 mL | Freq: Once | INTRAVENOUS | Status: AC
  Administered 2020-02-25: 1000 mL via INTRAVENOUS

## 2020-02-25 MED ORDER — MORPHINE SULFATE (PF) 4 MG/ML IV SOLN
4.0000 mg | Freq: Once | INTRAVENOUS | Status: AC
  Administered 2020-02-25: 4 mg via INTRAVENOUS
  Filled 2020-02-25: qty 1

## 2020-02-25 MED ORDER — DOCUSATE SODIUM 100 MG PO CAPS
100.0000 mg | ORAL_CAPSULE | Freq: Once | ORAL | Status: AC
  Administered 2020-02-25: 100 mg via ORAL
  Filled 2020-02-25: qty 1

## 2020-02-25 MED ORDER — HYDROCHLOROTHIAZIDE 25 MG PO TABS
25.0000 mg | ORAL_TABLET | Freq: Once | ORAL | Status: AC
  Administered 2020-02-25: 25 mg via ORAL
  Filled 2020-02-25: qty 1

## 2020-02-25 MED ORDER — GLYCERIN (LAXATIVE) 2.1 G RE SUPP
1.0000 | Freq: Once | RECTAL | Status: AC
  Administered 2020-02-25: 1 via RECTAL
  Filled 2020-02-25: qty 1

## 2020-02-25 NOTE — Discharge Instructions (Signed)
Your CT scan showed constipation today which is the cause of your symptoms. We have provided you with medication in the ED which will help.  Please also take OTC Miralax 1 capful every 2 hours (up to 4x per day) until you have a good BM.  Take your blood pressure medication as prescribed. Follow up with [facility] for refills. Please also follow up with them regarding your low heart rate although this may be related to one of your blood pressure medications  Return to the ED IMMEDIATELY for any worsening symptoms including inability to have a BM despite the medications and miralax, excessive vomiting, fevers > 100.4, passing out, dizziness/lightheadedness, shortness of breath, or any other new/concerning symptoms

## 2020-02-25 NOTE — ED Triage Notes (Signed)
Pt c/o lower abdominal pain since last night. NAD.

## 2020-02-25 NOTE — ED Notes (Signed)
DC instructions reviewed with pt. Pt verbalized understanding.  PT DC.  

## 2020-02-25 NOTE — ED Provider Notes (Signed)
[facility]   CSN: [id number] Arrival date & time: 02/25/20  1016     History Chief Complaint  Patient presents with  . Abdominal Pain    Ann Jarvis is a 54 y.o. female with PMHx HTN, hyperthyroidism, Grave's disease s/p ablation (2011), GERD who presents to the ED today with complaint of gradual onset, constant, sharp, lower abdominal pain that began yesterday. Pt also complains of nausea and NBNB emesis (x5 episodes). Pt reports history of similar symptoms - states she believes she has "gastro something." She states she has a new patient appointment with GI in January. She also complains of constipation for the past week - states that she sometimes has issues with constipation. She has not taken anything for her symptoms this week. She has had small pelleted stools this week. Previous abdominal surgery includes tubal ligation. Pt is still passing gas. Denies fevers, chills, chest pain, shortness of breath, urinary symptoms, vaginal discharge, pelvic pain, or any other associated symptoms.   The history is provided by the patient and medical records.       Past Medical History:  Diagnosis Date  . Cocaine abuse (HCC)   . Grave's disease    s/p radioiodine ablation 03/05/10, F/B Dr. Chestine Sporelark. On Methimazole  . Hypertension   . Hyperthyroidism   . Scabies   . Thyroid condition   . Tobacco abuse     Patient Active Problem List   Diagnosis Date Noted  . Gastroesophageal reflux disease without esophagitis 07/16/2019  . Hot flashes 03/13/2017  . Chronic pain of right knee 12/12/2016  . Chronic pain of left ankle 12/12/2016  . 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 Hypothyroidism 11/13/2015  . Bell's palsy 10/09/2011  . TOBACCO ABUSE 10/10/2008  . COCAINE ABUSE 08/17/2007  . Essential hypertension 08/17/2007    Past Surgical History:  Procedure Laterality Date  . BTL    . MOUTH SURGERY       OB History   No  obstetric history on file.     History reviewed. No pertinent family history.  Social History   Tobacco Use  . Smoking status: Current Some Day Smoker    Packs/day: 0.25    Types: Cigarettes  . Smokeless tobacco: Never Used  . Tobacco comment: 1 cig a day  Substance Use Topics  . Alcohol use: Yes    Alcohol/week: 1.0 - 2.0 standard drink    Types: 1 - 2 Cans of beer per week    Comment: occ  . Drug use: Yes    Types: Marijuana    Comment: marijuana    Home Medications Prior to Admission medications   Medication Sig Start Date End Date Taking? Authorizing Provider  hydrochlorothiazide (HYDRODIURIL) 25 MG tablet Take 1 tablet (25 mg total) by mouth daily. Must keep appointment in March for more refills. Patient taking differently: Take 25 mg by mouth daily.  02/17/20  Yes Zonia KiefStephens, Amy J, NP  labetalol (NORMODYNE) 100 MG tablet Take 1 tablet (100 mg total) by mouth 2 (two) times daily. 12/28/19  Yes Rema FendtStephens, Amy J, NP  levothyroxine (SYNTHROID) 137 MCG tablet Take 1 tablet (137 mcg total) by mouth daily before breakfast. 02/17/20  Yes Zonia KiefStephens, Amy J, NP  losartan (COZAAR) 100 MG tablet Take 1 tablet (100 mg total) by mouth daily. Must keep appointment in March for more refills. Patient taking differently: Take 100 mg by mouth daily.  02/17/20  Yes Rema FendtStephens, Amy J, NP  celecoxib (  CELEBREX) 200 MG capsule Take 1 capsule (200 mg total) by mouth daily. Patient not taking: Reported on 12/28/2019 07/16/19   Marcine Matar, MD  ciprofloxacin (CIPRO) 500 MG tablet Take 1 tablet (500 mg total) by mouth 2 (two) times daily. Patient not taking: Reported on 12/28/2019 10/28/19   Jacalyn Lefevre, MD  omeprazole (PRILOSEC) 20 MG capsule Take 1 capsule (20 mg total) by mouth daily. Patient not taking: Reported on 12/28/2019 07/16/19   Marcine Matar, MD  ondansetron (ZOFRAN) 4 MG tablet Take 1 tablet (4 mg total) by mouth every 8 (eight) hours as needed for nausea or vomiting. Patient not  taking: Reported on 12/28/2019 10/28/19   Jacalyn Lefevre, MD    Allergies    Ciprofloxacin, Flagyl [metronidazole], and Penicillins  Review of Systems   Review of Systems  Constitutional: Negative for chills and fever.  Respiratory: Negative for cough and shortness of breath.   Cardiovascular: Negative for chest pain.  Gastrointestinal: Positive for abdominal pain, constipation, nausea and vomiting. Negative for diarrhea.  All other systems reviewed and are negative.   Physical Exam Updated Vital Signs BP (!) 211/86 (BP Location: Right Arm)   Pulse (!) 45   Temp 98.3 F (36.8 C)   Resp 20   LMP 10/07/2015   SpO2 100%   Physical Exam Vitals and nursing note reviewed.  Constitutional:      Appearance: She is not ill-appearing or diaphoretic.  HENT:     Head: Normocephalic and atraumatic.  Eyes:     Conjunctiva/sclera: Conjunctivae normal.  Cardiovascular:     Rate and Rhythm: Normal rate and regular rhythm.  Pulmonary:     Effort: Pulmonary effort is normal.     Breath sounds: Normal breath sounds. No wheezing, rhonchi or rales.  Abdominal:     General: Abdomen is flat. Bowel sounds are normal.     Palpations: Abdomen is soft.     Tenderness: There is generalized abdominal tenderness. There is no guarding or rebound.  Musculoskeletal:     Cervical back: Neck supple.  Skin:    General: Skin is warm and dry.  Neurological:     Mental Status: She is alert.     ED Results / Procedures / Treatments   Labs (all labs ordered are listed, but only abnormal results are displayed) Labs Reviewed  COMPREHENSIVE METABOLIC PANEL - Abnormal; Notable for the following components:      Result Value   Potassium 3.4 (*)    Total Protein 8.4 (*)    All other components within normal limits  CBC WITH DIFFERENTIAL/PLATELET - Abnormal; Notable for the following components:   RBC 5.24 (*)    HCT 46.9 (*)    All other components within normal limits  URINALYSIS, ROUTINE W REFLEX  MICROSCOPIC - Abnormal; Notable for the following components:   Color, Urine STRAW (*)    Hgb urine dipstick SMALL (*)    Ketones, ur 5 (*)    Bacteria, UA RARE (*)    All other components within normal limits  LIPASE, BLOOD    EKG EKG Interpretation  Date/Time:  Friday February 25 2020 13:41:21 EST Ventricular Rate:  47 PR Interval:    QRS Duration: 113 QT Interval:  528 QTC Calculation: 467 R Axis:   53 Text Interpretation: Sinus bradycardia Consider right atrial enlargement Anterior infarct, age indeterminate Baseline wander in lead(s) V5 since last tracing no significant change Confirmed by Mancel Bale [phone]) on 02/25/2020 1:52:49 PM Also confirmed by Effie Shy,  Mechele Collin [phone]), editor Elita Quick [phone])  on 02/25/2020 3:17:04 PM   Radiology CT Abdomen Pelvis W Contrast  Result Date: 02/25/2020 CLINICAL DATA:  Right lower quadrant abdominal pain EXAM: CT ABDOMEN AND PELVIS WITH CONTRAST TECHNIQUE: Multidetector CT imaging of the abdomen and pelvis was performed using the standard protocol following bolus administration of intravenous contrast. CONTRAST:  OMNIPAQUE IOHEXOL 300 MG/ML  SOLN COMPARISON:  10/28/2019 FINDINGS: Lower chest: Subtle acinar nodules in the lung bases as can be encountered in the setting of hypersensitivity pneumonitis, respiratory bronchiolitis in smokers, or less likely in atypical infection. Hepatobiliary: Unremarkable Pancreas: Unremarkable Spleen: Unremarkable Adrenals/Urinary Tract: Stable appearance mild scarring in both kidneys. Adrenal glands unremarkable. Stomach/Bowel: Prominent stool throughout the colon favors constipation. Appendix unremarkable. Vascular/Lymphatic: Unremarkable Reproductive: Uterine fibroids noted.  Adnexa unremarkable. Other: No supplemental non-categorized findings. Musculoskeletal: Moderate to severe and asymmetric degenerative left hip arthropathy. Mild levoconvex lumbar scoliosis with rotary component. IMPRESSION:  1. Prominent stool throughout the colon favors constipation. 2. Uterine fibroids. 3. Moderate to severe and asymmetric degenerative left hip arthropathy. Mild levoconvex lumbar scoliosis with rotary component. 4. Subtle acinar nodules in the lung bases as can be encountered in the setting of hypersensitivity pneumonitis, respiratory bronchiolitis in smokers, or less likely in atypical infection. Electronically Signed   By: Gaylyn Rong M.D.   On: 02/25/2020 16:05    Procedures Procedures (including critical care time)  Medications Ordered in ED Medications  docusate sodium (COLACE) capsule 100 mg (has no administration in time range)  Glycerin (Adult) 2.1 g suppository 1 suppository (has no administration in time range)  sodium chloride 0.9 % bolus 1,000 mL (0 mLs Intravenous Stopped 02/25/20 1355)  ondansetron (ZOFRAN) injection 4 mg (4 mg Intravenous Given 02/25/20 1233)  morphine 4 MG/ML injection 4 mg (4 mg Intravenous Given 02/25/20 1233)  hydrochlorothiazide (HYDRODIURIL) tablet 25 mg (25 mg Oral Given 02/25/20 1355)  hydrALAZINE (APRESOLINE) injection 10 mg (10 mg Intravenous Given 02/25/20 1409)  iohexol (OMNIPAQUE) 300 MG/ML solution 100 mL (100 mLs Intravenous Contrast Given 02/25/20 1536)    ED Course  I have reviewed the triage vital signs and the nursing notes.  Pertinent labs & imaging results that were available during my care of the patient were reviewed by me and considered in my medical decision making (see chart for details).    MDM Rules/Calculators/A&P                          54 year old female presents to the ED today with complaints of lower abdominal pain that began last night with associated nausea and nonbloody nonbilious emesis x5.  History of similar episodes, states she has an appoint with gastroenterology in January.  She also reports she has been constipated for the past week, passing small pellet stools.  Has not taken anything at home for her  symptoms.  Still passing gas.  Past surgical history includes tubal ligation.  On arrival to the ED patient is afebrile, nontachycardic nontachypneic.  Heart rate bradycardic at 59.  Blood pressure significantly elevated 176/97 initially and then 211/86.  Patient states she has not taken her blood pressure medication today.  Includes 100 mg labetalol and 25 HCTZ.  Repeat heart rate at 45, I am hesitant to provide her labetalol to her at this time given her bradycardia.  We will plan for EKG at this time as there may be some discrepancy between pulse detected from the pulse oximeter versus from the EKG.  If patient is not truly bradycardic will provide her labetalol.  On exam patient has diffuse abdominal tenderness palpation without rebound or guarding.  Given her age we will plan for CT scan as well as lab work.  Pain meds, fluids, Zofran provided.   CBC without leukocytosis.  Hemoglobin stable at 14.6. CMP with a potassium 3.4.  No other electrolyte abnormalities. Lipase of 19.  EKG with confirmed bradycardia. No other concerning factors. Will provide hydralazine at this time as well as pt's HCTZ.   CT scan with findings consistent with constipation. No concern for fecal impaction at this time as the stool does not go directly to rectum with air present. Suspect this is the cause of pt's symptoms today. On reevaluation pt resting comfortably; reports her symptoms have improved. She has been able to tolerate PO in the ED without difficulty. She was also visualized ambulating to the bathroom without issue without complaints of symptomatic bradycardia. Pt to follow up with her PCP for bradycardia. Will provide colace oral and suppository and have instructed on Miralax usage including every 2 hours up to 4x per day for symptomatic relief. Pt is in agreement with plan and stable for discharge home.   This note was prepared using Dragon voice recognition software and may include unintentional dictation errors  due to the inherent limitations of voice recognition software.  Final Clinical Impression(s) / ED Diagnoses Final diagnoses:  Generalized abdominal pain  Constipation, unspecified constipation type  Primary hypertension  Bradycardia    Rx / DC Orders ED Discharge Orders    None       Discharge Instructions     Your CT scan showed constipation today which is the cause of your symptoms. We have provided you with medication in the ED which will help.  Please also take OTC Miralax 1 capful every 2 hours (up to 4x per day) until you have a good BM.  Take your blood pressure medication as prescribed. Follow up with [facility] for refills. Please also follow up with them regarding your low heart rate although this may be related to one of your blood pressure medications  Return to the ED IMMEDIATELY for any worsening symptoms including inability to have a BM despite the medications and miralax, excessive vomiting, fevers > 100.4, passing out, dizziness/lightheadedness, shortness of breath, or any other new/concerning symptoms       Tanda Rockers, PA-C 02/25/20 1644    Mancel Bale, MD 02/27/20 [phone]

## 2020-03-02 MED FILL — LEVOTHYROXINE 137 MCG TAB: 137 | 30 days supply | Qty: 30 | Fill #0

## 2020-03-02 MED FILL — LOSARTAN POTASSIUM 100 MG T: 100 | 30 days supply | Qty: 30 | Fill #0

## 2020-03-02 MED FILL — HYDROCHLOROTHIAZIDE 25 MG T: 25 | 30 days supply | Qty: 30 | Fill #0

## 2020-03-29 ENCOUNTER — Telehealth: Payer: Self-pay | Admitting: General Practice

## 2020-03-29 NOTE — Telephone Encounter (Signed)
Copied from CRM [id number]. Topic: General - Call Back - No Documentation >> Mar 29, 2020  4:13 PM Randol Kern wrote: Best contact [phone] Pt wants a call back from the office regarding financial assistance for GI referral  Please follow up

## 2020-03-30 NOTE — Telephone Encounter (Signed)
I return Pt call, unable to LVM since is full  

## 2020-04-03 ENCOUNTER — Ambulatory Visit: Payer: Self-pay | Admitting: Gastroenterology

## 2020-04-13 ENCOUNTER — Other Ambulatory Visit: Payer: Self-pay | Admitting: Internal Medicine

## 2020-04-13 DIAGNOSIS — I10 Essential (primary) hypertension: Secondary | ICD-10-CM

## 2020-04-13 DIAGNOSIS — E890 Postprocedural hypothyroidism: Secondary | ICD-10-CM

## 2020-04-13 MED ORDER — LABETALOL HCL 100 MG PO TABS: 100.0000 mg | ORAL_TABLET | Freq: Two times a day (BID) | ORAL | 2 refills | Status: DC

## 2020-04-13 MED ORDER — HYDROCHLOROTHIAZIDE 25 MG PO TABS: 25.0000 mg | ORAL_TABLET | Freq: Every day | ORAL | 2 refills | Status: DC

## 2020-04-13 MED ORDER — LEVOTHYROXINE SODIUM 137 MCG PO TABS: 137.0000 ug | ORAL_TABLET | Freq: Every day | ORAL | 2 refills | Status: DC

## 2020-04-13 MED ORDER — LOSARTAN POTASSIUM 100 MG PO TABS: 100.0000 mg | ORAL_TABLET | Freq: Every day | ORAL | 2 refills | Status: DC

## 2020-04-13 MED FILL — LOSARTAN POTASSIUM 100 MG T: 100 | 30 days supply | Qty: 30 | Fill #0

## 2020-04-13 MED FILL — HYDROCHLOROTHIAZIDE 25 MG T: 25 | 30 days supply | Qty: 30 | Fill #0

## 2020-04-13 MED FILL — LABETALOL HCL 100 MG TABS: 100 | 30 days supply | Qty: 60 | Fill #0

## 2020-04-13 MED FILL — LEVOTHYROXINE 137 MCG TAB: 137 | 30 days supply | Qty: 30 | Fill #0

## 2020-04-13 NOTE — Telephone Encounter (Signed)
Medication Refill - Medication: hydrochlorothiazide (HYDRODIURIL) 25 MG tablet  labetalol (NORMODYNE) 100 MG tablet   losartan (COZAAR) 100 MG tablet  levothyroxine (SYNTHROID) 137 MCG   Has the patient contacted their pharmacy? No. (Agent: If no, request that the patient contact the pharmacy for the refill.) (Agent: If yes, when and what did the pharmacy advise?)  Preferred Pharmacy (with phone number or street name): [facility] - [place], [place] - [address]  [address], [place] [address]  Phone:  [phone] Fax:  [phone]   Agent: Please be advised that RX refills may take up to 3 business days. We ask that you follow-up with your pharmacy.

## 2020-06-01 MED FILL — HYDROCHLOROTHIAZIDE 25 MG T: 25 | 30 days supply | Qty: 30 | Fill #1

## 2020-06-01 MED FILL — LABETALOL HCL 100 MG TABS: 100 | 30 days supply | Qty: 60 | Fill #1

## 2020-06-01 MED FILL — LEVOTHYROXINE 137 MCG TAB: 137 | 30 days supply | Qty: 30 | Fill #1

## 2020-06-01 MED FILL — LOSARTAN POTASSIUM 100 MG T: 100 | 30 days supply | Qty: 30 | Fill #1

## 2020-07-25 ENCOUNTER — Other Ambulatory Visit: Payer: Self-pay

## 2020-07-25 ENCOUNTER — Other Ambulatory Visit: Payer: Self-pay | Admitting: Internal Medicine

## 2020-07-25 DIAGNOSIS — I10 Essential (primary) hypertension: Secondary | ICD-10-CM

## 2020-07-25 DIAGNOSIS — E890 Postprocedural hypothyroidism: Secondary | ICD-10-CM

## 2020-07-25 MED ORDER — LOSARTAN POTASSIUM 100 MG PO TABS
100.0000 mg | ORAL_TABLET | Freq: Every day | ORAL | 0 refills | Status: DC
  Filled 2020-07-25 – 2020-08-03 (×2): qty 30, 30d supply, fill #0

## 2020-07-25 MED ORDER — HYDROCHLOROTHIAZIDE 25 MG PO TABS
25.0000 mg | ORAL_TABLET | Freq: Every day | ORAL | 0 refills | Status: DC
Start: 2020-07-25 — End: 2020-08-21
  Filled 2020-07-25 – 2020-08-03 (×2): qty 30, 30d supply, fill #0

## 2020-07-25 MED ORDER — LEVOTHYROXINE SODIUM 137 MCG PO TABS
137.0000 ug | ORAL_TABLET | Freq: Every day | ORAL | 0 refills | Status: DC
  Filled 2020-07-25: qty 30, 30d supply, fill #0

## 2020-07-25 NOTE — Telephone Encounter (Signed)
Medication Refill - Medication: losartan (COZAAR) 100 MG tablet  hydrochlorothiazide (HYDRODIURIL) 25 MG tablet  levothyroxine (SYNTHROID) 137 MCG tablet  Has the patient contacted their pharmacy? No. (Agent: If no, request that the patient contact the pharmacy for the refill.) (Agent: If yes, when and what did the pharmacy advise?)  Preferred Pharmacy (with phone number or street name):  [facility]  [address] [place] [address]  Phone: [phone] Fax: [phone]     Agent: Please be advised that RX refills may take up to 3 business days. We ask that you follow-up with your pharmacy.

## 2020-07-25 NOTE — Telephone Encounter (Signed)
Appointment scheduled 08/21/20 - Courtesy RF given

## 2020-07-26 ENCOUNTER — Other Ambulatory Visit: Payer: Self-pay

## 2020-08-01 ENCOUNTER — Other Ambulatory Visit: Payer: Self-pay

## 2020-08-03 ENCOUNTER — Other Ambulatory Visit: Payer: Self-pay

## 2020-08-10 ENCOUNTER — Other Ambulatory Visit: Payer: Self-pay

## 2020-08-21 ENCOUNTER — Encounter: Payer: Self-pay | Admitting: Internal Medicine

## 2020-08-21 ENCOUNTER — Other Ambulatory Visit: Payer: Self-pay

## 2020-08-21 ENCOUNTER — Ambulatory Visit: Payer: Self-pay | Attending: Internal Medicine | Admitting: Internal Medicine

## 2020-08-21 VITALS — BP 222/101 | HR 59 | Resp 16 | Wt 142.4 lb

## 2020-08-21 DIAGNOSIS — L720 Epidermal cyst: Secondary | ICD-10-CM

## 2020-08-21 DIAGNOSIS — E890 Postprocedural hypothyroidism: Secondary | ICD-10-CM

## 2020-08-21 DIAGNOSIS — R634 Abnormal weight loss: Secondary | ICD-10-CM

## 2020-08-21 DIAGNOSIS — K219 Gastro-esophageal reflux disease without esophagitis: Secondary | ICD-10-CM

## 2020-08-21 DIAGNOSIS — Z1231 Encounter for screening mammogram for malignant neoplasm of breast: Secondary | ICD-10-CM

## 2020-08-21 DIAGNOSIS — Z1211 Encounter for screening for malignant neoplasm of colon: Secondary | ICD-10-CM

## 2020-08-21 DIAGNOSIS — F17200 Nicotine dependence, unspecified, uncomplicated: Secondary | ICD-10-CM

## 2020-08-21 DIAGNOSIS — M170 Bilateral primary osteoarthritis of knee: Secondary | ICD-10-CM

## 2020-08-21 DIAGNOSIS — I10 Essential (primary) hypertension: Secondary | ICD-10-CM

## 2020-08-21 MED ORDER — LEVOTHYROXINE SODIUM 137 MCG PO TABS
137.0000 ug | ORAL_TABLET | Freq: Every day | ORAL | 0 refills | Status: DC
  Filled 2020-08-21 – 2020-09-28 (×3): qty 30, 30d supply, fill #0

## 2020-08-21 MED ORDER — OMEPRAZOLE 20 MG PO CPDR
20.0000 mg | DELAYED_RELEASE_CAPSULE | Freq: Every day | ORAL | 3 refills | Status: DC
  Filled 2020-08-21: qty 30, 30d supply, fill #0

## 2020-08-21 MED ORDER — LABETALOL HCL 100 MG PO TABS
ORAL_TABLET | Freq: Two times a day (BID) | ORAL | 6 refills | Status: DC
Start: 2020-08-21 — End: 2021-12-05
  Filled 2020-08-21 – 2020-10-03 (×3): qty 60, 30d supply, fill #0

## 2020-08-21 MED ORDER — LOSARTAN POTASSIUM 100 MG PO TABS
100.0000 mg | ORAL_TABLET | Freq: Every day | ORAL | 2 refills | Status: DC
  Filled 2020-08-21 – 2020-09-28 (×4): qty 30, 30d supply, fill #0
  Filled 2020-11-24: qty 30, 30d supply, fill #1
  Filled 2020-12-21 – 2021-01-12 (×3): qty 30, 30d supply, fill #2
  Filled 2021-02-26: qty 30, 30d supply, fill #3

## 2020-08-21 MED ORDER — CELECOXIB 200 MG PO CAPS
200.0000 mg | ORAL_CAPSULE | Freq: Every day | ORAL | 3 refills | Status: DC
  Filled 2020-08-21: qty 30, 30d supply, fill #0

## 2020-08-21 MED ORDER — HYDROCHLOROTHIAZIDE 25 MG PO TABS
ORAL_TABLET | ORAL | 2 refills | Status: DC
  Filled 2020-08-21 – 2020-09-28 (×3): qty 30, 30d supply, fill #0
  Filled 2020-11-24: qty 30, 30d supply, fill #1
  Filled 2020-12-21 – 2021-01-12 (×3): qty 30, 30d supply, fill #2
  Filled 2021-02-26: qty 30, 30d supply, fill #3

## 2020-08-21 NOTE — Progress Notes (Signed)
Patient ID: Ann Jarvis, female    DOB: [birth year]  MRN: [id number]  CC: Hypertension   Subjective: Ann Jarvis is a 55 y.o. female who presents for f/u visit.  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  Painful when they appear.  Knees continue to bother her.  LT swells and pops.  Also gives out at time. Larey Seat the other day because LT leg gave out.  Taking over-the-counter NSAIDs.  Supposed to be on Celebrex but ran out sometime ago.  HTN:  BP elve.  She is supposed to be on hydrochlorothiazide, Cozaar and labetalol.  Reports she has been out of the Cozaar.  She denies any chest pains or shortness of breath.  Seen in the ER last month with nausea, vomiting and diarrhea.  Potassium level was low.  Thyroid:  Taking Levothyroxine 137 mcg daily  Tob dep: reports she is light smoker. 1/2 pk a wk.  Smokes only when she drinks wine coolers on wkend Reports she stop cocaine.  Smokes weed off and on  Noted wgh loss since July 2021 Had to go to ER several times with N/V/D.  Issue flairs with spicy and greasy foods. Takes Mylanta every day depending on what she eats.  Taking several over-the-counter NSAIDs. Takes Miralax for constipation Had CT of abdomen and pelvis in November of last year that showed widespread constipation with prominent stool throughout the colon Patient Active Problem List   Diagnosis Date Noted  . Gastroesophageal reflux disease without esophagitis 07/16/2019  . Hot flashes 03/13/2017  . Chronic pain of right knee 12/12/2016  . Chronic pain of left ankle 12/12/2016  . 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 Hypothyroidism 11/13/2015  . Bell's palsy 10/09/2011  . TOBACCO ABUSE 10/10/2008  . COCAINE ABUSE 08/17/2007  . Essential hypertension  08/17/2007     Current Outpatient Medications on File Prior to Visit  Medication Sig Dispense Refill  . celecoxib (CELEBREX) 200 MG capsule Take 1 capsule (200 mg total) by mouth daily. (Patient not taking: Reported on 12/28/2019) 30 capsule 6  . hydrochlorothiazide (HYDRODIURIL) 25 MG tablet TAKE 1 TABLET (25 MG TOTAL) BY MOUTH DAILY. MUST KEEP APPOINTMENT IN [facility] FOR MORE REFILLS. 30 tablet 2  . labetalol (NORMODYNE) 100 MG tablet TAKE 1 TABLET (100 MG TOTAL) BY MOUTH 2 (TWO) TIMES DAILY. 60 tablet 2  . levothyroxine (SYNTHROID) 137 MCG tablet Take 1 tablet (137 mcg total) by mouth daily before breakfast. 30 tablet 0  . losartan (COZAAR) 100 MG tablet TAKE 1 TABLET (100 MG TOTAL) BY MOUTH DAILY. MUST KEEP APPOINTMENT IN [facility] FOR MORE REFILLS. 30 tablet 2  . omeprazole (PRILOSEC) 20 MG capsule Take 1 capsule (20 mg total) by mouth daily. (Patient not taking: Reported on 12/28/2019) 30 capsule 3   No current facility-administered medications on file prior to visit.    Allergies  Allergen Reactions  . Ciprofloxacin Rash    Significant rash, taking with Flagyl  . Flagyl [Metronidazole] Rash    Developed significant rash while taking with Cipro  . Penicillins Swelling    Did it involve swelling of the face/tongue/throat, SOB, or low BP? No Did it involve sudden or severe rash/hives, skin peeling, or any reaction on the inside of your mouth or nose? No Did you need  to seek medical attention at a hospital or doctor's office? No When did it last happen?Pt states a long time ago If all above answers are "NO", may proceed with cephalosporin use.     Social History   Socioeconomic History  . Marital status: Single    Spouse name: Not on file  . Number of children: Not on file  . Years of education: Not on file  . Highest education level: Not on file  Occupational History  . Not on file  Tobacco Use  . Smoking status: Current Some Day Smoker    Packs/day: 0.25    Types:  Cigarettes  . Smokeless tobacco: Never Used  . Tobacco comment: 1 cig a day  Substance and Sexual Activity  . Alcohol use: Yes    Alcohol/week: 1.0 - 2.0 standard drink    Types: 1 - 2 Cans of beer per week    Comment: occ  . Drug use: Yes    Types: Marijuana    Comment: marijuana  . Sexual activity: Not on file  Other Topics Concern  . Not on file  Social History Narrative   Smokes 1 pack/week x 20 years, smokes marijuna, no cocaine in the last year.  Works for [employer].      Financial assistance approved for 100% discount at [facility] and has [facility] card Rudell Cobb Aug 01 2009 8.58am   Financial assistance approved for 100% discount at [facility] and has [facility] card Gavin Pound February 02, 2010 12.20pm   Social Determinants of Health   Financial Resource Strain: Not on file  Food Insecurity: Not on file  Transportation Needs: Not on file  Physical Activity: Not on file  Stress: Not on file  Social Connections: Not on file  Intimate Partner Violence: Not on file    No family history on file.  Past Surgical History:  Procedure Laterality Date  . BTL    . MOUTH SURGERY      ROS: Review of Systems Negative except as stated above  PHYSICAL EXAM: BP (!) 222/101 (BP Location: Right Arm, Patient Position: Sitting, Cuff Size: Normal)   Pulse (!) 59   Resp 16   Wt 142 lb 6.4 oz (64.6 kg)   LMP 10/07/2015   SpO2 100%   BMI 19.86 kg/m   Wt Readings from Last 3 Encounters:  08/21/20 142 lb 6.4 oz (64.6 kg)  12/28/19 154 lb 3.2 oz (69.9 kg)  10/28/19 160 lb (72.6 kg)    Physical Exam  General appearance - alert, well appearing, middle-age African-American female and in no distress.  She has difficulty hearing and a half to speak in a loud voice. Mental status - normal mood, behavior, speech, dress, motor activity, and thought processes Neck - supple, no significant adenopathy Chest - clear to auscultation, no wheezes, rales or rhonchi, symmetric air entry Heart - normal  rate, regular rhythm, normal S1, S2, no murmurs, rubs, clicks or gallops Abdomen: Normal bowel sounds, soft, nontender, no palpable masses or organomegaly Musculoskeletal -knees: Enlargement of both joints.  She has positive crepitus and discomfort with passive range of motion bilaterally.  Right foot: She has about 2 x 2 cm soft movable cyst on the dorsal surface of the midfoot laterally.  No nodules palpated on the dorsal surface of the left foot. Extremities -no lower extremity edema.  CMP Latest Ref Rng & Units 02/25/2020 10/28/2019 05/04/2019  Glucose 70 - 99 mg/dL 94 497(W) 263(Z)  BUN 6 - 20 mg/dL 12 16  16  Creatinine 0.44 - 1.00 mg/dL 9.601.00 4.54(U1.15(H) 9.81(X1.35(H)  Sodium 135 - 145 mmol/L 138 138 140  Potassium 3.5 - 5.1 mmol/L 3.4(L) 3.7 3.7  Chloride 98 - 111 mmol/L 102 101 104  CO2 22 - 32 mmol/L 25 26 27   Calcium 8.9 - 10.3 mg/dL 9.2 9.0 8.9  Total Protein 6.5 - 8.1 g/dL 9.1(Y8.4(H) 8.3(H) 7.7  Total Bilirubin 0.3 - 1.2 mg/dL 1.0 1.1 0.4  Alkaline Phos 38 - 126 U/L 65 62 93  AST 15 - 41 U/L 23 32 21  ALT 0 - 44 U/L 16 27 16    Lipid Panel     Component Value Date/Time   CHOL 165 03/19/2019 1634   TRIG 60 03/19/2019 1634   HDL 75 03/19/2019 1634   CHOLHDL 2.2 03/19/2019 1634   CHOLHDL 2.3 11/11/2014 1004   VLDL 19 11/11/2014 1004   LDLCALC 78 03/19/2019 1634    CBC    Component Value Date/Time   WBC 5.6 02/25/2020 1226   RBC 5.24 (H) 02/25/2020 1226   HGB 14.6 02/25/2020 1226   HGB 13.3 11/11/2017 1431   HCT 46.9 (H) 02/25/2020 1226   HCT 41.9 11/11/2017 1431   PLT 202 02/25/2020 1226   PLT 250 11/11/2017 1431   MCV 89.5 02/25/2020 1226   MCV 88 11/11/2017 1431   MCH 27.9 02/25/2020 1226   MCHC 31.1 02/25/2020 1226   RDW 12.9 02/25/2020 1226   RDW 13.3 11/11/2017 1431   LYMPHSABS 1.0 02/25/2020 1226   MONOABS 0.5 02/25/2020 1226   EOSABS 0.1 02/25/2020 1226   BASOSABS 0.0 02/25/2020 1226    ASSESSMENT AND PLAN: 1. Essential hypertension Not at goal. Refills given on  all 3 medications.  Encourage her to take the medicines every day as prescribed.  Follow-up with clinical pharmacist in 1 week for repeat blood pressure check. - Comprehensive metabolic panel; Future - labetalol (NORMODYNE) 100 MG tablet; TAKE 1 TABLET (100 MG TOTAL) BY MOUTH 2 (TWO) TIMES DAILY.  Dispense: 60 tablet; Refill: 6 - losartan (COZAAR) 100 MG tablet; Take 1 tablet (100 mg total) by mouth daily.  Dispense: 90 tablet; Refill: 2 - hydrochlorothiazide (HYDRODIURIL) 25 MG tablet; TAKE 1 TABLET (25 MG TOTAL) BY MOUTH DAILY. [facility].  Dispense: 90 tablet; Refill: 2  2. Primary osteoarthritis of both knees Stop ibuprofen and other over-the-counter NSAIDs.  I have placed her back on Celebrex. - celecoxib (CELEBREX) 200 MG capsule; Take 1 capsule (200 mg total) by mouth daily.  Dispense: 90 capsule; Refill: 3  3. Postoperative hypothyroidism - levothyroxine (SYNTHROID) 137 MCG tablet; Take 1 tablet (137 mcg total) by mouth daily before breakfast.  Dispense: 30 tablet; Refill: 0 - TSH; Future  4. Epidermal cyst Observe for now.  Once she has been approved for the orange card we can refer her to the podiatrist to see if this can be taken care of since it is bothersome to her.  5. Weight loss, unintentional Discussed the importance of getting her up-to-date with age-appropriate cancer screenings including colonoscopy and mammogram.  Given mammogram scholarship today.  Encouraged her to apply for the orange card/cone discount card so that we can refer for colonoscopy in the near future.  Recent CBC done last month through [facility] was normal. - HIV Antibody (routine testing w rflx); Future  6. Gastroesophageal reflux disease without esophagitis GERD precautions discussed and encouraged.  Encouraged her to stop the over-the-counter NSAIDs.  Went over foods to avoid. -  omeprazole (PRILOSEC) 20 MG capsule; Take 1 capsule (20 mg total) by mouth daily.  Dispense:  60 capsule; Refill: 3  7. Encounter for screening mammogram for malignant neoplasm of breast - MM Digital Screening; Future  8. Screening for colon cancer Will refer for colonoscopy once she has been approved for the orange card/cone discount card.  9. Tobacco dependence Advised to quit.  Discussed health risks associated with smoking.  Patient not ready to give a trial of quitting as yet.   Patient was given the opportunity to ask questions.  Patient verbalized understanding of the plan and was able to repeat key elements of the plan.   No orders of the defined types were placed in this encounter.    Requested Prescriptions    No prescriptions requested or ordered in this encounter    No follow-ups on file.  Jonah Blue, MD, FACP

## 2020-08-21 NOTE — Patient Instructions (Signed)
Please apply for the orange card/cone discount so that we can refer you to the gastroenterologist and the podiatrist.  Your blood pressure is uncontrolled.  You should be on 3 blood pressure medications to include Cozaar, hydrochlorothiazide and labetalol.  Refills sent on all 3 medications to the pharmacy.  Try to limit the salt in the foods.  Stop the ibuprofen.  I have refilled the Celebrex for you to use for the knee pain.

## 2020-08-29 ENCOUNTER — Other Ambulatory Visit: Payer: Self-pay

## 2020-08-30 ENCOUNTER — Other Ambulatory Visit: Payer: Self-pay

## 2020-08-30 ENCOUNTER — Ambulatory Visit: Payer: Self-pay

## 2020-08-30 ENCOUNTER — Ambulatory Visit: Payer: Self-pay | Attending: Internal Medicine

## 2020-08-30 NOTE — Addendum Note (Signed)
Addended byMemory Dance on: 08/30/2020 01:38 PM   Modules accepted: Orders

## 2020-08-31 ENCOUNTER — Telehealth: Payer: Self-pay

## 2020-08-31 LAB — COMPREHENSIVE METABOLIC PANEL
ALT: 10 IU/L (ref 0–32)
AST: 15 IU/L (ref 0–40)
Albumin/Globulin Ratio: 0.8 — ABNORMAL LOW (ref 1.2–2.2)
Albumin: 3.4 g/dL — ABNORMAL LOW (ref 3.8–4.9)
Alkaline Phosphatase: 68 IU/L (ref 44–121)
BUN/Creatinine Ratio: 18 (ref 9–23)
BUN: 18 mg/dL (ref 6–24)
Bilirubin Total: 0.4 mg/dL (ref 0.0–1.2)
CO2: 24 mmol/L (ref 20–29)
Calcium: 9.4 mg/dL (ref 8.7–10.2)
Chloride: 102 mmol/L (ref 96–106)
Creatinine, Ser: 1 mg/dL (ref 0.57–1.00)
Globulin, Total: 4.2 g/dL (ref 1.5–4.5)
Glucose: 61 mg/dL — ABNORMAL LOW (ref 65–99)
Potassium: 3.8 mmol/L (ref 3.5–5.2)
Sodium: 140 mmol/L (ref 134–144)
Total Protein: 7.6 g/dL (ref 6.0–8.5)
eGFR: 66 mL/min/{1.73_m2} (ref >59)

## 2020-08-31 LAB — HIV ANTIBODY (ROUTINE TESTING W REFLEX): HIV Screen 4th Generation wRfx: NONREACTIVE

## 2020-08-31 LAB — TSH: TSH: 2.03 u[IU]/mL (ref 0.450–4.500)

## 2020-08-31 NOTE — Telephone Encounter (Signed)
Contacted pt to go over lab results pt is aware and doesn't;t have any questions or concerns

## 2020-09-04 ENCOUNTER — Ambulatory Visit: Payer: Self-pay

## 2020-09-14 ENCOUNTER — Other Ambulatory Visit: Payer: Self-pay

## 2020-09-21 ENCOUNTER — Other Ambulatory Visit: Payer: Self-pay

## 2020-09-28 ENCOUNTER — Other Ambulatory Visit: Payer: Self-pay

## 2020-09-29 ENCOUNTER — Other Ambulatory Visit: Payer: Self-pay

## 2020-10-03 ENCOUNTER — Other Ambulatory Visit: Payer: Self-pay

## 2020-10-10 ENCOUNTER — Other Ambulatory Visit: Payer: Self-pay

## 2020-10-23 ENCOUNTER — Ambulatory Visit: Payer: Self-pay | Attending: Internal Medicine | Admitting: Internal Medicine

## 2020-10-23 ENCOUNTER — Other Ambulatory Visit: Payer: Self-pay

## 2020-10-23 ENCOUNTER — Encounter: Payer: Self-pay | Admitting: Internal Medicine

## 2020-10-23 DIAGNOSIS — I10 Essential (primary) hypertension: Secondary | ICD-10-CM

## 2020-10-23 DIAGNOSIS — R0981 Nasal congestion: Secondary | ICD-10-CM

## 2020-10-23 DIAGNOSIS — M170 Bilateral primary osteoarthritis of knee: Secondary | ICD-10-CM

## 2020-10-23 MED ORDER — FLUTICASONE PROPIONATE 50 MCG/ACT NA SUSP
1.0000 | Freq: Every day | NASAL | 0 refills | Status: DC | PRN
  Filled 2020-10-23: qty 16, 30d supply, fill #0

## 2020-10-23 MED ORDER — CELECOXIB 200 MG PO CAPS
200.0000 mg | ORAL_CAPSULE | Freq: Every day | ORAL | 3 refills | Status: DC
  Filled 2020-10-23: qty 30, 30d supply, fill #0

## 2020-10-23 NOTE — Progress Notes (Signed)
Patient ID: Ann Jarvis, female   DOB: January 19, 1966, 55 y.o.   MRN: [id number] Virtual Visit via Telephone Note  I connected with Jaana Lissa Merlin on 10/23/2020 at 1:36 p.m by telephone and verified that I am speaking with the correct person using two identifiers  Location: Patient: home Provider: office  Participants: Myself Patient CMA: Ms. Maryclare Bean interpreter:   I discussed the limitations, risks, security and privacy concerns of performing an evaluation and management service by telephone and the availability of in person appointments. I also discussed with the patient that there may be a patient responsible charge related to this service. The patient expressed understanding and agreed to proceed.   History of Present Illness: Hx HTN, Tob,  Hypothyroid post treatment for Graves' disease, cocaine abuse, CKD stage 2-3, GERD, hearing impaired.  Last seen 07/2020.  C/o pain in knees LT >RT knee recently.  Slight puffiness.  She is supposed to be on Celebrex for arthritis in the knees.  She states that she has all of her other medications except the Celebrex.  She is agreeable to picking it up today.  HTN: reports compliance with Labetalol, Losartan and HCTZ. Limits salt in foods No CP/SOB Has advised to check blood pressure but not sure how to use it.  Thinks she has sinus infection C/o of sneezing, rhinorrhea and slight pressure between the eyes and several days.  No fever or discolored mucus.. Taking an OTC claritan  HM:  pt states she was called regarding the [employer] scholarship.  Sent a form and she sent it back.  She awaits a call from them again for appointment.  She tells me that she has completed her form for the orange card/cone discount card and has to schedule an appointment with our financial counselor. Outpatient Encounter Medications as of 10/23/2020  Medication Sig   celecoxib (CELEBREX) 200 MG capsule Take 1 capsule (200 mg total) by mouth daily.    hydrochlorothiazide (HYDRODIURIL) 25 MG tablet TAKE 1 TABLET (25 MG TOTAL) BY MOUTH DAILY. MUST KEEP APPOINTMENT IN [facility] FOR MORE REFILLS.   labetalol (NORMODYNE) 100 MG tablet TAKE 1 TABLET (100 MG TOTAL) BY MOUTH 2 (TWO) TIMES DAILY.   levothyroxine (SYNTHROID) 137 MCG tablet Take 1 tablet (137 mcg total) by mouth daily before breakfast.   losartan (COZAAR) 100 MG tablet Take 1 tablet (100 mg total) by mouth daily.   omeprazole (PRILOSEC) 20 MG capsule Take 1 capsule (20 mg total) by mouth daily.   No facility-administered encounter medications on file as of 10/23/2020.      Observations/Objective: Results for orders placed or performed in visit on 08/21/20  TSH  Result Value Ref Range   TSH 2.030 0.450 - 4.500 uIU/mL  HIV Antibody (routine testing w rflx)  Result Value Ref Range   HIV Screen 4th Generation wRfx Non Reactive Non Reactive  Comprehensive metabolic panel  Result Value Ref Range   Glucose 61 (L) 65 - 99 mg/dL   BUN 18 6 - 24 mg/dL   Creatinine, Ser 1.00 0.57 - 1.00 mg/dL   eGFR 66 >59 mL/min/1.73   BUN/Creatinine Ratio 18 9 - 23   Sodium 140 134 - 144 mmol/L   Potassium 3.8 3.5 - 5.2 mmol/L   Chloride 102 96 - 106 mmol/L   CO2 24 20 - 29 mmol/L   Calcium 9.4 8.7 - 10.2 mg/dL   Total Protein 7.6 6.0 - 8.5 g/dL   Albumin 3.4 (L) 3.8 - 4.9 g/dL   Globulin, Total 4.2  1.5 - 4.5 g/dL   Albumin/Globulin Ratio 0.8 (L) 1.2 - 2.2   Bilirubin Total 0.4 0.0 - 1.2 mg/dL   Alkaline Phosphatase 68 44 - 121 IU/L   AST 15 0 - 40 IU/L   ALT 10 0 - 32 IU/L     Assessment and Plan: 1. Essential hypertension Continue current medications and low-salt diet. I will have them schedule an appointment for her to see the clinical pharmacist for blood pressure check in 3 weeks advised to bring her home blood pressure monitoring device with her so that he can help her figure out how to use it.  2. Sinus congestion Continue Claritin.  Add Flonase. - fluticasone (FLONASE) 50 MCG/ACT  nasal spray; Place 1 spray into both nostrils daily as needed for allergies or rhinitis.  Dispense: 16 g; Refill: 0  3. Primary osteoarthritis of both knees - celecoxib (CELEBREX) 200 MG capsule; Take 1 capsule (200 mg total) by mouth daily.  Dispense: 90 capsule; Refill: 3  When she comes to the pharmacy, patient told to stop at the front desk to schedule the appointment with our financial counselor and bring her completed forms and supporting documents with her for the orange card/cone discount card.  Follow Up Instructions: 3 mths   I discussed the assessment and treatment plan with the patient. The patient was provided an opportunity to ask questions and all were answered. The patient agreed with the plan and demonstrated an understanding of the instructions.   The patient was advised to call back or seek an in-person evaluation if the symptoms worsen or if the condition fails to improve as anticipated.  I  Spent 8 minutes on this telephone encounter  Karle Plumber, MD

## 2020-10-30 ENCOUNTER — Other Ambulatory Visit: Payer: Self-pay

## 2020-11-16 ENCOUNTER — Ambulatory Visit: Payer: Self-pay | Admitting: Pharmacist

## 2020-11-24 ENCOUNTER — Other Ambulatory Visit: Payer: Self-pay | Admitting: Internal Medicine

## 2020-11-24 ENCOUNTER — Other Ambulatory Visit: Payer: Self-pay

## 2020-11-24 DIAGNOSIS — I10 Essential (primary) hypertension: Secondary | ICD-10-CM

## 2020-11-24 DIAGNOSIS — K219 Gastro-esophageal reflux disease without esophagitis: Secondary | ICD-10-CM

## 2020-11-24 DIAGNOSIS — E890 Postprocedural hypothyroidism: Secondary | ICD-10-CM

## 2020-11-24 MED ORDER — LEVOTHYROXINE SODIUM 137 MCG PO TABS
137.0000 ug | ORAL_TABLET | Freq: Every day | ORAL | 5 refills | Status: DC
Start: 2020-11-24 — End: 2021-02-26
  Filled 2020-11-24: qty 30, 30d supply, fill #0
  Filled 2020-12-21 – 2021-01-12 (×3): qty 30, 30d supply, fill #1
  Filled 2021-02-26: qty 30, 30d supply, fill #2

## 2020-11-24 NOTE — Telephone Encounter (Signed)
All prescription except levothyroxine have RF and are current. Call to patient- advised her to let pharmacy know she needs RF- Thyroid medication filled.

## 2020-11-24 NOTE — Telephone Encounter (Signed)
Medication Refill - Medication:  labetalol (NORMODYNE) 100 MG tablet  omeprazole (PRILOSEC) 20 MG capsule  levothyroxine (SYNTHROID) 137 MCG tablet  losartan (COZAAR) 100 MG tablet  hydrochlorothiazide (HYDRODIURIL) 25 MG tablet  Has the patient contacted their pharmacy? No.  Preferred Pharmacy (with phone number or street name):  [facility]  Phone:  [phone] Fax:  [phone]  Agent: Please be advised that RX refills may take up to 3 business days. We ask that you follow-up with your pharmacy.

## 2020-12-06 ENCOUNTER — Ambulatory Visit: Payer: Self-pay | Admitting: Physician Assistant

## 2020-12-14 ENCOUNTER — Ambulatory Visit: Payer: Self-pay | Admitting: Pharmacist

## 2020-12-21 ENCOUNTER — Other Ambulatory Visit: Payer: Self-pay

## 2020-12-28 ENCOUNTER — Other Ambulatory Visit: Payer: Self-pay

## 2021-01-04 ENCOUNTER — Other Ambulatory Visit: Payer: Self-pay

## 2021-01-11 ENCOUNTER — Other Ambulatory Visit: Payer: Self-pay

## 2021-01-12 ENCOUNTER — Other Ambulatory Visit: Payer: Self-pay

## 2021-01-26 ENCOUNTER — Ambulatory Visit: Payer: Self-pay | Admitting: Internal Medicine

## 2021-02-26 ENCOUNTER — Other Ambulatory Visit: Payer: Self-pay | Admitting: Internal Medicine

## 2021-02-26 ENCOUNTER — Other Ambulatory Visit: Payer: Self-pay

## 2021-02-26 DIAGNOSIS — E890 Postprocedural hypothyroidism: Secondary | ICD-10-CM

## 2021-02-26 DIAGNOSIS — I10 Essential (primary) hypertension: Secondary | ICD-10-CM

## 2021-02-26 NOTE — Telephone Encounter (Signed)
Pt is requesting refills losartan (COZAAR) 100 MG tablet 2)hydrochlorothiazide (HYDRODIURIL) 25 MG tablet 3)levothyroxine (SYNTHROID) 137 MCG tablet [facility]  [address], [place] [address]  Phone:  [phone]  Fax:  [phone]  DEA #:  [id number]

## 2021-02-27 ENCOUNTER — Other Ambulatory Visit: Payer: Self-pay

## 2021-02-27 MED ORDER — HYDROCHLOROTHIAZIDE 25 MG PO TABS
ORAL_TABLET | ORAL | 0 refills | Status: DC
  Filled 2021-02-27: qty 90, fill #0
  Filled 2021-04-09: qty 90, 90d supply, fill #0

## 2021-02-27 MED ORDER — LOSARTAN POTASSIUM 100 MG PO TABS
100.0000 mg | ORAL_TABLET | Freq: Every day | ORAL | 0 refills | Status: DC
  Filled 2021-02-27 – 2021-04-09 (×2): qty 90, 90d supply, fill #0

## 2021-02-27 MED ORDER — LEVOTHYROXINE SODIUM 137 MCG PO TABS
137.0000 ug | ORAL_TABLET | Freq: Every day | ORAL | 5 refills | Status: DC
Start: 2021-02-27 — End: 2021-12-28
  Filled 2021-02-27 – 2021-04-09 (×2): qty 30, 30d supply, fill #0
  Filled 2021-06-07: qty 30, 30d supply, fill #1
  Filled 2021-09-06 – 2021-09-25 (×2): qty 30, 30d supply, fill #2

## 2021-02-27 NOTE — Telephone Encounter (Signed)
Requested Prescriptions  Pending Prescriptions Disp Refills  . hydrochlorothiazide (HYDRODIURIL) 25 MG tablet 90 tablet 0    Sig: TAKE 1 TABLET (25 MG TOTAL) BY MOUTH DAILY. MUST KEEP APPOINTMENT IN [facility] FOR MORE REFILLS.     Cardiovascular: Diuretics - Thiazide Failed - 02/26/2021  7:45 PM      Failed - Last BP in normal range    BP Readings from Last 1 Encounters:  08/21/20 (!) 222/101         Passed - Ca in normal range and within 360 days    Calcium  Date Value Ref Range Status  08/30/2020 9.4 8.7 - 10.2 mg/dL Final   Calcium, Ion  Date Value Ref Range Status  11/10/2007 1.08 (L)  Final         Passed - Cr in normal range and within 360 days    Creat  Date Value Ref Range Status  05/07/2016 1.11 (H) 0.50 - 1.05 mg/dL Final    Comment:      For patients > or = 57 years of age: The upper reference limit for Creatinine is approximately 13% higher for people identified as African-American.      Creatinine, Ser  Date Value Ref Range Status  08/30/2020 1.00 0.57 - 1.00 mg/dL Final   Creatinine,U  Date Value Ref Range Status  07/27/2007   Final   147.1 (NOTE)  Cutoff Values for Urine Drug Screen:        Drug Class           Cutoff (ng/mL)        Amphetamines            1000        Barbiturates             200        Cocaine Metabolites      300        Benzodiazepines          200        Methadone                 300        Opiates                 2000        Phencyclidine             25        Propoxyphene             300        Marijuana Metabolites     50  For medical purposes only.         Passed - K in normal range and within 360 days    Potassium  Date Value Ref Range Status  08/30/2020 3.8 3.5 - 5.2 mmol/L Final         Passed - Na in normal range and within 360 days    Sodium  Date Value Ref Range Status  08/30/2020 140 134 - 144 mmol/L Final         Passed - Valid encounter within last 6 months    Recent Outpatient Visits          4 months ago  Essential hypertension   [facility] Marcine Matar, MD   6 months ago Essential hypertension   [place] [facility] Marcine Matar, MD   1 year ago Essential hypertension  [facility] [place], [place] J, NP   1 year ago Gastroesophageal reflux disease without esophagitis   [place] [facility] Marcine Matar, MD   1 year ago Essential hypertension   [place] [facility] Marcine Matar, MD      Future Appointments            In 1 week Cleophas Dunker, Claude Manges, MD [facility]           . levothyroxine (SYNTHROID) 137 MCG tablet 30 tablet 5    Sig: Take 1 tablet (137 mcg total) by mouth daily before breakfast.     Endocrinology:  Hypothyroid Agents Failed - 02/26/2021  7:45 PM      Failed - TSH needs to be rechecked within 3 months after an abnormal result. Refill until TSH is due.      Passed - TSH in normal range and within 360 days    TSH  Date Value Ref Range Status  08/30/2020 2.030 0.450 - 4.500 uIU/mL Final         Passed - Valid encounter within last 12 months    Recent Outpatient Visits          4 months ago Essential hypertension   [place] [facility] Marcine Matar, MD   6 months ago Essential hypertension   [place] [facility] Marcine Matar, MD   1 year ago Essential hypertension   [place] [facility] [place], [place], NP   1 year ago Gastroesophageal reflux disease without esophagitis   [place] [facility] Marcine Matar, MD   1 year ago Essential hypertension   [place] [facility] Marcine Matar, MD      Future Appointments            In 1 week Cleophas Dunker Claude Manges, MD [facility]           . losartan (COZAAR) 100 MG tablet 90 tablet 2    Sig: Take 1 tablet (100 mg  total) by mouth daily.     Cardiovascular:  Angiotensin Receptor Blockers Failed - 02/26/2021  7:45 PM      Failed - Last BP in normal range    BP Readings from Last 1 Encounters:  08/21/20 (!) 222/101         Passed - Cr in normal range and within 180 days    Creat  Date Value Ref Range Status  05/07/2016 1.11 (H) 0.50 - 1.05 mg/dL Final    Comment:      For patients > or = 57 years of age: The upper reference limit for Creatinine is approximately 13% higher for people identified as African-American.      Creatinine, Ser  Date Value Ref Range Status  08/30/2020 1.00 0.57 - 1.00 mg/dL Final   Creatinine,U  Date Value Ref Range Status  07/27/2007   Final   147.1 (NOTE)  Cutoff Values for Urine Drug Screen:        Drug Class           Cutoff (ng/mL)        Amphetamines            1000        Barbiturates             200        Cocaine  Metabolites      300        Benzodiazepines          200        Methadone                 300        Opiates                 2000        Phencyclidine             25        Propoxyphene             300        Marijuana Metabolites     50  For medical purposes only.         Passed - K in normal range and within 180 days    Potassium  Date Value Ref Range Status  08/30/2020 3.8 3.5 - 5.2 mmol/L Final         Passed - Patient is not pregnant      Passed - Valid encounter within last 6 months    Recent Outpatient Visits          4 months ago Essential hypertension    [place] [facility] Marcine Matar, MD   6 months ago Essential hypertension   [place] [facility] Marcine Matar, MD   1 year ago Essential hypertension   [place] [facility] [place], [place], NP   1 year ago Gastroesophageal reflux disease without esophagitis   [place] [facility] Marcine Matar, MD   1 year ago Essential hypertension   [place] [facility]  Marcine Matar, MD      Future Appointments            In 1 week Cleophas Dunker Claude Manges, MD [facility]

## 2021-02-28 ENCOUNTER — Other Ambulatory Visit: Payer: Self-pay

## 2021-03-07 ENCOUNTER — Ambulatory Visit: Payer: Medicaid Other | Admitting: Orthopaedic Surgery

## 2021-03-13 ENCOUNTER — Ambulatory Visit: Payer: Medicaid Other | Admitting: Orthopaedic Surgery

## 2021-03-21 ENCOUNTER — Ambulatory Visit: Payer: Medicaid Other | Admitting: Orthopaedic Surgery

## 2021-04-09 ENCOUNTER — Other Ambulatory Visit: Payer: Self-pay

## 2021-06-07 ENCOUNTER — Other Ambulatory Visit: Payer: Self-pay

## 2021-06-08 ENCOUNTER — Other Ambulatory Visit: Payer: Self-pay

## 2021-06-19 ENCOUNTER — Telehealth: Payer: Self-pay | Admitting: Pharmacist

## 2021-06-19 NOTE — Patient Outreach (Signed)
Patient appearing on report for [facility] Hypertension Control due to last documented ambulatory blood pressure of 222/101 on 08/21/20. Next appointment with PCP is not scheduled.   ? ?Outreached patient to discuss hypertension control and medication management. Reviewed medications:  ? ?Medications Reviewed Today   ? ? Reviewed by Lourena Simmonds, RPH-CPP (Pharmacist) on 06/19/21 at 1105  Med List Status: <None>  ? ?Medication Order Taking? Sig Documenting Provider Last Dose Status Informant  ?celecoxib (CELEBREX) 200 MG capsule [id number] No Take 1 capsule (200 mg total) by mouth daily.  ?Patient not taking: Reported on 06/19/2021  ? Marcine Matar, MD Not Taking Active   ?fluticasone (FLONASE) 50 MCG/ACT nasal spray [id number] No Place 1 spray into both nostrils daily as needed for allergies or rhinitis.  ?Patient not taking: Reported on 06/19/2021  ? Marcine Matar, MD Not Taking Active   ?hydrochlorothiazide (HYDRODIURIL) 25 MG tablet [id number] Yes TAKE 1 TABLET (25 MG TOTAL) BY MOUTH DAILY. MUST KEEP APPOINTMENT IN [facility] FOR MORE REFILLS. Marcine Matar, MD Taking Active   ?labetalol (NORMODYNE) 100 MG tablet [id number] No TAKE 1 TABLET (100 MG TOTAL) BY MOUTH 2 (TWO) TIMES DAILY.  ?Patient not taking: Reported on 06/19/2021  ? Marcine Matar, MD Not Taking Active   ?levothyroxine (SYNTHROID) 137 MCG tablet [id number] Yes Take 1 tablet (137 mcg total) by mouth daily before breakfast. Marcine Matar, MD Taking Active   ?losartan (COZAAR) 100 MG tablet [id number] Yes Take 1 tablet (100 mg total) by mouth daily. Marcine Matar, MD Taking Active   ?omeprazole (PRILOSEC) 20 MG capsule [id number] No Take 1 capsule (20 mg total) by mouth daily.  ?Patient not taking: Reported on 06/19/2021  ? Marcine Matar, MD Not Taking Active   ? ?  ?  ? ?  ? ?Confirms she is taking losartan 100 mg daily and HCTZ 25 mg daily, she is NOT taking labetolol at this time.  ? ?She does NOT have a home BP  machine. Discussed that [facility] can bill Medicaid DME and she can pick up or they can deliver. She is interested in a prescription being sent here.  ? ?Discussed that she is due for follow up with Dr. Laural Benes, and we discussed embedded pharmacy services. Patient interested. Requested that the office call her to schedule follow up. Will collaborate with PCP and PharmD on this.  ? ?Catie Feliz Beam, PharmD, BCACP ?[facility] ?[phone] ? ?

## 2021-06-29 ENCOUNTER — Ambulatory Visit: Payer: Self-pay

## 2021-07-11 ENCOUNTER — Telehealth: Payer: Self-pay | Admitting: Pharmacist

## 2021-07-11 NOTE — Patient Outreach (Signed)
Patient appearing on report for [facility] Hypertension Control due to last documented ambulatory blood pressure of 222/101 on 08/21/20. Next appointment with PCP is not scheduled.   ?  ?Outreached patient to discuss hypertension control and medication management. Called patient for follow up. Left voicemail for her to return my call at her convenience.  ? ?Catie Eppie Gibson, PharmD, BCACP ?[facility] ?[phone] ? ?

## 2021-07-16 NOTE — Patient Outreach (Signed)
Called patient on all listed numbers and sent MyChart message in attempt to connect regarding hypertension.  ? ?Ann Jarvis, PharmD, BCACP ?[facility] ?[phone] ? ?

## 2021-08-29 ENCOUNTER — Other Ambulatory Visit: Payer: Self-pay

## 2021-09-06 ENCOUNTER — Other Ambulatory Visit: Payer: Self-pay

## 2021-09-06 ENCOUNTER — Other Ambulatory Visit: Payer: Self-pay | Admitting: Internal Medicine

## 2021-09-06 DIAGNOSIS — I10 Essential (primary) hypertension: Secondary | ICD-10-CM

## 2021-09-06 NOTE — Telephone Encounter (Signed)
Requested medication (s) are due for refill today- yes  Requested medication (s) are on the active medication list -yes  Future visit scheduled -no  Last refill: 02/27/21 #90  Notes to clinic: patient has not responded to outreach by office- fails visit protocol- last RF has notes.  Requested Prescriptions  Pending Prescriptions Disp Refills   hydrochlorothiazide (HYDRODIURIL) 25 MG tablet 90 tablet 0    Sig: TAKE 1 TABLET (25 MG TOTAL) BY MOUTH DAILY. MUST KEEP APPOINTMENT IN [facility] FOR MORE REFILLS.     Cardiovascular: Diuretics - Thiazide Failed - 09/06/2021 11:11 AM      Failed - Cr in normal range and within 180 days    Creat  Date Value Ref Range Status  05/07/2016 1.11 (H) 0.50 - 1.05 mg/dL Final    Comment:      For patients > or = 58 years of age: The upper reference limit for Creatinine is approximately 13% higher for people identified as African-American.      Creatinine, Ser  Date Value Ref Range Status  08/30/2020 1.00 0.57 - 1.00 mg/dL Final   Creatinine,U  Date Value Ref Range Status  07/27/2007   Final   147.1 (NOTE)  Cutoff Values for Urine Drug Screen:        Drug Class           Cutoff (ng/mL)        Amphetamines            1000        Barbiturates             200        Cocaine Metabolites      300        Benzodiazepines          200        Methadone                 300        Opiates                 2000        Phencyclidine             25        Propoxyphene             300        Marijuana Metabolites     50  For medical purposes only.         Failed - K in normal range and within 180 days    Potassium  Date Value Ref Range Status  08/30/2020 3.8 3.5 - 5.2 mmol/L Final         Failed - Na in normal range and within 180 days    Sodium  Date Value Ref Range Status  08/30/2020 140 134 - 144 mmol/L Final         Failed - Last BP in normal range    BP Readings from Last 1 Encounters:  08/21/20 (!) 222/101         Failed - Valid encounter within  last 6 months    Recent Outpatient Visits           10 months ago Essential hypertension   [place] Ladell Pier, MD   1 year ago Essential hypertension   [place] Ladell Pier, MD   1 year ago Essential hypertension   [place], [place] J, NP   2 years ago Gastroesophageal reflux disease without esophagitis   [place] Ladell Pier, MD   2 years ago Essential hypertension   [place], Deborah B, MD               losartan (COZAAR) 100 MG tablet 90 tablet 0    Sig: Take 1 tablet (100 mg total) by mouth daily.     Cardiovascular:  Angiotensin Receptor Blockers Failed - 09/06/2021 11:11 AM      Failed - Cr in normal range and within 180 days    Creat  Date Value Ref Range Status  05/07/2016 1.11 (H) 0.50 - 1.05 mg/dL Final    Comment:      For patients > or = 58 years of age: The upper reference limit for Creatinine is approximately 13% higher for people identified as African-American.      Creatinine, Ser  Date Value Ref Range Status  08/30/2020 1.00 0.57 - 1.00 mg/dL Final   Creatinine,U  Date Value Ref Range Status  07/27/2007   Final   147.1 (NOTE)  Cutoff Values for Urine Drug Screen:        Drug Class           Cutoff (ng/mL)        Amphetamines            1000        Barbiturates             200        Cocaine Metabolites      300        Benzodiazepines          200        Methadone                 300        Opiates                 2000        Phencyclidine             25        Propoxyphene             300        Marijuana Metabolites     50  For medical purposes only.         Failed - K in normal range and within 180 days    Potassium  Date Value Ref Range Status  08/30/2020 3.8 3.5 - 5.2 mmol/L Final         Failed - Last BP in normal range    BP Readings from Last  1 Encounters:  08/21/20 (!) 222/101         Failed - Valid encounter within last 6 months    Recent Outpatient Visits           10 months ago Essential hypertension   [place], MD   1 year ago Essential hypertension   [place], Deborah B, MD   1 year ago Essential hypertension   [place], [place] J, NP   2 years ago Gastroesophageal reflux disease without esophagitis   [place] Ladell Pier, MD   2 years ago Essential hypertension  [place] Ladell Pier, MD              Passed - Patient is not pregnant         Requested Prescriptions  Pending Prescriptions Disp Refills   hydrochlorothiazide (HYDRODIURIL) 25 MG tablet 90 tablet 0    Sig: TAKE 1 TABLET (25 MG TOTAL) BY MOUTH DAILY. MUST KEEP APPOINTMENT IN [facility] FOR MORE REFILLS.     Cardiovascular: Diuretics - Thiazide Failed - 09/06/2021 11:11 AM      Failed - Cr in normal range and within 180 days    Creat  Date Value Ref Range Status  05/07/2016 1.11 (H) 0.50 - 1.05 mg/dL Final    Comment:      For patients > or = 58 years of age: The upper reference limit for Creatinine is approximately 13% higher for people identified as African-American.      Creatinine, Ser  Date Value Ref Range Status  08/30/2020 1.00 0.57 - 1.00 mg/dL Final   Creatinine,U  Date Value Ref Range Status  07/27/2007   Final   147.1 (NOTE)  Cutoff Values for Urine Drug Screen:        Drug Class           Cutoff (ng/mL)        Amphetamines            1000        Barbiturates             200        Cocaine Metabolites      300        Benzodiazepines          200        Methadone                 300        Opiates                 2000        Phencyclidine             25        Propoxyphene             300        Marijuana Metabolites      50  For medical purposes only.         Failed - K in normal range and within 180 days    Potassium  Date Value Ref Range Status  08/30/2020 3.8 3.5 - 5.2 mmol/L Final         Failed - Na in normal range and within 180 days    Sodium  Date Value Ref Range Status  08/30/2020 140 134 - 144 mmol/L Final         Failed - Last BP in normal range    BP Readings from Last 1 Encounters:  08/21/20 (!) 222/101         Failed - Valid encounter within last 6 months    Recent Outpatient Visits           10 months ago Essential hypertension   [place], Deborah B, MD   1 year ago Essential hypertension   [place], Deborah B, MD   1 year ago Essential hypertension   [place], [place], NP   2 years ago  Gastroesophageal reflux disease without esophagitis   [place] Ladell Pier, MD   2 years ago Essential hypertension   [place], Deborah B, MD               losartan (COZAAR) 100 MG tablet 90 tablet 0    Sig: Take 1 tablet (100 mg total) by mouth daily.     Cardiovascular:  Angiotensin Receptor Blockers Failed - 09/06/2021 11:11 AM      Failed - Cr in normal range and within 180 days    Creat  Date Value Ref Range Status  05/07/2016 1.11 (H) 0.50 - 1.05 mg/dL Final    Comment:      For patients > or = 58 years of age: The upper reference limit for Creatinine is approximately 13% higher for people identified as African-American.      Creatinine, Ser  Date Value Ref Range Status  08/30/2020 1.00 0.57 - 1.00 mg/dL Final   Creatinine,U  Date Value Ref Range Status  07/27/2007   Final   147.1 (NOTE)  Cutoff Values for Urine Drug Screen:        Drug Class           Cutoff (ng/mL)        Amphetamines            1000        Barbiturates             200        Cocaine Metabolites       300        Benzodiazepines          200        Methadone                 300        Opiates                 2000        Phencyclidine             25        Propoxyphene             300        Marijuana Metabolites     50  For medical purposes only.         Failed - K in normal range and within 180 days    Potassium  Date Value Ref Range Status  08/30/2020 3.8 3.5 - 5.2 mmol/L Final         Failed - Last BP in normal range    BP Readings from Last 1 Encounters:  08/21/20 (!) 222/101         Failed - Valid encounter within last 6 months    Recent Outpatient Visits           10 months ago Essential hypertension   [place], MD   1 year ago Essential hypertension   [place], MD   1 year ago Essential hypertension   [place], [place] J, NP   2 years ago Gastroesophageal reflux disease without esophagitis   [place] Ladell Pier, MD   2 years ago Essential hypertension   [place], Deborah B, MD  Passed - Patient is not pregnant

## 2021-09-07 ENCOUNTER — Other Ambulatory Visit: Payer: Self-pay

## 2021-09-17 ENCOUNTER — Ambulatory Visit: Payer: Medicaid Other | Admitting: Orthopedic Surgery

## 2021-09-19 ENCOUNTER — Other Ambulatory Visit: Payer: Self-pay

## 2021-09-25 ENCOUNTER — Other Ambulatory Visit: Payer: Self-pay

## 2021-09-25 ENCOUNTER — Other Ambulatory Visit: Payer: Self-pay | Admitting: Internal Medicine

## 2021-09-25 DIAGNOSIS — I10 Essential (primary) hypertension: Secondary | ICD-10-CM

## 2021-09-25 MED ORDER — LOSARTAN POTASSIUM 100 MG PO TABS
100.0000 mg | ORAL_TABLET | Freq: Every day | ORAL | 0 refills | Status: DC
  Filled 2021-09-25: qty 30, 30d supply, fill #0

## 2021-09-25 MED ORDER — HYDROCHLOROTHIAZIDE 25 MG PO TABS
25.0000 mg | ORAL_TABLET | Freq: Every day | ORAL | 0 refills | Status: DC
  Filled 2021-09-25: qty 30, 30d supply, fill #0

## 2021-09-25 NOTE — Telephone Encounter (Signed)
Patient scheduled a bp medication follow up with the PA but would like a short supply to hold her over until 10/25/2021 appointment.   [facility] Phone:  [phone]  Fax:  [phone]

## 2021-09-25 NOTE — Telephone Encounter (Signed)
Requested medication (s) are due for refill today:   Yes for both  Requested medication (s) are on the active medication list:   Yes for both  Future visit scheduled:   No   Last ordered: HCTZ 02/27/2021 #90, 0 refills;   losartan 02/27/2021 #90, 0 refills  Returned because Pt has not responded to outreach by office/pharmacists with several attempts, labs are overdue, Notes on last refill request.    Requested Prescriptions  Pending Prescriptions Disp Refills   hydrochlorothiazide (HYDRODIURIL) 25 MG tablet 90 tablet 0    Sig: TAKE 1 TABLET (25 MG TOTAL) BY MOUTH DAILY. MUST KEEP APPOINTMENT IN [facility] FOR MORE REFILLS.     Cardiovascular: Diuretics - Thiazide Failed - 09/25/2021  8:56 AM      Failed - Cr in normal range and within 180 days    Creat  Date Value Ref Range Status  05/07/2016 1.11 (H) 0.50 - 1.05 mg/dL Final    Comment:      For patients > or = 58 years of age: The upper reference limit for Creatinine is approximately 13% higher for people identified as African-American.      Creatinine, Ser  Date Value Ref Range Status  08/30/2020 1.00 0.57 - 1.00 mg/dL Final   Creatinine,U  Date Value Ref Range Status  07/27/2007   Final   147.1 (NOTE)  Cutoff Values for Urine Drug Screen:        Drug Class           Cutoff (ng/mL)        Amphetamines            1000        Barbiturates             200        Cocaine Metabolites      300        Benzodiazepines          200        Methadone                 300        Opiates                 2000        Phencyclidine             25        Propoxyphene             300        Marijuana Metabolites     50  For medical purposes only.         Failed - K in normal range and within 180 days    Potassium  Date Value Ref Range Status  08/30/2020 3.8 3.5 - 5.2 mmol/L Final         Failed - Na in normal range and within 180 days    Sodium  Date Value Ref Range Status  08/30/2020 140 134 - 144 mmol/L Final         Failed - Last BP  in normal range    BP Readings from Last 1 Encounters:  08/21/20 (!) 222/101         Failed - Valid encounter within last 6 months    Recent Outpatient Visits           11 months ago Essential hypertension    [facility] Marcine Matar, MD   1  year ago Essential hypertension   [place] [facility] Marcine Matar, MD   1 year ago Essential hypertension   [place] [facility] [place], [place] J, NP   2 years ago Gastroesophageal reflux disease without esophagitis   [place] [facility] Marcine Matar, MD   2 years ago Essential hypertension   [place] [facility] Marcine Matar, MD               losartan (COZAAR) 100 MG tablet 90 tablet 0    Sig: Take 1 tablet (100 mg total) by mouth daily.     Cardiovascular:  Angiotensin Receptor Blockers Failed - 09/25/2021  8:56 AM      Failed - Cr in normal range and within 180 days    Creat  Date Value Ref Range Status  05/07/2016 1.11 (H) 0.50 - 1.05 mg/dL Final    Comment:      For patients > or = 58 years of age: The upper reference limit for Creatinine is approximately 13% higher for people identified as African-American.      Creatinine, Ser  Date Value Ref Range Status  08/30/2020 1.00 0.57 - 1.00 mg/dL Final   Creatinine,U  Date Value Ref Range Status  07/27/2007   Final   147.1 (NOTE)  Cutoff Values for Urine Drug Screen:        Drug Class           Cutoff (ng/mL)        Amphetamines            1000        Barbiturates             200        Cocaine Metabolites      300        Benzodiazepines          200        Methadone                 300        Opiates                 2000        Phencyclidine             25        Propoxyphene             300        Marijuana Metabolites     50  For medical purposes only.         Failed - K in normal range and within 180 days    Potassium  Date Value Ref  Range Status  08/30/2020 3.8 3.5 - 5.2 mmol/L Final         Failed - Last BP in normal range    BP Readings from Last 1 Encounters:  08/21/20 (!) 222/101         Failed - Valid encounter within last 6 months    Recent Outpatient Visits           11 months ago Essential hypertension   [place] [facility] Marcine Matar, MD   1 year ago Essential hypertension   [place] [facility] Marcine Matar, MD   1 year ago Essential hypertension   [place] [facility] [place], [place], NP   2  years ago Gastroesophageal reflux disease without esophagitis    [facility] Marcine Matar, MD   2 years ago Essential hypertension   [facility] Marcine Matar, MD              Passed - Patient is not pregnant

## 2021-09-26 ENCOUNTER — Other Ambulatory Visit: Payer: Self-pay

## 2021-10-03 ENCOUNTER — Other Ambulatory Visit: Payer: Self-pay

## 2021-10-12 ENCOUNTER — Other Ambulatory Visit: Payer: Self-pay

## 2021-10-25 ENCOUNTER — Ambulatory Visit: Payer: Medicaid Other | Admitting: Physician Assistant

## 2021-12-03 ENCOUNTER — Inpatient Hospital Stay (HOSPITAL_COMMUNITY): Payer: Medicaid Other

## 2021-12-03 ENCOUNTER — Encounter (HOSPITAL_COMMUNITY): Payer: Self-pay | Admitting: Emergency Medicine

## 2021-12-03 ENCOUNTER — Other Ambulatory Visit: Payer: Self-pay

## 2021-12-03 ENCOUNTER — Inpatient Hospital Stay (HOSPITAL_COMMUNITY)
Admission: EM | Admit: 2021-12-03 | Discharge: 2021-12-05 | DRG: 065 | Disposition: A | Discharge status: Home / Self Care | Payer: Medicaid Other | Attending: Internal Medicine | Admitting: Internal Medicine

## 2021-12-03 ENCOUNTER — Emergency Department (HOSPITAL_COMMUNITY): Payer: Medicaid Other

## 2021-12-03 DIAGNOSIS — Z91148 Patient's other noncompliance with medication regimen for other reason: Secondary | ICD-10-CM | POA: Diagnosis not present

## 2021-12-03 DIAGNOSIS — R471 Dysarthria and anarthria: Secondary | ICD-10-CM | POA: Diagnosis present

## 2021-12-03 DIAGNOSIS — Z881 Allergy status to other antibiotic agents status: Secondary | ICD-10-CM

## 2021-12-03 DIAGNOSIS — E876 Hypokalemia: Secondary | ICD-10-CM | POA: Diagnosis present

## 2021-12-03 DIAGNOSIS — I6930 Unspecified sequelae of cerebral infarction: Secondary | ICD-10-CM | POA: Diagnosis present

## 2021-12-03 DIAGNOSIS — R278 Other lack of coordination: Secondary | ICD-10-CM | POA: Diagnosis present

## 2021-12-03 DIAGNOSIS — Z79899 Other long term (current) drug therapy: Secondary | ICD-10-CM | POA: Diagnosis not present

## 2021-12-03 DIAGNOSIS — I639 Cerebral infarction, unspecified: Secondary | ICD-10-CM

## 2021-12-03 DIAGNOSIS — G8191 Hemiplegia, unspecified affecting right dominant side: Secondary | ICD-10-CM | POA: Diagnosis present

## 2021-12-03 DIAGNOSIS — I160 Hypertensive urgency: Secondary | ICD-10-CM | POA: Diagnosis present

## 2021-12-03 DIAGNOSIS — E039 Hypothyroidism, unspecified: Secondary | ICD-10-CM | POA: Diagnosis present

## 2021-12-03 DIAGNOSIS — I6381 Other cerebral infarction due to occlusion or stenosis of small artery: Principal | ICD-10-CM | POA: Diagnosis present

## 2021-12-03 DIAGNOSIS — Z79890 Hormone replacement therapy (postmenopausal): Secondary | ICD-10-CM

## 2021-12-03 DIAGNOSIS — F1010 Alcohol abuse, uncomplicated: Secondary | ICD-10-CM | POA: Diagnosis present

## 2021-12-03 DIAGNOSIS — F17210 Nicotine dependence, cigarettes, uncomplicated: Secondary | ICD-10-CM | POA: Diagnosis present

## 2021-12-03 DIAGNOSIS — R29705 NIHSS score 5: Secondary | ICD-10-CM | POA: Diagnosis present

## 2021-12-03 DIAGNOSIS — Z20822 Contact with and (suspected) exposure to covid-19: Secondary | ICD-10-CM | POA: Diagnosis present

## 2021-12-03 DIAGNOSIS — Z888 Allergy status to other drugs, medicaments and biological substances status: Secondary | ICD-10-CM | POA: Diagnosis not present

## 2021-12-03 DIAGNOSIS — F1210 Cannabis abuse, uncomplicated: Secondary | ICD-10-CM | POA: Diagnosis present

## 2021-12-03 DIAGNOSIS — F17200 Nicotine dependence, unspecified, uncomplicated: Secondary | ICD-10-CM | POA: Diagnosis not present

## 2021-12-03 DIAGNOSIS — E890 Postprocedural hypothyroidism: Secondary | ICD-10-CM | POA: Diagnosis not present

## 2021-12-03 DIAGNOSIS — Z880 Allergy status to penicillin: Secondary | ICD-10-CM

## 2021-12-03 DIAGNOSIS — F1410 Cocaine abuse, uncomplicated: Secondary | ICD-10-CM | POA: Diagnosis present

## 2021-12-03 DIAGNOSIS — T465X6A Underdosing of other antihypertensive drugs, initial encounter: Secondary | ICD-10-CM | POA: Diagnosis present

## 2021-12-03 DIAGNOSIS — I6389 Other cerebral infarction: Secondary | ICD-10-CM | POA: Diagnosis not present

## 2021-12-03 DIAGNOSIS — I10 Essential (primary) hypertension: Secondary | ICD-10-CM | POA: Diagnosis present

## 2021-12-03 LAB — COMPREHENSIVE METABOLIC PANEL
ALT: 9 U/L (ref 0–44)
AST: 17 U/L (ref 15–41)
Albumin: 3 g/dL — ABNORMAL LOW (ref 3.5–5.0)
Alkaline Phosphatase: 60 U/L (ref 38–126)
Anion gap: 7 (ref 5–15)
BUN: 17 mg/dL (ref 6–20)
CO2: 27 mmol/L (ref 22–32)
Calcium: 8.7 mg/dL — ABNORMAL LOW (ref 8.9–10.3)
Chloride: 105 mmol/L (ref 98–111)
Creatinine, Ser: 1.05 mg/dL — ABNORMAL HIGH (ref 0.44–1.00)
GFR, Estimated: >60 mL/min (ref >60)
Glucose, Bld: 123 mg/dL — ABNORMAL HIGH (ref 70–99)
Potassium: 3.3 mmol/L — ABNORMAL LOW (ref 3.5–5.1)
Sodium: 139 mmol/L (ref 135–145)
Total Bilirubin: 0.6 mg/dL (ref 0.3–1.2)
Total Protein: 7.9 g/dL (ref 6.5–8.1)

## 2021-12-03 LAB — CBC
HCT: 40.1 % (ref 36.0–46.0)
Hemoglobin: 12.7 g/dL (ref 12.0–15.0)
MCH: 28.9 pg (ref 26.0–34.0)
MCHC: 31.7 g/dL (ref 30.0–36.0)
MCV: 91.3 fL (ref 80.0–100.0)
Platelets: 234 10*3/uL (ref 150–400)
RBC: 4.39 MIL/uL (ref 3.87–5.11)
RDW: 13 % (ref 11.5–15.5)
WBC: 5.1 10*3/uL (ref 4.0–10.5)
nRBC: 0 % (ref 0.0–0.2)

## 2021-12-03 LAB — I-STAT CHEM 8, ED
BUN: 18 mg/dL (ref 6–20)
Calcium, Ion: 1.13 mmol/L — ABNORMAL LOW (ref 1.15–1.40)
Chloride: 102 mmol/L (ref 98–111)
Creatinine, Ser: 1 mg/dL (ref 0.44–1.00)
Glucose, Bld: 118 mg/dL — ABNORMAL HIGH (ref 70–99)
HCT: 40 % (ref 36.0–46.0)
Hemoglobin: 13.6 g/dL (ref 12.0–15.0)
Potassium: 3.1 mmol/L — ABNORMAL LOW (ref 3.5–5.1)
Sodium: 141 mmol/L (ref 135–145)
TCO2: 29 mmol/L (ref 22–32)

## 2021-12-03 LAB — PROTIME-INR
INR: 1 (ref 0.8–1.2)
Prothrombin Time: 12.8 seconds (ref 11.4–15.2)

## 2021-12-03 LAB — RESP PANEL BY RT-PCR (FLU A&B, COVID) ARPGX2
Influenza A by PCR: NEGATIVE
Influenza B by PCR: NEGATIVE
SARS Coronavirus 2 by RT PCR: NEGATIVE

## 2021-12-03 LAB — DIFFERENTIAL
Abs Immature Granulocytes: 0.01 10*3/uL (ref 0.00–0.07)
Basophils Absolute: 0.1 10*3/uL (ref 0.0–0.1)
Basophils Relative: 1 %
Eosinophils Absolute: 0.2 10*3/uL (ref 0.0–0.5)
Eosinophils Relative: 3 %
Immature Granulocytes: 0 %
Lymphocytes Relative: 23 %
Lymphs Abs: 1.2 10*3/uL (ref 0.7–4.0)
Monocytes Absolute: 0.5 10*3/uL (ref 0.1–1.0)
Monocytes Relative: 10 %
Neutro Abs: 3.2 10*3/uL (ref 1.7–7.7)
Neutrophils Relative %: 63 %

## 2021-12-03 LAB — I-STAT BETA HCG BLOOD, ED (MC, WL, AP ONLY): I-stat hCG, quantitative: <5 m[IU]/mL (ref <5)

## 2021-12-03 LAB — RAPID URINE DRUG SCREEN, HOSP PERFORMED
Amphetamines: NOT DETECTED
Barbiturates: NOT DETECTED
Benzodiazepines: NOT DETECTED
Cocaine: POSITIVE — AB
Opiates: NOT DETECTED
Tetrahydrocannabinol: NOT DETECTED

## 2021-12-03 LAB — URINALYSIS, ROUTINE W REFLEX MICROSCOPIC
Bacteria, UA: NONE SEEN
Bilirubin Urine: NEGATIVE
Glucose, UA: NEGATIVE mg/dL
Ketones, ur: NEGATIVE mg/dL
Leukocytes,Ua: NEGATIVE
Nitrite: NEGATIVE
Protein, ur: NEGATIVE mg/dL
Specific Gravity, Urine: 1.028 (ref 1.005–1.030)
pH: 8 (ref 5.0–8.0)

## 2021-12-03 LAB — APTT: aPTT: 36 seconds (ref 24–36)

## 2021-12-03 MED ORDER — ADULT MULTIVITAMIN W/MINERALS CH
1.0000 | ORAL_TABLET | Freq: Every day | ORAL | Status: DC
  Administered 2021-12-03 – 2021-12-05 (×3): 1 via ORAL
  Filled 2021-12-03 (×3): qty 1

## 2021-12-03 MED ORDER — STROKE: EARLY STAGES OF RECOVERY BOOK: Freq: Once | Status: DC

## 2021-12-03 MED ORDER — ASPIRIN 325 MG PO TABS: 325.0000 mg | ORAL_TABLET | Freq: Every day | ORAL | Status: DC

## 2021-12-03 MED ORDER — ACETAMINOPHEN 650 MG RE SUPP: 650.0000 mg | RECTAL | Status: DC | PRN

## 2021-12-03 MED ORDER — ASPIRIN 81 MG PO TBEC
81.0000 mg | DELAYED_RELEASE_TABLET | Freq: Every day | ORAL | Status: DC
Start: 2021-12-04 — End: 2021-12-03

## 2021-12-03 MED ORDER — SENNOSIDES-DOCUSATE SODIUM 8.6-50 MG PO TABS
1.0000 | ORAL_TABLET | Freq: Every evening | ORAL | Status: DC | PRN
Start: 2021-12-03 — End: 2021-12-05

## 2021-12-03 MED ORDER — ENOXAPARIN SODIUM 40 MG/0.4ML IJ SOSY
40.0000 mg | PREFILLED_SYRINGE | INTRAMUSCULAR | Status: DC
  Administered 2021-12-03 – 2021-12-04 (×2): 40 mg via SUBCUTANEOUS
  Filled 2021-12-03 (×2): qty 0.4

## 2021-12-03 MED ORDER — THIAMINE HCL 100 MG/ML IJ SOLN: 100.0000 mg | Freq: Every day | INTRAMUSCULAR | Status: DC

## 2021-12-03 MED ORDER — ASPIRIN 325 MG PO TABS
325.0000 mg | ORAL_TABLET | Freq: Once | ORAL | Status: AC
  Administered 2021-12-03: 325 mg via ORAL
  Filled 2021-12-03: qty 1

## 2021-12-03 MED ORDER — CLOPIDOGREL BISULFATE 75 MG PO TABS
75.0000 mg | ORAL_TABLET | Freq: Every day | ORAL | Status: DC
Start: 2021-12-04 — End: 2021-12-05
  Administered 2021-12-04 – 2021-12-05 (×2): 75 mg via ORAL
  Filled 2021-12-03 (×3): qty 1

## 2021-12-03 MED ORDER — LABETALOL HCL 200 MG PO TABS: 100.0000 mg | ORAL_TABLET | Freq: Two times a day (BID) | ORAL | Status: DC

## 2021-12-03 MED ORDER — LABETALOL HCL 5 MG/ML IV SOLN
10.0000 mg | Freq: Once | INTRAVENOUS | Status: AC
  Administered 2021-12-03: 10 mg via INTRAVENOUS
  Filled 2021-12-03: qty 4

## 2021-12-03 MED ORDER — LOSARTAN POTASSIUM 50 MG PO TABS
100.0000 mg | ORAL_TABLET | Freq: Every day | ORAL | Status: DC
  Administered 2021-12-04 – 2021-12-05 (×2): 100 mg via ORAL
  Filled 2021-12-03 (×3): qty 2

## 2021-12-03 MED ORDER — LORAZEPAM 2 MG/ML IJ SOLN: 1.0000 mg | Freq: Once | INTRAMUSCULAR | Status: DC | PRN

## 2021-12-03 MED ORDER — IOHEXOL 350 MG/ML SOLN
75.0000 mL | Freq: Once | INTRAVENOUS | Status: AC | PRN
  Administered 2021-12-03: 75 mL via INTRAVENOUS

## 2021-12-03 MED ORDER — LOSARTAN POTASSIUM 50 MG PO TABS
100.0000 mg | ORAL_TABLET | Freq: Once | ORAL | Status: AC
Start: 2021-12-03 — End: 2021-12-03
  Administered 2021-12-03: 100 mg via ORAL
  Filled 2021-12-03: qty 2

## 2021-12-03 MED ORDER — THIAMINE MONONITRATE 100 MG PO TABS
100.0000 mg | ORAL_TABLET | Freq: Every day | ORAL | Status: DC
  Administered 2021-12-03 – 2021-12-05 (×3): 100 mg via ORAL
  Filled 2021-12-03 (×3): qty 1

## 2021-12-03 MED ORDER — ACETAMINOPHEN 325 MG PO TABS: 650.0000 mg | ORAL_TABLET | ORAL | Status: DC | PRN

## 2021-12-03 MED ORDER — CLOPIDOGREL BISULFATE 75 MG PO TABS: 75.0000 mg | ORAL_TABLET | Freq: Every day | ORAL | Status: DC

## 2021-12-03 MED ORDER — ACETAMINOPHEN 160 MG/5ML PO SOLN: 650.0000 mg | ORAL | Status: DC | PRN

## 2021-12-03 MED ORDER — HYDRALAZINE HCL 20 MG/ML IJ SOLN
5.0000 mg | Freq: Four times a day (QID) | INTRAMUSCULAR | Status: DC | PRN
  Administered 2021-12-04 – 2021-12-05 (×3): 5 mg via INTRAVENOUS
  Filled 2021-12-03 (×3): qty 1

## 2021-12-03 MED ORDER — LEVOTHYROXINE SODIUM 25 MCG PO TABS
137.0000 ug | ORAL_TABLET | Freq: Every day | ORAL | Status: DC
  Administered 2021-12-03 – 2021-12-05 (×3): 137 ug via ORAL
  Filled 2021-12-03 (×3): qty 1

## 2021-12-03 MED ORDER — ATORVASTATIN CALCIUM 40 MG PO TABS
40.0000 mg | ORAL_TABLET | Freq: Every day | ORAL | Status: DC
  Administered 2021-12-03 – 2021-12-05 (×3): 40 mg via ORAL
  Filled 2021-12-03 (×3): qty 1

## 2021-12-03 MED ORDER — LORAZEPAM 2 MG/ML IJ SOLN: 1.0000 mg | INTRAMUSCULAR | Status: DC | PRN

## 2021-12-03 MED ORDER — CLOPIDOGREL BISULFATE 300 MG PO TABS
300.0000 mg | ORAL_TABLET | Freq: Once | ORAL | Status: AC
Start: 2021-12-03 — End: 2021-12-03
  Administered 2021-12-03: 300 mg via ORAL
  Filled 2021-12-03: qty 1

## 2021-12-03 MED ORDER — LORAZEPAM 1 MG PO TABS: 1.0000 mg | ORAL_TABLET | ORAL | Status: DC | PRN

## 2021-12-03 MED ORDER — ASPIRIN 81 MG PO TBEC
81.0000 mg | DELAYED_RELEASE_TABLET | Freq: Every day | ORAL | Status: DC
  Administered 2021-12-04 – 2021-12-05 (×2): 81 mg via ORAL
  Filled 2021-12-03 (×2): qty 1

## 2021-12-03 MED ORDER — ASPIRIN 81 MG PO TBEC: 81.0000 mg | DELAYED_RELEASE_TABLET | Freq: Every day | ORAL | Status: DC

## 2021-12-03 MED ORDER — FOLIC ACID 1 MG PO TABS
1.0000 mg | ORAL_TABLET | Freq: Every day | ORAL | Status: DC
  Administered 2021-12-03 – 2021-12-05 (×3): 1 mg via ORAL
  Filled 2021-12-03 (×3): qty 1

## 2021-12-03 NOTE — ED Notes (Signed)
ED TO INPATIENT HANDOFF REPORT  ED Nurse Name and Phone #:   S Name/Age/Gender Ann Jarvis 56 y.o. female Room/Bed: H022C/H022C  Code Status   Code Status: Full Code  Home/SNF/Other Home Patient oriented to: self, place, time, and situation Is this baseline? Yes   Triage Complete: Triage complete  Chief Complaint Acute CVA (cerebrovascular accident) [facility]) [I63.9]  Triage Note Patient here with stroke like symptoms, complains of R sided numbness, unbalanced gait, slurred speech that started 2 two days ago.    Allergies Allergies  Allergen Reactions   Ciprofloxacin Rash    Significant rash, taking with Flagyl   Flagyl [Metronidazole] Rash    Developed significant rash while taking with Cipro   Penicillins Swelling    Did it involve swelling of the face/tongue/throat, SOB, or low BP? No Did it involve sudden or severe rash/hives, skin peeling, or any reaction on the inside of your mouth or nose? No Did you need to seek medical attention at a hospital or doctor's office? No When did it last happen?  Pt states a long time ago     If all above answers are "NO", may proceed with cephalosporin use.     Level of Care/Admitting Diagnosis ED Disposition     ED Disposition  Admit   Condition  --   Comment  Hospital Area: [facility] [[id number]]  Level of Care: Telemetry Medical [[id number]]  May admit patient to [facility] or [facility] if equivalent level of care is available:: No  Covid Evaluation: Asymptomatic - no recent exposure (last 10 days) testing not required  Diagnosis: Acute CVA (cerebrovascular accident) [facility]) [[id number]]  Admitting Physician: Jonah Blue [[id number]]  Attending Physician: Jonah Blue [[id number]]  Certification:: I certify this patient will need inpatient services for at least 2 midnights  Estimated Length of Stay: 3          B Medical/Surgery History Past Medical History:  Diagnosis Date   Cocaine abuse (HCC)    Grave's  disease    s/p radioiodine ablation 03/05/10, F/B Dr. Chestine Spore. On Methimazole   Hypertension    Scabies    Tobacco abuse    Past Surgical History:  Procedure Laterality Date   BTL     MOUTH SURGERY       A IV Location/Drains/Wounds Patient Lines/Drains/Airways Status     Active Line/Drains/Airways     Name Placement date Placement time Site Days   Peripheral IV 12/03/21 18 G Anterior;Left;Upper Arm 12/03/21  1618  Arm  less than 1            Intake/Output Last 24 hours No intake or output data in the 24 hours ending 12/03/21 1646  Labs/Imaging Results for orders placed or performed during the hospital encounter of 12/03/21 (from the past 48 hour(s))  Resp Panel by RT-PCR (Flu A&B, Covid) Anterior Nasal Swab     Status: None   Collection Time: 12/03/21 12:51 PM   Specimen: Anterior Nasal Swab  Result Value Ref Range   SARS Coronavirus 2 by RT PCR NEGATIVE NEGATIVE    Comment: (NOTE) SARS-CoV-2 target nucleic acids are NOT DETECTED.  The SARS-CoV-2 RNA is generally detectable in upper respiratory specimens during the acute phase of infection. The lowest concentration of SARS-CoV-2 viral copies this assay can detect is 138 copies/mL. A negative result does not preclude SARS-Cov-2 infection and should not be used as the sole basis for treatment or other patient management decisions. A negative result may occur with  improper specimen collection/handling, submission of specimen other than nasopharyngeal swab, presence of viral mutation(s) within the areas targeted by this assay, and inadequate number of viral copies(<138 copies/mL). A negative result must be combined with clinical observations, patient history, and epidemiological information. The expected result is Negative.  Fact Sheet for Patients:  BloggerCourse.com  Fact Sheet for Healthcare Providers:  SeriousBroker.it  This test is no t yet approved or cleared  by the [place] FDA and  has been authorized for detection and/or diagnosis of SARS-CoV-2 by FDA under an Emergency Use Authorization (EUA). This EUA will remain  in effect (meaning this test can be used) for the duration of the COVID-19 declaration under Section 564(b)(1) of the Act, 21 U.S.C.section 360bbb-3(b)(1), unless the authorization is terminated  or revoked sooner.       Influenza A by PCR NEGATIVE NEGATIVE   Influenza B by PCR NEGATIVE NEGATIVE    Comment: (NOTE) The Xpert Xpress SARS-CoV-2/FLU/RSV plus assay is intended as an aid in the diagnosis of influenza from Nasopharyngeal swab specimens and should not be used as a sole basis for treatment. Nasal washings and aspirates are unacceptable for Xpert Xpress SARS-CoV-2/FLU/RSV testing.  Fact Sheet for Patients: BloggerCourse.com  Fact Sheet for Healthcare Providers: SeriousBroker.it  This test is not yet approved or cleared by the [place] FDA and has been authorized for detection and/or diagnosis of SARS-CoV-2 by FDA under an Emergency Use Authorization (EUA). This EUA will remain in effect (meaning this test can be used) for the duration of the COVID-19 declaration under Section 564(b)(1) of the Act, 21 U.S.C. section 360bbb-3(b)(1), unless the authorization is terminated or revoked.  Performed at [facility], [address] [place], [place] [address]   Protime-INR     Status: None   Collection Time: 12/03/21  1:25 PM  Result Value Ref Range   Prothrombin Time 12.8 11.4 - 15.2 seconds   INR 1.0 0.8 - 1.2    Comment: (NOTE) INR goal varies based on device and disease states. Performed at [facility], [address], [place], [place] [address]   APTT     Status: None   Collection Time: 12/03/21  1:25 PM  Result Value Ref Range   aPTT 36 24 - 36 seconds    Comment: Performed at [facility], [address], [place], [place]  [address]  CBC     Status: None   Collection Time: 12/03/21  1:25 PM  Result Value Ref Range   WBC 5.1 4.0 - 10.5 K/uL   RBC 4.39 3.87 - 5.11 MIL/uL   Hemoglobin 12.7 12.0 - 15.0 g/dL   HCT 42.3 53.6 - 14.4 %   MCV 91.3 80.0 - 100.0 fL   MCH 28.9 26.0 - 34.0 pg   MCHC 31.7 30.0 - 36.0 g/dL   RDW 31.5 40.0 - 86.7 %   Platelets 234 150 - 400 K/uL   nRBC 0.0 0.0 - 0.2 %    Comment: Performed at [facility], [address] [place], [place] [address]  Differential     Status: None   Collection Time: 12/03/21  1:25 PM  Result Value Ref Range   Neutrophils Relative % 63 %   Neutro Abs 3.2 1.7 - 7.7 K/uL   Lymphocytes Relative 23 %   Lymphs Abs 1.2 0.7 - 4.0 K/uL   Monocytes Relative 10 %   Monocytes Absolute 0.5 0.1 - 1.0 K/uL   Eosinophils Relative 3 %  Eosinophils Absolute 0.2 0.0 - 0.5 K/uL   Basophils Relative 1 %   Basophils Absolute 0.1 0.0 - 0.1 K/uL   Immature Granulocytes 0 %   Abs Immature Granulocytes 0.01 0.00 - 0.07 K/uL    Comment: Performed at [facility], [address] [place], [place] [address]  Comprehensive metabolic panel     Status: Abnormal   Collection Time: 12/03/21  1:25 PM  Result Value Ref Range   Sodium 139 135 - 145 mmol/L   Potassium 3.3 (L) 3.5 - 5.1 mmol/L   Chloride 105 98 - 111 mmol/L   CO2 27 22 - 32 mmol/L   Glucose, Bld 123 (H) 70 - 99 mg/dL    Comment: Glucose reference range applies only to samples taken after fasting for at least 8 hours.   BUN 17 6 - 20 mg/dL   Creatinine, Ser 8.111.05 (H) 0.44 - 1.00 mg/dL   Calcium 8.7 (L) 8.9 - 10.3 mg/dL   Total Protein 7.9 6.5 - 8.1 g/dL   Albumin 3.0 (L) 3.5 - 5.0 g/dL   AST 17 15 - 41 U/L   ALT 9 0 - 44 U/L   Alkaline Phosphatase 60 38 - 126 U/L   Total Bilirubin 0.6 0.3 - 1.2 mg/dL   GFR, Estimated >91>60 >47>60 mL/min    Comment: (NOTE) Calculated using the CKD-EPI Creatinine Equation (2021)    Anion gap 7 5 - 15    Comment: Performed at [facility], [address]  [place], [place] [address]  I-Stat beta hCG blood, ED     Status: None   Collection Time: 12/03/21  2:09 PM  Result Value Ref Range   I-stat hCG, quantitative <5.0 <5 mIU/mL   Comment 3            Comment:   GEST. AGE      CONC.  (mIU/mL)   <=1 WEEK        5 - 50     2 WEEKS       50 - 500     3 WEEKS       100 - 10,000     4 WEEKS     1,000 - 30,000        FEMALE AND NON-PREGNANT FEMALE:     LESS THAN 5 mIU/mL   I-stat chem 8, ED     Status: Abnormal   Collection Time: 12/03/21  2:11 PM  Result Value Ref Range   Sodium 141 135 - 145 mmol/L   Potassium 3.1 (L) 3.5 - 5.1 mmol/L   Chloride 102 98 - 111 mmol/L   BUN 18 6 - 20 mg/dL   Creatinine, Ser 2.131.00 0.44 - 1.00 mg/dL   Glucose, Bld 086118 (H) 70 - 99 mg/dL    Comment: Glucose reference range applies only to samples taken after fasting for at least 8 hours.   Calcium, Ion 1.13 (L) 1.15 - 1.40 mmol/L   TCO2 29 22 - 32 mmol/L   Hemoglobin 13.6 12.0 - 15.0 g/dL   HCT 57.840.0 46.936.0 - 62.946.0 %   CT HEAD WO CONTRAST  Result Date: 12/03/2021 CLINICAL DATA:  Right-sided numbness, off balance and slurred speech beginning 2 days ago. EXAM: CT HEAD WITHOUT CONTRAST TECHNIQUE: Contiguous axial images were obtained from the base of the skull through the vertex without intravenous contrast. RADIATION DOSE REDUCTION: This exam was performed according to the departmental dose-optimization program which includes automated exposure control, adjustment of the  mA and/or kV according to patient size and/or use of iterative reconstruction technique. COMPARISON:  03/11/2016 FINDINGS: Brain: No evidence of acute infarction, hemorrhage, hydrocephalus, extra-axial collection or mass lesion/mass effect. Vascular: No hyperdense vessel or unexpected calcification. Skull: Normal. Negative for fracture or focal lesion. Sinuses/Orbits: Globes and orbits are unremarkable. Visualized sinuses are clear. Other: None peer IMPRESSION: Normal unenhanced CT scan of the brain. Electronically  Signed   By: Amie Portland M.D.   On: 12/03/2021 14:01    Pending Labs Unresulted Labs (From admission, onward)     Start     Ordered   12/03/21 1641  HIV Antibody (routine testing w rflx)  (HIV Antibody (Routine testing w reflex) panel)  Once,   R        12/03/21 1643   12/03/21 1614  Ethanol  Once,   R        12/03/21 1613   12/03/21 1512  Lipid panel  Add-on,   AD        12/03/21 1511   12/03/21 1512  Hemoglobin A1c  Add-on,   AD        12/03/21 1511   12/03/21 1251  Urine rapid drug screen (hosp performed)  Once,   STAT        12/03/21 1250   12/03/21 1251  Urinalysis, Routine w reflex microscopic  Once,   URGENT        12/03/21 1250            Vitals/Pain Today's Vitals   12/03/21 1245 12/03/21 1248  BP: (!) 244/117   Pulse: 64   Resp: 20   Temp: 98.3 F (36.8 C)   TempSrc: Oral   SpO2: 100%   PainSc:  0-No pain    Isolation Precautions No active isolations  Medications Medications  losartan (COZAAR) tablet 100 mg (has no administration in time range)  labetalol (NORMODYNE) injection 10 mg (has no administration in time range)  clopidogrel (PLAVIX) tablet 300 mg (has no administration in time range)  aspirin tablet 325 mg (has no administration in time range)  aspirin EC tablet 81 mg (has no administration in time range)  clopidogrel (PLAVIX) tablet 75 mg (has no administration in time range)   stroke: early stages of recovery book (has no administration in time range)  LORazepam (ATIVAN) injection 1 mg (has no administration in time range)  labetalol (NORMODYNE) tablet 100 mg (has no administration in time range)  losartan (COZAAR) tablet 100 mg (has no administration in time range)  levothyroxine (SYNTHROID) tablet 137 mcg (has no administration in time range)  enoxaparin (LOVENOX) injection 40 mg (has no administration in time range)  acetaminophen (TYLENOL) tablet 650 mg (has no administration in time range)    Or  acetaminophen (TYLENOL) 160 MG/5ML  solution 650 mg (has no administration in time range)    Or  acetaminophen (TYLENOL) suppository 650 mg (has no administration in time range)  senna-docusate (Senokot-S) tablet 1 tablet (has no administration in time range)    Mobility Pt w/ unsteady gait and requires assistance w/ ambulation. This is not her baseline. High fall risk   Focused Assessments Neuro Assessment Handoff:  Swallow screen pass? Yes    NIH Stroke Scale ( + Modified Stroke Scale Criteria)  Interval: Initial Level of Consciousness (1a.)   : Alert, keenly responsive LOC Questions (1b. )   +: Answers both questions correctly LOC Commands (1c. )   + : Performs both tasks correctly Best Gaze (2. )  +:  Normal Visual (3. )  +: No visual loss Facial Palsy (4. )    : Minor paralysis (minimal on the R) Motor Arm, Left (5a. )   +: No drift Motor Arm, Right (5b. )   +: Drift Motor Leg, Left (6a. )   +: No drift Motor Leg, Right (6b. )   +: Drift Limb Ataxia (7. ): Absent Sensory (8. )   +: Mild-to-moderate sensory loss, patient feels pinprick is less sharp or is dull on the affected side, or there is a loss of superficial pain with pinprick, but patient is aware of being touched Best Language (9. )   +: No aphasia Dysarthria (10. ): Mild-to-moderate dysarthria, patient slurs at least some words and, at worst, can be understood with some difficulty Extinction/Inattention (11.)   +: No Abnormality Modified SS Total  +: 3 Complete NIHSS TOTAL: 5 Last date known well: 12/01/21   Neuro Assessment: Exceptions to [facility] Neuro Checks:   Initial (12/03/21 1559)  Last Documented NIHSS Modified Score: 3 (12/03/21 1559) Has TPA been given? No Pt is outside of treatment window    R Recommendations: See Admitting Provider Note  Report given to:   Additional Notes:

## 2021-12-03 NOTE — ED Notes (Signed)
Pt going to MRI

## 2021-12-03 NOTE — ED Notes (Signed)
Patient pass swallow screen

## 2021-12-03 NOTE — ED Provider Notes (Signed)
  Physical Exam  BP (!) 244/117 (BP Location: Right Arm)   Pulse 64   Temp 98.3 F (36.8 C) (Oral)   Resp 20   LMP 10/07/2015   SpO2 100%   Physical Exam  Procedures  Procedures  ED Course / MDM    Medical Decision Making Amount and/or Complexity of Data Reviewed Radiology: ordered.  Risk Prescription drug management. Decision regarding hospitalization.   40F, numbness and weakness in the right arm and leg and slurred speech. CTA ordered, MRI ordered, neuro consulted and recommended stroke workup, needs admission. Last normal 2d ago. Patient subsequently admitted for further stroke workup.       Ernie Avena, MD 12/03/21 2236

## 2021-12-03 NOTE — Consult Note (Signed)
Neurology Consultation  Reason for Consult: Stroke on CT Referring Physician: Anitra Lauth  CC: Right side weakness, numbness  History is obtained from:Patient, chart review  HPI: Ann Jarvis is a 56 y.o. female  hyperthyroidism, cocaine abuse, tobacco abuse, marijuana abuse and hypertension who has been out of her blood pressure medications for the last 1 to 2 months who is presenting today with complaint of numbness on the right side of her body, mild weakness, being off balance when she walks and difficulty with her speech. She states that she initially noticed this when she woke up yesterday morning. She noticed first that her speech was slurred. She reports a couple of falls from the weakness in her legs during the day yesterday. She also describes diminished sensation on the right upper and lower extremity. She endorses using cocaine, marijuana, and smoking cigarettes regularly. She is willing to quit using drugs and smoking, denies needing a nicotine patch or assistance with this. She does have access to obtain her medications and denies needing assistance at this time. She states that she ran out of her blood pressure medications and just didn't pick them up.    LKW: Evening of 12/01/2021 tpa given?: no, outside of window Premorbid modified Rankin scale (mRS):  0-Completely asymptomatic and back to baseline post-stroke  ROS: Full ROS was performed and is negative except as noted in the HPI.    Past Medical History:  Diagnosis Date   Cocaine abuse (HCC)    Grave's disease    s/p radioiodine ablation 03/05/10, F/B Dr. Chestine Spore. On Methimazole   Hypertension    Hyperthyroidism    Scabies    Thyroid condition    Tobacco abuse     History reviewed. No pertinent family history.  Social History:   reports that she has been smoking cigarettes. She has been smoking an average of .25 packs per day. She has never used smokeless tobacco. She reports current alcohol use of about 1.0 - 2.0  standard drink of alcohol per week. She reports current drug use. Drug: Marijuana. Cocaine  Medications  Current Facility-Administered Medications:    labetalol (NORMODYNE) injection 10 mg, 10 mg, Intravenous, Once, Plunkett, Whitney, MD   losartan (COZAAR) tablet 100 mg, 100 mg, Oral, Once, Gwyneth Sprout, MD  Current Outpatient Medications:    celecoxib (CELEBREX) 200 MG capsule, Take 1 capsule (200 mg total) by mouth daily. (Patient not taking: Reported on 06/19/2021), Disp: 90 capsule, Rfl: 3   fluticasone (FLONASE) 50 MCG/ACT nasal spray, Place 1 spray into both nostrils daily as needed for allergies or rhinitis. (Patient not taking: Reported on 06/19/2021), Disp: 16 g, Rfl: 0   hydrochlorothiazide (HYDRODIURIL) 25 MG tablet, Take 1 tablet (25 mg total) by mouth daily. MUST KEEP APPT 09/2021 for further refills, Disp: 30 tablet, Rfl: 0   labetalol (NORMODYNE) 100 MG tablet, TAKE 1 TABLET (100 MG TOTAL) BY MOUTH 2 (TWO) TIMES DAILY. (Patient not taking: Reported on 06/19/2021), Disp: 60 tablet, Rfl: 6   levothyroxine (SYNTHROID) 137 MCG tablet, Take 1 tablet (137 mcg total) by mouth daily before breakfast., Disp: 30 tablet, Rfl: 5   losartan (COZAAR) 100 MG tablet, Take 1 tablet (100 mg total) by mouth daily. Must keep appt 09/2021 for further refills, Disp: 30 tablet, Rfl: 0   omeprazole (PRILOSEC) 20 MG capsule, Take 1 capsule (20 mg total) by mouth daily. (Patient not taking: Reported on 06/19/2021), Disp: 60 capsule, Rfl: 3   Exam: Current vital signs: BP (!) 244/117 (BP Location: Right  Arm)   Pulse 64   Temp 98.3 F (36.8 C) (Oral)   Resp 20   LMP 10/07/2015   SpO2 100%  Vital signs in last 24 hours: Temp:  [98.3 F (36.8 C)] 98.3 F (36.8 C) (09/04 1245) Pulse Rate:  [64] 64 (09/04 1245) Resp:  [20] 20 (09/04 1245) BP: (244)/(117) 244/117 (09/04 1245) SpO2:  [100 %] 100 % (09/04 1245)  GENERAL: Awake, alert in NAD HEENT: - Normocephalic and atraumatic, dry mm, no LN++, no  Thyromegally LUNGS - Clear to auscultation bilaterally with no wheezes CV - S1S2 RRR, no m/r/g, equal pulses bilaterally. ABDOMEN - Soft, nontender, nondistended with normoactive BS Ext: warm, well perfused, intact peripheral pulses, no edema  NEURO:  Mental Status: Alert and oriented to self, location, situation. Able to state month but not year or date.  Language: speech is dysarthric.  Naming, repetition, fluency, and comprehension intact. Cranial Nerves: PERRL 3 mm/brisk. EOMI, visual fields full, mild right facial asymmetry at rest, facial sensation diminished on the right, hearing intact, tongue/uvula/soft palate midline, normal sternocleidomastoid and trapezius muscle strength. No evidence of tongue atrophy or fibrillations Motor:  RUE 4/5 with pronator drift LUE 5/5 RLE 3/5   LLE 5/5 Tone: is normal and bulk is normal Sensation- diminished to light touch and temperature on right hemibody, including cheek. Does split midline on forehead.  Coordination: FTN intact bilaterally, no ataxia in BLE. Gait- deferred   Labs I have reviewed labs in epic and the results pertinent to this consultation are:  CBC    Component Value Date/Time   WBC 5.1 12/03/2021 1325   RBC 4.39 12/03/2021 1325   HGB 13.6 12/03/2021 1411   HGB 13.3 11/11/2017 1431   HCT 40.0 12/03/2021 1411   HCT 41.9 11/11/2017 1431   PLT 234 12/03/2021 1325   PLT 250 11/11/2017 1431   MCV 91.3 12/03/2021 1325   MCV 88 11/11/2017 1431   MCH 28.9 12/03/2021 1325   MCHC 31.7 12/03/2021 1325   RDW 13.0 12/03/2021 1325   RDW 13.3 11/11/2017 1431   LYMPHSABS 1.2 12/03/2021 1325   MONOABS 0.5 12/03/2021 1325   EOSABS 0.2 12/03/2021 1325   BASOSABS 0.1 12/03/2021 1325    CMP     Component Value Date/Time   NA 141 12/03/2021 1411   NA 140 08/30/2020 1341   K 3.1 (L) 12/03/2021 1411   CL 102 12/03/2021 1411   CO2 27 12/03/2021 1325   GLUCOSE 118 (H) 12/03/2021 1411   BUN 18 12/03/2021 1411   BUN 18 08/30/2020  1341   CREATININE 1.00 12/03/2021 1411   CREATININE 1.11 (H) 05/07/2016 1409   CALCIUM 8.7 (L) 12/03/2021 1325   PROT 7.9 12/03/2021 1325   PROT 7.6 08/30/2020 1341   ALBUMIN 3.0 (L) 12/03/2021 1325   ALBUMIN 3.4 (L) 08/30/2020 1341   AST 17 12/03/2021 1325   ALT 9 12/03/2021 1325   ALKPHOS 60 12/03/2021 1325   BILITOT 0.6 12/03/2021 1325   BILITOT 0.4 08/30/2020 1341   GFRNONAA >60 12/03/2021 1325   GFRNONAA 57 (L) 05/07/2016 1409   GFRAA >60 10/28/2019 1950   GFRAA 66 05/07/2016 1409    Lipid Panel     Component Value Date/Time   CHOL 165 03/19/2019 1634   TRIG 60 03/19/2019 1634   HDL 75 03/19/2019 1634   CHOLHDL 2.2 03/19/2019 1634   CHOLHDL 2.3 11/11/2014 1004   VLDL 19 11/11/2014 1004   LDLCALC 78 03/19/2019 1634     Imaging  I have reviewed the images obtained:  CT-head- Negative per radiology, age indeterminate left thalamocapsular infarct on attending MD read CTA Head and Neck- Pending MRI examination of the brain- Pending  Assessment:  56 y.o. female  hyperthyroidism, cocaine abuse, tobacco abuse, marijuana abuse and hypertension who has been out of her blood pressure medications for the last 1 to 2 months who is presenting today with complaint of numbness on the right side of her body, mild weakness, being off balance when she walks and difficulty with her speech. She states that she initially noticed this when she woke up yesterday morning. CT appears to show a left thalamic infarct that is age indeterminate, but suspected to be acute given it matches her symptoms well.  Will need to complete stroke work up and imaging with CTA head and neck and MRI. She has been loaded with ASA and plavix as well. Plan to slowly control blood pressure as she will be out of the window for permissive hypertension.   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 vessel disease from HTN, smoking, and cocaine use Hypertensive urgency due to noncompliance with  medications  Recommendations: - MRI Brain - CTA Head and Neck - UA and UDS pending - Load with aspirin 325mg  and plavix 300mg   - DAPT therapy with ASA 81mg  and plavix 75mg , course to be determined based on vessel imaging - Hypertensive urgency- lower blood pressure by 20 systolic each day with a goal of normotension (she is out of the window for permissive hypertension for stroke) - Echocardiogram to fully optimize her from a stroke risk perspective given risk of cocaine/hypertension induced cardiomyopathy - Recommend cessation of THC, cocaine, and cigarette usage - Normalize electrolyte derangements - Stroke team to follow Patient seen and examined by NP/APP with MD. MD to update note as needed.   Janine Ores, DNP, FNP-BC [facility] Pager: [phone]  Attending Neurologist's note:  I personally saw this patient, gathering history, performing a full neurologic examination, reviewing relevant labs, personally reviewing relevant imaging including head CT, and formulated the assessment and plan, adding the note above for completeness and clarity to accurately reflect my thoughts.  History notable for multiple vascular risk factors as well documented above.  Examination notable for sensation loss in the right face arm and leg, slurred speech, and right leg greater than right arm weakness  Lesleigh Noe MD-PhD [facility] [phone] Available 7 AM to 7 PM, outside these hours please contact Neurologist on call listed on AMION

## 2021-12-03 NOTE — H&P (Signed)
History and Physical    Patient: Ann Jarvis IOX:[id number] DOB: 31-May-1965 DOA: 12/03/2021 DOS: the patient was seen and examined on 12/03/2021 PCP: Marcine Matar, MD  Patient coming from: Home - lives alone; NOK: Daughter, Jonathon Resides, [phone]   Chief Complaint: Stroke-like symptoms  HPI: Ann Jarvis is a 56 y.o. female with medical history significant of polysubstance abuse, Grave's disease s/p RAI ablation, tobacco dependence, and HTN presenting with stroke-like symptoms.  She reports routine use of ETOH, tobacco, and cocaine.  She has not taken BP in meds in at least a month because she wasn't able to get transportation to get them.  She awoke yesterday with R-sided numbness and weakness of the face < arm/leg and this is persistent.  Mild dysarthria.    ER Course:  Acute CVA.  Not taking BP meds.  Uses cocaine, tobacco, ETOH.  Awoke 2 days ago with tingling, dropping things, off balance, speech slurred.  BP elevated.  CT negative by read, but concern for internal capsule abnormality  Needs slow BP improvement per neurology.       Review of Systems: As mentioned in the history of present illness. All other systems reviewed and are negative. Past Medical History:  Diagnosis Date   Cocaine abuse (HCC)    Grave's disease    s/p radioiodine ablation 03/05/10, F/B Dr. Chestine Spore. On Methimazole   Hypertension    Scabies    Tobacco abuse    Past Surgical History:  Procedure Laterality Date   BTL     MOUTH SURGERY     Social History:  reports that she has been smoking cigarettes. She has been smoking an average of .25 packs per day. She has never used smokeless tobacco. She reports current alcohol use of about 1.0 - 2.0 standard drink of alcohol per week. She reports current drug use. Drug: Marijuana.  Allergies  Allergen Reactions   Ciprofloxacin Rash    Significant rash, taking with Flagyl   Flagyl [Metronidazole] Rash    Developed significant rash while taking  with Cipro   Penicillins Swelling    Did it involve swelling of the face/tongue/throat, SOB, or low BP? No Did it involve sudden or severe rash/hives, skin peeling, or any reaction on the inside of your mouth or nose? No Did you need to seek medical attention at a hospital or doctor's office? No When did it last happen?  Pt states a long time ago     If all above answers are "NO", may proceed with cephalosporin use.     History reviewed. No pertinent family history.  Prior to Admission medications   Medication Sig Start Date End Date Taking? Authorizing Provider  celecoxib (CELEBREX) 200 MG capsule Take 1 capsule (200 mg total) by mouth daily. Patient not taking: Reported on 06/19/2021 10/23/20   Marcine Matar, MD  fluticasone [facility]) 50 MCG/ACT nasal spray Place 1 spray into both nostrils daily as needed for allergies or rhinitis. Patient not taking: Reported on 06/19/2021 10/23/20   Marcine Matar, MD  hydrochlorothiazide (HYDRODIURIL) 25 MG tablet Take 1 tablet (25 mg total) by mouth daily. MUST KEEP APPT 09/2021 for further refills 09/25/21   Marcine Matar, MD  labetalol (NORMODYNE) 100 MG tablet TAKE 1 TABLET (100 MG TOTAL) BY MOUTH 2 (TWO) TIMES DAILY. Patient not taking: Reported on 06/19/2021 08/21/20 08/21/21  Marcine Matar, MD  levothyroxine (SYNTHROID) 137 MCG tablet Take 1 tablet (137 mcg total) by mouth daily before breakfast. 02/27/21  Marcine Matar, MD  losartan (COZAAR) 100 MG tablet Take 1 tablet (100 mg total) by mouth daily. Must keep appt 09/2021 for further refills 09/25/21   Marcine Matar, MD  omeprazole (PRILOSEC) 20 MG capsule Take 1 capsule (20 mg total) by mouth daily. Patient not taking: Reported on 06/19/2021 08/21/20   Marcine Matar, MD    Physical Exam: Vitals:   12/03/21 1245  BP: (!) 244/117  Pulse: 64  Resp: 20  Temp: 98.3 F (36.8 C)  TempSrc: Oral  SpO2: 100%   General:  Appears calm and comfortable and is in NAD Eyes:    EOMI, normal lids, iris ENT:  grossly normal hearing, lips & tongue, mmm; edentulous Neck:  no LAD, masses or thyromegaly Cardiovascular:  RRR, no m/r/g. No LE edema.  Respiratory:   CTA bilaterally with no wheezes/rales/rhonchi.  Normal respiratory effort. Abdomen:  soft, NT, ND Skin:  no rash or induration seen on limited exam Musculoskeletal:  somewhat decreased strength of RLE > RUE, 4-5/5 Psychiatric:  blunted mood and affect, speech fluent and appropriate with ?subtle dysarthria, AOx3 Neurologic:  CN 2-12 grossly intact with ?subtle R facial droop, sensation diminished on entire R face and body   Radiological Exams on Admission: Independently reviewed - see discussion in A/P where applicable  CT HEAD WO CONTRAST  Addendum Date: 12/03/2021   ADDENDUM REPORT: 12/03/2021 17:42 ADDENDUM: Original report by Dr. Oleta Mouse. Addendum by Dr. Mosetta Putt on 12/03/2021 at 5:40 p.m.: There is a lacunar infarct involving the lateral aspect of the left thalamus which may be acute given the presenting symptoms. Neurology is aware. Electronically Signed   By: Sebastian Ache M.D.   On: 12/03/2021 17:42   Result Date: 12/03/2021 CLINICAL DATA:  Right-sided numbness, off balance and slurred speech beginning 2 days ago. EXAM: CT HEAD WITHOUT CONTRAST TECHNIQUE: Contiguous axial images were obtained from the base of the skull through the vertex without intravenous contrast. RADIATION DOSE REDUCTION: This exam was performed according to the departmental dose-optimization program which includes automated exposure control, adjustment of the mA and/or kV according to patient size and/or use of iterative reconstruction technique. COMPARISON:  03/11/2016 FINDINGS: Brain: No evidence of acute infarction, hemorrhage, hydrocephalus, extra-axial collection or mass lesion/mass effect. Vascular: No hyperdense vessel or unexpected calcification. Skull: Normal. Negative for fracture or focal lesion. Sinuses/Orbits: Globes and orbits are  unremarkable. Visualized sinuses are clear. Other: None peer IMPRESSION: Normal unenhanced CT scan of the brain. Electronically Signed: By: Amie Portland M.D. On: 12/03/2021 14:01   CT ANGIO HEAD NECK W WO CM  Result Date: 12/03/2021 CLINICAL DATA:  Stroke follow-up. Right-sided numbness, gait disturbance, and slurred speech. EXAM: CT ANGIOGRAPHY HEAD AND NECK TECHNIQUE: Multidetector CT imaging of the head and neck was performed using the standard protocol during bolus administration of intravenous contrast. Multiplanar CT image reconstructions and MIPs were obtained to evaluate the vascular anatomy. Carotid stenosis measurements (when applicable) are obtained utilizing NASCET criteria, using the distal internal carotid diameter as the denominator. RADIATION DOSE REDUCTION: This exam was performed according to the departmental dose-optimization program which includes automated exposure control, adjustment of the mA and/or kV according to patient size and/or use of iterative reconstruction technique. CONTRAST:  38mL OMNIPAQUE IOHEXOL 350 MG/ML SOLN COMPARISON:  Soft tissue neck CT 04/10/2012 FINDINGS: CTA NECK FINDINGS Aortic arch: Standard 3 vessel aortic arch with widely patent arch vessel origins. Right carotid system: Patent without evidence of stenosis, dissection, or significant atherosclerosis. Tortuous distal cervical ICA. Left  carotid system: Patent without evidence of stenosis, dissection, or significant atherosclerosis. Tortuous mid cervical ICA. Vertebral arteries: Patent with the right being dominant. Limited assessment of the left V1 segment due to refluxed venous contrast. No evidence of a significant stenosis or dissection elsewhere involving either vertebral artery. Skeleton: Edentulous.  Advanced disc degeneration at C5-6 and C6-7. Other neck: No evidence of cervical lymphadenopathy or mass. Upper chest: No apical lung consolidation or mass. Review of the MIP images confirms the above findings  CTA HEAD FINDINGS Anterior circulation: The internal carotid arteries are widely patent from skull base to carotid termini. ACAs and MCAs are patent without evidence a proximal branch occlusion or significant proximal stenosis. There is a 2-3 mm aneurysm projecting laterally from the left MCA trifurcation. Posterior circulation: The intracranial vertebral arteries are widely patent to the basilar, with the left being particularly hypoplastic distal to the PICA origin. Patent PICA and SCA origins are seen bilaterally. The basilar artery is widely patent. There are left larger than right posterior communicating arteries. The PCAs are patent with mild irregularity but no flow limiting proximal stenosis. No aneurysm is identified. Venous sinuses: As permitted by contrast timing, patent. Anatomic variants: None. Review of the MIP images confirms the above findings IMPRESSION: 1. No large vessel occlusion or significant proximal stenosis in the head and neck. 2. 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 Electronically Signed   By: Sebastian Ache M.D.   On: 12/03/2021 17:38    EKG: Independently reviewed.  Sinus bradycardia with rate 55; no evidence of acute ischemia   Labs on Admission: I have personally reviewed the available labs and imaging studies at the time of the admission.  Pertinent labs:    K+ 3.3 Glucose 123 BUN 17/Creatinine 1.05/GFR >60 - stable Normal CBC COVID/flu negative HCG <5   Assessment and Plan: Principal Problem:   Acute CVA (cerebrovascular accident) (HCC) Active Problems:   TOBACCO ABUSE   Cocaine abuse (HCC)   Essential hypertension   Hypothyroidism    Acute CVA -Patient who has been off her HTN medications for at least a month and also using tobacco/cocaine/ETOH presenting with R-sided numbness/weakness -Concerning for CVA -Symptoms started yesterday AM so she is out of the window for tPA -Aspirin has been given to reduce stroke mortality and decrease morbidity and she  was also loaded with Plavix -Will admit for further CVA evaluation -Telemetry monitoring -MRI ordered -CTA pending -Echo pending -Risk stratification with FLP, A1c -Neurology consult -PT/OT/ST/Nutrition Consults -Will start empiric Lipitor 40 mg daily  HTN -Out of the window for permissive HTN  -Her home medications have been resumed - labetalol, losartan -Resume HCTZ when appropriate   Thyroid disease -h/o Graves' disease s/p RAI ablation -Continue Synthroid -Will check TSH   Polysubstance abuse -Patient acknowledges chronic use of ETOH, cocaine -Will order CIWA with Ativan per protocol  Tobacco dependence -Encourage cessation.   -This was discussed with the patient and should be reviewed on an ongoing basis.   -Patch declined by patient  Medication non-compliance -Patient reports that she was unable to obtain transportation to get her medications -There are pharmacies like [facility] and [facility] that will deliver medications to home -[facility] team consult      Advance Care Planning:   Code Status: Full Code   Consults:  Neurology; PT/OT/ST; nutrition; TOC team  DVT Prophylaxis: Lovenox  Family Communication: None present; she declined to have me call her daughter at the time of admission  Severity of Illness: The  appropriate patient status for this patient is INPATIENT. Inpatient status is judged to be reasonable and necessary in order to provide the required intensity of service to ensure the patient's safety. The patient's presenting symptoms, physical exam findings, and initial radiographic and laboratory data in the context of their chronic comorbidities is felt to place them at high risk for further clinical deterioration. Furthermore, it is not anticipated that the patient will be medically stable for discharge from the hospital within 2 midnights of admission.   * I certify that at the point of admission it is my clinical judgment that the patient will  require inpatient hospital care spanning beyond 2 midnights from the point of admission due to high intensity of service, high risk for further deterioration and high frequency of surveillance required.*  Author: Jonah Blue, MD 12/03/2021 5:46 PM  For on call review [email or web address].

## 2021-12-03 NOTE — ED Provider Triage Note (Signed)
Emergency Medicine Provider Triage Evaluation Note  Marysol Graffam , a 56 y.o. female  was evaluated in triage.  Pt complains of right numbness and slurred speech. LKW two days ago. No hx of stroke, not on blood thinners. H/o HTN, not taking medicine. .  Review of Systems  Per HPI  Physical Exam  BP (!) 244/117 (BP Location: Right Arm)   Pulse 64   Temp 98.3 F (36.8 C) (Oral)   Resp 20   LMP 10/07/2015   SpO2 100%  Gen:   Awake, no distress   Resp:  Normal effort  MSK:   Moves extremities without difficulty  Other:  Slurred speech, right upper extremity 2/5, LUE 5/5. Subjective decreased sensation to RUE and RLE.   Medical Decision Making  Medically screening exam initiated at 12:50 PM.  Appropriate orders placed.  Tylyn Emrich was informed that the remainder of the evaluation will be completed by another provider, this initial triage assessment does not replace that evaluation, and the importance of remaining in the ED until their evaluation is complete.  Outside of window of code stroke.    Theron Arista, PA-C 12/03/21 1251

## 2021-12-03 NOTE — ED Triage Notes (Signed)
Patient here with stroke like symptoms, complains of R sided numbness, unbalanced gait, slurred speech that started 2 two days ago.

## 2021-12-03 NOTE — ED Provider Notes (Signed)
[facility] EMERGENCY DEPARTMENT Provider Note   CSN: [id number] Arrival date & time: 12/03/21  1240     History  No chief complaint on file.   Ann Jarvis is a 56 y.o. female.  Patient is a 56 year old female with a history of hyperthyroidism, cocaine abuse, tobacco abuse, marijuana abuse and hypertension who has been out of her blood pressure medications for the last 1 to 2 months who is presenting today with complaint of numbness on the right side of her body, mild weakness, being off balance when she walks and difficulty with her speech.  She denies headache, neck pain, nausea or vomiting.  She denies any visual changes.  Patient reports all of these symptoms started 2 days ago and they have not resolved.  She denies symptoms similar to this in the past.  The history is provided by the patient and medical records.       Home Medications Prior to Admission medications   Medication Sig Start Date End Date Taking? Authorizing Provider  celecoxib (CELEBREX) 200 MG capsule Take 1 capsule (200 mg total) by mouth daily. Patient not taking: Reported on 06/19/2021 10/23/20   Marcine Matar, MD  fluticasone [facility]) 50 MCG/ACT nasal spray Place 1 spray into both nostrils daily as needed for allergies or rhinitis. Patient not taking: Reported on 06/19/2021 10/23/20   Marcine Matar, MD  hydrochlorothiazide (HYDRODIURIL) 25 MG tablet Take 1 tablet (25 mg total) by mouth daily. MUST KEEP APPT 09/2021 for further refills 09/25/21   Marcine Matar, MD  labetalol (NORMODYNE) 100 MG tablet TAKE 1 TABLET (100 MG TOTAL) BY MOUTH 2 (TWO) TIMES DAILY. Patient not taking: Reported on 06/19/2021 08/21/20 08/21/21  Marcine Matar, MD  levothyroxine (SYNTHROID) 137 MCG tablet Take 1 tablet (137 mcg total) by mouth daily before breakfast. 02/27/21   Marcine Matar, MD  losartan (COZAAR) 100 MG tablet Take 1 tablet (100 mg total) by mouth daily. Must keep appt 09/2021 for  further refills 09/25/21   Marcine Matar, MD  omeprazole (PRILOSEC) 20 MG capsule Take 1 capsule (20 mg total) by mouth daily. Patient not taking: Reported on 06/19/2021 08/21/20   Marcine Matar, MD      Allergies    Ciprofloxacin, Flagyl [metronidazole], and Penicillins    Review of Systems   Review of Systems  Physical Exam Updated Vital Signs BP (!) 244/117 (BP Location: Right Arm)   Pulse 64   Temp 98.3 F (36.8 C) (Oral)   Resp 20   LMP 10/07/2015   SpO2 100%  Physical Exam Vitals and nursing note reviewed.  Constitutional:      General: She is not in acute distress.    Appearance: She is well-developed.  HENT:     Head: Normocephalic and atraumatic.  Eyes:     Pupils: Pupils are equal, round, and reactive to light.  Cardiovascular:     Rate and Rhythm: Normal rate and regular rhythm.     Heart sounds: Normal heart sounds. No murmur heard.    No friction rub.  Pulmonary:     Effort: Pulmonary effort is normal.     Breath sounds: Normal breath sounds. No wheezing or rales.  Abdominal:     General: Bowel sounds are normal. There is no distension.     Palpations: Abdomen is soft.     Tenderness: There is no abdominal tenderness. There is no guarding or rebound.  Musculoskeletal:  General: No tenderness. Normal range of motion.     Comments: No edema  Skin:    General: Skin is warm and dry.     Findings: No rash.  Neurological:     Mental Status: She is alert and oriented to person, place, and time.     Cranial Nerves: No cranial nerve deficit.     Coordination: Finger-Nose-Finger Test and Heel to [place] Test normal.     Comments: Mild pronator drift noted in the right upper and lower extremity.  Decreased sensation in the right face, upper and lower extremity.  Mild slurred speech.  Right-sided facial droop.  5 out of 5 strength in all 4 extremities.  Psychiatric:        Mood and Affect: Mood normal.        Behavior: Behavior normal.     ED  Results / Procedures / Treatments   Labs (all labs ordered are listed, but only abnormal results are displayed) Labs Reviewed  COMPREHENSIVE METABOLIC PANEL - Abnormal; Notable for the following components:      Result Value   Potassium 3.3 (*)    Glucose, Bld 123 (*)    Creatinine, Ser 1.05 (*)    Calcium 8.7 (*)    Albumin 3.0 (*)    All other components within normal limits  I-STAT CHEM 8, ED - Abnormal; Notable for the following components:   Potassium 3.1 (*)    Glucose, Bld 118 (*)    Calcium, Ion 1.13 (*)    All other components within normal limits  RESP PANEL BY RT-PCR (FLU A&B, COVID) ARPGX2  PROTIME-INR  APTT  CBC  DIFFERENTIAL  ETHANOL  RAPID URINE DRUG SCREEN, HOSP PERFORMED  URINALYSIS, ROUTINE W REFLEX MICROSCOPIC  I-STAT BETA HCG BLOOD, ED ([facility], [facility], [facility] ONLY)    EKG EKG Interpretation  Date/Time:  Monday December 03 2021 12:55:09 EDT Ventricular Rate:  55 PR Interval:  184 QRS Duration: 94 QT Interval:  498 QTC Calculation: 476 R Axis:   12 Text Interpretation: Sinus bradycardia Biatrial enlargement Cannot rule out Anterior infarct , age undetermined No significant change since last tracing When compared with ECG of 25-Feb-2020 13:41, PREVIOUS ECG IS PRESENT Confirmed by Gwyneth Sprout ([id number]) on 12/03/2021 2:42:01 PM  Radiology CT HEAD WO CONTRAST  Result Date: 12/03/2021 CLINICAL DATA:  Right-sided numbness, off balance and slurred speech beginning 2 days ago. EXAM: CT HEAD WITHOUT CONTRAST TECHNIQUE: Contiguous axial images were obtained from the base of the skull through the vertex without intravenous contrast. RADIATION DOSE REDUCTION: This exam was performed according to the departmental dose-optimization program which includes automated exposure control, adjustment of the mA and/or kV according to patient size and/or use of iterative reconstruction technique. COMPARISON:  03/11/2016 FINDINGS: Brain: No evidence of acute infarction, hemorrhage,  hydrocephalus, extra-axial collection or mass lesion/mass effect. Vascular: No hyperdense vessel or unexpected calcification. Skull: Normal. Negative for fracture or focal lesion. Sinuses/Orbits: Globes and orbits are unremarkable. Visualized sinuses are clear. Other: None peer IMPRESSION: Normal unenhanced CT scan of the brain. Electronically Signed   By: Amie Portland M.D.   On: 12/03/2021 14:01    Procedures Procedures    Medications Ordered in ED Medications  losartan (COZAAR) tablet 100 mg (has no administration in time range)  labetalol (NORMODYNE) injection 10 mg (has no administration in time range)    ED Course/ Medical Decision Making/ A&P  Medical Decision Making Amount and/or Complexity of Data Reviewed External Data Reviewed: notes. Labs: ordered. Decision-making details documented in ED Course. Radiology: ordered and independent interpretation performed. Decision-making details documented in ED Course. ECG/medicine tests: ordered and independent interpretation performed. Decision-making details documented in ED Course.  Risk Prescription drug management. Decision regarding hospitalization.   Pt with multiple medical problems and comorbidities and presenting today with a complaint that caries a high risk for morbidity and mortality.  Here today with symptoms most concerning for a stroke.  She is having decreased sensation as well as pronator drift in the right upper and lower extremity.  She has mild slurred speech.  She is noted to be hypertensive today at 244/117 with a history of being out of her blood pressure medications for the last 2 months.  Patient does not have headache she is awake and alert with no altered mental status.  Concern today for stroke.  Lower suspicion for intracranial hemorrhage or substance intoxication.  Also possibility for pres but she has no altered mental status.  I independently interpreted patient's labs and EKG today.   CBC, CMP, coags and COVID are all within normal limits.  I have independently visualized and interpreted pt's images today.  Head CT without evidence of intracranial hemorrhage today.  Radiology reports negative.  Patient is not in the window for TNK as her symptoms have been persistent for 2 days.  However will discuss with neurology.  Patient may need permissive hypertension but will discuss with them whether she should be administered her home medication that she has been out of.  Also patient will need an MRI.  Feel that patient will need admission for stroke work-up.  3:09 PM Discussed with Dr. Iver Nestle with neurology and pt will get CTA of head and neck and MRI.  Pt will be given her cozaar and labetalol but will attempt to lower blood pressure slowly.  Will admit for stroke work up to unassigned medicine.  Neurology feels like they see stroke in the internal capsule.  CRITICAL CARE Performed by: Jkwon Treptow Total critical care time: 30 minutes Critical care time was exclusive of separately billable procedures and treating other patients. Critical care was necessary to treat or prevent imminent or life-threatening deterioration. Critical care was time spent personally by me on the following activities: development of treatment plan with patient and/or surrogate as well as nursing, discussions with consultants, evaluation of patient's response to treatment, examination of patient, obtaining history from patient or surrogate, ordering and performing treatments and interventions, ordering and review of laboratory studies, ordering and review of radiographic studies, pulse oximetry and re-evaluation of patient's condition.         Final Clinical Impression(s) / ED Diagnoses Final diagnoses:  Cerebrovascular accident (CVA), unspecified mechanism (HCC)  Hypertensive urgency    Rx / DC Orders ED Discharge Orders     None         Gwyneth Sprout, MD 12/03/21 [phone]

## 2021-12-04 ENCOUNTER — Encounter (HOSPITAL_COMMUNITY): Payer: Self-pay | Admitting: Internal Medicine

## 2021-12-04 ENCOUNTER — Inpatient Hospital Stay (HOSPITAL_COMMUNITY): Payer: Medicaid Other

## 2021-12-04 DIAGNOSIS — F1410 Cocaine abuse, uncomplicated: Secondary | ICD-10-CM

## 2021-12-04 DIAGNOSIS — I639 Cerebral infarction, unspecified: Secondary | ICD-10-CM | POA: Diagnosis not present

## 2021-12-04 DIAGNOSIS — I6389 Other cerebral infarction: Secondary | ICD-10-CM

## 2021-12-04 LAB — HEMOGLOBIN A1C
Hgb A1c MFr Bld: 4.8 % (ref 4.8–5.6)
Mean Plasma Glucose: 91.06 mg/dL

## 2021-12-04 LAB — LIPID PANEL
Cholesterol: 153 mg/dL (ref 0–200)
HDL: 61 mg/dL (ref >40)
LDL Cholesterol: 80 mg/dL (ref 0–99)
Total CHOL/HDL Ratio: 2.5 RATIO
Triglycerides: 62 mg/dL (ref <150)
VLDL: 12 mg/dL (ref 0–40)

## 2021-12-04 LAB — ECHOCARDIOGRAM COMPLETE
Area-P 1/2: 2.03 cm2
MV M vel: 2.38 m/s
MV Peak grad: 22.7 mmHg
S' Lateral: 2.7 cm

## 2021-12-04 LAB — ETHANOL: Alcohol, Ethyl (B): <10 mg/dL (ref <10)

## 2021-12-04 LAB — TSH: TSH: 7.103 u[IU]/mL — ABNORMAL HIGH (ref 0.350–4.500)

## 2021-12-04 LAB — HIV ANTIBODY (ROUTINE TESTING W REFLEX): HIV Screen 4th Generation wRfx: NONREACTIVE

## 2021-12-04 MED ORDER — POTASSIUM CHLORIDE CRYS ER 20 MEQ PO TBCR
40.0000 meq | EXTENDED_RELEASE_TABLET | Freq: Two times a day (BID) | ORAL | Status: DC
  Administered 2021-12-04 – 2021-12-05 (×3): 40 meq via ORAL
  Filled 2021-12-04 (×3): qty 2

## 2021-12-04 MED ORDER — LABETALOL HCL 200 MG PO TABS
100.0000 mg | ORAL_TABLET | Freq: Two times a day (BID) | ORAL | Status: DC
  Administered 2021-12-04: 100 mg via ORAL
  Filled 2021-12-04: qty 1

## 2021-12-04 MED ORDER — LABETALOL HCL 200 MG PO TABS
200.0000 mg | ORAL_TABLET | Freq: Two times a day (BID) | ORAL | Status: DC
  Administered 2021-12-04 – 2021-12-05 (×2): 200 mg via ORAL
  Filled 2021-12-04 (×2): qty 1

## 2021-12-04 MED ORDER — HYDROCHLOROTHIAZIDE 12.5 MG PO TABS
12.5000 mg | ORAL_TABLET | Freq: Every day | ORAL | Status: DC
  Administered 2021-12-04: 12.5 mg via ORAL
  Filled 2021-12-04: qty 1

## 2021-12-04 NOTE — Progress Notes (Addendum)
STROKE TEAM PROGRESS NOTE   ATTENDING NOTE: I reviewed above note and agree with the assessment and plan. Pt was seen and examined.   56 year old female with history of hypertension, cocaine abuse, smoker, THC abuse, alcohol abuse admitted for right-sided numbness and weakness, imbalance and slurred speech.  CT showed left thalamic subacute infarct.  CT head and neck unremarkable.  MRI showed left thalamic infarct.  EF 55 to 60%.  LDL 80, A1c 4.8, UDS positive for cocaine.  Creatinine 1.00.  BP elevated.  On exam, OT at bedside working with patient.  Patient sitting at edge of bed, AAOx3, no aphasia, follows simple commands, no significant dysarthria, able to name and repeat.  Visual fields full, no gaze palsy, no significant facial asymmetry.  Tongue midline.  Bilateral upper extremity 5/5, however left upper extremity clumsy on finger-to-nose test with mild dysmetria.  Left lower extremity 5/5, right lower extremity 4/5, difficulty with heel-to-shin test.  Sensation decreased on the right.  Gait not tested.  Etiology for patient lacunar stroke likely due to small vessel disease given multiple uncontrolled risk factors.  Patient BP still on the higher end, outside of permissive hypertension window, will gradually normalize BP.  Currently on Cozaar 100, HCTZ 12.5 and labetalol 200 twice daily.  Aggressive risk factor modification.  On aspirin and Plavix DAPT for 3 weeks and then aspirin alone.  Continue Lipitor 40.  Cocaine, tobacco and THC cessation education provided, alcohol limitation education also provided.  Patient willing to quit.  We will follow-up further BP management.  For detailed assessment and plan, please refer to above/below as I have made changes wherever appropriate.   Rosalin Hawking, MD PhD Stroke Neurology 12/04/2021 5:39 PM    INTERVAL HISTORY There is no family member @ the bedside, Patient is in ED and states speech has improved but still has numbness of her right arm and hand.  RN reporting that she has had elevated blood pressure through out the night . She is eating well and has been oob to bathroom Echo tech @ the bedside.  No c/o headache, no speech difficulty, no swallowing diffculty, no dizziness, syncope or new weakness  Vitals:   12/04/21 0530 12/04/21 0600 12/04/21 0743 12/04/21 1117  BP: (!) 190/86 (!) 193/95 (!) 175/81 (!) 188/90  Pulse: 65  62 (!) 54  Resp: 18  16 16   Temp:   98.1 F (36.7 C) 97.9 F (36.6 C)  TempSrc:   Oral Oral  SpO2: 100%  100% 99%   CBC:  Recent Labs  Lab 12/03/21 1325 12/03/21 1411  WBC 5.1  --   NEUTROABS 3.2  --   HGB 12.7 13.6  HCT 40.1 40.0  MCV 91.3  --   PLT 234  --    Basic Metabolic Panel:  Recent Labs  Lab 12/03/21 1325 12/03/21 1411  NA 139 141  K 3.3* 3.1*  CL 105 102  CO2 27  --   GLUCOSE 123* 118*  BUN 17 18  CREATININE 1.05* 1.00  CALCIUM 8.7*  --    Lipid Panel:  Recent Labs  Lab 12/04/21 0433  CHOL 153  TRIG 62  HDL 61  CHOLHDL 2.5  VLDL 12  LDLCALC 80   HgbA1c:  Recent Labs  Lab 12/04/21 0433  HGBA1C 4.8   Urine Drug Screen:  Recent Labs  Lab 12/03/21 1715  LABOPIA NONE DETECTED  COCAINSCRNUR POSITIVE*  LABBENZ NONE DETECTED  AMPHETMU NONE DETECTED  THCU NONE DETECTED  LABBARB NONE DETECTED  Alcohol Level  Recent Labs  Lab 12/04/21 0433  ETH <10    IMAGING past 24 hours ECHOCARDIOGRAM COMPLETE  Result Date: 12/04/2021    ECHOCARDIOGRAM REPORT   Patient Name:   Ann Jarvis Date of Exam: 12/04/2021 Medical Rec #:  [id number]        Height:       71.0 in Accession #:    [id number]       Weight:       142.4 lb Date of Birth:  [birth year]       BSA:          1.825 m Patient Age:    46 years         BP:           180/92 mmHg Patient Gender: F                HR:           55 bpm. Exam Location:  Inpatient Procedure: 2D Echo, Cardiac Doppler and Color Doppler Indications:    Stroke  History:        Patient has prior history of Echocardiogram examinations, most                  recent 03/30/2019. Risk Factors:Hypertension and Current Smoker.                 Polysubstance abuse, Graves disease.  Sonographer:    Eartha Inch Referring Phys: Lorenza Chick  Sonographer Comments: Image acquisition challenging due to respiratory motion and Image acquisition challenging due to patient body habitus. IMPRESSIONS  1. Left ventricular ejection fraction, by estimation, is 55 to 60%. The left ventricle has normal function. The left ventricle has no regional wall motion abnormalities. There is severe concentric left ventricular hypertrophy. Left ventricular diastolic  parameters are consistent with Grade II diastolic dysfunction (pseudonormalization). Elevated left atrial pressure.  2. Right ventricular systolic function is normal. The right ventricular size is normal. There is normal pulmonary artery systolic pressure.  3. Left atrial size was mildly dilated.  4. The mitral valve is normal in structure. Mild mitral valve regurgitation.  5. The aortic valve is tricuspid. Aortic valve regurgitation is trivial. No aortic stenosis is present. Comparison(s): A prior study was performed on 03/30/2019 [facility]. Prior images unable to be directly viewed, comparison made by report only. Conclusion(s)/Recommendation(s): Findings consistent with hypertrophic cardiomyopathy. No intracardiac source of embolism detected on this transthoracic study. Consider a transesophageal echocardiogram to exclude cardiac source of embolism if clinically indicated. FINDINGS  Left Ventricle: Left ventricular ejection fraction, by estimation, is 55 to 60%. The left ventricle has normal function. The left ventricle has no regional wall motion abnormalities. The left ventricular internal cavity size was normal in size. There is  severe concentric left ventricular hypertrophy. Left ventricular diastolic parameters are consistent with Grade II diastolic dysfunction (pseudonormalization). Elevated left atrial  pressure. Right Ventricle: The right ventricular size is normal. No increase in right ventricular wall thickness. Right ventricular systolic function is normal. There is normal pulmonary artery systolic pressure. The tricuspid regurgitant velocity is 1.53 m/s, and  with an assumed right atrial pressure of 3 mmHg, the estimated right ventricular systolic pressure is 0000000 mmHg. Left [facility]: Left atrial size was mildly dilated. Right [facility]: Right atrial size was normal in size. Pericardium: There is no evidence of pericardial effusion. Mitral Valve: The mitral valve is normal in structure. Mild mitral valve regurgitation. Tricuspid Valve: The tricuspid valve  is grossly normal. Tricuspid valve regurgitation is mild. Aortic Valve: The aortic valve is tricuspid. Aortic valve regurgitation is trivial. No aortic stenosis is present. Pulmonic Valve: The pulmonic valve was normal in structure. Pulmonic valve regurgitation is not visualized. Aorta: The aortic root is normal in size and structure. IAS/Shunts: The atrial septum is grossly normal.  LEFT VENTRICLE PLAX 2D LVIDd:         4.20 cm   Diastology LVIDs:         2.70 cm   LV e' medial:    4.35 cm/s LV PW:         1.70 cm   LV E/e' medial:  17.4 LV IVS:        2.00 cm   LV e' lateral:   3.21 cm/s LVOT diam:     2.10 cm   LV E/e' lateral: 23.5 LV SV:         66 LV SV Index:   36 LVOT Area:     3.46 cm  RIGHT VENTRICLE             IVC RV S prime:     12.40 cm/s  IVC diam: 1.10 cm TAPSE (M-mode): 2.1 cm LEFT ATRIUM             Index        RIGHT ATRIUM           Index LA diam:        3.50 cm 1.92 cm/m   RA Area:     15.10 cm LA Vol (A2C):   22.9 ml 12.55 ml/m  RA Volume:   37.00 ml  20.27 ml/m LA Vol (A4C):   26.2 ml 14.36 ml/m LA Biplane Vol: 25.4 ml 13.92 ml/m  AORTIC VALVE LVOT Vmax:   88.20 cm/s LVOT Vmean:  62.000 cm/s LVOT VTI:    0.191 m  AORTA Ao Root diam: 2.50 cm MITRAL VALVE               TRICUSPID VALVE MV Area (PHT): 2.03 cm    TR Peak grad:   9.4 mmHg  MV Decel Time: 374 msec    TR Vmax:        153.00 cm/s MR Peak grad: 22.7 mmHg MR Vmax:      238.00 cm/s  SHUNTS MV E velocity: 75.50 cm/s  Systemic VTI:  0.19 m MV A velocity: 50.10 cm/s  Systemic Diam: 2.10 cm MV E/A ratio:  1.51 Mihai Croitoru MD Electronically signed by Thurmon Fair MD Signature Date/Time: 12/04/2021/1:24:34 PM    Final    MR BRAIN WO CONTRAST  Result Date: 12/03/2021 CLINICAL DATA:  Neuro deficit, acute, stroke suspected. Right-sided numbness. EXAM: MRI HEAD WITHOUT CONTRAST TECHNIQUE: Multiplanar, multiecho pulse sequences of the brain and surrounding structures were obtained without intravenous contrast. COMPARISON:  Head CT and CTA 12/03/2021 FINDINGS: Brain: There is a 1 cm acute infarct in the left thalamus. Chronic microhemorrhages are noted in the more dorsal aspect of the left thalamus and in the right lentiform nucleus. Small T2 hyperintensities scattered throughout the cerebral white matter bilaterally are nonspecific but compatible with mildly to moderately age advanced chronic small vessel ischemic disease. There is a chronic lacunar infarct in the left caudate head. The ventricles and sulci are normal. No mass, midline shift, or extra-axial fluid collection is identified. Vascular: Major intracranial vascular flow voids are preserved. Skull and upper cervical spine: Unremarkable bone marrow signal. Sinuses/Orbits: Unremarkable orbits. Small volume secretions in a  right ethmoid air cell. Trace right mastoid fluid. Other: None. IMPRESSION: 1. Acute left thalamic infarct. 2. Mildly to moderately age advanced chronic small vessel ischemic disease. Electronically Signed   By: Sebastian Ache M.D.   On: 12/03/2021 19:49   CT ANGIO HEAD NECK W WO CM  Result Date: 12/03/2021 CLINICAL DATA:  Stroke follow-up. Right-sided numbness, gait disturbance, and slurred speech. EXAM: CT ANGIOGRAPHY HEAD AND NECK TECHNIQUE: Multidetector CT imaging of the head and neck was performed using the  standard protocol during bolus administration of intravenous contrast. Multiplanar CT image reconstructions and MIPs were obtained to evaluate the vascular anatomy. Carotid stenosis measurements (when applicable) are obtained utilizing NASCET criteria, using the distal internal carotid diameter as the denominator. RADIATION DOSE REDUCTION: This exam was performed according to the departmental dose-optimization program which includes automated exposure control, adjustment of the mA and/or kV according to patient size and/or use of iterative reconstruction technique. CONTRAST:  48mL OMNIPAQUE IOHEXOL 350 MG/ML SOLN COMPARISON:  Soft tissue neck CT 04/10/2012 FINDINGS: CTA NECK FINDINGS Aortic arch: Standard 3 vessel aortic arch with widely patent arch vessel origins. Right carotid system: Patent without evidence of stenosis, dissection, or significant atherosclerosis. Tortuous distal cervical ICA. Left carotid system: Patent without evidence of stenosis, dissection, or significant atherosclerosis. Tortuous mid cervical ICA. Vertebral arteries: Patent with the right being dominant. Limited assessment of the left V1 segment due to refluxed venous contrast. No evidence of a significant stenosis or dissection elsewhere involving either vertebral artery. Skeleton: Edentulous.  Advanced disc degeneration at C5-6 and C6-7. Other neck: No evidence of cervical lymphadenopathy or mass. Upper chest: No apical lung consolidation or mass. Review of the MIP images confirms the above findings CTA HEAD FINDINGS Anterior circulation: The internal carotid arteries are widely patent from skull base to carotid termini. ACAs and MCAs are patent without evidence a proximal branch occlusion or significant proximal stenosis. There is a 2-3 mm aneurysm projecting laterally from the left MCA trifurcation. Posterior circulation: The intracranial vertebral arteries are widely patent to the basilar, with the left being particularly hypoplastic  distal to the PICA origin. Patent PICA and SCA origins are seen bilaterally. The basilar artery is widely patent. There are left larger than right posterior communicating arteries. The PCAs are patent with mild irregularity but no flow limiting proximal stenosis. No aneurysm is identified. Venous sinuses: As permitted by contrast timing, patent. Anatomic variants: None. Review of the MIP images confirms the above findings IMPRESSION: 1. No large vessel occlusion or significant proximal stenosis in the head and neck. 2. 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 Electronically Signed   By: Sebastian Ache M.D.   On: 12/03/2021 17:38    Echocardiogram: IMPRESSIONS     1. Left ventricular ejection fraction, by estimation, is 55 to 60%. The  left ventricle has normal function. The left ventricle has no regional  wall motion abnormalities. There is severe concentric left ventricular  hypertrophy. Left ventricular diastolic   parameters are consistent with Grade II diastolic dysfunction  (pseudonormalization). Elevated left atrial pressure.   2. Right ventricular systolic function is normal. The right ventricular  size is normal. There is normal pulmonary artery systolic pressure.   3. Left atrial size was mildly dilated.   4. The mitral valve is normal in structure. Mild mitral valve  regurgitation.   5. The aortic valve is tricuspid. Aortic valve regurgitation is trivial.  No aortic stenosis is present.    PHYSICAL EXAM General: Awake, Alert & Oriented, cooperative,  pleasant and NAD Psych: Mood is stable  HEENT: - Normocephalic and atraumatic, dry mm, no LN++, no Thyromegally LUNGS - Clear to auscultation bilaterally with no wheezes CV - S1S2 RRR, no m/r/g, equal pulses bilaterally. ABDOMEN - Soft, nontender, nondistended with normoactive BS Ext: warm, well perfused, intact peripheral pulses, no edema  Neuro Exam: Mental Status: Awake, Alert & Oriented to self, place and year. She did state year  as 2023, month as July and unable to recite correct date. She knows the reasoning for this admission. Speech: slightly Dysarthric Language: naming,repetition, fluency and comprehension is intact CN II-XII: PERRL 3 mm reactive bilateral,no facial asymmetry, visual fields are grossly intact, positive right  facial sensation diminished, tongue/soft palate/uvula is midline. No evidence of tongue atrophy or fasciculations Motor Strength: Positive slight RUE Drift , No tremors observed RUE 4+/5     RLE 4/5  LUE  5/5       LLE 5/5  Tone is normal Sensation: Decreased on RUE/hemibody /RLE. Tone:Normal Coordination: FTN and HTS is intact bilateral. No Ataxia  Gait: Deferred    ASSESSMENT/PLAN [name] is a 56 y.o. female with history of polysubstance abuse including  marijuana,cocaine, tobacco abuse,hypertension with RX non compliance.,states ran out of medication 2 months ago. Patient also with history of graves disease, s/p RAI ablation, HTN, HDL presenting with  numbness on right side of her body with mild weakness, speech difficulty that was dysarthric and balance problems with ambulation. Patient states she woke up on the morning of her admission with right sided numbness, She thought it would improve.There was no improvement within several days  and she opted to be evaluated @ [facility]. LKW was on 12/01/21 and she was not a TNK candidate Her imaging studies included CT HEAD (non-con)  which revealed age indeterminate left thalamocapsular infarct. CTA Head/Neck shows no large vessel occlusion or significant proximal stenosis.  Patient admitted for observation and further stroke workup. MRI Brain w/ contrast obtained yesterday reveals Acute left thalamic infarct. Patient was loaded with Plavix & ASA (DAPT). Her stroke etiology is most likely due to small vessel disease due to vascular stroke risk factors to include cocaine use, tobacco use and poorly controlled hypertension.   Ischemic Stroke  : Acute Left Thalamic Stroke Etiology:  Small Vessel Disease   Neuro checks q 4 hours DAPT: Patient was loaded with Plavix 300 mg x1 + ASA 325 mg qday Continue with ASA 81 mg qday + Plavix 75 mg qday   Echocardiogram completed with EF 55-60% Statin Therapy: Continue Lipitor 40 mg qhs. LDL goal < 70 PT/OT/SLT Consultation Calculated LDL 80 HgbA1c 4.8 % VTE prophylaxis - Lovenox    Diet   Diet Heart Room service appropriate? Yes; Fluid consistency: Thin    Hypertension Poorly controled hypertension 2/2 medical non compliance Patients stroke symptoms started on 12/01/21 and # this time is out of the window for permissive hypertension and her outpatient medications can be reintroduced.This am will start labetolo 100 mg po q 12 hours. Primary Team managing antihypertensive medications  Long-term BP goal normotensive. Also patient has been non compliant due to cost of some medications. Primary team asked for TOC assitance  Hyperlipidemia Home meds: Restart Lipitor 40 mg qhs resumed in hospital LDL 80, goal < 70 Continue statin at discharge  Polysubstance Abuse: This s ongoing with patient . She has been using cocaine, marijuana and alcohol Discussed and educated patient not to use multiple substance and will need councelling/Education to stop/quit illicit drugs  Other Stroke Risk Factors Advanced Age >/= 63  Cigarette smokeradvised to stop smoking ETOH use, alcohol . Substance abuse - UDS:  THC NONE DETECTED, Cocaine POSITIVE. Patient advised to stop using due to stroke risk.  Hypertension Medical non-compliance       Hospital day # 1  [place] Gamma , [place]  To contact Stroke Continuity provider, please refer to [email or web address] After hours, contact General Neurology

## 2021-12-04 NOTE — Progress Notes (Signed)
  Echocardiogram 2D Echocardiogram has been performed.  Ann Jarvis 12/04/2021, 12:17 PM

## 2021-12-04 NOTE — Evaluation (Signed)
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 Chestine Spore. On Methimazole   Hypertension    Scabies    Tobacco abuse    Past Surgical History:  Past Surgical History:  Procedure Laterality Date   BTL     MOUTH SURGERY     HPI:  Pt is a 56 y/o female admitted secondary to R numbness, and unsteadiness. Found to have L thalamic infarct. PMH includes tobacco abuse, cocaine abuse, graves disease and HTN.   Assessment / Plan / Recommendation Clinical Impression  Pt presents with normal expressive/receptive language with preserved ability to follow complex commands, preserved naming, fluent output with speech impacted only by lack of dentition.  She describes ongoing numbness right face. No f/u SLP is recommended. Pt agrees. Our service will sign off.    SLP Assessment  SLP Recommendation/Assessment: Patient does not need any further Speech Lanaguage Pathology Services SLP Visit Diagnosis: Aphasia (R47.01)    Recommendations for follow up therapy are one component of a multi-disciplinary discharge planning process, led by the attending physician.  Recommendations may be updated based on patient status, additional  functional criteria and insurance authorization.    Follow Up Recommendations  No SLP follow up                       SLP Evaluation Cognition  Overall Cognitive Status: Within Functional Limits for tasks assessed Arousal/Alertness: Awake/alert Orientation Level: Oriented X4 Attention: Alternating Alternating Attention: Appears intact       Comprehension  Auditory Comprehension Overall Auditory Comprehension: Appears within functional limits for tasks assessed Yes/No Questions: Within Functional Limits Commands: Within Functional Limits Visual Recognition/Discrimination Discrimination: Within Function Limits Reading Comprehension Reading Status: Within funtional limits    Expression Expression Primary Mode of Expression: Verbal Verbal Expression Overall Verbal Expression: Appears within functional limits for tasks assessed Initiation: No impairment Automatic Speech: Name;Social Response;Counting Level of Generative/Spontaneous Verbalization: Conversation Repetition: No impairment Naming: No impairment Written Expression Dominant Hand: Right   Oral / Motor  Oral Motor/Sensory Function Overall Oral Motor/Sensory Function: Mild impairment Facial ROM: Within Functional Limits Facial Symmetry: Within Functional Limits Facial Sensation: Reduced right Lingual Strength: Within Functional Limits Motor Speech Overall Motor Speech: Appears within functional limits for tasks assessed            Blenda Mounts Laurice 12/04/2021, 5:16 PM Marchelle Folks L. Samson Frederic, MA CCC/SLP Clinical Specialist - Acute Care SLP Acute Rehabilitation Services Office number [phone]

## 2021-12-04 NOTE — ED Notes (Signed)
Pt in room A&O x4 and ambulatory. NIHSS -0 at this time. Pt denies any pain. Swallowed pills without difficulty. Updated on plan of care

## 2021-12-04 NOTE — Progress Notes (Addendum)
Ann Jarvis  DUK:[id number] DOB: 07-05-1965 DOA: 12/03/2021 PCP: Marcine Matar, MD    Brief Narrative:  56 year old with a history of polysubstance abuse, Graves' disease status post RAI ablation, tobacco abuse, and HTN who presented to the ER with strokelike symptoms amidst the routine use of alcohol, tobacco, and cocaine.  She admitted to not taking her blood pressure medications for at least a month.  She awoke the day before presentation with right-sided numbness and weakness of the face and to a lesser extent her right arm and leg, and when her symptoms persisted for 24 hours she decided to present to the ER.  Consultants:  Neurology  Goals of Care:  Code Status: Full Code   DVT prophylaxis: Lovenox  Interim Hx: Afebrile.  Blood pressure remains elevated.  Vital signs otherwise stable. No new complaints. Denies cp or sob.   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 LDL 80 A1c 4.8 TTE pending UDS + for cocaine PT/OT evaluations  HTN Now out of the window for permissive hypertension therefore will titrate medications for appropriate BP control - need to assure meds chosen can be obtained/are affordable - will ask TOC to assist, and plan to prescribe via the [facility] at time of d/c - avoid non-selective BB use, but labetalol should be safe   Hypokalemia Likely due to poor nutritional intake -supplement and follow  Thyroid disease -Graves' status post RAI ablation Continue Synthroid -TSH 7.1 likely reflective of noncompliance  Ongoing polysubstance abuse -cocaine,  alcohol, tobacco Counseled on absolute need to avoid all illicit substances of abuse  Noncompliance with medications TOC to assist patient in obtaining meds as outpatient and to establish her with a [facility] operated clinic for follow-up  Family Communication: No family present at time of exam Disposition: Anticipate discharge 9/6 if outpatient clinic and medication issues can be addressed and if blood pressure control improved   Objective: Blood pressure (!) 175/81, pulse 62, temperature 98.1 F (36.7 C), temperature source Oral, resp. rate 16, last menstrual period 10/07/2015, SpO2 100 %. No intake or output data in the 24 hours ending 12/04/21 0813 There were no vitals filed for this visit.  Examination: General: No acute respiratory distress Lungs: Clear to auscultation bilaterally without wheezes or crackles Cardiovascular: Regular rate and rhythm without murmur gallop or rub normal S1 and S2 Abdomen: Nontender, nondistended, soft, bowel sounds positive, no rebound, no ascites, no appreciable mass Extremities: No significant cyanosis, clubbing, or edema bilateral lower extremities  CBC: Recent Labs  Lab 12/03/21 1325 12/03/21 1411  WBC 5.1  --   NEUTROABS 3.2  --   HGB 12.7 13.6  HCT 40.1 40.0  MCV 91.3  --   PLT 234  --    Basic Metabolic Panel: Recent Labs  Lab 12/03/21 1325 12/03/21 1411  NA 139 141  K 3.3* 3.1*  CL 105 102  CO2 27  --   GLUCOSE 123* 118*  BUN 17 18  CREATININE 1.05* 1.00  CALCIUM 8.7*  --    GFR: CrCl cannot be calculated (Unknown ideal weight.).  Scheduled Meds:   stroke: early  stages of recovery book   Does not apply Once   aspirin EC  81 mg Oral Daily   atorvastatin  40 mg Oral Daily   clopidogrel  75 mg Oral Daily   enoxaparin (LOVENOX) injection  40 mg Subcutaneous Q24H   folic acid  1 mg Oral Daily   levothyroxine  137 mcg Oral QAC breakfast   losartan  100 mg Oral Daily   multivitamin with minerals  1 tablet Oral Daily    thiamine  100 mg Oral Daily   Or   thiamine  100 mg Intravenous Daily     LOS: 1 day   Lonia Blood, MD [facility] Office  [phone] Pager - Text Page per Loretha Stapler  If 7PM-7AM, please contact night-coverage per Amion 12/04/2021, 8:13 AM

## 2021-12-04 NOTE — Evaluation (Signed)
Physical Therapy Evaluation Patient Details Name: Ann Jarvis MRN: [id number] DOB: 09-07-1965 Today's Date: 12/04/2021  History of Present Illness  Pt is a 56 y/o female admitted secondary to R numbness, and unsteadiness. Found to have L thalamic infarct. PMH includes tobacco abuse, cocaine abuse, graves disease and HTN.  Clinical Impression  Pt admitted secondary to problem above with deficits below. Pt requiring min guard A for mobility. One LOB noted and required min A. Educated about using cane for ambulation to increase safety. Reports her boyfriend can assist as needed at d/c. Would benefit from outpatient PT to address deficits. Will continue to follow acutely.        Recommendations for follow up therapy are one component of a multi-disciplinary discharge planning process, led by the attending physician.  Recommendations may be updated based on patient status, additional functional criteria and insurance authorization.  Follow Up Recommendations Outpatient PT      Assistance Recommended at Discharge Intermittent Supervision/Assistance  Patient can return home with the following  Assistance with cooking/housework;Assist for transportation;A little help with bathing/dressing/bathroom    Equipment Recommendations Cane  Recommendations for Other Services       Functional Status Assessment Patient has had a recent decline in their functional status and demonstrates the ability to make significant improvements in function in a reasonable and predictable amount of time.     Precautions / Restrictions Precautions Precautions: Fall Restrictions Weight Bearing Restrictions: No      Mobility  Bed Mobility Overal bed mobility: Needs Assistance Bed Mobility: Supine to Sit, Sit to Supine     Supine to sit: Min guard Sit to supine: Min guard   General bed mobility comments: Min guard for safety    Transfers Overall transfer level: Needs assistance Equipment used:  None Transfers: Sit to/from Stand Sit to Stand: Min guard           General transfer comment: Min guard for safety    Ambulation/Gait Ambulation/Gait assistance: Min guard, Min assist Gait Distance (Feet): 150 Feet Assistive device: None Gait Pattern/deviations: Step-through pattern, Decreased stride length Gait velocity: Decreased     General Gait Details: Pt tended to drag RLE. Cues for step height. 1 LOB noted, requiring min A. Educated about using cane for increased safety.  Stairs            Wheelchair Mobility    Modified Rankin (Stroke Patients Only) Modified Rankin (Stroke Patients Only) Pre-Morbid Rankin Score: No symptoms Modified Rankin: Moderately severe disability     Balance Overall balance assessment: Needs assistance Sitting-balance support: No upper extremity supported, Feet supported Sitting balance-Leahy Scale: Good     Standing balance support: No upper extremity supported Standing balance-Leahy Scale: Fair                               Pertinent Vitals/Pain Pain Assessment Pain Assessment: No/denies pain    Home Living Family/patient expects to be discharged to:: Private residence Living Arrangements: Spouse/significant other Available Help at Discharge: Family;Available 24 hours/day Type of Home: House Home Access: Stairs to enter Entrance Stairs-Rails: Right;Left;Can reach both Entrance Stairs-Number of Steps: 3   Home Layout: One level Home Equipment: None      Prior Function Prior Level of Function : Independent/Modified Independent                     Hand Dominance        Extremity/Trunk Assessment  Upper Extremity Assessment Upper Extremity Assessment: Defer to OT evaluation    Lower Extremity Assessment Lower Extremity Assessment: RLE deficits/detail RLE Deficits / Details: Grossly 4/5. Reporting decreased sensation, but could still feel when PT was touching with sensation testing.     Cervical / Trunk Assessment Cervical / Trunk Assessment: Normal  Communication   Communication: No difficulties  Cognition Arousal/Alertness: Awake/alert Behavior During Therapy: WFL for tasks assessed/performed Overall Cognitive Status: No family/caregiver present to determine baseline cognitive functioning                                 General Comments: Mildly slower processing. Decreased safety awareness        General Comments      Exercises     Assessment/Plan    PT Assessment Patient needs continued PT services  PT Problem List Decreased strength;Decreased activity tolerance;Decreased balance;Decreased mobility;Decreased coordination;Impaired sensation;Decreased knowledge of use of DME;Decreased safety awareness       PT Treatment Interventions DME instruction;Gait training;Stair training;Functional mobility training;Therapeutic activities;Therapeutic exercise;Balance training;Patient/family education    PT Goals (Current goals can be found in the Care Plan section)  Acute Rehab PT Goals Patient Stated Goal: to go home PT Goal Formulation: With patient Time For Goal Achievement: 12/18/21 Potential to Achieve Goals: Good    Frequency Min 4X/week     Co-evaluation               AM-PAC PT "6 Clicks" Mobility  Outcome Measure Help needed turning from your back to your side while in a flat bed without using bedrails?: None Help needed moving from lying on your back to sitting on the side of a flat bed without using bedrails?: A Little Help needed moving to and from a bed to a chair (including a wheelchair)?: A Little Help needed standing up from a chair using your arms (e.g., wheelchair or bedside chair)?: A Little Help needed to walk in hospital room?: A Little Help needed climbing 3-5 steps with a railing? : A Little 6 Click Score: 19    End of Session Equipment Utilized During Treatment: Gait belt Activity Tolerance: Patient tolerated  treatment well Patient left: in bed;with call bell/phone within reach (on stretcher in ED) Nurse Communication: Mobility status PT Visit Diagnosis: Unsteadiness on feet (R26.81);Muscle weakness (generalized) (M62.81);Difficulty in walking, not elsewhere classified (R26.2)    Time: 1047-1100 PT Time Calculation (min) (ACUTE ONLY): 13 min   Charges:   PT Evaluation $PT Eval Low Complexity: 1 Low          Farley Ly, PT, DPT  Acute Rehabilitation Services  Office: [phone]   Lehman Prom 12/04/2021, 12:43 PM

## 2021-12-04 NOTE — Evaluation (Signed)
Occupational Therapy Evaluation Patient Details Name: Ann Jarvis MRN: [id number] DOB: October 05, 1965 Today's Date: 12/04/2021   History of Present Illness Pt is a 57 y/o female admitted secondary to R numbness, and unsteadiness. Found to have L thalamic infarct. PMH includes tobacco abuse, cocaine abuse, graves disease and HTN.   Clinical Impression   Pt currently min to min guard assist for transfers and LB selfcare sit to stand.  LOB posteriorly and to the right with initial ambulation out of the room.  She also demonstrates slight decreased RUE coordination and strength along with decreased light touch sensation when compared to the LUE.  Feel she will benefit from acute care OT to help increase independence to a modified independent level for return home with her significant other who can be with her all the time per report.       Recommendations for follow up therapy are one component of a multi-disciplinary discharge planning process, led by the attending physician.  Recommendations may be updated based on patient status, additional functional criteria and insurance authorization.   Follow Up Recommendations  Outpatient OT    Assistance Recommended at Discharge Frequent or constant Supervision/Assistance  Patient can return home with the following A little help with walking and/or transfers;Assistance with cooking/housework;Assist for transportation;Help with stairs or ramp for entrance    Functional Status Assessment  Patient has had a recent decline in their functional status and demonstrates the ability to make significant improvements in function in a reasonable and predictable amount of time.  Equipment Recommendations  Tub/shower seat       Precautions / Restrictions Precautions Precautions: Fall Restrictions Weight Bearing Restrictions: No      Mobility Bed Mobility Overal bed mobility: Needs Assistance       Supine to sit: Supervision Sit to supine:  Supervision        Transfers Overall transfer level: Needs assistance Equipment used: None Transfers: Sit to/from Stand, Bed to chair/wheelchair/BSC Sit to Stand: Min assist     Step pivot transfers: Min assist     General transfer comment: Pt with initial LOB with sit to stand from the EOB and for mobility out of the room without an assistive device.      Balance Overall balance assessment: Needs assistance Sitting-balance support: No upper extremity supported, Feet supported Sitting balance-Leahy Scale: Good     Standing balance support: No upper extremity supported, During functional activity Standing balance-Leahy Scale: Poor Standing balance comment: LOB to the right and posteriorly with standing and with mobility                           ADL either performed or assessed with clinical judgement   ADL Overall ADL's : Needs assistance/impaired Eating/Feeding: Modified independent;Sitting   Grooming: Wash/dry hands;Wash/dry face;Min guard;Standing   Upper Body Bathing: Set up;Sitting   Lower Body Bathing: Minimal assistance;Sit to/from stand   Upper Body Dressing : Supervision/safety;Sitting   Lower Body Dressing: Minimal assistance;Sit to/from stand   Toilet Transfer: Minimal assistance;Ambulation   Toileting- Clothing Manipulation and Hygiene: Minimal assistance;Sit to/from stand       Functional mobility during ADLs: Minimal assistance General ADL Comments: Pt with slight decreased dynamic standing balance with mobility.  Min assist needed initially progressing to min guard without use of an assistive device.  Recommend shower seat for home for safety as pt has a tub shower and will need something to sit on.     Vision Baseline Vision/History:  0 No visual deficits Ability to See in Adequate Light: 0 Adequate Patient Visual Report: No change from baseline Vision Assessment?: No apparent visual deficits     Perception Perception Perception:  Within Functional Limits   Praxis Praxis Praxis: Intact    Pertinent Vitals/Pain Pain Assessment Pain Assessment: No/denies pain     Hand Dominance Right   Extremity/Trunk Assessment Upper Extremity Assessment Upper Extremity Assessment: RUE deficits/detail RUE Deficits / Details: AROM shoulder flexion 0-140 degrees with strength at 3+/5, all other joints 4/5 strength RUE Sensation:  (decreased light touch acuity compared to the left.) RUE Coordination: decreased gross motor (slightly slower finger to nose on the right compared with the left.)   Lower Extremity Assessment Lower Extremity Assessment: Defer to PT evaluation   Cervical / Trunk Assessment Cervical / Trunk Assessment: Normal   Communication Communication Communication: No difficulties   Cognition Arousal/Alertness: Awake/alert Behavior During Therapy: WFL for tasks assessed/performed Overall Cognitive Status: Within Functional Limits for tasks assessed                                                  Home Living Family/patient expects to be discharged to:: Private residence Living Arrangements: Spouse/significant other Available Help at Discharge: Family;Available 24 hours/day Type of Home: House Home Access: Stairs to enter [employer] of Steps: 3 Entrance Stairs-Rails: Right;Left;Can reach both Home Layout: One level     Bathroom Shower/Tub: Chief Strategy Officer: Standard     Home Equipment: None          Prior Functioning/Environment Prior Level of Function : Independent/Modified Independent                        OT Problem List: Decreased strength;Decreased range of motion;Impaired balance (sitting and/or standing);Impaired UE functional use;Decreased coordination;Decreased knowledge of use of DME or AE      OT Treatment/Interventions: Self-care/ADL training;Therapeutic exercise;Patient/family education;Balance training;Neuromuscular  education;Therapeutic activities;DME and/or AE instruction    OT Goals(Current goals can be found in the care plan section) Acute Rehab OT Goals Patient Stated Goal: Pt wants to go home as soon as possible OT Goal Formulation: With patient Time For Goal Achievement: 12/18/21  OT Frequency: Min 2X/week       AM-PAC OT "6 Clicks" Daily Activity     Outcome Measure Help from another person eating meals?: None Help from another person taking care of personal grooming?: A Little Help from another person toileting, which includes using toliet, bedpan, or urinal?: A Little Help from another person bathing (including washing, rinsing, drying)?: A Little Help from another person to put on and taking off regular upper body clothing?: None Help from another person to put on and taking off regular lower body clothing?: A Little 6 Click Score: 20   End of Session Equipment Utilized During Treatment: Gait belt Nurse Communication: Mobility status  Activity Tolerance: Patient tolerated treatment well Patient left: in bed  OT Visit Diagnosis: Unsteadiness on feet (R26.81);Muscle weakness (generalized) (M62.81);Hemiplegia and hemiparesis Hemiplegia - Right/Left: Right Hemiplegia - dominant/non-dominant: Dominant Hemiplegia - caused by: Cerebral infarction                Time: 3149-7026 OT Time Calculation (min): 29 min Charges:  OT General Charges $OT Visit: 1 Visit OT Evaluation $OT Eval Moderate Complexity: 1 Mod OT Treatments $  Self Care/Home Management : 8-22 mins  Sura Canul OTR/L 12/04/2021, 4:21 PM

## 2021-12-05 ENCOUNTER — Other Ambulatory Visit: Payer: Self-pay

## 2021-12-05 DIAGNOSIS — I10 Essential (primary) hypertension: Secondary | ICD-10-CM

## 2021-12-05 DIAGNOSIS — I639 Cerebral infarction, unspecified: Secondary | ICD-10-CM | POA: Diagnosis not present

## 2021-12-05 DIAGNOSIS — F17200 Nicotine dependence, unspecified, uncomplicated: Secondary | ICD-10-CM | POA: Diagnosis not present

## 2021-12-05 DIAGNOSIS — F1410 Cocaine abuse, uncomplicated: Secondary | ICD-10-CM | POA: Diagnosis not present

## 2021-12-05 DIAGNOSIS — E890 Postprocedural hypothyroidism: Secondary | ICD-10-CM

## 2021-12-05 LAB — BASIC METABOLIC PANEL
Anion gap: 9 (ref 5–15)
BUN: 20 mg/dL (ref 6–20)
CO2: 25 mmol/L (ref 22–32)
Calcium: 9.3 mg/dL (ref 8.9–10.3)
Chloride: 106 mmol/L (ref 98–111)
Creatinine, Ser: 1.02 mg/dL — ABNORMAL HIGH (ref 0.44–1.00)
GFR, Estimated: >60 mL/min (ref >60)
Glucose, Bld: 95 mg/dL (ref 70–99)
Potassium: 3.9 mmol/L (ref 3.5–5.1)
Sodium: 140 mmol/L (ref 135–145)

## 2021-12-05 LAB — MAGNESIUM: Magnesium: 2.3 mg/dL (ref 1.7–2.4)

## 2021-12-05 MED ORDER — VITAMIN B-1 100 MG PO TABS: 100.0000 mg | ORAL_TABLET | Freq: Every day | ORAL | Status: DC

## 2021-12-05 MED ORDER — ATORVASTATIN CALCIUM 40 MG PO TABS
40.0000 mg | ORAL_TABLET | Freq: Every day | ORAL | 2 refills | Status: DC
Start: 2021-12-06 — End: 2022-04-25
  Filled 2021-12-05: qty 30, 30d supply, fill #0
  Filled 2022-01-09 – 2022-01-16 (×2): qty 30, 30d supply, fill #1
  Filled 2022-02-11 – 2022-02-18 (×2): qty 30, 30d supply, fill #2

## 2021-12-05 MED ORDER — FOLIC ACID 1 MG PO TABS: 1.0000 mg | ORAL_TABLET | Freq: Every day | ORAL | Status: DC

## 2021-12-05 MED ORDER — HYDROCHLOROTHIAZIDE 25 MG PO TABS
25.0000 mg | ORAL_TABLET | Freq: Every day | ORAL | Status: DC
  Administered 2021-12-05: 25 mg via ORAL
  Filled 2021-12-05: qty 1

## 2021-12-05 MED ORDER — LABETALOL HCL 200 MG PO TABS
200.0000 mg | ORAL_TABLET | Freq: Two times a day (BID) | ORAL | 2 refills | Status: DC
  Filled 2021-12-05 (×2): qty 60, 30d supply, fill #0

## 2021-12-05 MED ORDER — AMLODIPINE BESYLATE 5 MG PO TABS
5.0000 mg | ORAL_TABLET | Freq: Every day | ORAL | Status: DC
  Administered 2021-12-05: 5 mg via ORAL
  Filled 2021-12-05: qty 1

## 2021-12-05 MED ORDER — CLOPIDOGREL BISULFATE 75 MG PO TABS
75.0000 mg | ORAL_TABLET | Freq: Every day | ORAL | 0 refills | Status: DC
Start: 2021-12-06 — End: 2021-12-28
  Filled 2021-12-05: qty 21, 21d supply, fill #0

## 2021-12-05 MED ORDER — HYDRALAZINE HCL 20 MG/ML IJ SOLN: 5.0000 mg | Freq: Four times a day (QID) | INTRAMUSCULAR | Status: DC | PRN

## 2021-12-05 MED ORDER — ASPIRIN 81 MG PO TBEC
81.0000 mg | DELAYED_RELEASE_TABLET | Freq: Every day | ORAL | 12 refills | Status: DC
  Filled 2021-12-05: qty 30, 30d supply, fill #0
  Filled 2022-06-12: qty 30, 30d supply, fill #1

## 2021-12-05 MED ORDER — AMLODIPINE BESYLATE 5 MG PO TABS
5.0000 mg | ORAL_TABLET | Freq: Every day | ORAL | 2 refills | Status: DC
Start: 2021-12-06 — End: 2022-02-18
  Filled 2021-12-05: qty 30, 30d supply, fill #0
  Filled 2022-01-09 – 2022-01-16 (×2): qty 30, 30d supply, fill #1
  Filled 2022-02-11 – 2022-02-18 (×2): qty 30, 30d supply, fill #2

## 2021-12-05 NOTE — Plan of Care (Signed)

## 2021-12-05 NOTE — Progress Notes (Signed)
Patient DME has not been received patient requested it be delivered to house. Patient exiting unit now with nurse tech.

## 2021-12-05 NOTE — TOC CAGE-AID Note (Signed)
Transition of Care [facility]) - CAGE-AID Screening   Patient Details  Name: Ann Jarvis MRN: [id number] Date of Birth: May 04, 1965  [facility]) CM/SW Contact:    Kermit Balo, RN Phone Number: 12/05/2021, 1:14 PM   Clinical Narrative: Pt states she will never use cocaine again. She was accepting of counseling resources.   CAGE-AID Screening:    Have You Ever Felt You Ought to Cut Down on Your Drinking or Drug Use?: Yes Have People Annoyed You By Critizing Your Drinking Or Drug Use?: No Have You Felt Bad Or Guilty About Your Drinking Or Drug Use?: No Have You Ever Had a Drink or Used Drugs First Thing In The Morning to Steady Your Nerves or to Get Rid of a Hangover?: No CAGE-AID Score: 1  Substance Abuse Education Offered: Yes (provided)

## 2021-12-05 NOTE — Discharge Summary (Signed)
Physician Discharge Summary   Patient: Ann Jarvis MRN: [id number] DOB: 1965/10/05  Admit date:     12/03/2021  Discharge date: 12/05/21  Discharge Physician: Corrie Mckusick Chidera Dearcos   PCP: Ladell Pier, MD   Recommendations at discharge:   Follow-up with primary care provider in 1 to 2 weeks.  Check CBC BMP magnesium in the next visit. Patient needs to be compliant with antihypertensive medication.  Amlodipine was added and dose of labetalol has been increased.  Need better blood pressure control as outpatient. Patient would need to be encouraged on cessation of illicit substances. Follow-up with [facility] in 4 to 6 weeks-internal referral has been made.  Discharge Diagnoses: Principal Problem:   Acute CVA (cerebrovascular accident) [place]) Active Problems:   TOBACCO ABUSE   Cocaine abuse ([place])   Essential hypertension   Hypothyroidism  Resolved Problems:   * No resolved hospital problems. [facility] Course: 56 year old with a history of polysubstance abuse, Graves' disease status post RAI ablation, tobacco abuse, and HTN who presented to the ER with strokelike symptoms amidst the routine use of alcohol, tobacco, and cocaine.  She admitted to not taking her blood pressure medications for at least a month.  She awoke the day before presentation with right-sided numbness and weakness of the face and to a lesser extent her right arm and leg, and when her symptoms persisted for 24 hours she decided to present to the ER.  Following conditions were addressed during hospitalization,  Acute left thalamic CVA Neurology was consulted.  MRI of the brain showed acute left thalamic infarct.  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  Stroke was thought to be secondary to small vessel disease related to uncontrolled HTN, tobacco, and cocaine.  Neurology recommending aspirin and  Plavix for 3 weeks followed by aspirin alone. LDL 80, hemoglobin A1c 4.8.  2D echocardiogram showed LV ejection fraction of 55 to 60%.  Urine drug screen was positive for cocaine.  Patient has been seen by physical therapy and therapy and recommended outpatient neuro rehab.  Essential hypertension Patient was encouraged compliance with medication.  Amlodipine has been introduced.  Labetalol dose has been increased.  Will need gradual improvement in her blood pressure to decrease the rate of stroke.    Hypokalemia Improved after replacement.  Latest potassium of 3.9.   Graves' disease status post RAI ablation Continue Synthroid -TSH 7.1 likely reflective of noncompliance.  Patient was emphasized to continue medication for   Ongoing polysubstance abuse -cocaine, alcohol, tobacco Patient is motivated to quit.     Noncompliance with medications Besides the need for being on medication.  Consultants: Neurology Procedures performed: None Disposition: Home Diet recommendation:  Discharge Diet Orders (From admission, onward)     Start     Ordered   12/05/21 0000  Diet - low sodium heart healthy        12/05/21 1423           Cardiac diet DISCHARGE MEDICATION: Allergies as of 12/05/2021       Reactions   Ciprofloxacin Rash   Significant rash, taking with Flagyl   Flagyl [metronidazole] Rash   Developed significant rash while taking with Cipro   Penicillins Swelling   Did it involve swelling of the face/tongue/throat, SOB, or low BP? No Did it involve sudden or severe rash/hives, skin peeling, or any reaction on the inside of your mouth or  nose? No Did you need to seek medical attention at a hospital or doctor's office? No When did it last happen?  Pt states a long time ago     If all above answers are "NO", may proceed with cephalosporin use.        Medication List     TAKE these medications    amLODipine 5 MG tablet Commonly known as: NORVASC Take 1 tablet (5 mg total)  by mouth daily. Start taking on: December 06, 2021   aspirin EC 81 MG tablet Take 1 tablet (81 mg total) by mouth daily. Swallow whole. Start taking on: December 06, 2021   atorvastatin 40 MG tablet Commonly known as: LIPITOR Take 1 tablet (40 mg total) by mouth daily. Start taking on: December 06, 2021   clopidogrel 75 MG tablet Commonly known as: PLAVIX Take 1 tablet (75 mg total) by mouth daily for 21 days. Start taking on: September 7, [id number]   folic acid 1 MG tablet Commonly known as: FOLVITE Take 1 tablet (1 mg total) by mouth daily. Start taking on: December 06, 2021   hydrochlorothiazide 25 MG tablet Commonly known as: HYDRODIURIL Take 1 tablet (25 mg total) by mouth daily. MUST KEEP APPT 09/2021 for further refills   labetalol 200 MG tablet Commonly known as: NORMODYNE Take 1 tablet (200 mg total) by mouth 2 (two) times daily. What changed:  medication strength how much to take   levothyroxine 137 MCG tablet Commonly known as: SYNTHROID Take 1 tablet (137 mcg total) by mouth daily before breakfast.   losartan 100 MG tablet Commonly known as: COZAAR Take 1 tablet (100 mg total) by mouth daily. Must keep appt 09/2021 for further refills   thiamine 100 MG tablet Commonly known as: Vitamin B-1 Take 1 tablet (100 mg total) by mouth daily. Start taking on: December 06, 2021   TYLENOL ALLERGY MULTI-SYMPTOM PO Take 2 tablets by mouth every 6 (six) hours as needed (cold symptoms).        Follow-up Information     [place]. Schedule an appointment as soon as possible for a visit in 1 week(s).   Specialty: Rehabilitation Contact information: [address] [place] [id number] [place] transportation services Follow up.   Why: You will need to call a few days in advance of appointments Contact information: [phone]                Subjective Today, patient was seen and examined at bedside.  Patient states that she feels okay.  Denies any dizziness lightheadedness headache nausea vomiting.  Seen by neurology.  Blood pressure on the higher side.  Discharge Exam:    12/05/2021   12:48 PM 12/05/2021   11:01 AM 12/05/2021    8:24 AM  Vitals with BMI  Systolic [id number]  Diastolic 83 84 85  Pulse 56 66 73   There is no height or weight on file to calculate BMI.  General:  Average built, not in obvious distress HENT:   No scleral pallor or icterus noted. Oral mucosa is moist.  Chest:  Clear breath sounds.  Diminished breath sounds bilaterally. No crackles or wheezes.  CVS: S1 &S2 heard. No murmur.  Regular rate and rhythm. Abdomen: Soft, nontender, nondistended.  Bowel sounds are heard.   Extremities: No cyanosis, clubbing or edema.  Peripheral pulses are palpable. Psych: Alert, awake and oriented, normal mood CNS:  No cranial nerve deficits.  Power equal in all extremities.   Skin: Warm and dry.  No rashes noted.   Condition at discharge: good  The results of significant diagnostics from this hospitalization (including imaging, microbiology, ancillary and laboratory) are listed below for reference.   Imaging Studies: ECHOCARDIOGRAM COMPLETE  Result Date: 12/04/2021    ECHOCARDIOGRAM REPORT   Patient Name:   Ann Jarvis Date of Exam: 12/04/2021 Medical Rec #:  [id number]        Height:       71.0 in Accession #:    [id number]       Weight:       142.4 lb Date of Birth:  Oct 05, 1965       BSA:          1.825 m Patient Age:    39 years         BP:           180/92 mmHg Patient Gender: F                HR:           55 bpm. Exam Location:  Inpatient Procedure: 2D Echo, Cardiac Doppler and Color Doppler Indications:    Stroke  History:        Patient has prior history of Echocardiogram examinations, most                 recent 03/30/2019. Risk Factors:Hypertension and Current Smoker.                 Polysubstance abuse,  Graves disease.  Sonographer:    Eartha Inch Referring Phys: Lorenza Chick  Sonographer Comments: Image acquisition challenging due to respiratory motion and Image acquisition challenging due to patient body habitus. IMPRESSIONS  1. Left ventricular ejection fraction, by estimation, is 55 to 60%. The left ventricle has normal function. The left ventricle has no regional wall motion abnormalities. There is severe concentric left ventricular hypertrophy. Left ventricular diastolic  parameters are consistent with Grade II diastolic dysfunction (pseudonormalization). Elevated left atrial pressure.  2. Right ventricular systolic function is normal. The right ventricular size is normal. There is normal pulmonary artery systolic pressure.  3. Left atrial size was mildly dilated.  4. The mitral valve is normal in structure. Mild mitral valve regurgitation.  5. The aortic valve is tricuspid. Aortic valve regurgitation is trivial. No aortic stenosis is present. Comparison(s): A prior study was performed on 03/30/2019 [facility]. Prior images unable to be directly viewed, comparison made by report only. Conclusion(s)/Recommendation(s): Findings consistent with hypertrophic cardiomyopathy. No intracardiac source of embolism detected on this transthoracic study. Consider a transesophageal echocardiogram to exclude cardiac source of embolism if clinically indicated. FINDINGS  Left Ventricle: Left ventricular ejection fraction, by estimation, is 55 to 60%. The left ventricle has normal function. The left ventricle has no regional wall motion abnormalities. The left ventricular internal cavity size was normal in size. There is  severe concentric left ventricular hypertrophy. Left ventricular diastolic parameters are consistent with Grade II diastolic dysfunction (pseudonormalization). Elevated left atrial pressure. Right Ventricle: The right ventricular size is normal. No increase in right ventricular wall thickness.  Right ventricular systolic function is normal. There is normal pulmonary artery systolic pressure. The tricuspid regurgitant velocity is 1.53 m/s, and  with an assumed right atrial pressure of 3 mmHg, the estimated right ventricular systolic pressure is 0000000 mmHg. Left [facility]: Left atrial size was mildly dilated. Right [facility]: Right atrial  size was normal in size. Pericardium: There is no evidence of pericardial effusion. Mitral Valve: The mitral valve is normal in structure. Mild mitral valve regurgitation. Tricuspid Valve: The tricuspid valve is grossly normal. Tricuspid valve regurgitation is mild. Aortic Valve: The aortic valve is tricuspid. Aortic valve regurgitation is trivial. No aortic stenosis is present. Pulmonic Valve: The pulmonic valve was normal in structure. Pulmonic valve regurgitation is not visualized. Aorta: The aortic root is normal in size and structure. IAS/Shunts: The atrial septum is grossly normal.  LEFT VENTRICLE PLAX 2D LVIDd:         4.20 cm   Diastology LVIDs:         2.70 cm   LV e' medial:    4.35 cm/s LV PW:         1.70 cm   LV E/e' medial:  17.4 LV IVS:        2.00 cm   LV e' lateral:   3.21 cm/s LVOT diam:     2.10 cm   LV E/e' lateral: 23.5 LV SV:         66 LV SV Index:   36 LVOT Area:     3.46 cm  RIGHT VENTRICLE             IVC RV S prime:     12.40 cm/s  IVC diam: 1.10 cm TAPSE (M-mode): 2.1 cm LEFT ATRIUM             Index        RIGHT ATRIUM           Index LA diam:        3.50 cm 1.92 cm/m   RA Area:     15.10 cm LA Vol (A2C):   22.9 ml 12.55 ml/m  RA Volume:   37.00 ml  20.27 ml/m LA Vol (A4C):   26.2 ml 14.36 ml/m LA Biplane Vol: 25.4 ml 13.92 ml/m  AORTIC VALVE LVOT Vmax:   88.20 cm/s LVOT Vmean:  62.000 cm/s LVOT VTI:    0.191 m  AORTA Ao Root diam: 2.50 cm MITRAL VALVE               TRICUSPID VALVE MV Area (PHT): 2.03 cm    TR Peak grad:   9.4 mmHg MV Decel Time: 374 msec    TR Vmax:        153.00 cm/s MR Peak grad: 22.7 mmHg MR Vmax:      238.00 cm/s  SHUNTS  MV E velocity: 75.50 cm/s  Systemic VTI:  0.19 m MV A velocity: 50.10 cm/s  Systemic Diam: 2.10 cm MV E/A ratio:  1.51 Mihai Croitoru MD Electronically signed by Thurmon Fair MD Signature Date/Time: 12/04/2021/1:24:34 PM    Final    MR BRAIN WO CONTRAST  Result Date: 12/03/2021 CLINICAL DATA:  Neuro deficit, acute, stroke suspected. Right-sided numbness. EXAM: MRI HEAD WITHOUT CONTRAST TECHNIQUE: Multiplanar, multiecho pulse sequences of the brain and surrounding structures were obtained without intravenous contrast. COMPARISON:  Head CT and CTA 12/03/2021 FINDINGS: Brain: There is a 1 cm acute infarct in the left thalamus. Chronic microhemorrhages are noted in the more dorsal aspect of the left thalamus and in the right lentiform nucleus. Small T2 hyperintensities scattered throughout the cerebral white matter bilaterally are nonspecific but compatible with mildly to moderately age advanced chronic small vessel ischemic disease. There is a chronic lacunar infarct in the left caudate head. The ventricles and sulci are normal. No mass, midline shift,  or extra-axial fluid collection is identified. Vascular: Major intracranial vascular flow voids are preserved. Skull and upper cervical spine: Unremarkable bone marrow signal. Sinuses/Orbits: Unremarkable orbits. Small volume secretions in a right ethmoid air cell. Trace right mastoid fluid. Other: None. IMPRESSION: 1. Acute left thalamic infarct. 2. Mildly to moderately age advanced chronic small vessel ischemic disease. Electronically Signed   By: Logan Bores M.D.   On: 12/03/2021 19:49   CT HEAD WO CONTRAST  Addendum Date: 12/03/2021   ADDENDUM REPORT: 12/03/2021 17:42 ADDENDUM: Original report by Dr. Autumn Patty. Addendum by Dr. Jeralyn Ruths on 12/03/2021 at 5:40 p.m.: There is a lacunar infarct involving the lateral aspect of the left thalamus which may be acute given the presenting symptoms. Neurology is aware. Electronically Signed   By: Logan Bores M.D.   On:  12/03/2021 17:42   Result Date: 12/03/2021 CLINICAL DATA:  Right-sided numbness, off balance and slurred speech beginning 2 days ago. EXAM: CT HEAD WITHOUT CONTRAST TECHNIQUE: Contiguous axial images were obtained from the base of the skull through the vertex without intravenous contrast. RADIATION DOSE REDUCTION: This exam was performed according to the departmental dose-optimization program which includes automated exposure control, adjustment of the mA and/or kV according to patient size and/or use of iterative reconstruction technique. COMPARISON:  03/11/2016 FINDINGS: Brain: No evidence of acute infarction, hemorrhage, hydrocephalus, extra-axial collection or mass lesion/mass effect. Vascular: No hyperdense vessel or unexpected calcification. Skull: Normal. Negative for fracture or focal lesion. Sinuses/Orbits: Globes and orbits are unremarkable. Visualized sinuses are clear. Other: None peer IMPRESSION: Normal unenhanced CT scan of the brain. Electronically Signed: By: Lajean Manes M.D. On: 12/03/2021 14:01   CT ANGIO HEAD NECK W WO CM  Result Date: 12/03/2021 CLINICAL DATA:  Stroke follow-up. Right-sided numbness, gait disturbance, and slurred speech. EXAM: CT ANGIOGRAPHY HEAD AND NECK TECHNIQUE: Multidetector CT imaging of the head and neck was performed using the standard protocol during bolus administration of intravenous contrast. Multiplanar CT image reconstructions and MIPs were obtained to evaluate the vascular anatomy. Carotid stenosis measurements (when applicable) are obtained utilizing NASCET criteria, using the distal internal carotid diameter as the denominator. RADIATION DOSE REDUCTION: This exam was performed according to the departmental dose-optimization program which includes automated exposure control, adjustment of the mA and/or kV according to patient size and/or use of iterative reconstruction technique. CONTRAST:  16mL OMNIPAQUE IOHEXOL 350 MG/ML SOLN COMPARISON:  Soft tissue neck  CT 04/10/2012 FINDINGS: CTA NECK FINDINGS Aortic arch: Standard 3 vessel aortic arch with widely patent arch vessel origins. Right carotid system: Patent without evidence of stenosis, dissection, or significant atherosclerosis. Tortuous distal cervical ICA. Left carotid system: Patent without evidence of stenosis, dissection, or significant atherosclerosis. Tortuous mid cervical ICA. Vertebral arteries: Patent with the right being dominant. Limited assessment of the left V1 segment due to refluxed venous contrast. No evidence of a significant stenosis or dissection elsewhere involving either vertebral artery. Skeleton: Edentulous.  Advanced disc degeneration at C5-6 and C6-7. Other neck: No evidence of cervical lymphadenopathy or mass. Upper chest: No apical lung consolidation or mass. Review of the MIP images confirms the above findings CTA HEAD FINDINGS Anterior circulation: The internal carotid arteries are widely patent from skull base to carotid termini. ACAs and MCAs are patent without evidence a proximal branch occlusion or significant proximal stenosis. There is a 2-3 mm aneurysm projecting laterally from the left MCA trifurcation. Posterior circulation: The intracranial vertebral arteries are widely patent to the basilar, with the left being particularly hypoplastic distal to  the PICA origin. Patent PICA and SCA origins are seen bilaterally. The basilar artery is widely patent. There are left larger than right posterior communicating arteries. The PCAs are patent with mild irregularity but no flow limiting proximal stenosis. No aneurysm is identified. Venous sinuses: As permitted by contrast timing, patent. Anatomic variants: None. Review of the MIP images confirms the above findings IMPRESSION: 1. No large vessel occlusion or significant proximal stenosis in the head and neck. 2. 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 Electronically Signed   By: Logan Bores M.D.   On: 12/03/2021 17:38     Microbiology: Results for orders placed or performed during the hospital encounter of 12/03/21  Resp Panel by RT-PCR (Flu A&B, Covid) Anterior Nasal Swab     Status: None   Collection Time: 12/03/21 12:51 PM   Specimen: Anterior Nasal Swab  Result Value Ref Range Status   SARS Coronavirus 2 by RT PCR NEGATIVE NEGATIVE Final    Comment: (NOTE) SARS-CoV-2 target nucleic acids are NOT DETECTED.  The SARS-CoV-2 RNA is generally detectable in upper respiratory specimens during the acute phase of infection. The lowest concentration of SARS-CoV-2 viral copies this assay can detect is 138 copies/mL. A negative result does not preclude SARS-Cov-2 infection and should not be used as the sole basis for treatment or other patient management decisions. A negative result may occur with  improper specimen collection/handling, submission of specimen other than nasopharyngeal swab, presence of viral mutation(s) within the areas targeted by this assay, and inadequate number of viral copies(<138 copies/mL). A negative result must be combined with clinical observations, patient history, and epidemiological information. The expected result is Negative.  Fact Sheet for Patients:  EntrepreneurPulse.com.au  Fact Sheet for Healthcare Providers:  IncredibleEmployment.be  This test is no t yet approved or cleared by the [place] FDA and  has been authorized for detection and/or diagnosis of SARS-CoV-2 by FDA under an Emergency Use Authorization (EUA). This EUA will remain  in effect (meaning this test can be used) for the duration of the COVID-19 declaration under Section 564(b)(1) of the Act, 21 U.S.C.section 360bbb-3(b)(1), unless the authorization is terminated  or revoked sooner.       Influenza A by PCR NEGATIVE NEGATIVE Final   Influenza B by PCR NEGATIVE NEGATIVE Final    Comment: (NOTE) The Xpert Xpress SARS-CoV-2/FLU/RSV plus assay is intended as an  aid in the diagnosis of influenza from Nasopharyngeal swab specimens and should not be used as a sole basis for treatment. Nasal washings and aspirates are unacceptable for Xpert Xpress SARS-CoV-2/FLU/RSV testing.  Fact Sheet for Patients: EntrepreneurPulse.com.au  Fact Sheet for Healthcare Providers: IncredibleEmployment.be  This test is not yet approved or cleared by the [place] FDA and has been authorized for detection and/or diagnosis of SARS-CoV-2 by FDA under an Emergency Use Authorization (EUA). This EUA will remain in effect (meaning this test can be used) for the duration of the COVID-19 declaration under Section 564(b)(1) of the Act, 21 U.S.C. section 360bbb-3(b)(1), unless the authorization is terminated or revoked.  Performed at [facility], [place] [address], [place], [place] [address]     Labs: CBC: Recent Labs  Lab 12/03/21 1325 12/03/21 1411  WBC 5.1  --   NEUTROABS 3.2  --   HGB 12.7 13.6  HCT 40.1 40.0  MCV 91.3  --   PLT 234  --    Basic Metabolic Panel: Recent Labs  Lab 12/03/21 1325 12/03/21 1411 12/05/21 0417  NA 139  141 140  K 3.3* 3.1* 3.9  CL 105 102 106  CO2 27  --  25  GLUCOSE 123* 118* 95  BUN 17 18 20   CREATININE 1.05* 1.00 1.02*  CALCIUM 8.7*  --  9.3  MG  --   --  2.3   Liver Function Tests: Recent Labs  Lab 12/03/21 1325  AST 17  ALT 9  ALKPHOS 60  BILITOT 0.6  PROT 7.9  ALBUMIN 3.0*   CBG: No results for input(s): "GLUCAP" in the last 168 hours.  Discharge time spent: greater than 30 minutes.  Signed: 02/02/22, MD [facility] 12/05/2021

## 2021-12-05 NOTE — TOC Initial Note (Signed)
Transition of Care [facility]) - Initial/Assessment Note    Patient Details  Name: Ann Jarvis MRN: [id number] Date of Birth: Nov 04, 1965  Transition of Care [facility]) CM/SW Contact:    Kermit Balo, RN Phone Number: 12/05/2021, 1:09 PM  Clinical Narrative:                 Patient is from home alone. She states she can stay with her boyfriend or daughter at d/c. She has medicaid but has not used their transportation services. CM will add this information to her AVS.  PCP: [facility] and uses their pharmacy Cane/ tub bench to be delivered to the room per [employer]. Pt admits not being compliant with medications at home but states she is going to do her best after d/c to be more compliant.  Outpatient therapy arranged through [facility]. Information on the AVS.  SDOH Interventions Today    Flowsheet Row Most Recent Value  SDOH Interventions   Food Insecurity Interventions Other (Comment), [id number] Referral  [Blue and Green books provided/ NCCARE 360 referral]  Transportation Interventions Other (Comment)  [pt has medicaid so can access their services. CM will provide information on the AVS]      TOC following.  Expected Discharge Plan: OP Rehab Barriers to Discharge: Continued Medical Work up   Patient Goals and CMS Choice     Choice offered to / list presented to : Patient  Expected Discharge Plan and Services Expected Discharge Plan: OP Rehab   Discharge Planning Services: CM Consult Post Acute Care Choice: Durable Medical Equipment Living arrangements for the past 2 months: Single Family Home                 DME Arranged: Tub bench, Cane DME Agency: [employer] Date DME Agency Contacted: 12/05/21                Prior Living Arrangements/Services Living arrangements for the past 2 months: Single Family Home Lives with:: Self Patient language and need for interpreter reviewed:: Yes Do you feel safe going back to the place where you live?: Yes             Criminal Activity/Legal Involvement Pertinent to Current Situation/Hospitalization: No - Comment as needed  Activities of Daily Living Home Assistive Devices/Equipment: None ADL Screening (condition at time of admission) Patient's cognitive ability adequate to safely complete daily activities?: Yes Is the patient deaf or have difficulty hearing?: No Does the patient have difficulty seeing, even when wearing glasses/contacts?: No Does the patient have difficulty concentrating, remembering, or making decisions?: No Patient able to express need for assistance with ADLs?: Yes Does the patient have difficulty dressing or bathing?: No Independently performs ADLs?: Yes (appropriate for developmental age) Does the patient have difficulty walking or climbing stairs?: No Weakness of Legs: None Weakness of Arms/Hands: None  Permission Sought/Granted                  Emotional Assessment Appearance:: Appears stated age Attitude/Demeanor/Rapport: Engaged Affect (typically observed): Accepting Orientation: : Oriented to Self, Oriented to Place, Oriented to  Time, Oriented to Situation Alcohol / Substance Use: Illicit Drugs Psych Involvement: No (comment)  Admission diagnosis:  Hypertensive urgency [I16.0] Acute CVA (cerebrovascular accident) [facility]) [I63.9] Cerebrovascular accident (CVA), unspecified mechanism (HCC) [I63.9] Patient Active Problem List   Diagnosis Date Noted   Acute CVA (cerebrovascular accident) (HCC) 12/03/2021   Gastroesophageal reflux disease without esophagitis 07/16/2019   Hot flashes 03/13/2017   Chronic pain of right knee 12/12/2016  Chronic pain of left ankle 12/12/2016   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 [place] [address] Phone: [phone] Fax: [phone]  [facility] [address] [place] [address] Phone: [phone] Fax: [phone]  [facility] - [place], [place] - [address] [place] [address] Phone: [phone] Fax: [phone]     Social Determinants of Health (SDOH) Interventions Food Insecurity Interventions: Other (Comment), [id number] Referral (Blue and Green books provided/ NCCARE 360 referral) Transportation Interventions: Other (Comment) (pt has medicaid so can access their services. CM will provide information on the AVS)  Readmission Risk Interventions     No data to display

## 2021-12-05 NOTE — Progress Notes (Signed)
Discharge instructions reviewed with pt. Copy of instructions given to pt, pt to pick up her filled scripts at [facility] by 5pm closing time.  Pt verbalized understanding and knows where to pick up her medications.   Pt waiting for DME--cane and tub bench to be delivered to her room.  Pt states her daughter is coming to pick her up.

## 2021-12-05 NOTE — Progress Notes (Signed)
Physical Therapy Treatment Patient Details Name: Ann Jarvis MRN: [id number] DOB: [birth year] Today's Date: 12/05/2021   History of Present Illness Pt is a 56 y/o female admitted secondary to R numbness, and unsteadiness. Found to have L thalamic infarct. PMH includes tobacco abuse, cocaine abuse, graves disease and HTN.    PT Comments    Emphasis on safe use of a standard cane during gait and on the stairs.  Pt return demonstrated safely.   Recommendations for follow up therapy are one component of a multi-disciplinary discharge planning process, led by the attending physician.  Recommendations may be updated based on patient status, additional functional criteria and insurance authorization.  Follow Up Recommendations  Outpatient PT     Assistance Recommended at Discharge Intermittent Supervision/Assistance  Patient can return home with the following Assistance with cooking/housework;Assist for transportation;A little help with bathing/dressing/bathroom   Equipment Recommendations  Cane    Recommendations for Other Services       Precautions / Restrictions Precautions Precautions: Fall     Mobility  Bed Mobility               General bed mobility comments: OOB on arrival    Transfers Overall transfer level: Needs assistance Equipment used: None Transfers: Sit to/from Stand Sit to Stand: Supervision   Step pivot transfers: Supervision            Ambulation/Gait Ambulation/Gait assistance: Supervision Gait Distance (Feet): 200 Feet Assistive device: Straight cane Gait Pattern/deviations: Step-through pattern   Gait velocity interpretation: 1.31 - 2.62 ft/sec, indicative of limited community ambulator   General Gait Details: After demonstration and hand over hand assist   Stairs Stairs: Yes Stairs assistance: Supervision Stair Management: One rail Right, With cane Number of Stairs: 4 General stair comments: safe with the rail.  Used cane  appropriately in this situation.   Wheelchair Mobility    Modified Rankin (Stroke Patients Only) Modified Rankin (Stroke Patients Only) Modified Rankin: Moderately severe disability     Balance Overall balance assessment: Needs assistance Sitting-balance support: No upper extremity supported, Feet supported Sitting balance-Leahy Scale: Good       Standing balance-Leahy Scale: Fair Standing balance comment: can helps with stability, but not mandatory.                            Cognition Arousal/Alertness: Awake/alert Behavior During Therapy: WFL for tasks assessed/performed Overall Cognitive Status: Within Functional Limits for tasks assessed                                          Exercises      General Comments        Pertinent Vitals/Pain Pain Assessment Pain Assessment: Faces Faces Pain Scale: No hurt Pain Intervention(s): Monitored during session    Home Living                          Prior Function            PT Goals (current goals can now be found in the care plan section) Acute Rehab PT Goals Patient Stated Goal: to go home PT Goal Formulation: With patient Time For Goal Achievement: 12/18/21 Potential to Achieve Goals: Good Progress towards PT goals: Progressing toward goals    Frequency    Min 4X/week  PT Plan Current plan remains appropriate    Co-evaluation              AM-PAC PT "6 Clicks" Mobility   Outcome Measure  Help needed turning from your back to your side while in a flat bed without using bedrails?: None Help needed moving from lying on your back to sitting on the side of a flat bed without using bedrails?: A Little Help needed moving to and from a bed to a chair (including a wheelchair)?: A Little Help needed standing up from a chair using your arms (e.g., wheelchair or bedside chair)?: A Little Help needed to walk in hospital room?: A Little Help needed climbing 3-5  steps with a railing? : A Little 6 Click Score: 19    End of Session   Activity Tolerance: Patient tolerated treatment well Patient left: in chair Nurse Communication: Mobility status PT Visit Diagnosis: Unsteadiness on feet (R26.81)     Time: 1610-9604 PT Time Calculation (min) (ACUTE ONLY): 10 min  Charges:  $Gait Training: 8-22 mins                     12/05/2021  Jacinto Halim., PT Acute Rehabilitation Services [phone]  (pager) [phone]  (office)   Eliseo Gum Okey Zelek 12/05/2021, 3:50 PM

## 2021-12-05 NOTE — Progress Notes (Addendum)
STROKE TEAM PROGRESS NOTE   ATTENDING NOTE: I reviewed above note and agree with the assessment and plan. Pt was seen and examined.   No acute event overnight.  Patient no complaints.  She is eager to go home.  BP has improved to some after BP p.o. medication.  Educated on home BP monitoring and continued titrating BP medications by PCP.  Gradually normalize BP in 3 to 5 days.  Again educated on smoking cessation, cocaine cessation, THC cessation, and alcohol limitation.  Continue aspirin 81 and Plavix DAPT for 3 weeks and then aspirin alone.  Continue Lipitor 40.  PT/OT recommend outpatient therapy.  For detailed assessment and plan, please refer to above/below as I have made changes wherever appropriate.   Neurology will sign off. Please call with questions. Pt will follow up with stroke clinic NP at [facility] in about 4 weeks. Thanks for the consult.   Rosalin Hawking, MD PhD Stroke Neurology 12/05/2021 5:29 PM    INTERVAL HISTORY There is no family member @ the bedside, Patient is in her room sitting in a recliner chair, watching TV, She is cordial with examiner. Speech is at baseline. She has been ambulating in room and hall ways.   No c/o headache, no speech difficulty, no swallowing diffculty, no dizziness, syncope , numbness or weakness BP pressure still remains high and managed by primary team  Vitals:   12/05/21 0419 12/05/21 0824 12/05/21 1101 12/05/21 1248  BP: (!) 192/91 (!) 189/85 (!) 157/84 (!) 183/83  Pulse: (!) 52 73 66 (!) 56  Resp: 16 17  19   Temp: 98 F (36.7 C) 97.8 F (36.6 C)  97.7 F (36.5 C)  TempSrc: Oral Oral  Oral  SpO2: 100% 100%  100%   CBC:  Recent Labs  Lab 12/03/21 1325 12/03/21 1411  WBC 5.1  --   NEUTROABS 3.2  --   HGB 12.7 13.6  HCT 40.1 40.0  MCV 91.3  --   PLT 234  --    Basic Metabolic Panel:  Recent Labs  Lab 12/03/21 1325 12/03/21 1411 12/05/21 0417  NA 139 141 140  K 3.3* 3.1* 3.9  CL 105 102 106  CO2 27  --  25  GLUCOSE 123*  118* 95  BUN 17 18 20   CREATININE 1.05* 1.00 1.02*  CALCIUM 8.7*  --  9.3  MG  --   --  2.3   Lipid Panel:  Recent Labs  Lab 12/04/21 0433  CHOL 153  TRIG 62  HDL 61  CHOLHDL 2.5  VLDL 12  LDLCALC 80   HgbA1c:  Recent Labs  Lab 12/04/21 0433  HGBA1C 4.8   Urine Drug Screen:  Recent Labs  Lab 12/03/21 1715  LABOPIA NONE DETECTED  COCAINSCRNUR POSITIVE*  LABBENZ NONE DETECTED  AMPHETMU NONE DETECTED  THCU NONE DETECTED  LABBARB NONE DETECTED    Alcohol Level  Recent Labs  Lab 12/04/21 0433  ETH <10    IMAGING past 24 hours No results found.  Echocardiogram: IMPRESSIONS     1. Left ventricular ejection fraction, by estimation, is 55 to 60%. The  left ventricle has normal function. The left ventricle has no regional  wall motion abnormalities. There is severe concentric left ventricular  hypertrophy. Left ventricular diastolic   parameters are consistent with Grade II diastolic dysfunction  (pseudonormalization). Elevated left atrial pressure.   2. Right ventricular systolic function is normal. The right ventricular  size is normal. There is normal pulmonary artery systolic pressure.  3. Left atrial size was mildly dilated.   4. The mitral valve is normal in structure. Mild mitral valve  regurgitation.   5. The aortic valve is tricuspid. Aortic valve regurgitation is trivial.  No aortic stenosis is present.    Neuro Exam: Mental Status: Awake, Alert & Oriented to self, place and year. She did state year as 2023, month as September.She knows the reasoning for this admission. Speech: slightly Dysarthric Language: naming,repetition, fluency and comprehension is intact CN II-XII: PERRL 3 mm reactive bilateral,no facial asymmetry, visual fields are grossly intact, positive right  facial sensation diminished, tongue/soft palate/uvula is midline. No evidence of tongue atrophy or fasciculations Motor Strength: Positive slight RUE Drift , No tremors  observed RUE 5-/5     RLE 5-/5 -Still weak and improved   LUE  5/5       LLE 5/5  Tone is normal Sensation: Decreased on RUE/hemibody /RLE. Tone:Normal Coordination: FTN and HTS is intact bilateral. No Ataxia  Gait: Deferred    ASSESSMENT/PLAN [name] is a 56 y.o. female with history of polysubstance abuse including  marijuana,cocaine, tobacco abuse,hypertension with RX non compliance.,states ran out of medication 2 months ago. Patient also with history of graves disease, s/p RAI ablation, HTN, HDL presenting with  numbness on right side of her body with mild weakness, speech difficulty that was dysarthric and balance problems with ambulation. Patient states she woke up on the morning of her admission with right sided numbness, She thought it would improve.There was no improvement within several days  and she opted to be evaluated @ [facility]. LKW was on 12/01/21 and she was not a TNK candidate Her imaging studies included CT HEAD (non-con)  which revealed age indeterminate left thalamocapsular infarct. CTA Head/Neck shows no large vessel occlusion or significant proximal stenosis.  Patient admitted for observation and further stroke workup. MRI Brain w/ contrast obtained yesterday reveals Acute left thalamic infarct. Patient was loaded with Plavix & ASA (DAPT). Her stroke etiology is most likely due to small vessel disease due to vascular stroke risk factors to include cocaine use, tobacco use and poorly controlled hypertension.   Ischemic Stroke : Acute Left Thalamic Stroke secondary to small vessel disease Neuro checks q 4 hours Echocardiogram completed with EF 55-60% Statin Therapy: Continue Lipitor 40 mg qhs. LDL goal < 70  Etiology for patient lacunar stroke likely due to small vessel disease given multiple uncontrolled risk factors.  Patient BP still on the higher end, outside of permissive hypertension window, will gradually normalize BP.  Currently on Cozaar 100, HCTZ 12.5 and  labetalol 200 twice daily.    Aggressive risk factor modification.  On aspirin and Plavix DAPT for 3 weeks and then aspirin alone.  Continue Lipitor 40.   Cocaine, tobacco and THC cessation education provided, alcohol limitation education also provided.  Patient willing to quit.  Patient stated she is ready to quit. D/W her daughter and has made to live with her daughter  Patient can be discharged home from our stand point. Follow up @ [facility] We will sign off @ this time . Please call with any questions or concern  VTE prophylaxis - Lovenox    Diet   Diet Heart Room service appropriate? Yes; Fluid consistency: Thin    Hypertension Poorly controled hypertension 2/2 medical non compliance Patients stroke symptoms started on 12/01/21 and # this time is out of the window for permissive hypertension and her outpatient medications can be reintroduced.This am will start labetolo 100  mg po q 12 hours. Primary Team managing antihypertensive medications  Long-term BP goal normotensive. Also patient has been non compliant due to cost of some medications. Primary team asked for TOC assitance  Hyperlipidemia Home meds: Restart Lipitor 40 mg qhs resumed in hospital LDL 80, goal < 70 Continue statin at discharge  Polysubstance Abuse: This s ongoing with patient . She has been using cocaine, marijuana and alcohol Discussed and educated patient not to use multiple substance and will need councelling/Education to stop/quit illicit drugs   Other Stroke Risk Factors Advanced Age >/= 51  Cigarette smokeradvised to stop smoking ETOH use, alcohol . Substance abuse - UDS:  THC NONE DETECTED, Cocaine POSITIVE. Patient advised to stop using due to stroke risk. HgbA1c @ 4.8%  Hypertension Medical non-compliance    Hospital day # 2  Camillo Flaming , PA-C  To contact Stroke Continuity provider, please refer to [email or web address]. After hours, contact General Neurology

## 2021-12-06 ENCOUNTER — Telehealth: Payer: Self-pay

## 2021-12-06 NOTE — Telephone Encounter (Signed)
Transition Care Management Unsuccessful Follow-up Telephone Call  Date of discharge and from where:  12/05/2021, [facility]  Attempts:  1st Attempt  Reason for unsuccessful TCM follow-up call:  Voice mail full # [phone].  She had another number listed: [phone]. I called that number and was told that it was a wrong number by the person who answered.  Number then removed from Epic.   Need to schedule patient for a follow up appointment at [facility] with Dr Laural Benes

## 2021-12-10 ENCOUNTER — Telehealth: Payer: Self-pay

## 2021-12-10 NOTE — Telephone Encounter (Signed)
Transition Care Management Unsuccessful Follow-up Telephone Call  Date of discharge and from where:  12/05/2021, [facility]   Attempts:  2nd Attempt  Reason for unsuccessful TCM follow-up call:  Voice mail full  # [phone].  Need to schedule patient for a follow up appointment at [facility] with Dr Laural Benes

## 2021-12-11 ENCOUNTER — Telehealth: Payer: Self-pay

## 2021-12-11 NOTE — Telephone Encounter (Signed)
Transition Care Management Unsuccessful Follow-up Telephone Call  Date of discharge and from where:  12/05/2021, [facility]    Attempts:  3rd Attempt  Reason for unsuccessful TCM follow-up call:  Voice mail full  # [phone].  Patient has a hospital follow up appointment scheduled at [facility] with Gwinda Passe, NP -  12/26/2021

## 2021-12-20 ENCOUNTER — Telehealth: Payer: Self-pay

## 2021-12-20 NOTE — Patient Outreach (Signed)
  E[facility]i Stroke Care Coordination Follow Up  12/20/2021 Name:  Ann Jarvis MRN:  [id number] DOB:  [birth year]  Subjective: Ann Jarvis is a 56 y.o. year old female who is a primary care patient of Ladell Pier, MD  An Emmi alert was received indicating patient responded to questions: Went to follow-up appointment?.  I reached out by phone to follow up on the alert and spoke to Patient.  Patient confirms she has PCP follow up appt for next week-12/26/21. She also has neuro appt scheduled for Oct. She voices that she uses Medicaid transportation. She is aware that she needs to call in advance to arrange transport and will do so today. Denies any other RN CM needs or concerns at this time.   Follow up plan:  No further intervention needed at this time. Patient has completed post discharge automated EMMI-Stroke calls.    Enzo Montgomery, RN,BSN,CCM [place] Management Telephonic Care Management Coordinator Direct Phone: [phone] Toll Free: [phone] Fax: [phone]

## 2021-12-24 ENCOUNTER — Encounter (HOSPITAL_COMMUNITY): Payer: Self-pay

## 2021-12-24 ENCOUNTER — Other Ambulatory Visit: Payer: Self-pay

## 2021-12-24 ENCOUNTER — Inpatient Hospital Stay (HOSPITAL_COMMUNITY)
Admission: EM | Admit: 2021-12-24 | Discharge: 2021-12-28 | DRG: 378 | Disposition: A | Discharge status: Home / Self Care | Payer: Medicaid Other | Attending: Internal Medicine | Admitting: Internal Medicine

## 2021-12-24 ENCOUNTER — Observation Stay (HOSPITAL_COMMUNITY): Payer: Medicaid Other

## 2021-12-24 ENCOUNTER — Emergency Department (HOSPITAL_COMMUNITY): Payer: Medicaid Other

## 2021-12-24 DIAGNOSIS — K921 Melena: Secondary | ICD-10-CM | POA: Diagnosis not present

## 2021-12-24 DIAGNOSIS — E039 Hypothyroidism, unspecified: Secondary | ICD-10-CM | POA: Diagnosis present

## 2021-12-24 DIAGNOSIS — Z8673 Personal history of transient ischemic attack (TIA), and cerebral infarction without residual deficits: Secondary | ICD-10-CM

## 2021-12-24 DIAGNOSIS — Z8639 Personal history of other endocrine, nutritional and metabolic disease: Secondary | ICD-10-CM

## 2021-12-24 DIAGNOSIS — D62 Acute posthemorrhagic anemia: Secondary | ICD-10-CM | POA: Diagnosis present

## 2021-12-24 DIAGNOSIS — E861 Hypovolemia: Secondary | ICD-10-CM | POA: Diagnosis present

## 2021-12-24 DIAGNOSIS — F17210 Nicotine dependence, cigarettes, uncomplicated: Secondary | ICD-10-CM | POA: Diagnosis present

## 2021-12-24 DIAGNOSIS — Z880 Allergy status to penicillin: Secondary | ICD-10-CM

## 2021-12-24 DIAGNOSIS — I10 Essential (primary) hypertension: Secondary | ICD-10-CM | POA: Diagnosis not present

## 2021-12-24 DIAGNOSIS — I69351 Hemiplegia and hemiparesis following cerebral infarction affecting right dominant side: Secondary | ICD-10-CM

## 2021-12-24 DIAGNOSIS — N179 Acute kidney failure, unspecified: Secondary | ICD-10-CM | POA: Diagnosis not present

## 2021-12-24 DIAGNOSIS — Z7982 Long term (current) use of aspirin: Secondary | ICD-10-CM

## 2021-12-24 DIAGNOSIS — Z7902 Long term (current) use of antithrombotics/antiplatelets: Secondary | ICD-10-CM | POA: Diagnosis not present

## 2021-12-24 DIAGNOSIS — Z79890 Hormone replacement therapy (postmenopausal): Secondary | ICD-10-CM

## 2021-12-24 DIAGNOSIS — K5521 Angiodysplasia of colon with hemorrhage: Secondary | ICD-10-CM

## 2021-12-24 DIAGNOSIS — M79601 Pain in right arm: Secondary | ICD-10-CM | POA: Diagnosis present

## 2021-12-24 DIAGNOSIS — I639 Cerebral infarction, unspecified: Secondary | ICD-10-CM

## 2021-12-24 DIAGNOSIS — F1410 Cocaine abuse, uncomplicated: Secondary | ICD-10-CM | POA: Diagnosis present

## 2021-12-24 DIAGNOSIS — K922 Gastrointestinal hemorrhage, unspecified: Secondary | ICD-10-CM | POA: Diagnosis not present

## 2021-12-24 DIAGNOSIS — Z79899 Other long term (current) drug therapy: Secondary | ICD-10-CM

## 2021-12-24 DIAGNOSIS — Q438 Other specified congenital malformations of intestine: Secondary | ICD-10-CM

## 2021-12-24 DIAGNOSIS — E785 Hyperlipidemia, unspecified: Secondary | ICD-10-CM | POA: Diagnosis present

## 2021-12-24 DIAGNOSIS — F1910 Other psychoactive substance abuse, uncomplicated: Secondary | ICD-10-CM | POA: Diagnosis present

## 2021-12-24 DIAGNOSIS — E890 Postprocedural hypothyroidism: Secondary | ICD-10-CM

## 2021-12-24 DIAGNOSIS — Z881 Allergy status to other antibiotic agents status: Secondary | ICD-10-CM

## 2021-12-24 LAB — CBC WITH DIFFERENTIAL/PLATELET
Abs Immature Granulocytes: 0.04 10*3/uL (ref 0.00–0.07)
Basophils Absolute: 0 10*3/uL (ref 0.0–0.1)
Basophils Relative: 0 %
Eosinophils Absolute: 0.1 10*3/uL (ref 0.0–0.5)
Eosinophils Relative: 1 %
HCT: 20.4 % — ABNORMAL LOW (ref 36.0–46.0)
Hemoglobin: 6.4 g/dL — CL (ref 12.0–15.0)
Immature Granulocytes: 1 %
Lymphocytes Relative: 9 %
Lymphs Abs: 0.7 10*3/uL (ref 0.7–4.0)
MCH: 29.5 pg (ref 26.0–34.0)
MCHC: 31.4 g/dL (ref 30.0–36.0)
MCV: 94 fL (ref 80.0–100.0)
Monocytes Absolute: 0.4 10*3/uL (ref 0.1–1.0)
Monocytes Relative: 5 %
Neutro Abs: 6.6 10*3/uL (ref 1.7–7.7)
Neutrophils Relative %: 84 %
Platelets: 272 10*3/uL (ref 150–400)
RBC: 2.17 MIL/uL — ABNORMAL LOW (ref 3.87–5.11)
RDW: 14.9 % (ref 11.5–15.5)
WBC: 7.9 10*3/uL (ref 4.0–10.5)
nRBC: 0 % (ref 0.0–0.2)

## 2021-12-24 LAB — I-STAT CHEM 8, ED
BUN: 12 mg/dL (ref 6–20)
Calcium, Ion: 1.15 mmol/L (ref 1.15–1.40)
Chloride: 103 mmol/L (ref 98–111)
Creatinine, Ser: 1.4 mg/dL — ABNORMAL HIGH (ref 0.44–1.00)
Glucose, Bld: 119 mg/dL — ABNORMAL HIGH (ref 70–99)
HCT: 21 % — ABNORMAL LOW (ref 36.0–46.0)
Hemoglobin: 7.1 g/dL — ABNORMAL LOW (ref 12.0–15.0)
Potassium: 3.6 mmol/L (ref 3.5–5.1)
Sodium: 138 mmol/L (ref 135–145)
TCO2: 23 mmol/L (ref 22–32)

## 2021-12-24 LAB — CBC
HCT: 18.8 % — ABNORMAL LOW (ref 36.0–46.0)
Hemoglobin: 5.8 g/dL — CL (ref 12.0–15.0)
MCH: 29.3 pg (ref 26.0–34.0)
MCHC: 30.9 g/dL (ref 30.0–36.0)
MCV: 94.9 fL (ref 80.0–100.0)
Platelets: 268 10*3/uL (ref 150–400)
RBC: 1.98 MIL/uL — ABNORMAL LOW (ref 3.87–5.11)
RDW: 14.9 % (ref 11.5–15.5)
WBC: 7.6 10*3/uL (ref 4.0–10.5)
nRBC: 0 % (ref 0.0–0.2)

## 2021-12-24 LAB — COMPREHENSIVE METABOLIC PANEL
ALT: 15 U/L (ref 0–44)
AST: 21 U/L (ref 15–41)
Albumin: 3.1 g/dL — ABNORMAL LOW (ref 3.5–5.0)
Alkaline Phosphatase: 48 U/L (ref 38–126)
Anion gap: 7 (ref 5–15)
BUN: 11 mg/dL (ref 6–20)
CO2: 23 mmol/L (ref 22–32)
Calcium: 8.6 mg/dL — ABNORMAL LOW (ref 8.9–10.3)
Chloride: 108 mmol/L (ref 98–111)
Creatinine, Ser: 1.32 mg/dL — ABNORMAL HIGH (ref 0.44–1.00)
GFR, Estimated: 47 mL/min — ABNORMAL LOW (ref >60)
Glucose, Bld: 122 mg/dL — ABNORMAL HIGH (ref 70–99)
Potassium: 3.6 mmol/L (ref 3.5–5.1)
Sodium: 138 mmol/L (ref 135–145)
Total Bilirubin: 0.6 mg/dL (ref 0.3–1.2)
Total Protein: 7.1 g/dL (ref 6.5–8.1)

## 2021-12-24 LAB — URINALYSIS, ROUTINE W REFLEX MICROSCOPIC
Bilirubin Urine: NEGATIVE
Glucose, UA: NEGATIVE mg/dL
Hgb urine dipstick: NEGATIVE
Ketones, ur: NEGATIVE mg/dL
Leukocytes,Ua: NEGATIVE
Nitrite: NEGATIVE
Protein, ur: NEGATIVE mg/dL
Specific Gravity, Urine: <1.005 — ABNORMAL LOW (ref 1.005–1.030)
pH: 5.5 (ref 5.0–8.0)

## 2021-12-24 LAB — DIFFERENTIAL
Abs Immature Granulocytes: 0.04 10*3/uL (ref 0.00–0.07)
Basophils Absolute: 0 10*3/uL (ref 0.0–0.1)
Basophils Relative: 0 %
Eosinophils Absolute: 0.1 10*3/uL (ref 0.0–0.5)
Eosinophils Relative: 1 %
Immature Granulocytes: 1 %
Lymphocytes Relative: 11 %
Lymphs Abs: 0.9 10*3/uL (ref 0.7–4.0)
Monocytes Absolute: 0.4 10*3/uL (ref 0.1–1.0)
Monocytes Relative: 5 %
Neutro Abs: 6.2 10*3/uL (ref 1.7–7.7)
Neutrophils Relative %: 82 %

## 2021-12-24 LAB — I-STAT BETA HCG BLOOD, ED (MC, WL, AP ONLY): I-stat hCG, quantitative: <5 m[IU]/mL (ref <5)

## 2021-12-24 LAB — RAPID URINE DRUG SCREEN, HOSP PERFORMED
Amphetamines: NOT DETECTED
Barbiturates: NOT DETECTED
Benzodiazepines: NOT DETECTED
Cocaine: POSITIVE — AB
Opiates: NOT DETECTED
Tetrahydrocannabinol: POSITIVE — AB

## 2021-12-24 LAB — PROTIME-INR
INR: 1.1 (ref 0.8–1.2)
Prothrombin Time: 13.9 seconds (ref 11.4–15.2)

## 2021-12-24 LAB — PREPARE RBC (CROSSMATCH)

## 2021-12-24 LAB — ETHANOL: Alcohol, Ethyl (B): <10 mg/dL (ref <10)

## 2021-12-24 LAB — POC OCCULT BLOOD, ED: Fecal Occult Bld: POSITIVE — AB

## 2021-12-24 LAB — HEMOGLOBIN AND HEMATOCRIT, BLOOD
HCT: 26.7 % — ABNORMAL LOW (ref 36.0–46.0)
Hemoglobin: 8.4 g/dL — ABNORMAL LOW (ref 12.0–15.0)

## 2021-12-24 LAB — ABO/RH: ABO/RH(D): O POS

## 2021-12-24 LAB — CBG MONITORING, ED: Glucose-Capillary: 120 mg/dL — ABNORMAL HIGH (ref 70–99)

## 2021-12-24 LAB — APTT: aPTT: 32 seconds (ref 24–36)

## 2021-12-24 MED ORDER — LABETALOL HCL 200 MG PO TABS: 200.0000 mg | ORAL_TABLET | Freq: Two times a day (BID) | ORAL | Status: DC

## 2021-12-24 MED ORDER — ACETAMINOPHEN 325 MG PO TABS
650.0000 mg | ORAL_TABLET | Freq: Four times a day (QID) | ORAL | Status: DC | PRN
  Administered 2021-12-26 – 2021-12-28 (×4): 650 mg via ORAL
  Filled 2021-12-24 (×4): qty 2

## 2021-12-24 MED ORDER — PANTOPRAZOLE SODIUM 40 MG IV SOLR
40.0000 mg | Freq: Once | INTRAVENOUS | Status: AC
  Administered 2021-12-24: 40 mg via INTRAVENOUS
  Filled 2021-12-24: qty 10

## 2021-12-24 MED ORDER — ONDANSETRON HCL 4 MG/2ML IJ SOLN: 4.0000 mg | Freq: Four times a day (QID) | INTRAMUSCULAR | Status: DC | PRN

## 2021-12-24 MED ORDER — SODIUM CHLORIDE 0.9 % IV SOLN
10.0000 mL/h | Freq: Once | INTRAVENOUS | Status: AC
  Administered 2021-12-24: 10 mL/h via INTRAVENOUS

## 2021-12-24 MED ORDER — LEVOTHYROXINE SODIUM 25 MCG PO TABS: 137.0000 ug | ORAL_TABLET | Freq: Every day | ORAL | Status: DC

## 2021-12-24 MED ORDER — LORAZEPAM 2 MG/ML IJ SOLN
0.5000 mg | Freq: Once | INTRAMUSCULAR | Status: AC | PRN
  Administered 2021-12-24: 0.5 mg via INTRAVENOUS
  Filled 2021-12-24: qty 1

## 2021-12-24 MED ORDER — AMLODIPINE BESYLATE 5 MG PO TABS: 5.0000 mg | ORAL_TABLET | Freq: Every day | ORAL | Status: DC

## 2021-12-24 MED ORDER — SODIUM CHLORIDE 0.9% FLUSH
3.0000 mL | Freq: Two times a day (BID) | INTRAVENOUS | Status: DC
  Administered 2021-12-24 – 2021-12-27 (×5): 3 mL via INTRAVENOUS

## 2021-12-24 MED ORDER — ACETAMINOPHEN 650 MG RE SUPP: 650.0000 mg | Freq: Four times a day (QID) | RECTAL | Status: DC | PRN

## 2021-12-24 MED ORDER — SODIUM CHLORIDE 0.9% FLUSH
3.0000 mL | Freq: Once | INTRAVENOUS | Status: AC
  Administered 2021-12-24: 3 mL via INTRAVENOUS

## 2021-12-24 MED ORDER — PANTOPRAZOLE SODIUM 40 MG IV SOLR
40.0000 mg | Freq: Two times a day (BID) | INTRAVENOUS | Status: DC
  Administered 2021-12-24 – 2021-12-25 (×2): 40 mg via INTRAVENOUS
  Filled 2021-12-24 (×2): qty 10

## 2021-12-24 MED ORDER — SODIUM CHLORIDE 0.9 % IV SOLN: INTRAVENOUS | Status: AC

## 2021-12-24 MED ORDER — ALBUTEROL SULFATE (2.5 MG/3ML) 0.083% IN NEBU: 2.5000 mg | INHALATION_SOLUTION | Freq: Four times a day (QID) | RESPIRATORY_TRACT | Status: DC | PRN

## 2021-12-24 MED ORDER — ATORVASTATIN CALCIUM 40 MG PO TABS
40.0000 mg | ORAL_TABLET | Freq: Every day | ORAL | Status: DC
  Administered 2021-12-26 – 2021-12-28 (×3): 40 mg via ORAL
  Filled 2021-12-24 (×3): qty 1

## 2021-12-24 NOTE — ED Provider Notes (Addendum)
[facility] EMERGENCY DEPARTMENT Provider Note   CSN: [id number] Arrival date & time: 12/24/21  1607     History  Chief Complaint  Patient presents with   Numbness    Ann Jarvis is a 56 y.o. female.  HPI Patient presents with right-sided weakness and numbness.  Has had for the last couple days.  Had a recent stroke around 2 weeks ago.  Had been started on aspirin and Plavix.  States she has been doing better but now feels that the right arm is doing worse.  States it feels tight.  She is right-handed.  States she is also started having black stool.  No blood.  No nausea or vomiting.  No abdominal pain.  Has not had known bleeding before but states she has had gastritis.   Past Medical History:  Diagnosis Date   Cocaine abuse ([place])    Grave's disease    s/p radioiodine ablation 03/05/10, F/B Dr. Carlis Abbott. On Methimazole   Hypertension    Scabies    Tobacco abuse     Home Medications Prior to Admission medications   Medication Sig Start Date End Date Taking? Authorizing Provider  amLODipine (NORVASC) 5 MG tablet Take 1 tablet (5 mg total) by mouth daily. 12/06/21 03/06/22  Pokhrel, Corrie Mckusick, MD  aspirin EC 81 MG tablet Take 1 tablet (81 mg total) by mouth daily. Swallow whole. 12/06/21   Pokhrel, Corrie Mckusick, MD  atorvastatin (LIPITOR) 40 MG tablet Take 1 tablet (40 mg total) by mouth daily. 12/06/21 03/06/22  Pokhrel, Corrie Mckusick, MD  Chlorphen-PE-Acetaminophen (TYLENOL ALLERGY MULTI-SYMPTOM PO) Take 2 tablets by mouth every 6 (six) hours as needed (cold symptoms).    [provider]  clopidogrel (PLAVIX) 75 MG tablet Take 1 tablet (75 mg total) by mouth daily for 21 days. 12/06/21 12/27/21  Pokhrel, Corrie Mckusick, MD  folic acid (FOLVITE) 1 MG tablet Take 1 tablet (1 mg total) by mouth daily. 12/06/21   Pokhrel, Corrie Mckusick, MD  hydrochlorothiazide (HYDRODIURIL) 25 MG tablet Take 1 tablet (25 mg total) by mouth daily. MUST KEEP APPT 09/2021 for further refills 09/25/21   Ladell Pier, MD  labetalol (NORMODYNE) 200 MG tablet Take 1 tablet (200 mg total) by mouth 2 (two) times daily. 12/05/21 12/05/22  Pokhrel, Corrie Mckusick, MD  levothyroxine (SYNTHROID) 137 MCG tablet Take 1 tablet (137 mcg total) by mouth daily before breakfast. 02/27/21   Ladell Pier, MD  losartan (COZAAR) 100 MG tablet Take 1 tablet (100 mg total) by mouth daily. Must keep appt 09/2021 for further refills 09/25/21   Ladell Pier, MD  thiamine (VITAMIN B-1) 100 MG tablet Take 1 tablet (100 mg total) by mouth daily. 12/06/21   Pokhrel, Corrie Mckusick, MD      Allergies    Ciprofloxacin, Flagyl [metronidazole], and Penicillins    Review of Systems   Review of Systems  Physical Exam Updated Vital Signs BP 128/72 (BP Location: Left Arm)   Pulse (!) 55   Temp 98.2 F (36.8 C) (Oral)   Resp 18   Ht 5\' 11"  (1.803 m)   Wt 64.4 kg   LMP 10/07/2015   SpO2 100%   BMI 19.80 kg/m  Physical Exam Vitals and nursing note reviewed.  Cardiovascular:     Rate and Rhythm: Regular rhythm.  Pulmonary:     Breath sounds: No wheezing.  Abdominal:     Tenderness: There is no abdominal tenderness.  Genitourinary:    Rectum: Guaiac result positive.     Comments:  Black stool on exam. Musculoskeletal:     Cervical back: Neck supple.  Skin:    Coloration: Skin is pale.  Neurological:     Mental Status: She is alert.     Comments: Face symmetric.  Mildly decreased strength and sensation on the right side compared to left.  Particularly on the right upper extremity.     ED Results / Procedures / Treatments   Labs (all labs ordered are listed, but only abnormal results are displayed) Labs Reviewed  CBC - Abnormal; Notable for the following components:      Result Value   RBC 1.98 (*)    Hemoglobin 5.8 (*)    HCT 18.8 (*)    All other components within normal limits  COMPREHENSIVE METABOLIC PANEL - Abnormal; Notable for the following components:   Glucose, Bld 122 (*)    Creatinine, Ser 1.32 (*)    Calcium  8.6 (*)    Albumin 3.1 (*)    GFR, Estimated 47 (*)    All other components within normal limits  CBC WITH DIFFERENTIAL/PLATELET - Abnormal; Notable for the following components:   RBC 2.17 (*)    Hemoglobin 6.4 (*)    HCT 20.4 (*)    All other components within normal limits  I-STAT CHEM 8, ED - Abnormal; Notable for the following components:   Creatinine, Ser 1.40 (*)    Glucose, Bld 119 (*)    Hemoglobin 7.1 (*)    HCT 21.0 (*)    All other components within normal limits  CBG MONITORING, ED - Abnormal; Notable for the following components:   Glucose-Capillary 120 (*)    All other components within normal limits  POC OCCULT BLOOD, ED - Abnormal; Notable for the following components:   Fecal Occult Bld POSITIVE (*)    All other components within normal limits  PROTIME-INR  APTT  DIFFERENTIAL  ETHANOL  RAPID URINE DRUG SCREEN, HOSP PERFORMED  CBC WITH DIFFERENTIAL/PLATELET  I-STAT BETA HCG BLOOD, ED ([facility], [facility], [facility] ONLY)  TYPE AND SCREEN  PREPARE RBC (CROSSMATCH)  ABO/RH    EKG None  Radiology CT HEAD WO CONTRAST  Result Date: 12/24/2021 CLINICAL DATA:  Neuro deficit, acute, stroke suspected EXAM: CT HEAD WITHOUT CONTRAST TECHNIQUE: Contiguous axial images were obtained from the base of the skull through the vertex without intravenous contrast. RADIATION DOSE REDUCTION: This exam was performed according to the departmental dose-optimization program which includes automated exposure control, adjustment of the mA and/or kV according to patient size and/or use of iterative reconstruction technique. COMPARISON:  CT head and MRI head 12/03/2021. FINDINGS: Brain: Left thalamic infarct better characterized on prior MRI. Otherwise, no evidence of acute large vascular territory infarct, acute hemorrhage, hydrocephalus, mass lesion or abnormal mass effect. Patchy white matter hypodensities, nonspecific but compatible with chronic microvascular ischemic disease. Vascular: No hyperdense  vessel identified. Skull: No acute fracture. Sinuses/Orbits: Clear visualized sinuses. No acute orbital findings. Other: Pneumatosis a fusions. IMPRESSION: 1. Left thalamic infarct better characterized on prior MRI. 2. Otherwise, no evidence acute intracranial abnormality. Electronically Signed   By: Feliberto Harts M.D.   On: 12/24/2021 08:24    Procedures Procedures    Medications Ordered in ED Medications  sodium chloride flush (NS) 0.9 % injection 3 mL (has no administration in time range)  pantoprazole (PROTONIX) injection 40 mg (has no administration in time range)  0.9 %  sodium chloride infusion (has no administration in time range)  LORazepam (ATIVAN) injection 0.5 mg (has no administration  in time range)    ED Course/ Medical Decision Making/ A&P                           Medical Decision Making Amount and/or Complexity of Data Reviewed Labs: ordered. Radiology: ordered.  Risk Prescription drug management.   Patient presents with worsening right-sided neurodeficits.  Recent stroke.  On aspirin and Plavix.  Had blood work shows an rather severe anemia.  Hemoglobin down to about 7.  Was on 13 at recent admission.  Has had black stool and I think this most likely GI bleed.  Likely upper GI bleed with black stool.  Hemodynamically appears stable at this time.  Benign abdominal exam.  Will start IV Protonix.  Will consult gastroenterology.  [facility] has been notified since she has not seen GI in the past.  Head CT done and showed old stroke.  We will discuss with neurology about both work-up for the new deficits and her antiplatelet therapy.  Will admit to internal medicine.  Will transfuse 2 units at this time. Potentially does have worsening of the previous stroke.  Could just be coming out from the anemia.  Not a tPA candidate due to recent stroke with continued deficits and with the GI bleed.  CRITICAL CARE Performed by: Benjiman Core Total critical care time: 30  minutes Critical care time was exclusive of separately billable procedures and treating other patients. Critical care was necessary to treat or prevent imminent or life-threatening deterioration. Critical care was time spent personally by me on the following activities: development of treatment plan with patient and/or surrogate as well as nursing, discussions with consultants, evaluation of patient's response to treatment, examination of patient, obtaining history from patient or surrogate, ordering and performing treatments and interventions, ordering and review of laboratory studies, ordering and review of radiographic studies, pulse oximetry and re-evaluation of patient's condition.  Discussed with Dr.Khaliqdina from neurology.  Can stop both antiplatelets for now.  Can go back on only aspirin after once cleared by GI.  Recommend getting MRI.  Does not necessarily need a neurology consult at this time.  If MRI is positive for new stroke or if medicine wants consult he can be contacted.         Final Clinical Impression(s) / ED Diagnoses Final diagnoses:  Upper GI bleeding  Cerebrovascular accident (CVA), unspecified mechanism [facility])    Rx / DC Orders ED Discharge Orders     None         Benjiman Core, MD 12/24/21 1028    Benjiman Core, MD 12/24/21 1050

## 2021-12-24 NOTE — ED Notes (Signed)
To MRI

## 2021-12-24 NOTE — H&P (Signed)
History and Physical    Patient: Ann Jarvis ZOX:[id number] DOB: 12-24-1965 DOA: 12/24/2021 DOS: the patient was seen and examined on 12/24/2021 PCP: Marcine Matar, MD  Patient coming from: Home  Chief Complaint:  Chief Complaint  Patient presents with   Numbness   HPI: Ann Jarvis is a 56 y.o. female with medical history significant of hypertension, CVA, Graves' disease s/p RAI ablation 2011, tobacco abuse, and cocaine abuse who presents with complaints of right arm pain for the last 2 to 4 days.  She reports having achy pain from her fingers all the way into her shoulder on the right arm.  Reported similar pain during recent hospitalization from 9/4-9/6 when she was found to found to have a left thalamic infarct.  She is evaluated by neurology and started on aspirin and Plavix.  Initially at discharge pain symptoms had improved, but still reported having difficulty picking up certain things with her right hand.  Over the last week patient noticed that she was having runny black stools.  Reported associated symptoms of shortness of breath with exertion and chest pressure.  Denies any abdominal pain and she has not used any NSAIDs.  The denies having any recent headache, change in vision, change in speech, falls, nausea, or vomiting.  She did admit to mild using cocaine yesterday.  Patient smokes cigarettes intermittently, but denies drinking alcohol on a daily basis.  She has never had a colonoscopy previously before.  Upon admission into the emergency department patient was noted to be afebrile with pulse 55-66, blood pressures elevated up to 163/86, and all other vital signs maintained.  CT scan of the brain showed old left thalamic infarct.  Labs significant for hemoglobin 5.8->6.4, BUN 11, and creatinine 1.32.  Stool were noted to be melanotic and positive for blood.  Patient had been typed and screened and ordered to be transfused 2 units of packed red blood cells.  GI had  formally been consulted and case had been discussed with neurology who recommended obtaining MRI.  Patient had also been given 40 mg of Protonix IV.  Review of Systems: As mentioned in the history of present illness. All other systems reviewed and are negative. Past Medical History:  Diagnosis Date   Cocaine abuse ([facility])    Grave's disease    s/p radioiodine ablation 03/05/10, F/B Dr. Chestine Spore. On Methimazole   Hypertension    Scabies    Tobacco abuse    Past Surgical History:  Procedure Laterality Date   BTL     MOUTH SURGERY     Social History:  reports that she has been smoking cigarettes. She has been smoking an average of .25 packs per day. She has never used smokeless tobacco. She reports current alcohol use of about 1.0 - 2.0 standard drink of alcohol per week. She reports current drug use. Drugs: Marijuana and Cocaine.  Allergies  Allergen Reactions   Ciprofloxacin Rash    Significant rash, taking with Flagyl   Flagyl [Metronidazole] Rash    Developed significant rash while taking with Cipro   Penicillins Swelling    Did it involve swelling of the face/tongue/throat, SOB, or low BP? No Did it involve sudden or severe rash/hives, skin peeling, or any reaction on the inside of your mouth or nose? No Did you need to seek medical attention at a hospital or doctor's office? No When did it last happen?  Pt states a long time ago     If all above answers are "NO",  may proceed with cephalosporin use.     History reviewed. No pertinent family history.  Prior to Admission medications   Medication Sig Start Date End Date Taking? Authorizing Provider  amLODipine (NORVASC) 5 MG tablet Take 1 tablet (5 mg total) by mouth daily. 12/06/21 03/06/22  Pokhrel, Corrie Mckusick, MD  aspirin EC 81 MG tablet Take 1 tablet (81 mg total) by mouth daily. Swallow whole. 12/06/21   Pokhrel, Corrie Mckusick, MD  atorvastatin (LIPITOR) 40 MG tablet Take 1 tablet (40 mg total) by mouth daily. 12/06/21 03/06/22  Pokhrel, Corrie Mckusick,  MD  Chlorphen-PE-Acetaminophen (TYLENOL ALLERGY MULTI-SYMPTOM PO) Take 2 tablets by mouth every 6 (six) hours as needed (cold symptoms).    [provider]  clopidogrel (PLAVIX) 75 MG tablet Take 1 tablet (75 mg total) by mouth daily for 21 days. 12/06/21 12/27/21  Pokhrel, Corrie Mckusick, MD  folic acid (FOLVITE) 1 MG tablet Take 1 tablet (1 mg total) by mouth daily. 12/06/21   Pokhrel, Corrie Mckusick, MD  hydrochlorothiazide (HYDRODIURIL) 25 MG tablet Take 1 tablet (25 mg total) by mouth daily. MUST KEEP APPT 09/2021 for further refills 09/25/21   Ladell Pier, MD  labetalol (NORMODYNE) 200 MG tablet Take 1 tablet (200 mg total) by mouth 2 (two) times daily. 12/05/21 12/05/22  Pokhrel, Corrie Mckusick, MD  levothyroxine (SYNTHROID) 137 MCG tablet Take 1 tablet (137 mcg total) by mouth daily before breakfast. 02/27/21   Ladell Pier, MD  losartan (COZAAR) 100 MG tablet Take 1 tablet (100 mg total) by mouth daily. Must keep appt 09/2021 for further refills 09/25/21   Ladell Pier, MD  thiamine (VITAMIN B-1) 100 MG tablet Take 1 tablet (100 mg total) by mouth daily. 12/06/21   Flora Lipps, MD    Physical Exam: Vitals:   12/24/21 0714 12/24/21 0830 12/24/21 1104 12/24/21 1141  BP:  128/72 (!) 149/76 (!) 163/86  Pulse:  (!) 55 66 63  Resp:  18 20 18   Temp:  98.2 F (36.8 C) 98.4 F (36.9 C) 98.4 F (36.9 C)  TempSrc:  Oral Oral Oral  SpO2:  100% 98% 99%  Weight: 64.4 kg     Height: 5\' 11"  (1.803 m)        Constitutional: Middle-aged female who appears to be in no acute distress at this time Eyes: PERRL, lids and conjunctivae normal ENMT: Mucous membranes are moist.  Edentulous Neck: normal, supple, no masses, no thyromegaly Respiratory: clear to auscultation bilaterally, no wheezing, no crackles. Normal respiratory effort.   Cardiovascular: Regular rate and rhythm, no murmurs / rubs / gallops. No extremity edema.   Abdomen: no tenderness, no masses palpated.   Bowel sounds positive.   Musculoskeletal: no clubbing / cyanosis. No joint deformity upper and lower extremities. Good ROM, no contractures. Normal muscle tone.  Skin: no rashes, lesions, ulcers. No induration Neurologic: CN 2-12 grossly intact.  Decreased sensation on the right upper extremity.  Strength 4+/5 on the right upper extremity.  Extremity appears to be 5/5 in all other extremities. Psychiatric: Normal judgment and insight. Alert and oriented x 3. Normal mood.   Data Reviewed:    Assessment and Plan: Acute blood loss anemia secondary to GI bleed Patient hemoglobin noted to be 5.8->6.4 today, but previously had been 12.7 on 9/4.  Stool guaiacs noted to be positive.  Patient had recently been started on aspirin and Plavix due to CVA.  She was typed and screened and ordered 2 units of packed red blood cells.  Unclear if symptoms are secondary  to upper or lower source at this time. -Admit to a telemetry bed -Hold aspirin and Plavix -Continue with transfusion of 2 units of PRBCs -Serial H&H -Transfuse blood products as needed maintain hemoglobin at least 8 g/dL -Appreciate GI consultative services, we will follow-up for any further recommendation  Recent CVA Patient just recently had left thalamic infarct with residual right-sided weakness.  She presents with worsening achy pain in the right arm.  CT showed prior thalamic infarct.  Neurology recommended holding both checking repeat MRI and to formally consult if positive. -Neuro checks -Follow-up MRI -Continue statin -Neurology recommended resuming aspirin once cleared by gastroenterology  Acute kidney injury Creatinine on admission 1.32 with BUN 11.  Baseline creatinine previously around 1. This is greater than a 0.3 increased from baseline -Check urinalysis -Normal saline IV fluids at 100 mL/h x 1 L -Check kidney function in am  Polysubstance abuse Patient admitted to recently using cocaine yesterday.  Patient reports intermittently smoking  cigarettes and does drink alcohol intermittently, but did not report doing so on a daily basis. -Continue counseling on need of cocaine abuse -TOC consulted for substance abuse  Hypothyroidism Patient with prior history of Graves' disease status post RAI ablation in 2011.  TSH last was 7.103 on 9/5. -Check TSH -Continue levothyroxine  Essential hypertension Blood pressures initially elevated up to 169/98.  Home medication regimen includes home blood pressure regimen includes amlodipine 5 mg daily and labetalol 200 mg twice daily -Plan to resume home blood pressure regimen if no signs of stroke noted on MRI.  Hyperlipidemia -Continue atorvastatin   DVT prophylaxis: SCDs Advance Care Planning:   Code Status: Full Code   Consults: Gastroenterology  Family Communication: daughter updated over the phone  Severity of Illness: The appropriate patient status for this patient is OBSERVATION. Observation status is judged to be reasonable and necessary in order to provide the required intensity of service to ensure the patient's safety. The patient's presenting symptoms, physical exam findings, and initial radiographic and laboratory data in the context of their medical condition is felt to place them at decreased risk for further clinical deterioration. Furthermore, it is anticipated that the patient will be medically stable for discharge from the hospital within 2 midnights of admission.   Author: Clydie Braun, MD 12/24/2021 12:00 PM  For on call review [email or web address].

## 2021-12-24 NOTE — ED Notes (Signed)
MD Shalhoub notified of pt HR bradycardic in the mid to upper 50s - Okay to hold Labetalol at this time

## 2021-12-24 NOTE — ED Notes (Signed)
Labs to be collected around 2030 - Blood transfusion ended at [place]

## 2021-12-24 NOTE — Consult Note (Addendum)
Consultation  Referring Provider: ER MD/ Rubin Payor Primary Care Physician:  Marcine Matar, MD Primary Gastroenterologist:  none/ unassigned  Reason for Consultation:  Melena  anemia  HPI: Ann Jarvis is a 56 y.o. female, who presented to the emergency room earlier this morning with complaints of increase in right-sided numbness over the past 2 days. She had a very recent admission and was discharged on 12/05/2021 after an acute left thalamic CVA.  She was discharged on aspirin and Plavix. Patient does have history of hypertension, cocaine abuse, Graves' disease status post previous RAI ablation.  She also admits to drinking alcohol fairly regularly though not daily This morning she says she has also been feeling somewhat short of breath with exertion and has had some chest pressure over the past couple of days.  She denies any heartburn or indigestion, no dysphagia, no nausea or vomiting, no abdominal pain. Hemodynamically stable on arrival. Initial labs show hemoglobin of 5.8/hematocrit of 18 down from a hemoglobin of 12.7 on 12/03/2021. Patient says she has been noticing runny black stools at home over the past week, no obvious blood. She denies any NSAID use, denies BCs or Goody's but does admit to using cocaine as recently as yesterday.  Prior history of GI bleeding.  No prior GI evaluation.  History of liver disease that she is aware of.  CT of the head this morning shows the left thalamic infarct, otherwise no acute abnormality.  MRI has been ordered. Stool noted to be black and melenic per ER MD  Labs-EtOH negative WBC 7.6/hemoglobin 5.8/hematocrit 18.8/MCV 94/platelets 268 INR 1.1 Drug screen pending BUN 11/creatinine 1.32 LFT's  within normal limits  She is to be transfused 2 units of packed RBCs  Past Medical History:  Diagnosis Date   Cocaine abuse (HCC)    Grave's disease    s/p radioiodine ablation 03/05/10, F/B Dr. Chestine Spore. On Methimazole   Hypertension     Scabies    Tobacco abuse     Past Surgical History:  Procedure Laterality Date   BTL     MOUTH SURGERY      Prior to Admission medications   Medication Sig Start Date End Date Taking? Authorizing Provider  amLODipine (NORVASC) 5 MG tablet Take 1 tablet (5 mg total) by mouth daily. 12/06/21 03/06/22  Pokhrel, Rebekah Chesterfield, MD  aspirin EC 81 MG tablet Take 1 tablet (81 mg total) by mouth daily. Swallow whole. 12/06/21   Pokhrel, Rebekah Chesterfield, MD  atorvastatin (LIPITOR) 40 MG tablet Take 1 tablet (40 mg total) by mouth daily. 12/06/21 03/06/22  Pokhrel, Rebekah Chesterfield, MD  Chlorphen-PE-Acetaminophen (TYLENOL ALLERGY MULTI-SYMPTOM PO) Take 2 tablets by mouth every 6 (six) hours as needed (cold symptoms).    [provider]  clopidogrel (PLAVIX) 75 MG tablet Take 1 tablet (75 mg total) by mouth daily for 21 days. 12/06/21 12/27/21  Pokhrel, Rebekah Chesterfield, MD  folic acid (FOLVITE) 1 MG tablet Take 1 tablet (1 mg total) by mouth daily. 12/06/21   Pokhrel, Rebekah Chesterfield, MD  hydrochlorothiazide (HYDRODIURIL) 25 MG tablet Take 1 tablet (25 mg total) by mouth daily. MUST KEEP APPT 09/2021 for further refills 09/25/21   Marcine Matar, MD  labetalol (NORMODYNE) 200 MG tablet Take 1 tablet (200 mg total) by mouth 2 (two) times daily. 12/05/21 12/05/22  Pokhrel, Rebekah Chesterfield, MD  levothyroxine (SYNTHROID) 137 MCG tablet Take 1 tablet (137 mcg total) by mouth daily before breakfast. 02/27/21   Marcine Matar, MD  losartan (COZAAR) 100 MG tablet Take 1  tablet (100 mg total) by mouth daily. Must keep appt 09/2021 for further refills 09/25/21   Ladell Pier, MD  thiamine (VITAMIN B-1) 100 MG tablet Take 1 tablet (100 mg total) by mouth daily. 12/06/21   Pokhrel, Corrie Mckusick, MD    Current Facility-Administered Medications  Medication Dose Route Frequency Provider Last Rate Last Admin   0.9 %  sodium chloride infusion  10 mL/hr Intravenous Once Davonna Belling, MD       pantoprazole (PROTONIX) injection 40 mg  40 mg Intravenous Once Davonna Belling, MD       sodium chloride flush (NS) 0.9 % injection 3 mL  3 mL Intravenous Once Davonna Belling, MD       Current Outpatient Medications  Medication Sig Dispense Refill   amLODipine (NORVASC) 5 MG tablet Take 1 tablet (5 mg total) by mouth daily. 30 tablet 2   aspirin EC 81 MG tablet Take 1 tablet (81 mg total) by mouth daily. Swallow whole. 30 tablet 12   atorvastatin (LIPITOR) 40 MG tablet Take 1 tablet (40 mg total) by mouth daily. 30 tablet 2   Chlorphen-PE-Acetaminophen (TYLENOL ALLERGY MULTI-SYMPTOM PO) Take 2 tablets by mouth every 6 (six) hours as needed (cold symptoms).     clopidogrel (PLAVIX) 75 MG tablet Take 1 tablet (75 mg total) by mouth daily for 21 days. 21 tablet 0   folic acid (FOLVITE) 1 MG tablet Take 1 tablet (1 mg total) by mouth daily.     hydrochlorothiazide (HYDRODIURIL) 25 MG tablet Take 1 tablet (25 mg total) by mouth daily. MUST KEEP APPT 09/2021 for further refills 30 tablet 0   labetalol (NORMODYNE) 200 MG tablet Take 1 tablet (200 mg total) by mouth 2 (two) times daily. 60 tablet 2   levothyroxine (SYNTHROID) 137 MCG tablet Take 1 tablet (137 mcg total) by mouth daily before breakfast. 30 tablet 5   losartan (COZAAR) 100 MG tablet Take 1 tablet (100 mg total) by mouth daily. Must keep appt 09/2021 for further refills 30 tablet 0   thiamine (VITAMIN B-1) 100 MG tablet Take 1 tablet (100 mg total) by mouth daily.      Allergies as of 12/24/2021 - Review Complete 12/24/2021  Allergen Reaction Noted   Ciprofloxacin Rash 11/05/2019   Flagyl [metronidazole] Rash 11/05/2019   Penicillins Swelling 07/27/2007    History reviewed. No pertinent family history.  Social History   Socioeconomic History   Marital status: Single    Spouse name: Not on file   Number of children: Not on file   Years of education: Not on file   Highest education level: Not on file  Occupational History   Not on file  Tobacco Use   Smoking status: Every Day    Packs/day:  0.25    Types: Cigarettes   Smokeless tobacco: Never   Tobacco comments:    1 cig a day  Substance and Sexual Activity   Alcohol use: Yes    Alcohol/week: 1.0 - 2.0 standard drink of alcohol    Types: 1 - 2 Cans of beer per week    Comment: occ   Drug use: Yes    Types: Marijuana, Cocaine    Comment: marijuana   Sexual activity: Not on file  Other Topics Concern   Not on file  Social History Narrative   Smokes 1 pack/week x 20 years, smokes marijuna, no cocaine in the last year.  Works for [employer].      Financial assistance approved  for 100% discount at [facility] and has [facility] card Rudell Cobb Aug 01 2009 8.58am   Financial assistance approved for 100% discount at [facility] and has [facility] card Gavin Pound February 02, 2010 12.20pm   Social Determinants of Health   Financial Resource Strain: Not on file  Food Insecurity: Food Insecurity Present (12/04/2021)   Hunger Vital Sign    Worried About Running Out of Food in the Last Year: Never true    Ran Out of Food in the Last Year: Sometimes true  Transportation Needs: Unmet Transportation Needs (12/04/2021)   PRAPARE - Administrator, Civil Service (Medical): Yes    Lack of Transportation (Non-Medical): Yes  Physical Activity: Not on file  Stress: Not on file  Social Connections: Not on file  Intimate Partner Violence: Not At Risk (12/04/2021)   Humiliation, Afraid, Rape, and Kick questionnaire    Fear of Current or Ex-Partner: No    Emotionally Abused: No    Physically Abused: No    Sexually Abused: No    Review of Systems: Pertinent positive and negative review of systems were noted in the above HPI section.  All other review of systems was otherwise negative.   Physical Exam: Vital signs in last 24 hours: Temp:  [98.2 F (36.8 C)-98.5 F (36.9 C)] 98.2 F (36.8 C) (09/25 0830) Pulse Rate:  [55-63] 55 (09/25 0830) Resp:  [14-18] 18 (09/25 0830) BP: (116-128)/(68-72) 128/72 (09/25 0830) SpO2:  [100 %] 100 % (09/25  0830) Weight:  [64.4 kg] 64.4 kg (09/25 0714)   General:   Alert,  Well-developed, well-nourished, thin older African-American female pleasant and cooperative in NAD Head:  Normocephalic and atraumatic. Eyes:  Sclera clear, no icterus.   Conjunctiva pale Ears:  Normal auditory acuity. Nose:  No deformity, discharge,  or lesions. Mouth:  No deformity or lesions.   Neck:  Supple; no masses or thyromegaly. Lungs:  Clear throughout to auscultation.   No wheezes, crackles, or rhonchi. Heart:  Regular rate and rhythm; no murmurs, clicks, rubs,  or gallops. Abdomen:  Soft,nontender, BS active,nonpalp mass or hsm.   Rectal: Not done, done per ER MD with black stool noted Msk:  Symmetrical without gross deformities. . Pulses:  Normal pulses noted. Extremities:  Without clubbing or edema. Neurologic:  Alert and  oriented x4;  grossly normal neurologically. Skin:  Intact without significant lesions or rashes.. Psych:  Alert and cooperative. Normal mood and affect.  Intake/Output from previous day: No intake/output data recorded. Intake/Output this shift: No intake/output data recorded.  Lab Results: Recent Labs    12/24/21 0725 12/24/21 0746 12/24/21 0841  WBC 7.6  --  7.9  HGB 5.8* 7.1* 6.4*  HCT 18.8* 21.0* 20.4*  PLT 268  --  272   BMET Recent Labs    12/24/21 0725 12/24/21 0746  NA 138 138  K 3.6 3.6  CL 108 103  CO2 23  --   GLUCOSE 122* 119*  BUN 11 12  CREATININE 1.32* 1.40*  CALCIUM 8.6*  --    LFT Recent Labs    12/24/21 0725  PROT 7.1  ALBUMIN 3.1*  AST 21  ALT 15  ALKPHOS 48  BILITOT 0.6   PT/INR Recent Labs    12/24/21 0725  LABPROT 13.9  INR 1.1     IMPRESSION:  #26 56 year old African-American female status post left thalamic CVA about 2 weeks ago for which she was admitted.  Discharged home on aspirin and Plavix. Patient presents back to  the ER this morning with increase in right-sided weakness over the past 2 days, also complaining of  weakness and some dyspnea.  CT of the head this a.m. shows the previous CVA, no acute changes, MRI pending  Noted to be profoundly anemic with hemoglobin 5.8-hemoglobin down about 6 g from 12/03/2021  Patient has been aware of black stools over the past week. Prior history of GI bleeding or any prior GI evaluation.  Suspect subacute onset of bleeding with initiation of aspirin and Plavix.  Source of bleeding is not clear, normal BUN currently, unclear whether this represents upper versus lower source.  #2 history of crack cocaine abuse-used yesterday #3 history of regular EtOH use #4 hypertension # 5.  Graves' disease  Plan; do not plan any endoscopic evaluation today, need to complete neuro work-up.  Patient can eat today Transfuse to keep hemoglobin above 8 Start twice daily PPI Given very recent CVA and current neurologic symptoms probably needs to stay on Plavix and aspirin.  GI will follow, timing of any endoscopic evaluation will be determined pending results of MRI and neuro input.      Amy Esterwood PA-C 12/24/2021, 10:34 AM   I have taken an interval history, thoroughly reviewed the chart and examined the patient. I agree with the Advanced Practitioner's note, impression and recommendations, and have recorded additional findings, impressions and recommendations below. I performed a substantive portion of this encounter (>50% time spent), including a complete performance of the medical decision making.  My additional thoughts are as follows:  56 year old woman with recent thalamic CVA on aspirin and Plavix here with GI bleed that is probably upper GI in source.  She has had significant hemoglobin drop in the last 3 weeks and from the sounds of it, has probably had melena for at least the last week.  She remains hypertensive, mentating well and saturating normally on room air. Unfortunately, she continues to drink alcohol, use marijuana and abuse cocaine, demonstrating little  insight and poor judgment regarding the nature and severity of her medical conditions.  Her MRI today did not show any new CVA or extension of the recent one.  Plan is for an upper endoscopy tomorrow, and she was agreeable after discussion of procedure and risks.  The benefits and risks of the planned procedure were described in detail with the patient or (when appropriate) their health care proxy.  Risks were outlined as including, but not limited to, bleeding, infection, perforation, adverse medication reaction leading to cardiac or pulmonary decompensation, pancreatitis (if ERCP).  The limitation of incomplete mucosal visualization was also discussed.  No guarantees or warranties were given.  Patient at increased risk for cardiopulmonary complications of procedure due to medical comorbidities.  Serial hemoglobin and hematocrit  Twice daily IV Protonix  Charlie Pitter III Office:[phone]

## 2021-12-24 NOTE — H&P (View-Only) (Signed)
Consultation  Referring Provider: ER MD/ Rubin Payor Primary Care Physician:  Marcine Matar, MD Primary Gastroenterologist:  none/ unassigned  Reason for Consultation:  Melena  anemia  HPI: Ann Jarvis is a 56 y.o. female, who presented to the emergency room earlier this morning with complaints of increase in right-sided numbness over the past 2 days. She had a very recent admission and was discharged on 12/05/2021 after an acute left thalamic CVA.  She was discharged on aspirin and Plavix. Patient does have history of hypertension, cocaine abuse, Graves' disease status post previous RAI ablation.  She also admits to drinking alcohol fairly regularly though not daily This morning she says she has also been feeling somewhat short of breath with exertion and has had some chest pressure over the past couple of days.  She denies any heartburn or indigestion, no dysphagia, no nausea or vomiting, no abdominal pain. Hemodynamically stable on arrival. Initial labs show hemoglobin of 5.8/hematocrit of 18 down from a hemoglobin of 12.7 on 12/03/2021. Patient says she has been noticing runny black stools at home over the past week, no obvious blood. She denies any NSAID use, denies BCs or Goody's but does admit to using cocaine as recently as yesterday.  Prior history of GI bleeding.  No prior GI evaluation.  History of liver disease that she is aware of.  CT of the head this morning shows the left thalamic infarct, otherwise no acute abnormality.  MRI has been ordered. Stool noted to be black and melenic per ER MD  Labs-EtOH negative WBC 7.6/hemoglobin 5.8/hematocrit 18.8/MCV 94/platelets 268 INR 1.1 Drug screen pending BUN 11/creatinine 1.32 LFT's  within normal limits  She is to be transfused 2 units of packed RBCs  Past Medical History:  Diagnosis Date   Cocaine abuse (HCC)    Grave's disease    s/p radioiodine ablation 03/05/10, F/B Dr. Chestine Spore. On Methimazole   Hypertension     Scabies    Tobacco abuse     Past Surgical History:  Procedure Laterality Date   BTL     MOUTH SURGERY      Prior to Admission medications   Medication Sig Start Date End Date Taking? Authorizing Provider  amLODipine (NORVASC) 5 MG tablet Take 1 tablet (5 mg total) by mouth daily. 12/06/21 03/06/22  Pokhrel, Rebekah Chesterfield, MD  aspirin EC 81 MG tablet Take 1 tablet (81 mg total) by mouth daily. Swallow whole. 12/06/21   Pokhrel, Rebekah Chesterfield, MD  atorvastatin (LIPITOR) 40 MG tablet Take 1 tablet (40 mg total) by mouth daily. 12/06/21 03/06/22  Pokhrel, Rebekah Chesterfield, MD  Chlorphen-PE-Acetaminophen (TYLENOL ALLERGY MULTI-SYMPTOM PO) Take 2 tablets by mouth every 6 (six) hours as needed (cold symptoms).    [provider]  clopidogrel (PLAVIX) 75 MG tablet Take 1 tablet (75 mg total) by mouth daily for 21 days. 12/06/21 12/27/21  Pokhrel, Rebekah Chesterfield, MD  folic acid (FOLVITE) 1 MG tablet Take 1 tablet (1 mg total) by mouth daily. 12/06/21   Pokhrel, Rebekah Chesterfield, MD  hydrochlorothiazide (HYDRODIURIL) 25 MG tablet Take 1 tablet (25 mg total) by mouth daily. MUST KEEP APPT 09/2021 for further refills 09/25/21   Marcine Matar, MD  labetalol (NORMODYNE) 200 MG tablet Take 1 tablet (200 mg total) by mouth 2 (two) times daily. 12/05/21 12/05/22  Pokhrel, Rebekah Chesterfield, MD  levothyroxine (SYNTHROID) 137 MCG tablet Take 1 tablet (137 mcg total) by mouth daily before breakfast. 02/27/21   Marcine Matar, MD  losartan (COZAAR) 100 MG tablet Take 1  tablet (100 mg total) by mouth daily. Must keep appt 09/2021 for further refills 09/25/21   Ladell Pier, MD  thiamine (VITAMIN B-1) 100 MG tablet Take 1 tablet (100 mg total) by mouth daily. 12/06/21   Pokhrel, Corrie Mckusick, MD    Current Facility-Administered Medications  Medication Dose Route Frequency Provider Last Rate Last Admin   0.9 %  sodium chloride infusion  10 mL/hr Intravenous Once Davonna Belling, MD       pantoprazole (PROTONIX) injection 40 mg  40 mg Intravenous Once Davonna Belling, MD       sodium chloride flush (NS) 0.9 % injection 3 mL  3 mL Intravenous Once Davonna Belling, MD       Current Outpatient Medications  Medication Sig Dispense Refill   amLODipine (NORVASC) 5 MG tablet Take 1 tablet (5 mg total) by mouth daily. 30 tablet 2   aspirin EC 81 MG tablet Take 1 tablet (81 mg total) by mouth daily. Swallow whole. 30 tablet 12   atorvastatin (LIPITOR) 40 MG tablet Take 1 tablet (40 mg total) by mouth daily. 30 tablet 2   Chlorphen-PE-Acetaminophen (TYLENOL ALLERGY MULTI-SYMPTOM PO) Take 2 tablets by mouth every 6 (six) hours as needed (cold symptoms).     clopidogrel (PLAVIX) 75 MG tablet Take 1 tablet (75 mg total) by mouth daily for 21 days. 21 tablet 0   folic acid (FOLVITE) 1 MG tablet Take 1 tablet (1 mg total) by mouth daily.     hydrochlorothiazide (HYDRODIURIL) 25 MG tablet Take 1 tablet (25 mg total) by mouth daily. MUST KEEP APPT 09/2021 for further refills 30 tablet 0   labetalol (NORMODYNE) 200 MG tablet Take 1 tablet (200 mg total) by mouth 2 (two) times daily. 60 tablet 2   levothyroxine (SYNTHROID) 137 MCG tablet Take 1 tablet (137 mcg total) by mouth daily before breakfast. 30 tablet 5   losartan (COZAAR) 100 MG tablet Take 1 tablet (100 mg total) by mouth daily. Must keep appt 09/2021 for further refills 30 tablet 0   thiamine (VITAMIN B-1) 100 MG tablet Take 1 tablet (100 mg total) by mouth daily.      Allergies as of 12/24/2021 - Review Complete 12/24/2021  Allergen Reaction Noted   Ciprofloxacin Rash 11/05/2019   Flagyl [metronidazole] Rash 11/05/2019   Penicillins Swelling 07/27/2007    History reviewed. No pertinent family history.  Social History   Socioeconomic History   Marital status: Single    Spouse name: Not on file   Number of children: Not on file   Years of education: Not on file   Highest education level: Not on file  Occupational History   Not on file  Tobacco Use   Smoking status: Every Day    Packs/day:  0.25    Types: Cigarettes   Smokeless tobacco: Never   Tobacco comments:    1 cig a day  Substance and Sexual Activity   Alcohol use: Yes    Alcohol/week: 1.0 - 2.0 standard drink of alcohol    Types: 1 - 2 Cans of beer per week    Comment: occ   Drug use: Yes    Types: Marijuana, Cocaine    Comment: marijuana   Sexual activity: Not on file  Other Topics Concern   Not on file  Social History Narrative   Smokes 1 pack/week x 20 years, smokes marijuna, no cocaine in the last year.  Works for [employer].      Financial assistance approved  for 100% discount at [facility] and has [employer] card Deborah Hill Aug 01 2009 8.58am   Financial assistance approved for 100% discount at [facility] and has [employer] card Deborah February 02, 2010 12.20pm   Social Determinants of Health   Financial Resource Strain: Not on file  Food Insecurity: Food Insecurity Present (12/04/2021)   Hunger Vital Sign    Worried About Running Out of Food in the Last Year: Never true    Ran Out of Food in the Last Year: Sometimes true  Transportation Needs: Unmet Transportation Needs (12/04/2021)   PRAPARE - Transportation    Lack of Transportation (Medical): Yes    Lack of Transportation (Non-Medical): Yes  Physical Activity: Not on file  Stress: Not on file  Social Connections: Not on file  Intimate Partner Violence: Not At Risk (12/04/2021)   Humiliation, Afraid, Rape, and Kick questionnaire    Fear of Current or Ex-Partner: No    Emotionally Abused: No    Physically Abused: No    Sexually Abused: No    Review of Systems: Pertinent positive and negative review of systems were noted in the above HPI section.  All other review of systems was otherwise negative.   Physical Exam: Vital signs in last 24 hours: Temp:  [98.2 F (36.8 C)-98.5 F (36.9 C)] 98.2 F (36.8 C) (09/25 0830) Pulse Rate:  [55-63] 55 (09/25 0830) Resp:  [14-18] 18 (09/25 0830) BP: (116-128)/(68-72) 128/72 (09/25 0830) SpO2:  [100 %] 100 % (09/25  0830) Weight:  [64.4 kg] 64.4 kg (09/25 0714)   General:   Alert,  Well-developed, well-nourished, thin older African-American female pleasant and cooperative in NAD Head:  Normocephalic and atraumatic. Eyes:  Sclera clear, no icterus.   Conjunctiva pale Ears:  Normal auditory acuity. Nose:  No deformity, discharge,  or lesions. Mouth:  No deformity or lesions.   Neck:  Supple; no masses or thyromegaly. Lungs:  Clear throughout to auscultation.   No wheezes, crackles, or rhonchi. Heart:  Regular rate and rhythm; no murmurs, clicks, rubs,  or gallops. Abdomen:  Soft,nontender, BS active,nonpalp mass or hsm.   Rectal: Not done, done per ER MD with black stool noted Msk:  Symmetrical without gross deformities. . Pulses:  Normal pulses noted. Extremities:  Without clubbing or edema. Neurologic:  Alert and  oriented x4;  grossly normal neurologically. Skin:  Intact without significant lesions or rashes.. Psych:  Alert and cooperative. Normal mood and affect.  Intake/Output from previous day: No intake/output data recorded. Intake/Output this shift: No intake/output data recorded.  Lab Results: Recent Labs    12/24/21 0725 12/24/21 0746 12/24/21 0841  WBC 7.6  --  7.9  HGB 5.8* 7.1* 6.4*  HCT 18.8* 21.0* 20.4*  PLT 268  --  272   BMET Recent Labs    12/24/21 0725 12/24/21 0746  NA 138 138  K 3.6 3.6  CL 108 103  CO2 23  --   GLUCOSE 122* 119*  BUN 11 12  CREATININE 1.32* 1.40*  CALCIUM 8.6*  --    LFT Recent Labs    12/24/21 0725  PROT 7.1  ALBUMIN 3.1*  AST 21  ALT 15  ALKPHOS 48  BILITOT 0.6   PT/INR Recent Labs    12/24/21 0725  LABPROT 13.9  INR 1.1     IMPRESSION:  #1 55-year-old African-American female status post left thalamic CVA about 2 weeks ago for which she was admitted.  Discharged home on aspirin and Plavix. Patient presents back to   the ER this morning with increase in right-sided weakness over the past 2 days, also complaining of  weakness and some dyspnea.  CT of the head this a.m. shows the previous CVA, no acute changes, MRI pending  Noted to be profoundly anemic with hemoglobin 5.8-hemoglobin down about 6 g from 12/03/2021  Patient has been aware of black stools over the past week. Prior history of GI bleeding or any prior GI evaluation.  Suspect subacute onset of bleeding with initiation of aspirin and Plavix.  Source of bleeding is not clear, normal BUN currently, unclear whether this represents upper versus lower source.  #2 history of crack cocaine abuse-used yesterday #3 history of regular EtOH use #4 hypertension # 5.  Graves' disease  Plan; do not plan any endoscopic evaluation today, need to complete neuro work-up.  Patient can eat today Transfuse to keep hemoglobin above 8 Start twice daily PPI Given very recent CVA and current neurologic symptoms probably needs to stay on Plavix and aspirin.  GI will follow, timing of any endoscopic evaluation will be determined pending results of MRI and neuro input.      Amy Esterwood PA-C 12/24/2021, 10:34 AM   I have taken an interval history, thoroughly reviewed the chart and examined the patient. I agree with the Advanced Practitioner's note, impression and recommendations, and have recorded additional findings, impressions and recommendations below. I performed a substantive portion of this encounter (>50% time spent), including a complete performance of the medical decision making.  My additional thoughts are as follows:  56 year old woman with recent thalamic CVA on aspirin and Plavix here with GI bleed that is probably upper GI in source.  She has had significant hemoglobin drop in the last 3 weeks and from the sounds of it, has probably had melena for at least the last week.  She remains hypertensive, mentating well and saturating normally on room air. Unfortunately, she continues to drink alcohol, use marijuana and abuse cocaine, demonstrating little  insight and poor judgment regarding the nature and severity of her medical conditions.  Her MRI today did not show any new CVA or extension of the recent one.  Plan is for an upper endoscopy tomorrow, and she was agreeable after discussion of procedure and risks.  The benefits and risks of the planned procedure were described in detail with the patient or (when appropriate) their health care proxy.  Risks were outlined as including, but not limited to, bleeding, infection, perforation, adverse medication reaction leading to cardiac or pulmonary decompensation, pancreatitis (if ERCP).  The limitation of incomplete mucosal visualization was also discussed.  No guarantees or warranties were given.  Patient at increased risk for cardiopulmonary complications of procedure due to medical comorbidities.  Serial hemoglobin and hematocrit  Twice daily IV Protonix  Charlie Pitter III Office:[phone]

## 2021-12-24 NOTE — ED Notes (Signed)
PATIENT IS GETTING A UNIT OF BLOOD NOW

## 2021-12-24 NOTE — ED Notes (Signed)
Pt informed of clear liquid and NPO after midnight.

## 2021-12-24 NOTE — ED Triage Notes (Signed)
Reports had a stroke two weeks ago and now feels her right side has been numb again x 2 days. Reports had right side deficits but now it is number.  +pronator drift on right and mild weakness to right arm and leg.  No facial droop or slurred speech.

## 2021-12-24 NOTE — ED Notes (Signed)
Pt ambulatory to restroom and back to room  

## 2021-12-25 ENCOUNTER — Encounter (HOSPITAL_COMMUNITY)
Admission: EM | Disposition: A | Discharge status: Home / Self Care | Payer: Self-pay | Source: Home / Self Care | Attending: Internal Medicine

## 2021-12-25 ENCOUNTER — Observation Stay (HOSPITAL_COMMUNITY): Payer: Medicaid Other | Admitting: Certified Registered Nurse Anesthetist

## 2021-12-25 ENCOUNTER — Encounter (HOSPITAL_COMMUNITY): Payer: Self-pay | Admitting: Internal Medicine

## 2021-12-25 DIAGNOSIS — I639 Cerebral infarction, unspecified: Secondary | ICD-10-CM

## 2021-12-25 DIAGNOSIS — K921 Melena: Secondary | ICD-10-CM

## 2021-12-25 DIAGNOSIS — E039 Hypothyroidism, unspecified: Secondary | ICD-10-CM | POA: Diagnosis present

## 2021-12-25 DIAGNOSIS — Z8639 Personal history of other endocrine, nutritional and metabolic disease: Secondary | ICD-10-CM | POA: Diagnosis not present

## 2021-12-25 DIAGNOSIS — I6990 Unspecified sequelae of unspecified cerebrovascular disease: Secondary | ICD-10-CM | POA: Diagnosis not present

## 2021-12-25 DIAGNOSIS — K922 Gastrointestinal hemorrhage, unspecified: Secondary | ICD-10-CM | POA: Diagnosis present

## 2021-12-25 DIAGNOSIS — E861 Hypovolemia: Secondary | ICD-10-CM | POA: Diagnosis present

## 2021-12-25 DIAGNOSIS — Q438 Other specified congenital malformations of intestine: Secondary | ICD-10-CM | POA: Diagnosis not present

## 2021-12-25 DIAGNOSIS — Z7902 Long term (current) use of antithrombotics/antiplatelets: Secondary | ICD-10-CM | POA: Diagnosis not present

## 2021-12-25 DIAGNOSIS — F1410 Cocaine abuse, uncomplicated: Secondary | ICD-10-CM | POA: Diagnosis present

## 2021-12-25 DIAGNOSIS — D62 Acute posthemorrhagic anemia: Secondary | ICD-10-CM | POA: Diagnosis present

## 2021-12-25 DIAGNOSIS — I10 Essential (primary) hypertension: Secondary | ICD-10-CM

## 2021-12-25 DIAGNOSIS — K5521 Angiodysplasia of colon with hemorrhage: Secondary | ICD-10-CM | POA: Diagnosis present

## 2021-12-25 DIAGNOSIS — Z880 Allergy status to penicillin: Secondary | ICD-10-CM | POA: Diagnosis not present

## 2021-12-25 DIAGNOSIS — F17210 Nicotine dependence, cigarettes, uncomplicated: Secondary | ICD-10-CM

## 2021-12-25 DIAGNOSIS — M79601 Pain in right arm: Secondary | ICD-10-CM | POA: Diagnosis present

## 2021-12-25 DIAGNOSIS — Z79890 Hormone replacement therapy (postmenopausal): Secondary | ICD-10-CM | POA: Diagnosis not present

## 2021-12-25 DIAGNOSIS — Z79899 Other long term (current) drug therapy: Secondary | ICD-10-CM | POA: Diagnosis not present

## 2021-12-25 DIAGNOSIS — E785 Hyperlipidemia, unspecified: Secondary | ICD-10-CM | POA: Diagnosis present

## 2021-12-25 DIAGNOSIS — Z881 Allergy status to other antibiotic agents status: Secondary | ICD-10-CM | POA: Diagnosis not present

## 2021-12-25 DIAGNOSIS — N179 Acute kidney failure, unspecified: Secondary | ICD-10-CM | POA: Diagnosis present

## 2021-12-25 DIAGNOSIS — I69351 Hemiplegia and hemiparesis following cerebral infarction affecting right dominant side: Secondary | ICD-10-CM | POA: Diagnosis not present

## 2021-12-25 DIAGNOSIS — Z7982 Long term (current) use of aspirin: Secondary | ICD-10-CM | POA: Diagnosis not present

## 2021-12-25 HISTORY — PX: ESOPHAGOGASTRODUODENOSCOPY (EGD) WITH PROPOFOL: SHX5813

## 2021-12-25 LAB — TYPE AND SCREEN
ABO/RH(D): O POS
Antibody Screen: NEGATIVE
Unit division: 0
Unit division: 0

## 2021-12-25 LAB — BPAM RBC
Blood Product Expiration Date: 202310282359
Blood Product Expiration Date: 202310282359
ISSUE DATE / TIME: 202309251132
ISSUE DATE / TIME: 202309251612
Unit Type and Rh: 5100
Unit Type and Rh: 5100

## 2021-12-25 LAB — BASIC METABOLIC PANEL
Anion gap: 7 (ref 5–15)
BUN: 10 mg/dL (ref 6–20)
CO2: 22 mmol/L (ref 22–32)
Calcium: 8.4 mg/dL — ABNORMAL LOW (ref 8.9–10.3)
Chloride: 110 mmol/L (ref 98–111)
Creatinine, Ser: 1.04 mg/dL — ABNORMAL HIGH (ref 0.44–1.00)
GFR, Estimated: >60 mL/min (ref >60)
Glucose, Bld: 88 mg/dL (ref 70–99)
Potassium: 3.6 mmol/L (ref 3.5–5.1)
Sodium: 139 mmol/L (ref 135–145)

## 2021-12-25 LAB — CBC WITH DIFFERENTIAL/PLATELET
Abs Immature Granulocytes: 0.01 10*3/uL (ref 0.00–0.07)
Basophils Absolute: 0 10*3/uL (ref 0.0–0.1)
Basophils Relative: 0 %
Eosinophils Absolute: 0.1 10*3/uL (ref 0.0–0.5)
Eosinophils Relative: 2 %
HCT: 31.7 % — ABNORMAL LOW (ref 36.0–46.0)
Hemoglobin: 9.8 g/dL — ABNORMAL LOW (ref 12.0–15.0)
Immature Granulocytes: 0 %
Lymphocytes Relative: 18 %
Lymphs Abs: 0.9 10*3/uL (ref 0.7–4.0)
MCH: 28 pg (ref 26.0–34.0)
MCHC: 30.9 g/dL (ref 30.0–36.0)
MCV: 90.6 fL (ref 80.0–100.0)
Monocytes Absolute: 0.3 10*3/uL (ref 0.1–1.0)
Monocytes Relative: 6 %
Neutro Abs: 3.6 10*3/uL (ref 1.7–7.7)
Neutrophils Relative %: 74 %
Platelets: 289 10*3/uL (ref 150–400)
RBC: 3.5 MIL/uL — ABNORMAL LOW (ref 3.87–5.11)
RDW: 15.1 % (ref 11.5–15.5)
WBC: 5 10*3/uL (ref 4.0–10.5)
nRBC: 0 % (ref 0.0–0.2)

## 2021-12-25 LAB — CBC
HCT: 26.7 % — ABNORMAL LOW (ref 36.0–46.0)
Hemoglobin: 8.5 g/dL — ABNORMAL LOW (ref 12.0–15.0)
MCH: 29.1 pg (ref 26.0–34.0)
MCHC: 31.8 g/dL (ref 30.0–36.0)
MCV: 91.4 fL (ref 80.0–100.0)
Platelets: 231 10*3/uL (ref 150–400)
RBC: 2.92 MIL/uL — ABNORMAL LOW (ref 3.87–5.11)
RDW: 15.1 % (ref 11.5–15.5)
WBC: 5.5 10*3/uL (ref 4.0–10.5)
nRBC: 0 % (ref 0.0–0.2)

## 2021-12-25 LAB — TSH: TSH: 16.524 u[IU]/mL — ABNORMAL HIGH (ref 0.350–4.500)

## 2021-12-25 LAB — T4, FREE: Free T4: 0.56 ng/dL — ABNORMAL LOW (ref 0.61–1.12)

## 2021-12-25 SURGERY — ESOPHAGOGASTRODUODENOSCOPY (EGD) WITH PROPOFOL
Anesthesia: Monitor Anesthesia Care

## 2021-12-25 MED ORDER — LEVOTHYROXINE SODIUM 100 MCG PO TABS
200.0000 ug | ORAL_TABLET | Freq: Every day | ORAL | Status: DC
  Administered 2021-12-26 – 2021-12-28 (×3): 200 ug via ORAL
  Filled 2021-12-25 (×3): qty 2

## 2021-12-25 MED ORDER — PROPOFOL 10 MG/ML IV BOLUS
INTRAVENOUS | Status: DC | PRN
  Administered 2021-12-25: 25 mg via INTRAVENOUS
  Administered 2021-12-25: 20 mg via INTRAVENOUS
  Administered 2021-12-25 (×2): 25 mg via INTRAVENOUS

## 2021-12-25 MED ORDER — PEG-KCL-NACL-NASULF-NA ASC-C 100 G PO SOLR
0.5000 | Freq: Once | ORAL | Status: AC
  Administered 2021-12-25: 100 g via ORAL
  Filled 2021-12-25: qty 1

## 2021-12-25 MED ORDER — HYDRALAZINE HCL 20 MG/ML IJ SOLN: 10.0000 mg | Freq: Four times a day (QID) | INTRAMUSCULAR | Status: DC | PRN

## 2021-12-25 MED ORDER — AMLODIPINE BESYLATE 10 MG PO TABS
10.0000 mg | ORAL_TABLET | Freq: Every day | ORAL | Status: DC
  Administered 2021-12-25 – 2021-12-28 (×4): 10 mg via ORAL
  Filled 2021-12-25: qty 1
  Filled 2021-12-25: qty 2
  Filled 2021-12-25 (×2): qty 1

## 2021-12-25 MED ORDER — PEG-KCL-NACL-NASULF-NA ASC-C 100 G PO SOLR: 1.0000 | Freq: Once | ORAL | Status: DC

## 2021-12-25 MED ORDER — LACTATED RINGERS IV SOLN: INTRAVENOUS | Status: DC | PRN

## 2021-12-25 MED ORDER — HYDRALAZINE HCL 50 MG PO TABS
100.0000 mg | ORAL_TABLET | Freq: Three times a day (TID) | ORAL | Status: DC
  Administered 2021-12-25 – 2021-12-28 (×10): 100 mg via ORAL
  Filled 2021-12-25 (×10): qty 2

## 2021-12-25 MED ORDER — CLONIDINE HCL 0.2 MG PO TABS: 0.2000 mg | ORAL_TABLET | Freq: Three times a day (TID) | ORAL | Status: DC | PRN

## 2021-12-25 MED ORDER — CLONIDINE HCL 0.1 MG PO TABS: 0.1000 mg | ORAL_TABLET | Freq: Three times a day (TID) | ORAL | Status: DC

## 2021-12-25 MED ORDER — PEG-KCL-NACL-NASULF-NA ASC-C 100 G PO SOLR
0.5000 | Freq: Once | ORAL | Status: AC
  Administered 2021-12-25: 100 g via ORAL

## 2021-12-25 MED ORDER — LEVOTHYROXINE SODIUM 75 MCG PO TABS: 150.0000 ug | ORAL_TABLET | Freq: Every day | ORAL | Status: DC

## 2021-12-25 MED ORDER — AMLODIPINE BESYLATE 5 MG PO TABS: 10.0000 mg | ORAL_TABLET | Freq: Every day | ORAL | Status: DC

## 2021-12-25 MED ORDER — PROPOFOL 500 MG/50ML IV EMUL
INTRAVENOUS | Status: DC | PRN
  Administered 2021-12-25: 150 ug/kg/min via INTRAVENOUS

## 2021-12-25 MED ORDER — CLONIDINE HCL 0.2 MG PO TABS
0.2000 mg | ORAL_TABLET | Freq: Three times a day (TID) | ORAL | Status: DC
  Administered 2021-12-25 (×2): 0.2 mg via ORAL
  Filled 2021-12-25 (×2): qty 1

## 2021-12-25 SURGICAL SUPPLY — 15 items

## 2021-12-25 NOTE — Anesthesia Preprocedure Evaluation (Addendum)
Anesthesia Evaluation  Patient identified by MRN, date of birth, ID band Patient awake    Reviewed: Allergy & Precautions, NPO status , Patient's Chart, lab work & pertinent test results, reviewed documented beta blocker date and time   History of Anesthesia Complications Negative for: history of anesthetic complications  Airway Mallampati: I  TM Distance: >3 FB Neck ROM: Full    Dental  (+) Edentulous Lower, Edentulous Upper   Pulmonary Current Smoker and Patient abstained from smoking.,    Pulmonary exam normal        Cardiovascular hypertension, Pt. on medications and Pt. on home beta blockers Normal cardiovascular exam   '23 TTE - EF 55 to 60%. There is severe concentric left ventricular hypertrophy. Grade II diastolic dysfunction  (pseudonormalization). Left atrial size was mildly dilated. Mild mitral valve regurgitation. Aortic valve regurgitation is trivial    Neuro/Psych  Bell's palsy  CVA, Residual Symptoms negative psych ROS   GI/Hepatic GERD  Controlled,(+)     substance abuse (last cocaine ~48 hr ago)  cocaine use and marijuana use,   Endo/Other  Hypothyroidism  Graves' disease s/p ablation   Renal/GU negative Renal ROS     Musculoskeletal negative musculoskeletal ROS (+)   Abdominal   Peds  Hematology  (+) Blood dyscrasia, anemia ,  On plavix    Anesthesia Other Findings Hx scabies Poor historian   Reproductive/Obstetrics                           Anesthesia Physical Anesthesia Plan  ASA: 4  Anesthesia Plan: MAC   Post-op Pain Management:    Induction:   PONV Risk Score and Plan: 1 and Propofol infusion and Treatment may vary due to age or medical condition  Airway Management Planned: Nasal Cannula and Natural Airway  Additional Equipment: None  Intra-op Plan:   Post-operative Plan:   Informed Consent: I have reviewed the patients History and Physical,  chart, labs and discussed the procedure including the risks, benefits and alternatives for the proposed anesthesia with the patient or authorized representative who has indicated his/her understanding and acceptance.       Plan Discussed with: CRNA and Anesthesiologist  Anesthesia Plan Comments:        Anesthesia Quick Evaluation

## 2021-12-25 NOTE — Progress Notes (Signed)
OT Cancellation Note  Patient Details Name: Ann Jarvis MRN: [id number] DOB: 09/10/1965   Cancelled Treatment:    Reason Eval/Treat Not Completed: Patient at procedure or test/ unavailable in EGD this AM. Will follow up for OT eval as schedule permits.  Layla Maw 12/25/2021, 11:42 AM

## 2021-12-25 NOTE — Progress Notes (Signed)
PROGRESS NOTE                                                                                                                                                                                                             Patient Demographics:    Ann Jarvis, is a 56 y.o. female, DOB - September 15, 1965, LKG:[id number]  Outpatient Primary MD for the patient is Ladell Pier, MD    LOS - 0  Admit date - 12/24/2021    Chief Complaint  Patient presents with   Numbness       Brief Narrative (HPI from H&P)   56 y.o. female with medical history significant of hypertension, CVA, Graves' disease s/p RAI ablation 2011, tobacco abuse, and cocaine abuse who presents with complaints of right arm pain for the last 2 to 4 days.  She reports having achy pain from her fingers all the way into her shoulder on the right arm.  Reported similar pain during recent hospitalization from 9/4-9/6 when she was found to found to have a left thalamic infarct.  She is evaluated by neurology and started on aspirin and Plavix, now presents with weakness some numbness in extremities along with history of black stools for the last week.  In the ER she was found to have melanotic stools which were heme positive with severe anemia.   Subjective:    Ann Jarvis today has, No headache, No chest pain, No abdominal pain - No Nausea, No new weakness tingling or numbness, no SOB or dark stools, no blood in stool.   Assessment  & Plan :    Acute blood loss related anemia due to acute upper GI bleed - she received 2 units of packed RBC transfusion, aspirin and Plavix have been held, on IV PPI, further bleeding seems to have abated, GI has been consulted going for EGD today.  We will continue to monitor H&H closely.  Once cleared by GI resume aspirin with caution.  HX of recent stroke with right-sided weakness.  Almost completely resolved. Her this was discussed by  admitting physician with the neurology team, resume aspirin once cleared by GI, continue statin for secondary prevention, her deficits have almost completely resolved, PT OT and monitor.  History of ongoing substance abuse including cocaine abuse.  Counseled to quit.  Essential hypertension.  Blood pressure medications adjusted.  Avoiding beta-blocker due to ongoing cocaine abuse.  Dyslipidemia.  Continue statin.  Hypothyroidism.  Have increased Synthroid dose.  PCP to monitor closely.  AKI due to hypovolemia from blood loss, stabilized after transfusion.  Monitor.      Condition - Extremely Guarded  Family Communication  :  None  Code Status :  Full  Consults  :  GI, Neuro over the phone by admitting physician  PUD Prophylaxis : PPI   Procedures  :     CT head and MRI brain.  Nonacute.      Disposition Plan  :    Status is: Observation  DVT Prophylaxis  :    SCDs Start: 12/24/21 1242    Lab Results  Component Value Date   PLT 231 12/25/2021    Diet :  Diet Order             Diet NPO time specified Except for: Sips with Meds  Diet effective midnight                    Inpatient Medications  Scheduled Meds:  [MAR Hold] amLODipine  10 mg Oral Daily   [MAR Hold] atorvastatin  40 mg Oral Daily   [MAR Hold] hydrALAZINE  100 mg Oral Q8H   [START ON 12/26/2021] levothyroxine  200 mcg Oral QAC breakfast   [MAR Hold] pantoprazole (PROTONIX) IV  40 mg Intravenous Q12H   [MAR Hold] sodium chloride flush  3 mL Intravenous Q12H   Continuous Infusions: PRN Meds:.[MAR Hold] acetaminophen **OR** [MAR Hold] acetaminophen, [MAR Hold] albuterol, [MAR Hold] cloNIDine, [MAR Hold] hydrALAZINE, [MAR Hold] ondansetron  Antibiotics  :    Anti-infectives (From admission, onward)    None        Time Spent in minutes  30   Susa Raring M.D on 12/25/2021 at 9:48 AM  To page go to [email or web address]   [facility] -  Office  [phone]  See all Orders from  today for further details    Objective:   Vitals:   12/25/21 0500 12/25/21 0630 12/25/21 0800 12/25/21 0917  BP: (!) 156/78 (!) 191/81 (!) 154/87 (!) 166/88  Pulse: (!) 59 (!) 54 (!) 58 63  Resp: 17 18 19 17   Temp:    98.4 F (36.9 C)  TempSrc:    Temporal  SpO2: 98% 100% 100% 100%  Weight:      Height:        Wt Readings from Last 3 Encounters:  12/24/21 64.4 kg  08/21/20 64.6 kg  12/28/19 69.9 kg     Intake/Output Summary (Last 24 hours) at 12/25/2021 0948 Last data filed at 12/25/2021 0031 Gross per 24 hour  Intake 2061.09 ml  Output --  Net 2061.09 ml     Physical Exam  Awake Alert, No new F.N deficits, Normal affect [place].AT,PERRAL Supple Neck, No JVD,   Symmetrical Chest wall movement, Good air movement bilaterally, CTAB RRR,No Gallops,Rubs or new Murmurs,  +ve B.Sounds, Abd Soft, No tenderness,   No Cyanosis, Clubbing or edema      Data Review:    CBC Recent Labs  Lab 12/24/21 0725 12/24/21 0746 12/24/21 0841 12/24/21 2054 12/25/21 0341  WBC 7.6  --  7.9  --  5.5  HGB 5.8* 7.1* 6.4* 8.4* 8.5*  HCT 18.8* 21.0* 20.4* 26.7* 26.7*  PLT 268  --  272  --  231  MCV 94.9  --  94.0  --  91.4  MCH 29.3  --  29.5  --  29.1  MCHC 30.9  --  31.4  --  31.8  RDW 14.9  --  14.9  --  15.1  LYMPHSABS 0.9  --  0.7  --   --   MONOABS 0.4  --  0.4  --   --   EOSABS 0.1  --  0.1  --   --   BASOSABS 0.0  --  0.0  --   --     Electrolytes Recent Labs  Lab 12/24/21 0725 12/24/21 0746 12/25/21 0341  NA 138 138 139  K 3.6 3.6 3.6  CL 108 103 110  CO2 23  --  22  GLUCOSE 122* 119* 88  BUN 11 12 10   CREATININE 1.32* 1.40* 1.04*  CALCIUM 8.6*  --  8.4*  AST 21  --   --   ALT 15  --   --   ALKPHOS 48  --   --   BILITOT 0.6  --   --   ALBUMIN 3.1*  --   --   INR 1.1  --   --   TSH  --   --  16.524*    ------------------------------------------------------------------------------------------------------------------ No results for input(s): "CHOL", "HDL",  "LDLCALC", "TRIG", "CHOLHDL", "LDLDIRECT" in the last 72 hours.  Lab Results  Component Value Date   HGBA1C 4.8 12/04/2021    Recent Labs    12/25/21 0341  TSH 16.524*     Radiology Reports MR BRAIN WO CONTRAST  Result Date: 12/24/2021 CLINICAL DATA:  Neuro deficit, acute, stroke suspected EXAM: MRI HEAD WITHOUT CONTRAST TECHNIQUE: Multiplanar, multiecho pulse sequences of the brain and surrounding structures were obtained without intravenous contrast. COMPARISON:  CT head the same day.  MRI head 12/03/2021. FINDINGS: Motion limited study. Brain: No acute infarction, acute hemorrhage, hydrocephalus, extra-axial collection or mass lesion. Scattered T2/FLAIR hyperintensities in the white matter. Punctate focus susceptibility artifact in left thalamus, suggestive of prior infarct. Vascular: Major arterial flow voids maintained at the skull base. Skull and upper cervical spine: Normal marrow signal. Sinuses/Orbits: Clear sinuses.  Neck overall findings. Other: No mastoid effusions. IMPRESSION: 1. No evidence of acute intracranial abnormality on this motion limited study. 2. Scattered T2/FLAIR hyperintensities in the white matter, nonspecific but compatible with chronic microvascular ischemic disease. Chronic demyelination is a differential consideration. Electronically Signed   By: 02/02/2022 M.D.   On: 12/24/2021 16:01   CT HEAD WO CONTRAST  Result Date: 12/24/2021 CLINICAL DATA:  Neuro deficit, acute, stroke suspected EXAM: CT HEAD WITHOUT CONTRAST TECHNIQUE: Contiguous axial images were obtained from the base of the skull through the vertex without intravenous contrast. RADIATION DOSE REDUCTION: This exam was performed according to the departmental dose-optimization program which includes automated exposure control, adjustment of the mA and/or kV according to patient size and/or use of iterative reconstruction technique. COMPARISON:  CT head and MRI head 12/03/2021. FINDINGS: Brain: Left  thalamic infarct better characterized on prior MRI. Otherwise, no evidence of acute large vascular territory infarct, acute hemorrhage, hydrocephalus, mass lesion or abnormal mass effect. Patchy white matter hypodensities, nonspecific but compatible with chronic microvascular ischemic disease. Vascular: No hyperdense vessel identified. Skull: No acute fracture. Sinuses/Orbits: Clear visualized sinuses. No acute orbital findings. Other: Pneumatosis a fusions. IMPRESSION: 1. Left thalamic infarct better characterized on prior MRI. 2. Otherwise, no evidence acute intracranial abnormality. Electronically Signed   By: 02/02/2022 M.D.   On: 12/24/2021 08:24

## 2021-12-25 NOTE — Progress Notes (Signed)
PT Cancellation Note  Patient Details Name: Ann Jarvis MRN: [id number] DOB: [birth year]   Cancelled Treatment:    Reason Eval/Treat Not Completed: Patient at procedure or test/unavailable.  In EGD, will retry as time and pt allow.   Ramond Dial 12/25/2021, 10:29 AM  Mee Hives, PT PhD Acute Rehab Dept. Number: [place]

## 2021-12-25 NOTE — Transfer of Care (Signed)
Immediate Anesthesia Transfer of Care Note  Patient: Ann Jarvis  Procedure(s) Performed: ESOPHAGOGASTRODUODENOSCOPY (EGD) WITH PROPOFOL  Patient Location: Endoscopy Unit  Anesthesia Type:MAC  Level of Consciousness: drowsy  Airway & Oxygen Therapy: Patient Spontanous Breathing and Patient connected to nasal cannula oxygen  Post-op Assessment: Report given to RN and Post -op Vital signs reviewed and stable  Post vital signs: Reviewed and stable  Last Vitals:  Vitals Value Taken Time  BP 123/59 12/25/21 1110  Temp    Pulse 75 12/25/21 1110  Resp 20 12/25/21 1110  SpO2 99 % 12/25/21 1110    Last Pain:  Vitals:   12/25/21 0917  TempSrc: Temporal  PainSc: 0-No pain         Complications: No notable events documented.

## 2021-12-25 NOTE — Anesthesia Procedure Notes (Signed)
Procedure Name: MAC Date/Time: 12/25/2021 10:46 AM  Performed by: Colin Benton, CRNAPre-anesthesia Checklist: Patient identified, Emergency Drugs available, Suction available and Patient being monitored Patient Re-evaluated:Patient Re-evaluated prior to induction Oxygen Delivery Method: Nasal cannula Induction Type: IV induction Airway Equipment and Method: Bite block Placement Confirmation: positive ETCO2 Dental Injury: Teeth and Oropharynx as per pre-operative assessment

## 2021-12-25 NOTE — Interval H&P Note (Signed)
History and Physical Interval Note:  12/25/2021 10:02 AM  Ann Jarvis  has presented today for surgery, with the diagnosis of GI bleed , melena.  The various methods of treatment have been discussed with the patient and family. After consideration of risks, benefits and other options for treatment, the patient has consented to  Procedure(s): ESOPHAGOGASTRODUODENOSCOPY (EGD) WITH PROPOFOL (N/A) as a surgical intervention.  The patient's history has been reviewed, patient examined, no change in status, stable for surgery.  I have reviewed the patient's chart and labs.  Questions were answered to the patient's satisfaction.    Hgb 8.5 last evening and 8.4 this AM She denies chest pain, dyspnea or abdominal pain today.  Alert and conversational in the pre-op area. Breathing comfortably and no abdominal tenderness  [name]

## 2021-12-25 NOTE — Anesthesia Postprocedure Evaluation (Signed)
Anesthesia Post Note  Patient: Ann Jarvis  Procedure(s) Performed: ESOPHAGOGASTRODUODENOSCOPY (EGD) WITH PROPOFOL     Patient location during evaluation: PACU Anesthesia Type: MAC Level of consciousness: awake and alert Pain management: pain level controlled Vital Signs Assessment: post-procedure vital signs reviewed and stable Respiratory status: spontaneous breathing, nonlabored ventilation and respiratory function stable Cardiovascular status: stable and blood pressure returned to baseline Anesthetic complications: no   No notable events documented.  Last Vitals:  Vitals:   12/25/21 1154 12/25/21 1200  BP:  134/87  Pulse:  94  Resp:  16  Temp: 36.8 C   SpO2:  (!) 61%    Last Pain:  Vitals:   12/25/21 1154  TempSrc: Oral  PainSc:                  Audry Pili

## 2021-12-25 NOTE — ED Notes (Addendum)
Pt transported to Endoscopy.All pt belonging's sent with pt

## 2021-12-25 NOTE — Op Note (Signed)
[facility] Patient Name: Ann Jarvis Procedure Date : 12/25/2021 MRN: [id number] Attending MD: Estill Cotta. Loletha Carrow , MD Date of Birth: 03/21/1966 CSN: [id number] Age: 56 Admit Type: Inpatient Procedure:                Upper GI endoscopy Indications:              Acute post hemorrhagic anemia, Melena Providers:                Mallie Mussel L. Loletha Carrow, MD, Dulcy Fanny, Luan Moore, Technician, Brien Mates, RNFA, Pearline Cables, CRNA Referring MD:             [facility] (Dr. Lala Lund) Medicines:                Monitored Anesthesia Care Complications:            No immediate complications. Estimated Blood Loss:     Estimated blood loss: none. Procedure:                Pre-Anesthesia Assessment:                           - Prior to the procedure, a History and Physical                            was performed, and patient medications and                            allergies were reviewed. The patient's tolerance of                            previous anesthesia was also reviewed. The risks                            and benefits of the procedure and the sedation                            options and risks were discussed with the patient.                            All questions were answered, and informed consent                            was obtained. Prior Anticoagulants: The patient                            last took aspirin and Plavix (clopidogrel) 1 day                            prior to the procedure. ASA Grade Assessment: IV -  A patient with severe systemic disease that is a                            constant threat to life. After reviewing the risks                            and benefits, the patient was deemed in                            satisfactory condition to undergo the procedure.                           After obtaining informed consent, the endoscope was                             passed under direct vision. Throughout the                            procedure, the patient's blood pressure, pulse, and                            oxygen saturations were monitored continuously. The                            [id number]) Olympus endoscope was introduced                            through the mouth, and advanced to the second part                            of duodenum. The upper GI endoscopy was                            accomplished without difficulty. The patient                            tolerated the procedure well. Scope In: Scope Out: Findings:      The esophagus was normal.      The stomach was normal.      The cardia and gastric fundus were normal on retroflexion.      The examined duodenum was normal. Several small areas of scope trauma       occurred in the bulb and sweep. Impression:               - Normal esophagus.                           - Normal stomach.                           - Normal examined duodenum.                           - No specimens collected.  No fresh or old blood in the UGI tract. Recommendation:           - Return patient to hospital ward for ongoing care.                           - Clear liquid diet.                           - Perform a colonoscopy tomorrow. Procedure Code(s):        --- Professional ---                           [phone], Esophagogastroduodenoscopy, flexible,                            transoral; diagnostic, including collection of                            specimen(s) by brushing or washing, when performed                            (separate procedure) Diagnosis Code(s):        --- Professional ---                           D62, Acute posthemorrhagic anemia                           K92.1, Melena (includes Hematochezia) CPT copyright 2019 American Medical Association. All rights reserved. The codes documented in this report are preliminary and upon  coder review may  be revised to meet current compliance requirements. Romell Wolden L. Loletha Carrow, MD 12/25/2021 11:13:50 AM This report has been signed electronically. Number of Addenda: 0

## 2021-12-25 NOTE — ED Notes (Signed)
Patient returned from endoscopy.

## 2021-12-26 ENCOUNTER — Inpatient Hospital Stay (INDEPENDENT_AMBULATORY_CARE_PROVIDER_SITE_OTHER): Payer: Medicaid Other | Admitting: Primary Care

## 2021-12-26 ENCOUNTER — Inpatient Hospital Stay (HOSPITAL_COMMUNITY): Payer: Medicaid Other | Admitting: Anesthesiology

## 2021-12-26 ENCOUNTER — Encounter (HOSPITAL_COMMUNITY): Payer: Self-pay | Admitting: Internal Medicine

## 2021-12-26 ENCOUNTER — Encounter (HOSPITAL_COMMUNITY)
Admission: EM | Disposition: A | Discharge status: Home / Self Care | Payer: Self-pay | Source: Home / Self Care | Attending: Internal Medicine

## 2021-12-26 DIAGNOSIS — I639 Cerebral infarction, unspecified: Secondary | ICD-10-CM | POA: Diagnosis not present

## 2021-12-26 DIAGNOSIS — I10 Essential (primary) hypertension: Secondary | ICD-10-CM

## 2021-12-26 DIAGNOSIS — D62 Acute posthemorrhagic anemia: Secondary | ICD-10-CM

## 2021-12-26 DIAGNOSIS — N179 Acute kidney failure, unspecified: Secondary | ICD-10-CM | POA: Diagnosis not present

## 2021-12-26 DIAGNOSIS — K922 Gastrointestinal hemorrhage, unspecified: Secondary | ICD-10-CM | POA: Diagnosis not present

## 2021-12-26 DIAGNOSIS — K921 Melena: Secondary | ICD-10-CM

## 2021-12-26 DIAGNOSIS — F17210 Nicotine dependence, cigarettes, uncomplicated: Secondary | ICD-10-CM

## 2021-12-26 DIAGNOSIS — I6990 Unspecified sequelae of unspecified cerebrovascular disease: Secondary | ICD-10-CM

## 2021-12-26 HISTORY — PX: COLONOSCOPY WITH PROPOFOL: SHX5780

## 2021-12-26 LAB — BASIC METABOLIC PANEL
Anion gap: 9 (ref 5–15)
BUN: 7 mg/dL (ref 6–20)
CO2: 23 mmol/L (ref 22–32)
Calcium: 8.9 mg/dL (ref 8.9–10.3)
Chloride: 110 mmol/L (ref 98–111)
Creatinine, Ser: 0.94 mg/dL (ref 0.44–1.00)
GFR, Estimated: >60 mL/min (ref >60)
Glucose, Bld: 89 mg/dL (ref 70–99)
Potassium: 3.8 mmol/L (ref 3.5–5.1)
Sodium: 142 mmol/L (ref 135–145)

## 2021-12-26 LAB — CBC WITH DIFFERENTIAL/PLATELET
Abs Immature Granulocytes: 0.02 10*3/uL (ref 0.00–0.07)
Basophils Absolute: 0 10*3/uL (ref 0.0–0.1)
Basophils Relative: 1 %
Eosinophils Absolute: 0.2 10*3/uL (ref 0.0–0.5)
Eosinophils Relative: 3 %
HCT: 27.6 % — ABNORMAL LOW (ref 36.0–46.0)
Hemoglobin: 8.7 g/dL — ABNORMAL LOW (ref 12.0–15.0)
Immature Granulocytes: 0 %
Lymphocytes Relative: 24 %
Lymphs Abs: 1.1 10*3/uL (ref 0.7–4.0)
MCH: 28.6 pg (ref 26.0–34.0)
MCHC: 31.5 g/dL (ref 30.0–36.0)
MCV: 90.8 fL (ref 80.0–100.0)
Monocytes Absolute: 0.4 10*3/uL (ref 0.1–1.0)
Monocytes Relative: 9 %
Neutro Abs: 2.9 10*3/uL (ref 1.7–7.7)
Neutrophils Relative %: 63 %
Platelets: 259 10*3/uL (ref 150–400)
RBC: 3.04 MIL/uL — ABNORMAL LOW (ref 3.87–5.11)
RDW: 15.1 % (ref 11.5–15.5)
WBC: 4.6 10*3/uL (ref 4.0–10.5)
nRBC: 0 % (ref 0.0–0.2)

## 2021-12-26 LAB — HEMOGLOBIN AND HEMATOCRIT, BLOOD
HCT: 31.2 % — ABNORMAL LOW (ref 36.0–46.0)
HCT: 28.7 % — ABNORMAL LOW (ref 36.0–46.0)
Hemoglobin: 10.3 g/dL — ABNORMAL LOW (ref 12.0–15.0)
Hemoglobin: 9.3 g/dL — ABNORMAL LOW (ref 12.0–15.0)

## 2021-12-26 LAB — MAGNESIUM: Magnesium: 1.9 mg/dL (ref 1.7–2.4)

## 2021-12-26 SURGERY — COLONOSCOPY WITH PROPOFOL
Anesthesia: Monitor Anesthesia Care

## 2021-12-26 MED ORDER — PROPOFOL 500 MG/50ML IV EMUL
INTRAVENOUS | Status: DC | PRN
  Administered 2021-12-26: 150 ug/kg/min via INTRAVENOUS

## 2021-12-26 MED ORDER — LOSARTAN POTASSIUM 50 MG PO TABS
50.0000 mg | ORAL_TABLET | Freq: Every day | ORAL | Status: DC
  Administered 2021-12-27 – 2021-12-28 (×2): 50 mg via ORAL
  Filled 2021-12-26 (×3): qty 1

## 2021-12-26 MED ORDER — ORAL CARE MOUTH RINSE: 15.0000 mL | OROMUCOSAL | Status: DC | PRN

## 2021-12-26 MED ORDER — MELATONIN 3 MG PO TABS
3.0000 mg | ORAL_TABLET | Freq: Every day | ORAL | Status: DC
  Administered 2021-12-26 – 2021-12-27 (×2): 3 mg via ORAL
  Filled 2021-12-26 (×2): qty 1

## 2021-12-26 MED ORDER — PROPOFOL 10 MG/ML IV BOLUS
INTRAVENOUS | Status: DC | PRN
  Administered 2021-12-26: 20 mg via INTRAVENOUS
  Administered 2021-12-26: 30 mg via INTRAVENOUS

## 2021-12-26 MED ORDER — LIDOCAINE HCL (CARDIAC) PF 100 MG/5ML IV SOSY
PREFILLED_SYRINGE | INTRAVENOUS | Status: DC | PRN
  Administered 2021-12-26: 40 mg via INTRAVENOUS

## 2021-12-26 MED ORDER — LIDOCAINE 5 % EX PTCH
1.0000 | MEDICATED_PATCH | CUTANEOUS | Status: DC
  Administered 2021-12-26 – 2021-12-27 (×2): 1 via TRANSDERMAL
  Filled 2021-12-26 (×2): qty 1

## 2021-12-26 MED ORDER — ISOSORBIDE MONONITRATE ER 30 MG PO TB24
30.0000 mg | ORAL_TABLET | Freq: Every day | ORAL | Status: DC
  Administered 2021-12-26 – 2021-12-28 (×3): 30 mg via ORAL
  Filled 2021-12-26 (×3): qty 1

## 2021-12-26 MED ORDER — LACTATED RINGERS IV SOLN: INTRAVENOUS | Status: DC | PRN

## 2021-12-26 SURGICAL SUPPLY — 22 items

## 2021-12-26 NOTE — Evaluation (Signed)
Physical Therapy Evaluation Patient Details Name: Ann Jarvis MRN: [id number] DOB: Jul 09, 1965 Today's Date: 12/26/2021  History of Present Illness  56 y.o. female presents to [facility] hospital on 12/24/2021 with R arm pain and runny black stools, along with DOE and chest pressure. Upper GI endoscopy on 9/26 negative for acute findings. PMH includes HTN, L thalamic CVA, Graves' disease, cocaine abuse.  Clinical Impression  Pt presents to PT with mild deficits in sensation, balance and gait. Pt demonstrates increased lateral sway when mobilizing which is mitigated with UE support of SPC. PT recommends the pt receive a cane at the time of discharge to improve stability for community level mobility.       Recommendations for follow up therapy are one component of a multi-disciplinary discharge planning process, led by the attending physician.  Recommendations may be updated based on patient status, additional functional criteria and insurance authorization.  Follow Up Recommendations No PT follow up      Assistance Recommended at Discharge PRN  Patient can return home with the following  A little help with bathing/dressing/bathroom;Assist for transportation (getting into/out of tub)    Equipment Recommendations Cane  Recommendations for Other Services       Functional Status Assessment Patient has had a recent decline in their functional status and demonstrates the ability to make significant improvements in function in a reasonable and predictable amount of time.     Precautions / Restrictions Precautions Precautions: Fall Restrictions Weight Bearing Restrictions: No      Mobility  Bed Mobility Overal bed mobility: Independent                  Transfers Overall transfer level: Independent                      Ambulation/Gait Ambulation/Gait assistance: Modified independent (Device/Increase time) Gait Distance (Feet): 300 Feet Assistive device: Straight cane,  None Gait Pattern/deviations: Step-through pattern Gait velocity: functional Gait velocity interpretation: >2.62 ft/sec, indicative of community ambulatory   General Gait Details: pt with mild increase in lateral drift without cane, small improvement with UE support of cane for final 2/3 of walk  Stairs Stairs: Yes Stairs assistance: Supervision Stair Management: No rails, With cane, Step to pattern Number of Stairs: 4    Wheelchair Mobility    Modified Rankin (Stroke Patients Only)       Balance Overall balance assessment: Mild deficits observed, not formally tested                                           Pertinent Vitals/Pain Pain Assessment Pain Assessment: No/denies pain    Home Living Family/patient expects to be discharged to:: Private residence Living Arrangements: Spouse/significant other Available Help at Discharge: Family;Available 24 hours/day Type of Home: House Home Access: Stairs to enter Entrance Stairs-Rails: None Entrance Stairs-Number of Steps: 4   Home Layout: One level Home Equipment: None (pt reports not receiving a cane after last admission)      Prior Function Prior Level of Function : Independent/Modified Independent             Mobility Comments: no AD, no falls ADLs Comments: indep     Hand Dominance   Dominant Hand: Right    Extremity/Trunk Assessment   Upper Extremity Assessment Upper Extremity Assessment: RUE deficits/detail RUE Deficits / Details: MMT and ROM are WFL. FM coordination  is mildly slow for dominant hand RUE Sensation: decreased light touch RUE Coordination: decreased fine motor    Lower Extremity Assessment Lower Extremity Assessment: RLE deficits/detail RLE Sensation: decreased light touch    Cervical / Trunk Assessment Cervical / Trunk Assessment: Normal  Communication   Communication: No difficulties  Cognition Arousal/Alertness: Awake/alert Behavior During Therapy: WFL for  tasks assessed/performed Overall Cognitive Status: Within Functional Limits for tasks assessed                                          General Comments General comments (skin integrity, edema, etc.): VSS on RA    Exercises     Assessment/Plan    PT Assessment Patient needs continued PT services  PT Problem List Decreased balance;Decreased mobility       PT Treatment Interventions Gait training;Balance training;Neuromuscular re-education    PT Goals (Current goals can be found in the Care Plan section)  Acute Rehab PT Goals Patient Stated Goal: to go home with cane PT Goal Formulation: With patient Time For Goal Achievement: 01/09/22 Potential to Achieve Goals: Good Additional Goals Additional Goal #1: Pt will score >45/56 on the BERG to indicate a reduced risk for falls Additional Goal #2: Pt will score >19/24 on the DGI to indicate a reduced risk for falls    Frequency Min 2X/week     Co-evaluation               AM-PAC PT "6 Clicks" Mobility  Outcome Measure Help needed turning from your back to your side while in a flat bed without using bedrails?: None Help needed moving from lying on your back to sitting on the side of a flat bed without using bedrails?: None Help needed moving to and from a bed to a chair (including a wheelchair)?: None Help needed standing up from a chair using your arms (e.g., wheelchair or bedside chair)?: None Help needed to walk in hospital room?: None Help needed climbing 3-5 steps with a railing? : A Little 6 Click Score: 23    End of Session   Activity Tolerance: Patient tolerated treatment well Patient left: in bed;with call bell/phone within reach Nurse Communication: Mobility status PT Visit Diagnosis: Other abnormalities of gait and mobility (R26.89);Other symptoms and signs involving the nervous system (R29.898)    Time: 1610-9604 PT Time Calculation (min) (ACUTE ONLY): 12 min   Charges:   PT  Evaluation $PT Eval Low Complexity: [place], PT, DPT Acute Rehabilitation Office [phone]   Zenaida Niece 12/26/2021, 3:03 PM

## 2021-12-26 NOTE — Op Note (Signed)
[facility] Patient Name: Ann Jarvis Procedure Date : 12/26/2021 MRN: [id number] Attending MD: Estill Cotta. Loletha Carrow , MD Date of Birth: 1966-01-04 CSN: [id number] Age: 56 Admit Type: Inpatient Procedure:                Colonoscopy Indications:              Melena, , Acute post hemorrhagic anemia                           No bleeding source on EGD yesterday                           recent thalamic CVA, then on ASA and plavix. (HTN                            and ongoing cocaine use as well) Providers:                Mallie Mussel L. Loletha Carrow, MD, Grace Isaac, RN, Benetta Spar, Technician Referring MD:             [facility] Medicines:                Monitored Anesthesia Care Complications:            No immediate complications. Estimated Blood Loss:     Estimated blood loss: none. Procedure:                Pre-Anesthesia Assessment:                           - Prior to the procedure, a History and Physical                            was performed, and patient medications and                            allergies were reviewed. The patient's tolerance of                            previous anesthesia was also reviewed. The risks                            and benefits of the procedure and the sedation                            options and risks were discussed with the patient.                            All questions were answered, and informed consent                            was obtained. Prior Anticoagulants: The patient has  taken Plavix (clopidogrel), last dose was 2 days                            prior to procedure. ASA Grade Assessment: IV - A                            patient with severe systemic disease that is a                            constant threat to life. After reviewing the risks                            and benefits, the patient was deemed in                            satisfactory condition  to undergo the procedure.                           After obtaining informed consent, the colonoscope                            was passed under direct vision. Throughout the                            procedure, the patient's blood pressure, pulse, and                            oxygen saturations were monitored continuously. The                            [id number]) Olympus colonoscope was                            introduced through the anus and advanced to the the                            cecum, identified by appendiceal orifice and                            ileocecal valve. The colonoscopy was performed with                            difficulty due to poor bowel prep and a redundant                            colon. Successful completion of the procedure was                            aided by using manual pressure, straightening and                            shortening the scope to obtain bowel loop reduction  and lavage. The patient tolerated the procedure                            well. The quality of the bowel preparation was                            poor. The ileocecal valve, appendiceal orifice, and                            rectum were photographed. Scope In: 11:20:51 AM Scope Out: 11:35:57 AM Scope Withdrawal Time: 0 hours 9 minutes 9 seconds  Total Procedure Duration: 0 hours 15 minutes 6 seconds  Findings:      The perianal and digital rectal examinations were normal.      A large amount of opaque liquid stool with fibrous food debris was found       in the entire colon. Lavage of the area was performed, resulting in       incomplete clearance with continued poor visualization.      The exam was otherwise without abnormality. No fresh or old blood in the       colon. Impression:               - Preparation of the colon was poor.                           - Stool in the entire examined colon. Poor                             visualization despite lavage. No apparent source of                            bleeding in colon, given limitations of prep.                           - The examination was otherwise normal.                           - No specimens collected. Recommendation:           - Return patient to hospital ward for ongoing care.                           - Resume regular diet.                           - To visualize the small bowel, perform video                            capsule endoscopy tomorrow. Procedure Code(s):        --- Professional ---                           [phone], Colonoscopy, flexible; diagnostic, including                            collection of specimen(s) by brushing or washing,  when performed (separate procedure) Diagnosis Code(s):        --- Professional ---                           K92.1, Melena (includes Hematochezia)                           D62, Acute posthemorrhagic anemia CPT copyright 2019 American Medical Association. All rights reserved. The codes documented in this report are preliminary and upon coder review may  be revised to meet current compliance requirements. Ann Jarvis L. Loletha Carrow, MD 12/26/2021 11:43:34 AM This report has been signed electronically. Number of Addenda: 0

## 2021-12-26 NOTE — Progress Notes (Signed)
PROGRESS NOTE                                                                                                                                                                                                             Patient Demographics:    Ann Jarvis, is a 56 y.o. female, DOB - 02-26-1966, FBP:[id number]  Outpatient Primary MD for the patient is Marcine Matar, MD    LOS - 1  Admit date - 12/24/2021    Chief Complaint  Patient presents with   Numbness       Brief Narrative (HPI from H&P)   56 y.o. female with medical history significant of hypertension, CVA, Graves' disease s/p RAI ablation 2011, tobacco abuse, and cocaine abuse who presents with complaints of right arm pain for the last 2 to 4 days.  She reports having achy pain from her fingers all the way into her shoulder on the right arm.  Reported similar pain during recent hospitalization from 9/4-9/6 when she was found to found to have a left thalamic infarct.  She is evaluated by neurology and started on aspirin and Plavix, now presents with weakness some numbness in extremities along with history of black stools for the last week.  In the ER she was found to have melanotic stools which were heme positive with severe anemia.   Subjective:   Patient in bed, appears comfortable, denies any headache, no fever, no chest pain or pressure, no shortness of breath , no abdominal pain. No new focal weakness.   Assessment  & Plan :    Acute blood loss related anemia due to acute upper GI bleed - she received 2 units of packed RBC transfusion, aspirin and Plavix have been held, on IV PPI, further bleeding seems to have abated, GI consulted EGD on 12/25/2021 unrevealing, due for colonoscopy plus minus small bowel capsule on 12/26/2021, continue to monitor CBC.  Will wait for GIs recommendation to resume her antiplatelet medications for recent stroke.  HX of recent  stroke with right-sided weakness.  Almost completely resolved. Her this was discussed by admitting physician with the neurology team, resume aspirin once cleared by GI, continue statin for secondary prevention, her deficits have almost completely resolved, PT OT and monitor.  History of ongoing substance abuse including cocaine abuse.  Counseled to quit.  Essential hypertension.  Blood pressure medications adjusted.  Avoiding beta-blocker due to ongoing cocaine abuse.  Dyslipidemia.  Continue statin.  Hypothyroidism.  Have increased Synthroid dose.  PCP to monitor closely.  AKI due to hypovolemia from blood loss, stabilized after transfusion.  Monitor.      Condition - Extremely Guarded  Family Communication  :  None  Code Status :  Full  Consults  :  GI, Neuro over the phone by admitting physician  PUD Prophylaxis : PPI   Procedures  :     CT head and MRI brain.  Nonacute.  EGD.   Impression:               - Normal esophagus.                           - Normal stomach.                           - Normal examined duodenum.                           - No specimens collected.                           No fresh or old blood in the UGI tract. Recommendation:           - Return patient to hospital ward for ongoing care.                           - Clear liquid diet.                           - Perform a colonoscopy tomorrow.   Colonoscopy.     Disposition Plan  :    Status is: Observation  DVT Prophylaxis  :    SCDs Start: 12/24/21 1242    Lab Results  Component Value Date   PLT 259 12/26/2021    Diet :  Diet Order             Diet NPO time specified Except for: Sips with Meds  Diet effective midnight                    Inpatient Medications  Scheduled Meds:  [MAR Hold] amLODipine  10 mg Oral Daily   [MAR Hold] atorvastatin  40 mg Oral Daily   [MAR Hold] hydrALAZINE  100 mg Oral Q8H   [MAR Hold] isosorbide mononitrate  30 mg Oral Daily   [MAR  Hold] levothyroxine  200 mcg Oral QAC breakfast   [MAR Hold] losartan  50 mg Oral Daily   [MAR Hold] pantoprazole (PROTONIX) IV  40 mg Intravenous Q12H   [MAR Hold] sodium chloride flush  3 mL Intravenous Q12H   Continuous Infusions: PRN Meds:.[MAR Hold] acetaminophen **OR** [MAR Hold] acetaminophen, [MAR Hold] albuterol, [MAR Hold] hydrALAZINE, [MAR Hold] ondansetron, [MAR Hold] mouth rinse  Antibiotics  :    Anti-infectives (From admission, onward)    None        Time Spent in minutes  30   Susa Raring M.D on 12/26/2021 at 10:31 AM  To page go to [email or web address]   [facility] -  Office  [phone]  See all Orders from today for further details  Objective:   Vitals:   12/26/21 0019 12/26/21 0459 12/26/21 0822 12/26/21 0920  BP: 135/73 122/71 139/69 (!) 141/74  Pulse: (!) 56 (!) 49 (!) 57   Resp: 17 16 16 18   Temp: 98.1 F (36.7 C) 98.4 F (36.9 C) 98.5 F (36.9 C) 97.6 F (36.4 C)  TempSrc: Oral Oral Oral Tympanic  SpO2:  95% 97% 99%  Weight:      Height:        Wt Readings from Last 3 Encounters:  12/25/21 62.5 kg  08/21/20 64.6 kg  12/28/19 69.9 kg     Intake/Output Summary (Last 24 hours) at 12/26/2021 1031 Last data filed at 12/26/2021 0600 Gross per 24 hour  Intake 1440 ml  Output --  Net 1440 ml     Physical Exam  Awake Alert, No new F.N deficits, Normal affect [place].AT,PERRAL Supple Neck, No JVD,   Symmetrical Chest wall movement, Good air movement bilaterally, CTAB RRR,No Gallops, Rubs or new Murmurs,  +ve B.Sounds, Abd Soft, No tenderness,   No Cyanosis, Clubbing or edema       Data Review:    CBC Recent Labs  Lab 12/24/21 0725 12/24/21 0746 12/24/21 0841 12/24/21 2054 12/25/21 0341 12/25/21 1410 12/26/21 0322  WBC 7.6  --  7.9  --  5.5 5.0 4.6  HGB 5.8*   < > 6.4* 8.4* 8.5* 9.8* 8.7*  HCT 18.8*   < > 20.4* 26.7* 26.7* 31.7* 27.6*  PLT 268  --  272  --  231 289 259  MCV 94.9  --  94.0  --  91.4 90.6 90.8  MCH  29.3  --  29.5  --  29.1 28.0 28.6  MCHC 30.9  --  31.4  --  31.8 30.9 31.5  RDW 14.9  --  14.9  --  15.1 15.1 15.1  LYMPHSABS 0.9  --  0.7  --   --  0.9 1.1  MONOABS 0.4  --  0.4  --   --  0.3 0.4  EOSABS 0.1  --  0.1  --   --  0.1 0.2  BASOSABS 0.0  --  0.0  --   --  0.0 0.0   < > = values in this interval not displayed.    Electrolytes Recent Labs  Lab 12/24/21 0725 12/24/21 0746 12/25/21 0341 12/26/21 0322  NA 138 138 139 142  K 3.6 3.6 3.6 3.8  CL 108 103 110 110  CO2 23  --  22 23  GLUCOSE 122* 119* 88 89  BUN 11 12 10 7   CREATININE 1.32* 1.40* 1.04* 0.94  CALCIUM 8.6*  --  8.4* 8.9  AST 21  --   --   --   ALT 15  --   --   --   ALKPHOS 48  --   --   --   BILITOT 0.6  --   --   --   ALBUMIN 3.1*  --   --   --   MG  --   --   --  1.9  INR 1.1  --   --   --   TSH  --   --  16.524*  --     ------------------------------------------------------------------------------------------------------------------ No results for input(s): "CHOL", "HDL", "LDLCALC", "TRIG", "CHOLHDL", "LDLDIRECT" in the last 72 hours.  Lab Results  Component Value Date   HGBA1C 4.8 12/04/2021    Recent Labs    12/25/21 0341  TSH 16.524*     Radiology  Reports MR BRAIN WO CONTRAST  Result Date: 12/24/2021 CLINICAL DATA:  Neuro deficit, acute, stroke suspected EXAM: MRI HEAD WITHOUT CONTRAST TECHNIQUE: Multiplanar, multiecho pulse sequences of the brain and surrounding structures were obtained without intravenous contrast. COMPARISON:  CT head the same day.  MRI head 12/03/2021. FINDINGS: Motion limited study. Brain: No acute infarction, acute hemorrhage, hydrocephalus, extra-axial collection or mass lesion. Scattered T2/FLAIR hyperintensities in the white matter. Punctate focus susceptibility artifact in left thalamus, suggestive of prior infarct. Vascular: Major arterial flow voids maintained at the skull base. Skull and upper cervical spine: Normal marrow signal. Sinuses/Orbits: Clear  sinuses.  Neck overall findings. Other: No mastoid effusions. IMPRESSION: 1. No evidence of acute intracranial abnormality on this motion limited study. 2. Scattered T2/FLAIR hyperintensities in the white matter, nonspecific but compatible with chronic microvascular ischemic disease. Chronic demyelination is a differential consideration. Electronically Signed   By: Margaretha Sheffield M.D.   On: 12/24/2021 16:01   CT HEAD WO CONTRAST  Result Date: 12/24/2021 CLINICAL DATA:  Neuro deficit, acute, stroke suspected EXAM: CT HEAD WITHOUT CONTRAST TECHNIQUE: Contiguous axial images were obtained from the base of the skull through the vertex without intravenous contrast. RADIATION DOSE REDUCTION: This exam was performed according to the departmental dose-optimization program which includes automated exposure control, adjustment of the mA and/or kV according to patient size and/or use of iterative reconstruction technique. COMPARISON:  CT head and MRI head 12/03/2021. FINDINGS: Brain: Left thalamic infarct better characterized on prior MRI. Otherwise, no evidence of acute large vascular territory infarct, acute hemorrhage, hydrocephalus, mass lesion or abnormal mass effect. Patchy white matter hypodensities, nonspecific but compatible with chronic microvascular ischemic disease. Vascular: No hyperdense vessel identified. Skull: No acute fracture. Sinuses/Orbits: Clear visualized sinuses. No acute orbital findings. Other: Pneumatosis a fusions. IMPRESSION: 1. Left thalamic infarct better characterized on prior MRI. 2. Otherwise, no evidence acute intracranial abnormality. Electronically Signed   By: Margaretha Sheffield M.D.   On: 12/24/2021 08:24

## 2021-12-26 NOTE — Progress Notes (Signed)
OT Cancellation Note  Patient Details Name: Ann Jarvis MRN: [id number] DOB: 07-Feb-1966   Cancelled Treatment:    Reason Eval/Treat Not Completed: Patient at procedure or test/ unavailable (COLONOSCOPY WITH PROPOFOL. OT evaluation to f/u as appropriate.)  Elliot Cousin 12/26/2021, 10:27 AM

## 2021-12-26 NOTE — Anesthesia Preprocedure Evaluation (Addendum)
Anesthesia Evaluation  Patient identified by MRN, date of birth, ID band Patient awake    Reviewed: Allergy & Precautions, NPO status , Patient's Chart, lab work & pertinent test results  Airway Mallampati: II  TM Distance: >3 FB Neck ROM: Full    Dental  (+) Edentulous Lower, Edentulous Upper   Pulmonary Current Smoker and Patient abstained from smoking.,    Pulmonary exam normal        Cardiovascular hypertension, Pt. on medications Normal cardiovascular exam     Neuro/Psych  Neuromuscular disease CVA, Residual Symptoms negative psych ROS   GI/Hepatic Bowel prep,(+)     substance abuse  ,   Endo/Other  Hypothyroidism   Renal/GU negative Renal ROS     Musculoskeletal negative musculoskeletal ROS (+)   Abdominal   Peds  Hematology  (+) Blood dyscrasia, anemia ,   Anesthesia Other Findings GI bleed  Reproductive/Obstetrics                            Anesthesia Physical Anesthesia Plan  ASA: 3  Anesthesia Plan: MAC   Post-op Pain Management:    Induction: Intravenous  PONV Risk Score and Plan: 1 and Propofol infusion and Treatment may vary due to age or medical condition  Airway Management Planned: Simple Face Mask  Additional Equipment:   Intra-op Plan:   Post-operative Plan:   Informed Consent: I have reviewed the patients History and Physical, chart, labs and discussed the procedure including the risks, benefits and alternatives for the proposed anesthesia with the patient or authorized representative who has indicated his/her understanding and acceptance.     Dental advisory given  Plan Discussed with: CRNA  Anesthesia Plan Comments:         Anesthesia Quick Evaluation

## 2021-12-26 NOTE — Anesthesia Postprocedure Evaluation (Signed)
Anesthesia Post Note  Patient: Ann Jarvis  Procedure(s) Performed: COLONOSCOPY WITH PROPOFOL     Patient location during evaluation: Endoscopy Anesthesia Type: MAC Level of consciousness: awake Pain management: pain level controlled Vital Signs Assessment: post-procedure vital signs reviewed and stable Respiratory status: spontaneous breathing, nonlabored ventilation, respiratory function stable and patient connected to nasal cannula oxygen Cardiovascular status: stable and blood pressure returned to baseline Postop Assessment: no apparent nausea or vomiting Anesthetic complications: no   No notable events documented.  Last Vitals:  Vitals:   12/26/21 1150 12/26/21 1205  BP: 134/80 (!) 143/73  Pulse: (!) 55 (!) 48  Resp: (!) 25 19  Temp: 36.7 C 36.7 C  SpO2: 100% 99%    Last Pain:  Vitals:   12/26/21 1205  TempSrc:   PainSc: 0-No pain                 Aylin Rhoads P Yer Castello

## 2021-12-26 NOTE — Transfer of Care (Signed)
Immediate Anesthesia Transfer of Care Note  Patient: Ann Jarvis  Procedure(s) Performed: COLONOSCOPY WITH PROPOFOL  Patient Location: PACU  Anesthesia Type:MAC  Level of Consciousness: awake and patient cooperative  Airway & Oxygen Therapy: Patient Spontanous Breathing  Post-op Assessment: Report given to RN and Post -op Vital signs reviewed and stable  Post vital signs: Reviewed and stable  Last Vitals:  Vitals Value Taken Time  BP 143/73 12/26/21 1205  Temp 36.7 C 12/26/21 1205  Pulse 48 12/26/21 1205  Resp 18 12/26/21 1207  SpO2 86 % 12/26/21 1205  Vitals shown include unvalidated device data.  Last Pain:  Vitals:   12/26/21 1205  TempSrc:   PainSc: 0-No pain         Complications: No notable events documented.

## 2021-12-26 NOTE — Interval H&P Note (Signed)
History and Physical Interval Note:  12/26/2021 9:16 AM  Ann Jarvis  has presented today for surgery, with the diagnosis of GI bleed.  The various methods of treatment have been discussed with the patient and family. After consideration of risks, benefits and other options for treatment, the patient has consented to  Procedure(s): COLONOSCOPY WITH PROPOFOL (N/A) as a surgical intervention.  The patient's history has been reviewed, patient examined, no change in status, stable for surgery.  I have reviewed the patient's chart and labs.  Questions were answered to the patient's satisfaction.    Hgb 8.5 -> 9.8 -> 8.7 since yesterday  [name]

## 2021-12-26 NOTE — Evaluation (Signed)
Occupational Therapy Evaluation Patient Details Name: Ann Jarvis MRN: [id number] DOB: May 03, 1965 Today's Date: 12/26/2021   History of Present Illness 56 y.o. female presents to [facility] on 12/24/2021 with R arm pain and runny black stools, along with DOE and chest pressure. Upper GI endoscopy on 9/26 negative for acute findings. PMH includes HTN, L thalamic CVA, Graves' disease, cocaine abuse.   Clinical Impression   Pt was evaluated s/p the above admission list, she reports indep PTA and denies any falls. She also reported that she was supposed to receive a SPC and a shower chair after recent CVA, but they were never delivered. She has a tub, and is able to get up/down from the tub but admits it is difficult and not safe. Upon this evaluation pt was mod I/indep for all mobility and ADLs. She does not have current acute OT needs, will sign off an encourage mobility with nsg. OP OT was recommended last admission for CVA follow up, continue to recommend.      Recommendations for follow up therapy are one component of a multi-disciplinary discharge planning process, led by the attending physician.  Recommendations may be updated based on patient status, additional functional criteria and insurance authorization.   Follow Up Recommendations  No OT follow up (Continue OP OT for reccent CVA)    Assistance Recommended at Discharge Intermittent Supervision/Assistance  Patient can return home with the following Assist for transportation    Functional Status Assessment  Patient has had a recent decline in their functional status and demonstrates the ability to make significant improvements in function in a reasonable and predictable amount of time.  Equipment Recommendations  Tub/shower seat       Precautions / Restrictions Precautions Precautions: Fall Restrictions Weight Bearing Restrictions: No      Mobility Bed Mobility Overal bed mobility: Independent                   Transfers Overall transfer level: Independent                        Balance Overall balance assessment: No apparent balance deficits (not formally assessed)                 ADL either performed or assessed with clinical judgement   ADL Overall ADL's : Modified independent                 General ADL Comments: Minimal cues for safety and lines, otherwise mod I for ADLs     Vision Baseline Vision/History: 0 No visual deficits Vision Assessment?: No apparent visual deficits            Pertinent Vitals/Pain Pain Assessment Pain Assessment: Faces Faces Pain Scale: No hurt Pain Intervention(s): Monitored during session     Hand Dominance Right   Extremity/Trunk Assessment Upper Extremity Assessment Upper Extremity Assessment: RUE deficits/detail RUE Deficits / Details: MMT and ROM are WFL. FM coordination is mildly slow for dominant hand RUE Sensation: WNL RUE Coordination: decreased fine motor   Lower Extremity Assessment Lower Extremity Assessment: Defer to PT evaluation   Cervical / Trunk Assessment Cervical / Trunk Assessment: Normal   Communication Communication Communication: No difficulties   Cognition Arousal/Alertness: Awake/alert Behavior During Therapy: WFL for tasks assessed/performed Overall Cognitive Status: Within Functional Limits for tasks assessed         General Comments: mildly impulsive, decreased safety awareness     General Comments  VSS on RA, assisted  pt in calling lunch order. Spouse in room at the end of the session            [place] expects to be discharged to:: Private residence Living Arrangements: Spouse/significant other Available Help at Discharge: Family;Available 24 hours/day Type of Home: House Home Access: Stairs to enter [employer] of Steps: 3 Entrance Stairs-Rails: Right;Left;Can reach both Home Layout: One level     Bathroom Shower/Tub: Arts administrator: Standard     Home Equipment: None (Pt reports she was supposed to receive a shower chiar and a SPC last admission for CVA; pt did not receive.)      Lives With: Significant other    Prior Functioning/Environment Prior Level of Function : Independent/Modified Independent             Mobility Comments: no AD, no falls ADLs Comments: indep        OT Problem List: Decreased safety awareness      OT Treatment/Interventions:      OT Goals(Current goals can be found in the care plan section) Acute Rehab OT Goals Patient Stated Goal: to eat lunch OT Goal Formulation: With patient Time For Goal Achievement: 12/18/21   AM-PAC OT "6 Clicks" Daily Activity     Outcome Measure Help from another person eating meals?: None Help from another person taking care of personal grooming?: None Help from another person toileting, which includes using toliet, bedpan, or urinal?: None Help from another person bathing (including washing, rinsing, drying)?: None Help from another person to put on and taking off regular upper body clothing?: None Help from another person to put on and taking off regular lower body clothing?: None 6 Click Score: 24   End of Session Nurse Communication: Mobility status  Activity Tolerance: Patient tolerated treatment well Patient left: in chair;with call bell/phone within reach;with family/visitor present  OT Visit Diagnosis: Muscle weakness (generalized) (M62.81)                Time: 1610-9604 OT Time Calculation (min): 14 min Charges:  OT General Charges $OT Visit: 1 Visit OT Evaluation $OT Eval Low Complexity: 1 Low   Da Authement D Causey 12/26/2021, 12:55 PM

## 2021-12-26 NOTE — Progress Notes (Signed)
@   this time pt has consumed approx. 1/2 of her second bottle of bowel prep. Pt encouraged to continue drinking it and endorsed understanding that delaying its consumption could delay her procedure in the morning. Will continue to monitor.

## 2021-12-26 NOTE — Social Work (Signed)
CSW received consult for substance use. CSW spoke with patient at bedside. CSW offered patient outpatient substance use treatment services resources. Patient accepted. All questions answered. No further questions reported at this time. 

## 2021-12-27 ENCOUNTER — Encounter (HOSPITAL_COMMUNITY)
Admission: EM | Disposition: A | Discharge status: Home / Self Care | Payer: Self-pay | Source: Home / Self Care | Attending: Internal Medicine

## 2021-12-27 DIAGNOSIS — K922 Gastrointestinal hemorrhage, unspecified: Secondary | ICD-10-CM | POA: Diagnosis not present

## 2021-12-27 HISTORY — PX: GIVENS CAPSULE STUDY: SHX5432

## 2021-12-27 LAB — CBC WITH DIFFERENTIAL/PLATELET
Abs Immature Granulocytes: 0.02 10*3/uL (ref 0.00–0.07)
Basophils Absolute: 0 10*3/uL (ref 0.0–0.1)
Basophils Relative: 0 %
Eosinophils Absolute: 0.2 10*3/uL (ref 0.0–0.5)
Eosinophils Relative: 3 %
HCT: 26.2 % — ABNORMAL LOW (ref 36.0–46.0)
Hemoglobin: 8.5 g/dL — ABNORMAL LOW (ref 12.0–15.0)
Immature Granulocytes: 0 %
Lymphocytes Relative: 21 %
Lymphs Abs: 1.2 10*3/uL (ref 0.7–4.0)
MCH: 28.8 pg (ref 26.0–34.0)
MCHC: 32.4 g/dL (ref 30.0–36.0)
MCV: 88.8 fL (ref 80.0–100.0)
Monocytes Absolute: 0.4 10*3/uL (ref 0.1–1.0)
Monocytes Relative: 7 %
Neutro Abs: 4.1 10*3/uL (ref 1.7–7.7)
Neutrophils Relative %: 69 %
Platelets: 273 10*3/uL (ref 150–400)
RBC: 2.95 MIL/uL — ABNORMAL LOW (ref 3.87–5.11)
RDW: 14.6 % (ref 11.5–15.5)
WBC: 6 10*3/uL (ref 4.0–10.5)
nRBC: 0 % (ref 0.0–0.2)

## 2021-12-27 LAB — BASIC METABOLIC PANEL
Anion gap: 9 (ref 5–15)
BUN: 11 mg/dL (ref 6–20)
CO2: 25 mmol/L (ref 22–32)
Calcium: 9.2 mg/dL (ref 8.9–10.3)
Chloride: 104 mmol/L (ref 98–111)
Creatinine, Ser: 1.08 mg/dL — ABNORMAL HIGH (ref 0.44–1.00)
GFR, Estimated: 60 mL/min — ABNORMAL LOW (ref >60)
Glucose, Bld: 104 mg/dL — ABNORMAL HIGH (ref 70–99)
Potassium: 3.8 mmol/L (ref 3.5–5.1)
Sodium: 138 mmol/L (ref 135–145)

## 2021-12-27 LAB — HEMOGLOBIN AND HEMATOCRIT, BLOOD
HCT: 28 % — ABNORMAL LOW (ref 36.0–46.0)
Hemoglobin: 9.2 g/dL — ABNORMAL LOW (ref 12.0–15.0)

## 2021-12-27 LAB — CBC
HCT: 28.5 % — ABNORMAL LOW (ref 36.0–46.0)
Hemoglobin: 9 g/dL — ABNORMAL LOW (ref 12.0–15.0)
MCH: 28.2 pg (ref 26.0–34.0)
MCHC: 31.6 g/dL (ref 30.0–36.0)
MCV: 89.3 fL (ref 80.0–100.0)
Platelets: 280 10*3/uL (ref 150–400)
RBC: 3.19 MIL/uL — ABNORMAL LOW (ref 3.87–5.11)
RDW: 14.4 % (ref 11.5–15.5)
WBC: 7.1 10*3/uL (ref 4.0–10.5)
nRBC: 0 % (ref 0.0–0.2)

## 2021-12-27 LAB — T3: T3, Total: 56 ng/dL — ABNORMAL LOW (ref 71–180)

## 2021-12-27 LAB — MAGNESIUM: Magnesium: 1.9 mg/dL (ref 1.7–2.4)

## 2021-12-27 SURGERY — IMAGING PROCEDURE, GI TRACT, INTRALUMINAL, VIA CAPSULE
Anesthesia: LOCAL

## 2021-12-27 SURGICAL SUPPLY — 1 items: TOWEL COTTON PACK 4EA (MISCELLANEOUS) ×4 IMPLANT

## 2021-12-27 NOTE — Progress Notes (Signed)
Confirmed that pt has been NPO since midnight.  Confirmed that blue tooth paired with recorder.  Pt swallowed capsule without difficulty.

## 2021-12-27 NOTE — Progress Notes (Signed)
Patient ID: Ann Jarvis, female   DOB: 1965/04/20, 56 y.o.   MRN: [id number]    Progress Note   Subjective   Day # 4 CC; melena/anemia-very recent CVA earlier this month also complaining of increased numbness in the right upper extremity  EGD negative Colonoscopy yesterday-poor prep but no evidence of any bleeding in the colon  Capsule endoscopy in progress today.  She reports no further bleeding, no complaints of abdominal discomfort or nausea  Labs today hemoglobin 9.0 stable   Objective   Vital signs in last 24 hours: Temp:  [98.6 F (37 C)-98.7 F (37.1 C)] 98.6 F (37 C) (09/28 0435) Pulse Rate:  [54-63] 63 (09/28 0435) Resp:  [14-16] 14 (09/28 0435) BP: (141-146)/(68-80) 146/70 (09/28 0435) SpO2:  [97 %-100 %] 100 % (09/28 0435) Last BM Date : 12/26/21 General:    Older African-American female female in NAD Heart:  Regular rate and rhythm; no murmurs Lungs: Respirations even and unlabored, lungs CTA bilaterally Abdomen:  Soft, nontender and nondistended. Normal bowel sounds. Extremities:  Without edema. Neurologic:  Alert and oriented, right-sided weakness Psych:  Cooperative. Normal mood and affect.  Intake/Output from previous day: 09/27 0701 - 09/28 0700 In: 763 [P.O.:360; I.V.:403] Out: -  Intake/Output this shift: Total I/O In: 3 [I.V.:3] Out: -   Lab Results: Recent Labs    12/26/21 0322 12/26/21 1257 12/26/21 1827 12/27/21 0245 12/27/21 1113  WBC 4.6  --   --  6.0 7.1  HGB 8.7*   < > 9.3* 8.5* 9.0*  HCT 27.6*   < > 28.7* 26.2* 28.5*  PLT 259  --   --  273 280   < > = values in this interval not displayed.   BMET Recent Labs    12/25/21 0341 12/26/21 0322 12/27/21 0245  NA 139 142 138  K 3.6 3.8 3.8  CL 110 110 104  CO2 22 23 25   GLUCOSE 88 89 104*  BUN 10 7 11   CREATININE 1.04* 0.94 1.08*  CALCIUM 8.4* 8.9 9.2   LFT No results for input(s): "PROT", "ALBUMIN", "AST", "ALT", "ALKPHOS", "BILITOT", "BILIDIR", "IBILI" in the  last 72 hours. PT/INR No results for input(s): "LABPROT", "INR" in the last 72 hours.  Studies/Results: No results found.     Assessment / Plan:    #5 56 year old African-American female admitted with GI bleeding, melena and anemia setting of aspirin and Plavix for very recent CVA  Further active bleeding over the past 24 hours Colonoscopy  and EGD unrevealing Patient is undergoing capsule endoscopy today  Hemoglobin stable at 9 post transfusions  #2 active cocaine abuse #3 hypertension #4 acute kidney injury improved  Plan; continue to monitor hemoglobin, transfuse as indicated Capsule endoscopy will be interpreted tomorrow-further recommendations pending results .   Principal Problem:   GI bleed Active Problems:   Essential hypertension   Hypothyroidism   Recent cerebrovascular accident (CVA)   Acute blood loss anemia   AKI (acute kidney injury) ([place])   Polysubstance abuse ([place])   Melena     LOS: 2 days   Nils Thor PA-C 12/27/2021, 12:36 PM

## 2021-12-27 NOTE — Progress Notes (Signed)
PROGRESS NOTE                                                                                                                                                                                                             Patient Demographics:    Ann Jarvis, is a 56 y.o. female, DOB - 1966-03-06, [id number]  Outpatient Primary MD for the patient is Ladell Pier, MD    LOS - 2  Admit date - 12/24/2021    Chief Complaint  Patient presents with   Numbness       Brief Narrative (HPI from H&P)   56 y.o. female with medical history significant of hypertension, CVA, Graves' disease s/p RAI ablation 2011, tobacco abuse, and cocaine abuse who presents with complaints of right arm pain for the last 2 to 4 days.  She reports having achy pain from her fingers all the way into her shoulder on the right arm.  Reported similar pain during recent hospitalization from 9/4-9/6 when she was found to found to have a left thalamic infarct.  She is evaluated by neurology and started on aspirin and Plavix, now presents with weakness some numbness in extremities along with history of black stools for the last week.  In the ER she was found to have melanotic stools which were heme positive with severe anemia.   Subjective:   Patient in bed, appears comfortable, denies any headache, no fever, no chest pain or pressure, no shortness of breath , no abdominal pain. No new focal weakness.   Assessment  & Plan :    Acute blood loss related anemia due to acute upper GI bleed - she received 2 units of packed RBC transfusion, aspirin and Plavix have been held, on IV PPI, further bleeding seems to have abated, GI consulted EGD on 12/25/2021 and colonoscopy on 12/26/2021 were unrevealing, continue to monitor CBC, no signs of large active bleeding, she has now been given capsule endoscopy, follow results.  HX of recent stroke with right-sided weakness.   Almost completely resolved. Her this was discussed by admitting physician with the neurology team, resume aspirin once cleared by GI, continue statin for secondary prevention, her deficits have almost completely resolved, PT OT and monitor.  History of ongoing substance abuse including cocaine abuse.  Counseled to quit.  Essential hypertension.  Blood pressure medications  adjusted.  Avoiding beta-blocker due to ongoing cocaine abuse.  Dyslipidemia.  Continue statin.  Hypothyroidism.  Have increased Synthroid dose.  PCP to monitor closely.  AKI due to hypovolemia from blood loss, stabilized after transfusion.  Monitor.      Condition - Extremely Guarded  Family Communication  :  None  Code Status :  Full  Consults  :  GI, Neuro over the phone by admitting physician  PUD Prophylaxis : PPI   Procedures  :     CT head and MRI brain.  Nonacute.  EGD.   Impression:               - Normal esophagus.                           - Normal stomach.                           - Normal examined duodenum.                           - No specimens collected.                           No fresh or old blood in the UGI tract. Recommendation:           - Return patient to hospital ward for ongoing care.                           - Clear liquid diet.                           - Perform a colonoscopy tomorrow.   Colonoscopy.  Impression:               - Preparation of the colon was poor.                           - Stool in the entire examined colon. Poor                            visualization despite lavage. No apparent source of                            bleeding in colon, given limitations of prep.                           - The examination was otherwise normal.                           - No specimens collected. Recommendation:           - Return patient to hospital ward for ongoing care.                           - Resume regular diet.                           - To visualize the  small bowel, perform video  capsule endoscopy tomorrow.       Disposition Plan  :    Status is: Observation  DVT Prophylaxis  :    SCDs Start: 12/24/21 1242    Lab Results  Component Value Date   PLT 273 12/27/2021    Diet :  Diet Order             Diet NPO time specified  Diet effective now                    Inpatient Medications  Scheduled Meds:  amLODipine  10 mg Oral Daily   atorvastatin  40 mg Oral Daily   hydrALAZINE  100 mg Oral Q8H   isosorbide mononitrate  30 mg Oral Daily   levothyroxine  200 mcg Oral QAC breakfast   lidocaine  1-3 patch Transdermal Q24H   losartan  50 mg Oral Daily   melatonin  3 mg Oral QHS   sodium chloride flush  3 mL Intravenous Q12H   Continuous Infusions: PRN Meds:.acetaminophen **OR** acetaminophen, albuterol, hydrALAZINE, ondansetron, mouth rinse  Antibiotics  :    Anti-infectives (From admission, onward)    None        Time Spent in minutes  30   Lala Lund M.D on 12/27/2021 at 8:19 AM  To page go to [email or web address]   [facility] -  Office  [phone]  See all Orders from today for further details    Objective:   Vitals:   12/26/21 1955 12/26/21 2010 12/27/21 0000 12/27/21 0435  BP: (!) 143/80 (!) 141/68  (!) 146/70  Pulse: (!) 54 (!) 57 (!) 59 63  Resp: 16   14  Temp: 98.7 F (37.1 C)   98.6 F (37 C)  TempSrc: Oral   Oral  SpO2: 98% 99% 97% 100%  Weight:      Height:        Wt Readings from Last 3 Encounters:  12/25/21 62.5 kg  08/21/20 64.6 kg  12/28/19 69.9 kg     Intake/Output Summary (Last 24 hours) at 12/27/2021 0819 Last data filed at 12/26/2021 2045 Gross per 24 hour  Intake 763 ml  Output --  Net 763 ml     Physical Exam  Awake Alert, No new F.N deficits, Normal affect [place].AT,PERRAL Supple Neck, No JVD,   Symmetrical Chest wall movement, Good air movement bilaterally, CTAB RRR,No Gallops, Rubs or new Murmurs,  +ve B.Sounds, Abd  Soft, No tenderness,   No Cyanosis, Clubbing or edema      Data Review:    CBC Recent Labs  Lab 12/24/21 0725 12/24/21 0746 12/24/21 0841 12/24/21 2054 12/25/21 0341 12/25/21 1410 12/26/21 0322 12/26/21 1257 12/26/21 1827 12/27/21 0245  WBC 7.6  --  7.9  --  5.5 5.0 4.6  --   --  6.0  HGB 5.8*   < > 6.4*   < > 8.5* 9.8* 8.7* 10.3* 9.3* 8.5*  HCT 18.8*   < > 20.4*   < > 26.7* 31.7* 27.6* 31.2* 28.7* 26.2*  PLT 268  --  272  --  231 289 259  --   --  273  MCV 94.9  --  94.0  --  91.4 90.6 90.8  --   --  88.8  MCH 29.3  --  29.5  --  29.1 28.0 28.6  --   --  28.8  MCHC 30.9  --  31.4  --  31.8 30.9 31.5  --   --  32.4  RDW 14.9  --  14.9  --  15.1 15.1 15.1  --   --  14.6  LYMPHSABS 0.9  --  0.7  --   --  0.9 1.1  --   --  1.2  MONOABS 0.4  --  0.4  --   --  0.3 0.4  --   --  0.4  EOSABS 0.1  --  0.1  --   --  0.1 0.2  --   --  0.2  BASOSABS 0.0  --  0.0  --   --  0.0 0.0  --   --  0.0   < > = values in this interval not displayed.    Electrolytes Recent Labs  Lab 12/24/21 0725 12/24/21 0746 12/25/21 0341 12/26/21 0322 12/27/21 0245  NA 138 138 139 142 138  K 3.6 3.6 3.6 3.8 3.8  CL 108 103 110 110 104  CO2 23  --  22 23 25   GLUCOSE 122* 119* 88 89 104*  BUN 11 12 10 7 11   CREATININE 1.32* 1.40* 1.04* 0.94 1.08*  CALCIUM 8.6*  --  8.4* 8.9 9.2  AST 21  --   --   --   --   ALT 15  --   --   --   --   ALKPHOS 48  --   --   --   --   BILITOT 0.6  --   --   --   --   ALBUMIN 3.1*  --   --   --   --   MG  --   --   --  1.9 1.9  INR 1.1  --   --   --   --   TSH  --   --  16.524*  --   --     ------------------------------------------------------------------------------------------------------------------ No results for input(s): "CHOL", "HDL", "LDLCALC", "TRIG", "CHOLHDL", "LDLDIRECT" in the last 72 hours.  Lab Results  Component Value Date   HGBA1C 4.8 12/04/2021    Recent Labs    12/25/21 0341  TSH 16.524*     Radiology Reports MR BRAIN WO  CONTRAST  Result Date: 12/24/2021 CLINICAL DATA:  Neuro deficit, acute, stroke suspected EXAM: MRI HEAD WITHOUT CONTRAST TECHNIQUE: Multiplanar, multiecho pulse sequences of the brain and surrounding structures were obtained without intravenous contrast. COMPARISON:  CT head the same day.  MRI head 12/03/2021. FINDINGS: Motion limited study. Brain: No acute infarction, acute hemorrhage, hydrocephalus, extra-axial collection or mass lesion. Scattered T2/FLAIR hyperintensities in the white matter. Punctate focus susceptibility artifact in left thalamus, suggestive of prior infarct. Vascular: Major arterial flow voids maintained at the skull base. Skull and upper cervical spine: Normal marrow signal. Sinuses/Orbits: Clear sinuses.  Neck overall findings. Other: No mastoid effusions. IMPRESSION: 1. No evidence of acute intracranial abnormality on this motion limited study. 2. Scattered T2/FLAIR hyperintensities in the white matter, nonspecific but compatible with chronic microvascular ischemic disease. Chronic demyelination is a differential consideration. Electronically Signed   By: Margaretha Sheffield M.D.   On: 12/24/2021 16:01   CT HEAD WO CONTRAST  Result Date: 12/24/2021 CLINICAL DATA:  Neuro deficit, acute, stroke suspected EXAM: CT HEAD WITHOUT CONTRAST TECHNIQUE: Contiguous axial images were obtained from the base of the skull through the vertex without intravenous contrast. RADIATION DOSE REDUCTION: This exam was performed according to the departmental dose-optimization program which includes automated exposure control, adjustment of the mA and/or kV according to patient size and/or use of iterative  reconstruction technique. COMPARISON:  CT head and MRI head 12/03/2021. FINDINGS: Brain: Left thalamic infarct better characterized on prior MRI. Otherwise, no evidence of acute large vascular territory infarct, acute hemorrhage, hydrocephalus, mass lesion or abnormal mass effect. Patchy white matter  hypodensities, nonspecific but compatible with chronic microvascular ischemic disease. Vascular: No hyperdense vessel identified. Skull: No acute fracture. Sinuses/Orbits: Clear visualized sinuses. No acute orbital findings. Other: Pneumatosis a fusions. IMPRESSION: 1. Left thalamic infarct better characterized on prior MRI. 2. Otherwise, no evidence acute intracranial abnormality. Electronically Signed   By: Margaretha Sheffield M.D.   On: 12/24/2021 08:24

## 2021-12-28 ENCOUNTER — Encounter (HOSPITAL_COMMUNITY): Payer: Self-pay | Admitting: Gastroenterology

## 2021-12-28 DIAGNOSIS — K922 Gastrointestinal hemorrhage, unspecified: Secondary | ICD-10-CM | POA: Diagnosis not present

## 2021-12-28 DIAGNOSIS — K5521 Angiodysplasia of colon with hemorrhage: Secondary | ICD-10-CM

## 2021-12-28 DIAGNOSIS — D62 Acute posthemorrhagic anemia: Secondary | ICD-10-CM | POA: Diagnosis not present

## 2021-12-28 DIAGNOSIS — N179 Acute kidney failure, unspecified: Secondary | ICD-10-CM | POA: Diagnosis not present

## 2021-12-28 LAB — CBC WITH DIFFERENTIAL/PLATELET
Abs Immature Granulocytes: 0.03 10*3/uL (ref 0.00–0.07)
Basophils Absolute: 0 10*3/uL (ref 0.0–0.1)
Basophils Relative: 1 %
Eosinophils Absolute: 0.2 10*3/uL (ref 0.0–0.5)
Eosinophils Relative: 3 %
HCT: 27.8 % — ABNORMAL LOW (ref 36.0–46.0)
Hemoglobin: 9 g/dL — ABNORMAL LOW (ref 12.0–15.0)
Immature Granulocytes: 1 %
Lymphocytes Relative: 19 %
Lymphs Abs: 1.1 10*3/uL (ref 0.7–4.0)
MCH: 28.8 pg (ref 26.0–34.0)
MCHC: 32.4 g/dL (ref 30.0–36.0)
MCV: 88.8 fL (ref 80.0–100.0)
Monocytes Absolute: 0.5 10*3/uL (ref 0.1–1.0)
Monocytes Relative: 10 %
Neutro Abs: 3.7 10*3/uL (ref 1.7–7.7)
Neutrophils Relative %: 66 %
Platelets: 265 10*3/uL (ref 150–400)
RBC: 3.13 MIL/uL — ABNORMAL LOW (ref 3.87–5.11)
RDW: 14.5 % (ref 11.5–15.5)
WBC: 5.5 10*3/uL (ref 4.0–10.5)
nRBC: 0 % (ref 0.0–0.2)

## 2021-12-28 LAB — HEMOGLOBIN AND HEMATOCRIT, BLOOD
HCT: 31.8 % — ABNORMAL LOW (ref 36.0–46.0)
Hemoglobin: 10 g/dL — ABNORMAL LOW (ref 12.0–15.0)

## 2021-12-28 LAB — BASIC METABOLIC PANEL
Anion gap: 8 (ref 5–15)
BUN: 12 mg/dL (ref 6–20)
CO2: 25 mmol/L (ref 22–32)
Calcium: 8.6 mg/dL — ABNORMAL LOW (ref 8.9–10.3)
Chloride: 104 mmol/L (ref 98–111)
Creatinine, Ser: 1.19 mg/dL — ABNORMAL HIGH (ref 0.44–1.00)
GFR, Estimated: 53 mL/min — ABNORMAL LOW (ref >60)
Glucose, Bld: 102 mg/dL — ABNORMAL HIGH (ref 70–99)
Potassium: 3.5 mmol/L (ref 3.5–5.1)
Sodium: 137 mmol/L (ref 135–145)

## 2021-12-28 LAB — MAGNESIUM: Magnesium: 1.8 mg/dL (ref 1.7–2.4)

## 2021-12-28 MED ORDER — CARVEDILOL 6.25 MG PO TABS
6.2500 mg | ORAL_TABLET | Freq: Two times a day (BID) | ORAL | 0 refills | Status: DC
  Filled 2022-01-01: qty 60, 30d supply, fill #0

## 2021-12-28 MED ORDER — AMLODIPINE BESYLATE 10 MG PO TABS
10.0000 mg | ORAL_TABLET | Freq: Every day | ORAL | 0 refills | Status: DC
  Filled 2022-01-01: qty 30, 30d supply, fill #0

## 2021-12-28 MED ORDER — ISOSORBIDE MONONITRATE ER 30 MG PO TB24
30.0000 mg | ORAL_TABLET | Freq: Every day | ORAL | 0 refills | Status: DC
  Filled 2022-01-01: qty 30, 30d supply, fill #0

## 2021-12-28 MED ORDER — LEVOTHYROXINE SODIUM 200 MCG PO TABS
200.0000 ug | ORAL_TABLET | Freq: Every day | ORAL | 0 refills | Status: DC
  Filled 2022-01-01: qty 30, 30d supply, fill #0

## 2021-12-28 MED ORDER — HYDRALAZINE HCL 100 MG PO TABS
100.0000 mg | ORAL_TABLET | Freq: Three times a day (TID) | ORAL | 0 refills | Status: DC
  Filled 2022-01-01: qty 90, 30d supply, fill #0

## 2021-12-28 MED ORDER — CLOPIDOGREL BISULFATE 75 MG PO TABS: 75.0000 mg | ORAL_TABLET | Freq: Every day | ORAL | 0 refills | Status: DC

## 2021-12-28 MED ORDER — PANTOPRAZOLE SODIUM 40 MG PO TBEC
40.0000 mg | DELAYED_RELEASE_TABLET | Freq: Two times a day (BID) | ORAL | 2 refills | Status: DC
  Filled 2022-01-01: qty 60, 30d supply, fill #0
  Filled 2022-02-11 – 2022-02-18 (×2): qty 60, 30d supply, fill #1
  Filled 2022-04-25 – 2022-06-12 (×3): qty 60, 30d supply, fill #2

## 2021-12-28 MED ORDER — LOSARTAN POTASSIUM 50 MG PO TABS
50.0000 mg | ORAL_TABLET | Freq: Every day | ORAL | 0 refills | Status: DC
  Filled 2022-01-01: qty 30, 30d supply, fill #0

## 2021-12-28 MED ORDER — POTASSIUM CHLORIDE CRYS ER 20 MEQ PO TBCR
40.0000 meq | EXTENDED_RELEASE_TABLET | Freq: Once | ORAL | Status: AC
  Administered 2021-12-28: 40 meq via ORAL
  Filled 2021-12-28: qty 2

## 2021-12-28 NOTE — Discharge Instructions (Signed)
Follow with Primary MD Ladell Pier, MD in 7 days   Get CBC, CMP, TSH, Anemia panel -  checked next visit within 1 week by Primary MD    Activity: As tolerated with Full fall precautions use walker/cane & assistance as needed  Disposition Home   Diet: Heart Healthy    Special Instructions: If you have smoked or chewed Tobacco  in the last 2 yrs please stop smoking, stop any regular Alcohol  and or any Recreational drug use.  On your next visit with your primary care physician please Get Medicines reviewed and adjusted.  Please request your Prim.MD to go over all Hospital Tests and Procedure/Radiological results at the follow up, please get all Hospital records sent to your Prim MD by signing hospital release before you go home.  If you experience worsening of your admission symptoms, develop shortness of breath, life threatening emergency, suicidal or homicidal thoughts you must seek medical attention immediately by calling 911 or calling your MD immediately  if symptoms less severe.  You Must read complete instructions/literature along with all the possible adverse reactions/side effects for all the Medicines you take and that have been prescribed to you. Take any new Medicines after you have completely understood and accpet all the possible adverse reactions/side effects.

## 2021-12-28 NOTE — Progress Notes (Signed)
Mobility Specialist - Progress Note   12/28/21 1001  Mobility  Activity Ambulated with assistance in hallway  Level of Assistance Standby assist, set-up cues, supervision of patient - no hands on  Assistive Device Cane  Distance Ambulated (ft) 350 ft  Activity Response Tolerated well  $Mobility charge 1 Mobility   Pt was received in bed and agreeable to mobility. No complaints throughout ambulation. Pt was returned to EOB with all need met.  Larey Seat

## 2021-12-28 NOTE — Discharge Summary (Signed)
Ann Jarvis LOV:[id number] DOB: 04-Jan-1966 DOA: 12/24/2021  PCP: Ladell Pier, MD  Admit date: 12/24/2021  Discharge date: 12/28/2021  Admitted From: Home   Disposition:  Home   Recommendations for Outpatient Follow-up:   Follow up with PCP in 1-2 weeks  PCP Please obtain BMP/CBC, 2 view CXR in 1week,  (see Discharge instructions)   PCP Please follow up on the following pending results: TSH, BP, BMP, needs close Neuro and GI follow up   Home Health: None   Equipment/Devices: None  Consultations: GI Discharge Condition: Stable    CODE STATUS: Full   Diet Recommendation: Heart Healthy   Chief Complaint  Patient presents with   Numbness     Brief history of present illness from the day of admission and additional interim summary    )   56 y.o. female with medical history significant of hypertension, CVA, Graves' disease s/p RAI ablation 2011, tobacco abuse, and cocaine abuse who presents with complaints of right arm pain for the last 2 to 4 days.  She reports having achy pain from her fingers all the way into her shoulder on the right arm.  Reported similar pain during recent hospitalization from 9/4-9/6 when she was found to found to have a left thalamic infarct.  She is evaluated by neurology and started on aspirin and Plavix, now presents with weakness some numbness in extremities along with history of black stools for the last week.  In the ER she was found to have melanotic stools which were heme positive with severe anemia                                                                 Hospital Course    Acute blood loss related anemia due to acute upper GI bleed - she received 2 units of packed RBC transfusion, aspirin and Plavix have been held, on IV PPI, further bleeding seems to have abated, GI  consulted EGD on 12/25/2021 and colonoscopy on 12/26/2021 were unrevealing, stableCBC, no signs of large active bleeding, small non bleeding AVM on capsule endoscopy, stable for DC per GI, will DC on PPI and ASA only - DW Dr Leonie Man Stroke MD & GI.   HX of recent stroke with right-sided weakness.  Almost completely resolved. Her this was discussed by admitting physician with the neurology team, resume aspirin , continue statin for secondary prevention, her deficits have almost completely resolved, PT OT and monitor.   History of ongoing substance abuse including cocaine abuse.  Counseled to quit.   Essential hypertension.  Blood pressure medications adjusted, stable, says will abstain from Cocaine.   Dyslipidemia.  Continue statin.   Hypothyroidism.  Have increased Synthroid dose.  PCP to monitor closely.   AKI due to hypovolemia from blood loss, stabilized after transfusion.  Monitor.   Discharge diagnosis     Principal Problem:   GI bleed Active Problems:   Acute blood loss anemia   Recent cerebrovascular accident (CVA)   AKI (acute kidney injury) ([place])   Polysubstance abuse ([place])   Essential hypertension   Hypothyroidism   Melena   AVM (arteriovenous malformation) of small bowel, acquired with hemorrhage    Discharge instructions    Discharge Instructions     Diet - low sodium heart healthy   Complete by: As directed    Discharge instructions   Complete by: As directed    Follow with Primary MD Ladell Pier, MD in 7 days   Get CBC, CMP, TSH, Anemia panel -  checked next visit within 1 week by Primary MD    Activity: As tolerated with Full fall precautions use walker/cane & assistance as needed  Disposition Home   Diet: Heart Healthy    Special Instructions: If you have smoked or chewed Tobacco  in the last 2 yrs please stop smoking, stop any regular Alcohol  and or any Recreational drug use.  On your next visit with your primary care physician please Get  Medicines reviewed and adjusted.  Please request your Prim.MD to go over all Hospital Tests and Procedure/Radiological results at the follow up, please get all Hospital records sent to your Prim MD by signing hospital release before you go home.  If you experience worsening of your admission symptoms, develop shortness of breath, life threatening emergency, suicidal or homicidal thoughts you must seek medical attention immediately by calling 911 or calling your MD immediately  if symptoms less severe.  You Must read complete instructions/literature along with all the possible adverse reactions/side effects for all the Medicines you take and that have been prescribed to you. Take any new Medicines after you have completely understood and accpet all the possible adverse reactions/side effects.   Increase activity slowly   Complete by: As directed        Discharge Medications   Allergies as of 12/28/2021       Reactions   Ciprofloxacin Rash, Other (See Comments)   Significant rash, taking with Flagyl   Flagyl [metronidazole] Rash, Other (See Comments)   Developed significant rash while taking with Cipro   Penicillins Swelling   Did it involve swelling of the face/tongue/throat, SOB, or low BP? No Did it involve sudden or severe rash/hives, skin peeling, or any reaction on the inside of your mouth or nose? No Did you need to seek medical attention at a hospital or doctor's office? No When did it last happen?  Pt states a long time ago     If all above answers are "NO", may proceed with cephalosporin use.        Medication List     STOP taking these medications    clopidogrel 75 MG tablet Commonly known as: PLAVIX   folic acid 1 MG tablet Commonly known as: FOLVITE   hydrochlorothiazide 25 MG tablet Commonly known as: HYDRODIURIL   labetalol 200 MG tablet Commonly known as: NORMODYNE       TAKE these medications    amLODipine 5 MG tablet Commonly known as:  NORVASC Take 1 tablet (5 mg total) by mouth daily. What changed: Another medication with the same name was added. Make sure you understand how and when to take each.   amLODipine 10 MG tablet Commonly known as: NORVASC Take 1 tablet (10 mg total) by mouth daily. Start taking on: December 29, 2021 What changed: You were already taking a medication with the same name, and this prescription was added. Make sure you understand how and when to take each.   atorvastatin 40 MG tablet Commonly known as: LIPITOR Take 1 tablet (40 mg total) by mouth daily.   carvedilol 6.25 MG tablet Commonly known as: Coreg Take 1 tablet (6.25 mg total) by mouth 2 (two) times daily.   hydrALAZINE 100 MG tablet Commonly known as: APRESOLINE Take 1 tablet (100 mg total) by mouth every 8 (eight) hours.   isosorbide mononitrate 30 MG 24 hr tablet Commonly known as: IMDUR Take 1 tablet (30 mg total) by mouth daily. Start taking on: December 29, 2021   levothyroxine 200 MCG tablet Commonly known as: SYNTHROID Take 1 tablet (200 mcg total) by mouth daily before breakfast. Start taking on: December 29, 2021 What changed:  medication strength how much to take   losartan 50 MG tablet Commonly known as: COZAAR Take 1 tablet (50 mg total) by mouth daily. Start taking on: December 29, 2021 What changed:  medication strength how much to take additional instructions   pantoprazole 40 MG tablet Commonly known as: Protonix Take 1 tablet (40 mg total) by mouth 2 (two) times daily before a meal.   SM Aspirin Adult Low Strength 81 MG tablet Generic drug: aspirin EC Take 1 tablet (81 mg total) by mouth daily. Swallow whole.   thiamine 100 MG tablet Commonly known as: Vitamin B-1 Take 1 tablet (100 mg total) by mouth daily.   TYLENOL 500 MG tablet Generic drug: acetaminophen Take 1,000 mg by mouth 3 (three) times daily.         Follow-up Information     Ladell Pier, MD. Schedule an  appointment as soon as possible for a visit in 1 week(s).   Specialty: Internal Medicine Contact information: [address] [place] [address] [phone]         Doran Stabler, MD. Schedule an appointment as soon as possible for a visit in 1 month(s).   Specialty: Gastroenterology Contact information: [address] [place] [address] [phone]                 Major procedures and Radiology Reports - PLEASE review detailed and final reports thoroughly  -       MR BRAIN WO CONTRAST  Result Date: 12/24/2021 CLINICAL DATA:  Neuro deficit, acute, stroke suspected EXAM: MRI HEAD WITHOUT CONTRAST TECHNIQUE: Multiplanar, multiecho pulse sequences of the brain and surrounding structures were obtained without intravenous contrast. COMPARISON:  CT head the same day.  MRI head 12/03/2021. FINDINGS: Motion limited study. Brain: No acute infarction, acute hemorrhage, hydrocephalus, extra-axial collection or mass lesion. Scattered T2/FLAIR hyperintensities in the white matter. Punctate focus susceptibility artifact in left thalamus, suggestive of prior infarct. Vascular: Major arterial flow voids maintained at the skull base. Skull and upper cervical spine: Normal marrow signal. Sinuses/Orbits: Clear sinuses.  Neck overall findings. Other: No mastoid effusions. IMPRESSION: 1. No evidence of acute intracranial abnormality on this motion limited study. 2. Scattered T2/FLAIR hyperintensities in the white matter, nonspecific but compatible with chronic microvascular ischemic disease. Chronic demyelination is a differential consideration. Electronically Signed   By: Margaretha Sheffield M.D.   On: 12/24/2021 16:01   CT HEAD WO CONTRAST  Result Date: 12/24/2021 CLINICAL DATA:  Neuro deficit, acute, stroke suspected EXAM: CT HEAD WITHOUT CONTRAST TECHNIQUE: Contiguous axial images were obtained from the base  of the skull through the vertex without intravenous  contrast. RADIATION DOSE REDUCTION: This exam was performed according to the departmental dose-optimization program which includes automated exposure control, adjustment of the mA and/or kV according to patient size and/or use of iterative reconstruction technique. COMPARISON:  CT head and MRI head 12/03/2021. FINDINGS: Brain: Left thalamic infarct better characterized on prior MRI. Otherwise, no evidence of acute large vascular territory infarct, acute hemorrhage, hydrocephalus, mass lesion or abnormal mass effect. Patchy white matter hypodensities, nonspecific but compatible with chronic microvascular ischemic disease. Vascular: No hyperdense vessel identified. Skull: No acute fracture. Sinuses/Orbits: Clear visualized sinuses. No acute orbital findings. Other: Pneumatosis a fusions. IMPRESSION: 1. Left thalamic infarct better characterized on prior MRI. 2. Otherwise, no evidence acute intracranial abnormality. Electronically Signed   By: Margaretha Sheffield M.D.   On: 12/24/2021 08:24   ECHOCARDIOGRAM COMPLETE  Result Date: 12/04/2021    ECHOCARDIOGRAM REPORT   Patient Name:   Ann Jarvis Date of Exam: 12/04/2021 Medical Rec #:  [id number]        Height:       71.0 in Accession #:    [id number]       Weight:       142.4 lb Date of Birth:  [birth year]       BSA:          1.825 m Patient Age:    40 years         BP:           180/92 mmHg Patient Gender: F                HR:           55 bpm. Exam Location:  Inpatient Procedure: 2D Echo, Cardiac Doppler and Color Doppler Indications:    Stroke  History:        Patient has prior history of Echocardiogram examinations, most                 recent 03/30/2019. Risk Factors:Hypertension and Current Smoker.                 Polysubstance abuse, Graves disease.  Sonographer:    Eartha Inch Referring Phys: Lorenza Chick  Sonographer Comments: Image acquisition challenging due to respiratory motion and Image acquisition challenging due to patient body habitus.  IMPRESSIONS  1. Left ventricular ejection fraction, by estimation, is 55 to 60%. The left ventricle has normal function. The left ventricle has no regional wall motion abnormalities. There is severe concentric left ventricular hypertrophy. Left ventricular diastolic  parameters are consistent with Grade II diastolic dysfunction (pseudonormalization). Elevated left atrial pressure.  2. Right ventricular systolic function is normal. The right ventricular size is normal. There is normal pulmonary artery systolic pressure.  3. Left atrial size was mildly dilated.  4. The mitral valve is normal in structure. Mild mitral valve regurgitation.  5. The aortic valve is tricuspid. Aortic valve regurgitation is trivial. No aortic stenosis is present. Comparison(s): A prior study was performed on 03/30/2019 [facility]. Prior images unable to be directly viewed, comparison made by report only. Conclusion(s)/Recommendation(s): Findings consistent with hypertrophic cardiomyopathy. No intracardiac source of embolism detected on this transthoracic study. Consider a transesophageal echocardiogram to exclude cardiac source of embolism if clinically indicated. FINDINGS  Left Ventricle: Left ventricular ejection fraction, by estimation, is 55 to 60%. The left ventricle has normal function. The left ventricle has no regional wall motion abnormalities. The left ventricular internal cavity size  was normal in size. There is  severe concentric left ventricular hypertrophy. Left ventricular diastolic parameters are consistent with Grade II diastolic dysfunction (pseudonormalization). Elevated left atrial pressure. Right Ventricle: The right ventricular size is normal. No increase in right ventricular wall thickness. Right ventricular systolic function is normal. There is normal pulmonary artery systolic pressure. The tricuspid regurgitant velocity is 1.53 m/s, and  with an assumed right atrial pressure of 3 mmHg, the estimated right  ventricular systolic pressure is 0000000 mmHg. Left [facility]: Left atrial size was mildly dilated. Right [facility]: Right atrial size was normal in size. Pericardium: There is no evidence of pericardial effusion. Mitral Valve: The mitral valve is normal in structure. Mild mitral valve regurgitation. Tricuspid Valve: The tricuspid valve is grossly normal. Tricuspid valve regurgitation is mild. Aortic Valve: The aortic valve is tricuspid. Aortic valve regurgitation is trivial. No aortic stenosis is present. Pulmonic Valve: The pulmonic valve was normal in structure. Pulmonic valve regurgitation is not visualized. Aorta: The aortic root is normal in size and structure. IAS/Shunts: The atrial septum is grossly normal.  LEFT VENTRICLE PLAX 2D LVIDd:         4.20 cm   Diastology LVIDs:         2.70 cm   LV e' medial:    4.35 cm/s LV PW:         1.70 cm   LV E/e' medial:  17.4 LV IVS:        2.00 cm   LV e' lateral:   3.21 cm/s LVOT diam:     2.10 cm   LV E/e' lateral: 23.5 LV SV:         66 LV SV Index:   36 LVOT Area:     3.46 cm  RIGHT VENTRICLE             IVC RV S prime:     12.40 cm/s  IVC diam: 1.10 cm TAPSE (M-mode): 2.1 cm LEFT ATRIUM             Index        RIGHT ATRIUM           Index LA diam:        3.50 cm 1.92 cm/m   RA Area:     15.10 cm LA Vol (A2C):   22.9 ml 12.55 ml/m  RA Volume:   37.00 ml  20.27 ml/m LA Vol (A4C):   26.2 ml 14.36 ml/m LA Biplane Vol: 25.4 ml 13.92 ml/m  AORTIC VALVE LVOT Vmax:   88.20 cm/s LVOT Vmean:  62.000 cm/s LVOT VTI:    0.191 m  AORTA Ao Root diam: 2.50 cm MITRAL VALVE               TRICUSPID VALVE MV Area (PHT): 2.03 cm    TR Peak grad:   9.4 mmHg MV Decel Time: 374 msec    TR Vmax:        153.00 cm/s MR Peak grad: 22.7 mmHg MR Vmax:      238.00 cm/s  SHUNTS MV E velocity: 75.50 cm/s  Systemic VTI:  0.19 m MV A velocity: 50.10 cm/s  Systemic Diam: 2.10 cm MV E/A ratio:  1.51 Mihai Croitoru MD Electronically signed by Sanda Klein MD Signature Date/Time: 12/04/2021/1:24:34 PM     Final    MR BRAIN WO CONTRAST  Result Date: 12/03/2021 CLINICAL DATA:  Neuro deficit, acute, stroke suspected. Right-sided numbness. EXAM: MRI HEAD WITHOUT CONTRAST TECHNIQUE: Multiplanar, multiecho pulse sequences  of the brain and surrounding structures were obtained without intravenous contrast. COMPARISON:  Head CT and CTA 12/03/2021 FINDINGS: Brain: There is a 1 cm acute infarct in the left thalamus. Chronic microhemorrhages are noted in the more dorsal aspect of the left thalamus and in the right lentiform nucleus. Small T2 hyperintensities scattered throughout the cerebral white matter bilaterally are nonspecific but compatible with mildly to moderately age advanced chronic small vessel ischemic disease. There is a chronic lacunar infarct in the left caudate head. The ventricles and sulci are normal. No mass, midline shift, or extra-axial fluid collection is identified. Vascular: Major intracranial vascular flow voids are preserved. Skull and upper cervical spine: Unremarkable bone marrow signal. Sinuses/Orbits: Unremarkable orbits. Small volume secretions in a right ethmoid air cell. Trace right mastoid fluid. Other: None. IMPRESSION: 1. Acute left thalamic infarct. 2. Mildly to moderately age advanced chronic small vessel ischemic disease. Electronically Signed   By: Logan Bores M.D.   On: 12/03/2021 19:49   CT HEAD WO CONTRAST  Addendum Date: 12/03/2021   ADDENDUM REPORT: 12/03/2021 17:42 ADDENDUM: Original report by Dr. Autumn Patty. Addendum by Dr. Jeralyn Ruths on 12/03/2021 at 5:40 p.m.: There is a lacunar infarct involving the lateral aspect of the left thalamus which may be acute given the presenting symptoms. Neurology is aware. Electronically Signed   By: Logan Bores M.D.   On: 12/03/2021 17:42   Result Date: 12/03/2021 CLINICAL DATA:  Right-sided numbness, off balance and slurred speech beginning 2 days ago. EXAM: CT HEAD WITHOUT CONTRAST TECHNIQUE: Contiguous axial images were obtained from the base  of the skull through the vertex without intravenous contrast. RADIATION DOSE REDUCTION: This exam was performed according to the departmental dose-optimization program which includes automated exposure control, adjustment of the mA and/or kV according to patient size and/or use of iterative reconstruction technique. COMPARISON:  03/11/2016 FINDINGS: Brain: No evidence of acute infarction, hemorrhage, hydrocephalus, extra-axial collection or mass lesion/mass effect. Vascular: No hyperdense vessel or unexpected calcification. Skull: Normal. Negative for fracture or focal lesion. Sinuses/Orbits: Globes and orbits are unremarkable. Visualized sinuses are clear. Other: None peer IMPRESSION: Normal unenhanced CT scan of the brain. Electronically Signed: By: Lajean Manes M.D. On: 12/03/2021 14:01   CT ANGIO HEAD NECK W WO CM  Result Date: 12/03/2021 CLINICAL DATA:  Stroke follow-up. Right-sided numbness, gait disturbance, and slurred speech. EXAM: CT ANGIOGRAPHY HEAD AND NECK TECHNIQUE: Multidetector CT imaging of the head and neck was performed using the standard protocol during bolus administration of intravenous contrast. Multiplanar CT image reconstructions and MIPs were obtained to evaluate the vascular anatomy. Carotid stenosis measurements (when applicable) are obtained utilizing NASCET criteria, using the distal internal carotid diameter as the denominator. RADIATION DOSE REDUCTION: This exam was performed according to the departmental dose-optimization program which includes automated exposure control, adjustment of the mA and/or kV according to patient size and/or use of iterative reconstruction technique. CONTRAST:  21mL OMNIPAQUE IOHEXOL 350 MG/ML SOLN COMPARISON:  Soft tissue neck CT 04/10/2012 FINDINGS: CTA NECK FINDINGS Aortic arch: Standard 3 vessel aortic arch with widely patent arch vessel origins. Right carotid system: Patent without evidence of stenosis, dissection, or significant atherosclerosis.  Tortuous distal cervical ICA. Left carotid system: Patent without evidence of stenosis, dissection, or significant atherosclerosis. Tortuous mid cervical ICA. Vertebral arteries: Patent with the right being dominant. Limited assessment of the left V1 segment due to refluxed venous contrast. No evidence of a significant stenosis or dissection elsewhere involving either vertebral artery. Skeleton: Edentulous.  Advanced disc degeneration  at C5-6 and C6-7. Other neck: No evidence of cervical lymphadenopathy or mass. Upper chest: No apical lung consolidation or mass. Review of the MIP images confirms the above findings CTA HEAD FINDINGS Anterior circulation: The internal carotid arteries are widely patent from skull base to carotid termini. ACAs and MCAs are patent without evidence a proximal branch occlusion or significant proximal stenosis. There is a 2-3 mm aneurysm projecting laterally from the left MCA trifurcation. Posterior circulation: The intracranial vertebral arteries are widely patent to the basilar, with the left being particularly hypoplastic distal to the PICA origin. Patent PICA and SCA origins are seen bilaterally. The basilar artery is widely patent. There are left larger than right posterior communicating arteries. The PCAs are patent with mild irregularity but no flow limiting proximal stenosis. No aneurysm is identified. Venous sinuses: As permitted by contrast timing, patent. Anatomic variants: None. Review of the MIP images confirms the above findings IMPRESSION: 1. No large vessel occlusion or significant proximal stenosis in the head and neck. 2. 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 Electronically Signed   By: Logan Bores M.D.   On: 12/03/2021 17:38    Micro Results    No results found for this or any previous visit (from the past 240 hour(s)).  Today   Subjective    Ann Jarvis today has no headache,no chest abdominal pain,no new weakness tingling or numbness, feels much  better wants to go home today.    Objective   Blood pressure (!) 147/75, pulse (!) 56, temperature 99 F (37.2 C), temperature source Oral, resp. rate 18, height 5\' 11"  (1.803 m), weight 62.5 kg, last menstrual period 10/07/2015, SpO2 (!) 81 %.  No intake or output data in the 24 hours ending 12/28/21 1702  Exam  Awake Alert, No new F.N deficits,    [place].AT,PERRAL Supple Neck,   Symmetrical Chest wall movement, Good air movement bilaterally, CTAB RRR,No Gallops,   +ve B.Sounds, Abd Soft, Non tender,  No Cyanosis, Clubbing or edema    Data Review   Recent Labs  Lab 12/24/21 0841 12/24/21 2054 12/25/21 1410 12/26/21 0322 12/26/21 1257 12/27/21 0245 12/27/21 1113 12/27/21 1825 12/28/21 0205 12/28/21 1126  WBC 7.9   < > 5.0 4.6  --  6.0 7.1  --  5.5  --   HGB 6.4*   < > 9.8* 8.7*   < > 8.5* 9.0* 9.2* 9.0* 10.0*  HCT 20.4*   < > 31.7* 27.6*   < > 26.2* 28.5* 28.0* 27.8* 31.8*  PLT 272   < > 289 259  --  273 280  --  265  --   MCV 94.0   < > 90.6 90.8  --  88.8 89.3  --  88.8  --   MCH 29.5   < > 28.0 28.6  --  28.8 28.2  --  28.8  --   MCHC 31.4   < > 30.9 31.5  --  32.4 31.6  --  32.4  --   RDW 14.9   < > 15.1 15.1  --  14.6 14.4  --  14.5  --   LYMPHSABS 0.7  --  0.9 1.1  --  1.2  --   --  1.1  --   MONOABS 0.4  --  0.3 0.4  --  0.4  --   --  0.5  --   EOSABS 0.1  --  0.1 0.2  --  0.2  --   --  0.2  --  BASOSABS 0.0  --  0.0 0.0  --  0.0  --   --  0.0  --    < > = values in this interval not displayed.    Recent Labs  Lab 12/24/21 0725 12/24/21 0746 12/25/21 0341 12/26/21 0322 12/27/21 0245 12/28/21 0205  NA 138 138 139 142 138 137  K 3.6 3.6 3.6 3.8 3.8 3.5  CL 108 103 110 110 104 104  CO2 23  --  22 23 25 25   GLUCOSE 122* 119* 88 89 104* 102*  BUN 11 12 10 7 11 12   CREATININE 1.32* 1.40* 1.04* 0.94 1.08* 1.19*  CALCIUM 8.6*  --  8.4* 8.9 9.2 8.6*  AST 21  --   --   --   --   --   ALT 15  --   --   --   --   --   ALKPHOS 48  --   --   --   --   --    BILITOT 0.6  --   --   --   --   --   ALBUMIN 3.1*  --   --   --   --   --   MG  --   --   --  1.9 1.9 1.8  INR 1.1  --   --   --   --   --   TSH  --   --  16.524*  --   --   --     Total Time in preparing paper work, data evaluation and todays exam - 35 minutes  Lala Lund M.D on 12/28/2021 at [place] PM  [facility]

## 2021-12-28 NOTE — Progress Notes (Addendum)
Pt's lead set that was placed for her capsule endoscopy lost power; Vista Lawman, RN was paged twice. Will continue to monitor.   Elaina Hoops, RN

## 2021-12-28 NOTE — Progress Notes (Addendum)
Patient ID: Ann Jarvis, female   DOB: 11-14-1965, 56 y.o.   MRN: [id number]    Progress Note   Subjective   Day # 5  CC; melena, anemia-very recent CVA  Capsule endoscopy today-no active bleeding, no heme noted in the small bowel-1 AVM found in the more distal small bowel about 1 hour proximal to the first cecal image.  Today-hgb10/hematocrit 31.8 stable,  Patient says she feels fine, looking forward to going home-stool dark brown, not black     Objective   Vital signs in last 24 hours: Temp:  [98.8 F (37.1 C)-99.1 F (37.3 C)] 99 F (37.2 C) (09/29 1446) Pulse Rate:  [56-71] 56 (09/29 1446) Resp:  [18] 18 (09/29 1446) BP: (131-152)/(67-81) 147/75 (09/29 1446) SpO2:  [81 %-99 %] 81 % (09/29 1446) Last BM Date : 12/26/21 General:   AA female  in NAD Heart:  Regular rate and rhythm; no murmurs Lungs: Respirations even and unlabored, lungs CTA bilaterally Abdomen:  Soft, nontender and nondistended. Normal bowel sounds. Extremities:  Without edema. Neurologic:  Alert and oriented,  grossly normal neurologically. Psych:  Cooperative. Normal mood and affect.  Intake/Output from previous day: 09/28 0701 - 09/29 0700 In: 3 [I.V.:3] Out: -  Intake/Output this shift: No intake/output data recorded.  Lab Results: Recent Labs    12/27/21 0245 12/27/21 1113 12/27/21 1825 12/28/21 0205 12/28/21 1126  WBC 6.0 7.1  --  5.5  --   HGB 8.5* 9.0* 9.2* 9.0* 10.0*  HCT 26.2* 28.5* 28.0* 27.8* 31.8*  PLT 273 280  --  265  --    BMET Recent Labs    12/26/21 0322 12/27/21 0245 12/28/21 0205  NA 142 138 137  K 3.8 3.8 3.5  CL 110 104 104  CO2 23 25 25   GLUCOSE 89 104* 102*  BUN 7 11 12   CREATININE 0.94 1.08* 1.19*  CALCIUM 8.9 9.2 8.6*   LFT No results for input(s): "PROT", "ALBUMIN", "AST", "ALT", "ALKPHOS", "BILITOT", "BILIDIR", "IBILI" in the last 72 hours. PT/INR No results for input(s): "LABPROT", "INR" in the last 72 hours.  Studies/Results: No results  found.     Assessment / Plan:    #40 56 year old African-American female admitted with GI bleeding, melena and anemia in the setting of aspirin and Plavix for very recent CVA earlier this month associated right-sided weakness.  Colonoscopy and EGD unrevealing Capsule endoscopy with no blood in the small bowel, only pertinent finding is an AVM in the more distal small bowel about an hour proximal to the first cecal image  This certainly may have been the culprit, she could also have other AVMs not seen on this capsule endoscopy.  No further active bleeding and hemoglobin stable  #2 active cocaine abuse-cautioned again today to discontinue as high risk for recurrent CVA  #3 hypertension #4 acute kidney injury improved  Plan; OK for discharge today from GI perspective Patient aware to watch for melena and weakness and to return to the hospital should she have recurrent issues. Hospitalist note patient is going to be discharged on aspirin only, no Plavix, and continue PPI         Principal Problem:   GI bleed Active Problems:   Essential hypertension   Hypothyroidism   Recent cerebrovascular accident (CVA)   Acute blood loss anemia   AKI (acute kidney injury) ([place])   Polysubstance abuse ([place])   Melena   AVM (arteriovenous malformation) of small bowel, acquired with hemorrhage     LOS: 3  days   Amy Esterwood PA-C 12/28/2021, 4:48 PM  I have taken an interval history, thoroughly reviewed the chart and examined the patient. I agree with the Advanced Practitioner's note, impression and recommendations, and have recorded additional findings, impressions and recommendations below. I performed a substantive portion of this encounter (>50% time spent), including a complete performance of the medical decision making.  My additional thoughts are as follows:  Probable small bowel AVM bleed, now resolved.  I agree with the plan for her to be discharged on aspirin without Plavix.   She unquestionably needs to completely avoid cocaine and other illicit drugs to decrease the chance of having a recurrent CVA that may then require reinstitution of DAPT.   Charlie Pitter III Office:[phone]

## 2021-12-28 NOTE — Progress Notes (Signed)
PROGRESS NOTE                                                                                                                                                                                                             Patient Demographics:    Ann Jarvis, is a 56 y.o. female, DOB - Jan 28, 1966, WPY:[id number]  Outpatient Primary MD for the patient is Marcine Matar, MD    LOS - 3  Admit date - 12/24/2021    Chief Complaint  Patient presents with   Numbness       Brief Narrative (HPI from H&P)   56 y.o. female with medical history significant of hypertension, CVA, Graves' disease s/p RAI ablation 2011, tobacco abuse, and cocaine abuse who presents with complaints of right arm pain for the last 2 to 4 days.  She reports having achy pain from her fingers all the way into her shoulder on the right arm.  Reported similar pain during recent hospitalization from 9/4-9/6 when she was found to found to have a left thalamic infarct.  She is evaluated by neurology and started on aspirin and Plavix, now presents with weakness some numbness in extremities along with history of black stools for the last week.  In the ER she was found to have melanotic stools which were heme positive with severe anemia.   Subjective:   Patient in bed, appears comfortable, denies any headache, no fever, no chest pain or pressure, no shortness of breath , no abdominal pain. No new focal weakness.   Assessment  & Plan :    Acute blood loss related anemia due to acute upper GI bleed - she received 2 units of packed RBC transfusion, aspirin and Plavix have been held, on IV PPI, further bleeding seems to have abated, GI consulted EGD on 12/25/2021 and colonoscopy on 12/26/2021 were unrevealing, continue to monitor CBC, no signs of large active bleeding, she has now been given capsule endoscopy, follow results.  HX of recent stroke with right-sided weakness.   Almost completely resolved. Her this was discussed by admitting physician with the neurology team, resume aspirin once cleared by GI, continue statin for secondary prevention, her deficits have almost completely resolved, PT OT and monitor.  History of ongoing substance abuse including cocaine abuse.  Counseled to quit.  Essential hypertension.  Blood pressure medications  adjusted.  Avoiding beta-blocker due to ongoing cocaine abuse.  Dyslipidemia.  Continue statin.  Hypothyroidism.  Have increased Synthroid dose.  PCP to monitor closely.  AKI due to hypovolemia from blood loss, stabilized after transfusion.  Monitor.      Condition - Extremely Guarded  Family Communication  :  None  Code Status :  Full  Consults  :  GI, Neuro over the phone by admitting physician  PUD Prophylaxis : PPI   Procedures  :     CT head and MRI brain.  Nonacute.  EGD.   Impression:               - Normal esophagus.                           - Normal stomach.                           - Normal examined duodenum.                           - No specimens collected.                           No fresh or old blood in the UGI tract. Recommendation:           - Return patient to hospital ward for ongoing care.                           - Clear liquid diet.                           - Perform a colonoscopy tomorrow.   Colonoscopy.  Impression:               - Preparation of the colon was poor.                           - Stool in the entire examined colon. Poor                            visualization despite lavage. No apparent source of                            bleeding in colon, given limitations of prep.                           - The examination was otherwise normal.                           - No specimens collected. Recommendation:           - Return patient to hospital ward for ongoing care.                           - Resume regular diet.                           - To visualize the  small bowel, perform video  capsule endoscopy tomorrow.       Disposition Plan  :    Status is: Observation  DVT Prophylaxis  :    SCDs Start: 12/24/21 1242    Lab Results  Component Value Date   PLT 265 12/28/2021    Diet :  Diet Order             Diet regular Room service appropriate? Yes; Fluid consistency: Thin  Diet effective ____                    Inpatient Medications  Scheduled Meds:  amLODipine  10 mg Oral Daily   atorvastatin  40 mg Oral Daily   hydrALAZINE  100 mg Oral Q8H   isosorbide mononitrate  30 mg Oral Daily   levothyroxine  200 mcg Oral QAC breakfast   lidocaine  1-3 patch Transdermal Q24H   losartan  50 mg Oral Daily   melatonin  3 mg Oral QHS   sodium chloride flush  3 mL Intravenous Q12H   Continuous Infusions: PRN Meds:.acetaminophen **OR** acetaminophen, albuterol, hydrALAZINE, ondansetron, mouth rinse  Antibiotics  :    Anti-infectives (From admission, onward)    None        Time Spent in minutes  30   Susa Raring M.D on 12/28/2021 at 10:56 AM  To page go to [email or web address]   [facility] -  Office  [phone]  See all Orders from today for further details    Objective:   Vitals:   12/27/21 1513 12/27/21 2033 12/28/21 0317 12/28/21 0947  BP: (!) 145/85 131/67 (!) 144/67 (!) 147/79  Pulse:  71 63   Resp:   18   Temp:  99.1 F (37.3 C) 98.8 F (37.1 C)   TempSrc:  Oral Oral   SpO2:  99% 99%   Weight:      Height:        Wt Readings from Last 3 Encounters:  12/25/21 62.5 kg  08/21/20 64.6 kg  12/28/19 69.9 kg    No intake or output data in the 24 hours ending 12/28/21 1056    Physical Exam  Awake Alert, No new F.N deficits, Normal affect [place].AT,PERRAL Supple Neck, No JVD,   Symmetrical Chest wall movement, Good air movement bilaterally, CTAB RRR,No Gallops, Rubs or new Murmurs,  +ve B.Sounds, Abd Soft, No tenderness,   No Cyanosis, Clubbing or edema       Data Review:    CBC Recent Labs  Lab 12/24/21 0841 12/24/21 2054 12/25/21 1410 12/26/21 0322 12/26/21 1257 12/26/21 1827 12/27/21 0245 12/27/21 1113 12/27/21 1825 12/28/21 0205  WBC 7.9   < > 5.0 4.6  --   --  6.0 7.1  --  5.5  HGB 6.4*   < > 9.8* 8.7*   < > 9.3* 8.5* 9.0* 9.2* 9.0*  HCT 20.4*   < > 31.7* 27.6*   < > 28.7* 26.2* 28.5* 28.0* 27.8*  PLT 272   < > 289 259  --   --  273 280  --  265  MCV 94.0   < > 90.6 90.8  --   --  88.8 89.3  --  88.8  MCH 29.5   < > 28.0 28.6  --   --  28.8 28.2  --  28.8  MCHC 31.4   < > 30.9 31.5  --   --  32.4 31.6  --  32.4  RDW 14.9   < > 15.1 15.1  --   --  14.6 14.4  --  14.5  LYMPHSABS 0.7  --  0.9 1.1  --   --  1.2  --   --  1.1  MONOABS 0.4  --  0.3 0.4  --   --  0.4  --   --  0.5  EOSABS 0.1  --  0.1 0.2  --   --  0.2  --   --  0.2  BASOSABS 0.0  --  0.0 0.0  --   --  0.0  --   --  0.0   < > = values in this interval not displayed.    Electrolytes Recent Labs  Lab 12/24/21 0725 12/24/21 0746 12/25/21 0341 12/26/21 0322 12/27/21 0245 12/28/21 0205  NA 138 138 139 142 138 137  K 3.6 3.6 3.6 3.8 3.8 3.5  CL 108 103 110 110 104 104  CO2 23  --  22 23 25 25   GLUCOSE 122* 119* 88 89 104* 102*  BUN 11 12 10 7 11 12   CREATININE 1.32* 1.40* 1.04* 0.94 1.08* 1.19*  CALCIUM 8.6*  --  8.4* 8.9 9.2 8.6*  AST 21  --   --   --   --   --   ALT 15  --   --   --   --   --   ALKPHOS 48  --   --   --   --   --   BILITOT 0.6  --   --   --   --   --   ALBUMIN 3.1*  --   --   --   --   --   MG  --   --   --  1.9 1.9 1.8  INR 1.1  --   --   --   --   --   TSH  --   --  16.524*  --   --   --     ------------------------------------------------------------------------------------------------------------------ No results for input(s): "CHOL", "HDL", "LDLCALC", "TRIG", "CHOLHDL", "LDLDIRECT" in the last 72 hours.  Lab Results  Component Value Date   HGBA1C 4.8 12/04/2021    No results for input(s): "TSH", "T4TOTAL", "T3FREE",  "THYROIDAB" in the last 72 hours.  Invalid input(s): "FREET3"    Radiology Reports MR BRAIN WO CONTRAST  Result Date: 12/24/2021 CLINICAL DATA:  Neuro deficit, acute, stroke suspected EXAM: MRI HEAD WITHOUT CONTRAST TECHNIQUE: Multiplanar, multiecho pulse sequences of the brain and surrounding structures were obtained without intravenous contrast. COMPARISON:  CT head the same day.  MRI head 12/03/2021. FINDINGS: Motion limited study. Brain: No acute infarction, acute hemorrhage, hydrocephalus, extra-axial collection or mass lesion. Scattered T2/FLAIR hyperintensities in the white matter. Punctate focus susceptibility artifact in left thalamus, suggestive of prior infarct. Vascular: Major arterial flow voids maintained at the skull base. Skull and upper cervical spine: Normal marrow signal. Sinuses/Orbits: Clear sinuses.  Neck overall findings. Other: No mastoid effusions. IMPRESSION: 1. No evidence of acute intracranial abnormality on this motion limited study. 2. Scattered T2/FLAIR hyperintensities in the white matter, nonspecific but compatible with chronic microvascular ischemic disease. Chronic demyelination is a differential consideration. Electronically Signed   By: Margaretha Sheffield M.D.   On: 12/24/2021 16:01

## 2021-12-28 NOTE — TOC Progression Note (Signed)
Transition of Care [facility]) - Progression Note    Patient Details  Name: Ann Jarvis MRN: [id number] Date of Birth: 08-27-1965  Transition of [facility] CM/SW [place], [place] Phone Number: 12/28/2021, 3:45 PM  Clinical Narrative:     CSW received consult for food resources for patient. Patient confirmed she received resources for food. Assessment completed. Patient reports she will need transportation at dc. All questions answered no further questions reported at this time.       Expected Discharge Plan and Services                                                 Social Determinants of Health (SDOH) Interventions Food Insecurity Interventions: Other (Comment) (resources were provided) Transportation Interventions: Other (Comment) (TOC will assist with transportation at dc)  Readmission Risk Interventions     No data to display

## 2021-12-30 ENCOUNTER — Encounter (HOSPITAL_COMMUNITY): Payer: Self-pay | Admitting: Gastroenterology

## 2021-12-31 ENCOUNTER — Telehealth: Payer: Self-pay

## 2021-12-31 NOTE — Telephone Encounter (Signed)
Transition Care Management Follow-up Telephone Call Date of discharge and from where: 12/28/2021 [facility] How have you been since you were released from the hospital? She stated she is feeling much better.  Any questions or concerns? No  Items Reviewed: Did the pt receive and understand the discharge instructions provided? Yes  Medications obtained and verified? Yes -she has all of her medications except the levothyroxine and she said will take the prescription to [facility] today.  Other? No  Any new allergies since your discharge? No  Dietary orders reviewed? Yes Do you have support at home? Yes   Home Care and Equipment/Supplies: Were home health services ordered? no If so, what is the name of the agency? N/a  Has the agency set up a time to come to the patient's home? not applicable Were any new equipment or medical supplies ordered?  No What is the name of the medical supply agency? N/a Were you able to get the supplies/equipment? not applicable Do you have any questions related to the use of the equipment or supplies? No  Functional Questionnaire: (I = Independent and D = Dependent) ADLs: independent. She said that she could use a cane.   Follow up appointments reviewed:  PCP Hospital f/u appt confirmed? Yes  Scheduled to see Dr Wynetta Emery- 01/22/2022  Specialist Hospital f/u appt confirmed? Yes  Scheduled to see neurology - 01/10/2022.  Are transportation arrangements needed? Yes - I provided her with the phone number for [facility] transportation [phone]. If their condition worsens, is the pt aware to call PCP or go to the Emergency Dept.? Yes Was the patient provided with contact information for the PCP's office or ED? Yes Was to pt encouraged to call back with questions or concerns? Yes

## 2022-01-01 ENCOUNTER — Other Ambulatory Visit: Payer: Self-pay

## 2022-01-04 ENCOUNTER — Encounter: Payer: Self-pay | Admitting: Gastroenterology

## 2022-01-09 ENCOUNTER — Other Ambulatory Visit: Payer: Self-pay

## 2022-01-09 NOTE — Progress Notes (Deleted)
[facility] [address] [place] [address] [phone]       HOSPITAL FOLLOW UP NOTE  [name] Date of Birth:  02-27-1966 Medical Record Number:  [id number]   Reason for Referral:  hospital stroke follow up    SUBJECTIVE:   CHIEF COMPLAINT:  No chief complaint on file.   HPI:   [name] is a 56 y.o. female with history of polysubstance abuse including marijuana,cocaine, tobacco abuse, hypertension with RX non compliance.(states ran out of medication 2 months ago), history of graves disease s/p RAI ablation, HTN, HDL who presented with 12/03/2021 with numbness on right side of her body with mild weakness, speech difficulty that was dysarthric and balance problems with ambulation the day prior to admission.  Personally reviewed hospitalization pertinent for results, lab work imaging.  Evaluated by Dr. Erlinda Hong for left thalamic infarct secondary to small vessel disease given multiple uncontrolled risk factors.  CTA head/neck unremarkable.  EF 55 to 60%.  LDL 88, A1c 4.8.  UDS positive for cocaine.  Recommended DAPT for 3 weeks and aspirin alone as well as restart atorvastatin 40mg  daily.  Discussed importance of BP med compliant , added amlodipine and increase labetalol dosage and continued on hydrochlorothiazide and losartan.  Cocaine, tobacco and THC cessation counseling provided, EtOH cessation also provided.  Therapy eval's recommended outpatient neuro rehab.        PERTINENT IMAGING    Stroke -acute left thalamic stroke secondary to small vessel disease Head CT left thalamic subacute infarct CTA neck/neck unremarkable MRI left thalamic infarct 2D Echo 55 to 60% LDL 80 HgbA1c 4.8    ROS:   14 system review of systems performed and negative with exception of ***  PMH:  Past Medical History:  Diagnosis Date   Cocaine abuse ([place])    Grave's disease    s/p radioiodine ablation 03/05/10, F/B Dr. Carlis Abbott. On Methimazole    Hypertension    Scabies    Tobacco abuse     PSH:  Past Surgical History:  Procedure Laterality Date   BTL     COLONOSCOPY WITH PROPOFOL N/A 12/26/2021   Procedure: COLONOSCOPY WITH PROPOFOL;  Surgeon: Doran Stabler, MD;  Location: [facility];  Service: Gastroenterology;  Laterality: N/A;   ESOPHAGOGASTRODUODENOSCOPY (EGD) WITH PROPOFOL N/A 12/25/2021   Procedure: ESOPHAGOGASTRODUODENOSCOPY (EGD) WITH PROPOFOL;  Surgeon: Doran Stabler, MD;  Location: [facility];  Service: Gastroenterology;  Laterality: N/A;   GIVENS CAPSULE STUDY N/A 12/27/2021   Procedure: GIVENS CAPSULE STUDY;  Surgeon: Doran Stabler, MD;  Location: [facility];  Service: Gastroenterology;  Laterality: N/A;   MOUTH SURGERY      Social History:  Social History   Socioeconomic History   Marital status: Single    Spouse name: Not on file   Number of children: Not on file   Years of education: Not on file   Highest education level: Not on file  Occupational History   Not on file  Tobacco Use   Smoking status: Every Day    Packs/day: 0.25    Types: Cigarettes   Smokeless tobacco: Never   Tobacco comments:    1 cig a day  Substance and Sexual Activity   Alcohol use: Yes    Alcohol/week: 1.0 - 2.0 standard drink of alcohol    Types: 1 - 2 Cans of beer per week    Comment: occ   Drug use: Yes    Types: Marijuana, Cocaine  Comment: marijuana   Sexual activity: Not on file  Other Topics Concern   Not on file  Social History Narrative   Smokes 1 pack/week x 20 years, smokes marijuna, no cocaine in the last year.  Works for [employer].      Financial assistance approved for 100% discount at [facility] and has [facility] [employer] [facility] card Bonna Gains Aug 01 2009 8.58am   Financial assistance approved for 100% discount at [facility] and has [facility] card Neoma Laming February 02, 2010 12.20pm   Social Determinants of Health   Financial Resource Strain: Not on file  Food Insecurity: Food Insecurity Present (12/28/2021)    Hunger Vital Sign    Worried About Running Out of Food in the Last Year: Sometimes true    Ran Out of Food in the Last Year: Sometimes true  Transportation Needs: Unmet Transportation Needs (12/28/2021)   PRAPARE - Hydrologist (Medical): Yes    Lack of Transportation (Non-Medical): Yes  Physical Activity: Not on file  Stress: Not on file  Social Connections: Not on file  Intimate Partner Violence: Not At Risk (12/04/2021)   Humiliation, Afraid, Rape, and Kick questionnaire    Fear of Current or Ex-Partner: No    Emotionally Abused: No    Physically Abused: No    Sexually Abused: No    Family History: No family history on file.  Medications:   Current Outpatient Medications on File Prior to Visit  Medication Sig Dispense Refill   acetaminophen (TYLENOL) 500 MG tablet Take 1,000 mg by mouth 3 (three) times daily.     amLODipine (NORVASC) 10 MG tablet Take 1 tablet (10 mg total) by mouth daily. 30 tablet 0   amLODipine (NORVASC) 5 MG tablet Take 1 tablet (5 mg total) by mouth daily. 30 tablet 2   aspirin EC 81 MG tablet Take 1 tablet (81 mg total) by mouth daily. Swallow whole. 30 tablet 12   atorvastatin (LIPITOR) 40 MG tablet Take 1 tablet (40 mg total) by mouth daily. 30 tablet 2   carvedilol (COREG) 6.25 MG tablet Take 1 tablet (6.25 mg total) by mouth 2 (two) times daily. 60 tablet 0   hydrALAZINE (APRESOLINE) 100 MG tablet Take 1 tablet (100 mg total) by mouth every 8 (eight) hours. 90 tablet 0   isosorbide mononitrate (IMDUR) 30 MG 24 hr tablet Take 1 tablet (30 mg total) by mouth daily. 30 tablet 0   levothyroxine (SYNTHROID) 200 MCG tablet Take 1 tablet (200 mcg total) by mouth daily before breakfast. 30 tablet 0   losartan (COZAAR) 50 MG tablet Take 1 tablet (50 mg total) by mouth daily. 30 tablet 0   pantoprazole (PROTONIX) 40 MG tablet Take 1 tablet (40 mg total) by mouth 2 (two) times daily before a meal. 60 tablet 2   thiamine (VITAMIN B-1)  100 MG tablet Take 1 tablet (100 mg total) by mouth daily. (Patient not taking: Reported on 12/24/2021)     No current facility-administered medications on file prior to visit.    Allergies:   Allergies  Allergen Reactions   Ciprofloxacin Rash and Other (See Comments)    Significant rash, taking with Flagyl   Flagyl [Metronidazole] Rash and Other (See Comments)    Developed significant rash while taking with Cipro   Penicillins Swelling    Did it involve swelling of the face/tongue/throat, SOB, or low BP? No Did it involve sudden or severe rash/hives, skin peeling, or any reaction on the inside of  your mouth or nose? No Did you need to seek medical attention at a hospital or doctor's office? No When did it last happen?  Pt states a long time ago     If all above answers are "NO", may proceed with cephalosporin use.       OBJECTIVE:  Physical Exam  There were no vitals filed for this visit. There is no height or weight on file to calculate BMI. No results found.     12/28/2019    4:18 PM  Depression screen PHQ 2/9  Decreased Interest 0  Down, Depressed, Hopeless 0  PHQ - 2 Score 0     General: well developed, well nourished, seated, in no evident distress Head: head normocephalic and atraumatic.   Neck: supple with no carotid or supraclavicular bruits Cardiovascular: regular rate and rhythm, no murmurs Musculoskeletal: no deformity Skin:  no rash/petichiae Vascular:  Normal pulses all extremities   Neurologic Exam Mental Status: Awake and fully alert. Oriented to place and time. Recent and remote memory intact. Attention span, concentration and fund of knowledge appropriate. Mood and affect appropriate.  Cranial Nerves: Fundoscopic exam reveals sharp disc margins. Pupils equal, briskly reactive to light. Extraocular movements full without nystagmus. Visual fields full to confrontation. Hearing intact. Facial sensation intact. Face, tongue, palate moves normally and  symmetrically.  Motor: Normal bulk and tone. Normal strength in all tested extremity muscles Sensory.: intact to touch , pinprick , position and vibratory sensation.  Coordination: Rapid alternating movements normal in all extremities. Finger-to-nose and heel-to-shin performed accurately bilaterally. Gait and Station: Arises from chair without difficulty. Stance is normal. Gait demonstrates normal stride length and balance with ***. Tandem walk and heel toe ***.  Reflexes: 1+ and symmetric. Toes downgoing.     NIHSS  *** Modified Rankin  ***      ASSESSMENT: Ann Jarvis is a 56 y.o. year old female with left thalamic infarct on 12/03/2021 secondary to small vessel disease thalamic multiple uncontrolled risk factors and medication noncompliance. Vascular risk factors include HTN, HLD, medication noncompliance, polysubstance abuse (cocaine, THC, tobacco and EtOH).      PLAN:  Left thalamic stroke:  Residual deficit: ***.  Continue aspirin 81mg  daily and atorvastatin (Lipitor) for secondary stroke prevention.   Discussed secondary stroke prevention measures and importance of close PCP follow up for aggressive stroke risk factor management including BP goal<130/90, HLD with LDL goal<70 and DM with A1c.<7 .  Stroke labs 11/2021: LDL 80, A1c 4.8 I have gone over the pathophysiology of stroke, warning signs and symptoms, risk factors and their management in some detail with instructions to go to the closest emergency room for symptoms of concern.     Follow up in *** or call earlier if needed   CC:  [facility] provider: Dr. Leonie Man PCP: Ladell Pier, MD    I spent *** minutes of face-to-face and non-face-to-face time with patient.  This included previsit chart review including review of recent hospitalization, lab review, study review, order entry, electronic health record documentation, patient education regarding recent stroke including etiology, secondary stroke prevention measures  and importance of managing stroke risk factors, residual deficits and typical recovery time and answered all other questions to patient satisfaction   Frann Rider, AGNP-BC  [facility] [address] [place], [place] [address]  Phone [phone] Fax [phone] Note: This document was prepared with digital dictation and possible smart phrase technology. Any transcriptional errors that result from this process are unintentional.

## 2022-01-10 ENCOUNTER — Inpatient Hospital Stay: Payer: Medicaid Other | Admitting: Adult Health

## 2022-01-10 ENCOUNTER — Encounter: Payer: Self-pay | Admitting: Adult Health

## 2022-01-16 ENCOUNTER — Other Ambulatory Visit: Payer: Self-pay

## 2022-01-22 ENCOUNTER — Ambulatory Visit: Payer: Medicaid Other | Admitting: Internal Medicine

## 2022-02-11 ENCOUNTER — Other Ambulatory Visit: Payer: Self-pay

## 2022-02-11 ENCOUNTER — Other Ambulatory Visit: Payer: Self-pay | Admitting: Internal Medicine

## 2022-02-11 MED ORDER — LOSARTAN POTASSIUM 50 MG PO TABS
50.0000 mg | ORAL_TABLET | Freq: Every day | ORAL | 0 refills | Status: DC
  Filled 2022-02-11 – 2022-02-18 (×2): qty 30, 30d supply, fill #0

## 2022-02-11 MED ORDER — LEVOTHYROXINE SODIUM 200 MCG PO TABS
200.0000 ug | ORAL_TABLET | Freq: Every day | ORAL | 0 refills | Status: DC
  Filled 2022-02-11 – 2022-02-18 (×2): qty 30, 30d supply, fill #0

## 2022-02-18 ENCOUNTER — Other Ambulatory Visit: Payer: Self-pay | Admitting: Internal Medicine

## 2022-02-18 ENCOUNTER — Other Ambulatory Visit: Payer: Self-pay

## 2022-02-18 MED ORDER — AMLODIPINE BESYLATE 10 MG PO TABS
10.0000 mg | ORAL_TABLET | Freq: Every day | ORAL | 0 refills | Status: DC
  Filled 2022-02-18: qty 10, 10d supply, fill #0

## 2022-02-18 NOTE — Telephone Encounter (Signed)
Requested medications are due for refill today.  unsure  Requested medications are on the active medications list.  yes  Last refill. 12/29/2021  Future visit scheduled.   yes  Notes to clinic.  Pr has rx's for both 5 and 10mg   Amlodipine.    Requested Prescriptions  Pending Prescriptions Disp Refills   amLODipine (NORVASC) 10 MG tablet 30 tablet 0    Sig: Take 1 tablet (10 mg total) by mouth daily.     Cardiovascular: Calcium Channel Blockers 2 Failed - 02/18/2022 11:21 AM      Failed - Last BP in normal range    BP Readings from Last 1 Encounters:  12/28/21 (!) 147/75         Failed - Valid encounter within last 6 months    Recent Outpatient Visits           1 year ago Essential hypertension   [facility] 12/30/21, MD   1 year ago Essential hypertension   [facility] Marcine Matar, MD   2 years ago Essential hypertension   [facility] [place], LONDON J, NP   2 years ago Gastroesophageal reflux disease without esophagitis   [facility] [place], MD   2 years ago Essential hypertension   [facility] Marcine Matar, MD       Future Appointments             In 1 week Marcine Matar [facility]            Passed - Last Heart Rate in normal range    Pulse Readings from Last 1 Encounters:  12/28/21 (!) 56

## 2022-02-19 ENCOUNTER — Other Ambulatory Visit: Payer: Self-pay

## 2022-02-21 ENCOUNTER — Other Ambulatory Visit: Payer: Self-pay

## 2022-02-21 ENCOUNTER — Emergency Department (HOSPITAL_COMMUNITY): Payer: Medicaid Other

## 2022-02-21 ENCOUNTER — Emergency Department (HOSPITAL_COMMUNITY)
Admission: EM | Admit: 2022-02-21 | Discharge: 2022-02-21 | Disposition: A | Discharge status: Home / Self Care | Payer: Medicaid Other | Attending: Emergency Medicine | Admitting: Emergency Medicine

## 2022-02-21 ENCOUNTER — Encounter (HOSPITAL_COMMUNITY): Payer: Self-pay

## 2022-02-21 DIAGNOSIS — S52501A Unspecified fracture of the lower end of right radius, initial encounter for closed fracture: Secondary | ICD-10-CM

## 2022-02-21 DIAGNOSIS — Z7982 Long term (current) use of aspirin: Secondary | ICD-10-CM | POA: Insufficient documentation

## 2022-02-21 DIAGNOSIS — I10 Essential (primary) hypertension: Secondary | ICD-10-CM | POA: Diagnosis not present

## 2022-02-21 DIAGNOSIS — S59201A Unspecified physeal fracture of lower end of radius, right arm, initial encounter for closed fracture: Secondary | ICD-10-CM | POA: Insufficient documentation

## 2022-02-21 DIAGNOSIS — S6991XA Unspecified injury of right wrist, hand and finger(s), initial encounter: Secondary | ICD-10-CM | POA: Diagnosis present

## 2022-02-21 DIAGNOSIS — Z79899 Other long term (current) drug therapy: Secondary | ICD-10-CM | POA: Insufficient documentation

## 2022-02-21 DIAGNOSIS — W010XXA Fall on same level from slipping, tripping and stumbling without subsequent striking against object, initial encounter: Secondary | ICD-10-CM | POA: Diagnosis not present

## 2022-02-21 MED ORDER — OXYCODONE-ACETAMINOPHEN 5-325 MG PO TABS: 2.0000 | ORAL_TABLET | Freq: Once | ORAL | Status: DC

## 2022-02-21 MED ORDER — OXYCODONE-ACETAMINOPHEN 5-325 MG PO TABS
1.0000 | ORAL_TABLET | ORAL | Status: DC | PRN
  Administered 2022-02-21: 1 via ORAL
  Filled 2022-02-21: qty 1

## 2022-02-21 MED ORDER — KETOROLAC TROMETHAMINE 15 MG/ML IJ SOLN: 15.0000 mg | Freq: Once | INTRAMUSCULAR | Status: DC

## 2022-02-21 MED ORDER — OXYCODONE-ACETAMINOPHEN 5-325 MG PO TABS
1.0000 | ORAL_TABLET | Freq: Four times a day (QID) | ORAL | 0 refills | Status: DC | PRN
  Filled 2022-02-21: qty 15, 4d supply, fill #0

## 2022-02-21 MED ORDER — OXYCODONE-ACETAMINOPHEN 5-325 MG PO TABS
1.0000 | ORAL_TABLET | Freq: Once | ORAL | Status: AC
  Administered 2022-02-21: 1 via ORAL
  Filled 2022-02-21: qty 1

## 2022-02-21 NOTE — ED Provider Notes (Signed)
[facility] EMERGENCY DEPARTMENT Provider Note   CSN: [id number] Arrival date & time: 02/21/22  0454     History  Chief Complaint  Patient presents with   Fall    Ann Jarvis is a 56 y.o. female.  56 year old right-hand-dominant female presents to the emergency department for right wrist pain.  She tripped on a shoestring and fell on an outstretched hand.  She has been having a throbbing in her right wrist which has been gradually worsening since onset.  She has not taken any medications for her pain prior to arrival.  No extremity numbness or paresthesias.  Denies head injury or trauma.  The history is provided by the patient. No language interpreter was used.  Fall       Home Medications Prior to Admission medications   Medication Sig Start Date End Date Taking? Authorizing Provider  oxyCODONE-acetaminophen (PERCOCET/ROXICET) 5-325 MG tablet Take 1 tablet by mouth every 6 (six) hours as needed for severe pain. 02/21/22  Yes Antony Madura, PA-C  acetaminophen (TYLENOL) 500 MG tablet Take 1,000 mg by mouth 3 (three) times daily.    [provider]  amLODipine (NORVASC) 10 MG tablet Take 1 tablet (10 mg total) by mouth daily. 02/18/22   Marcine Matar, MD  aspirin EC 81 MG tablet Take 1 tablet (81 mg total) by mouth daily. Swallow whole. 12/06/21   Pokhrel, Rebekah Chesterfield, MD  atorvastatin (LIPITOR) 40 MG tablet Take 1 tablet (40 mg total) by mouth daily. 12/06/21 03/21/22  Pokhrel, Rebekah Chesterfield, MD  carvedilol (COREG) 6.25 MG tablet Take 1 tablet (6.25 mg total) by mouth 2 (two) times daily. 12/28/21 01/31/22  Leroy Sea, MD  hydrALAZINE (APRESOLINE) 100 MG tablet Take 1 tablet (100 mg total) by mouth every 8 (eight) hours. 12/28/21   Leroy Sea, MD  isosorbide mononitrate (IMDUR) 30 MG 24 hr tablet Take 1 tablet (30 mg total) by mouth daily. 12/29/21   Leroy Sea, MD  levothyroxine (SYNTHROID) 200 MCG tablet Take 1 tablet (200 mcg total) by  mouth daily before breakfast. 02/11/22   Marcine Matar, MD  losartan (COZAAR) 50 MG tablet Take 1 tablet (50 mg total) by mouth daily. 02/11/22   Marcine Matar, MD  pantoprazole (PROTONIX) 40 MG tablet Take 1 tablet (40 mg total) by mouth 2 (two) times daily before a meal. 12/28/21   Leroy Sea, MD  thiamine (VITAMIN B-1) 100 MG tablet Take 1 tablet (100 mg total) by mouth daily. Patient not taking: Reported on 12/24/2021 12/06/21   Joycelyn Das, MD      Allergies    Ciprofloxacin, Flagyl [metronidazole], and Penicillins    Review of Systems   Review of Systems Ten systems reviewed and are negative for acute change, except as noted in the HPI.    Physical Exam Updated Vital Signs BP (!) 179/78   Pulse 68   Temp 98 F (36.7 C) (Oral)   Resp 18   Ht 5\' 11"  (1.803 m)   Wt 68 kg   LMP 10/07/2015   SpO2 100%   BMI 20.92 kg/m   Physical Exam Vitals and nursing note reviewed.  Constitutional:      General: She is not in acute distress.    Appearance: She is well-developed. She is not diaphoretic.     Comments: Nontoxic appearing and in NAD  HENT:     Head: Normocephalic and atraumatic.  Eyes:     General: No scleral icterus.  Conjunctiva/sclera: Conjunctivae normal.  Cardiovascular:     Rate and Rhythm: Normal rate and regular rhythm.     Pulses: Normal pulses.     Comments: Distal radial pulse 2+ in the RUE. Extremity is warm and well perfused. Pulmonary:     Effort: Pulmonary effort is normal. No respiratory distress.  Musculoskeletal:     Right wrist: Swelling, tenderness and bony tenderness present. No crepitus. Decreased range of motion. Normal pulse.     Cervical back: Normal range of motion.  Skin:    General: Skin is warm and dry.     Coloration: Skin is not pale.     Findings: No erythema or rash.  Neurological:     Mental Status: She is alert and oriented to person, place, and time.  Psychiatric:        Behavior: Behavior normal.     ED  Results / Procedures / Treatments   Labs (all labs ordered are listed, but only abnormal results are displayed) Labs Reviewed - No data to display  EKG None  Radiology DG Wrist Complete Right  Result Date: 02/21/2022 CLINICAL DATA:  Status post fall. EXAM: RIGHT WRIST - COMPLETE 3+ VIEW COMPARISON:  None Available. FINDINGS: There is an acute, nondisplaced intra-articular fracture deformity involving the distal aspect of the radius. The fracture fragments are in anatomic alignment. No signs of dislocation. Mild diffuse soft tissue edema. IMPRESSION: Acute, nondisplaced intra-articular fracture deformity involves the distal radius. Electronically Signed   By: Signa Kell M.D.   On: 02/21/2022 06:35   DG Hand Complete Right  Result Date: 02/21/2022 CLINICAL DATA:  56 year old female status post fall on concrete. EXAM: RIGHT HAND - COMPLETE 3+ VIEW COMPARISON:  None Available. FINDINGS: Right wrist is detailed separately. Bone mineralization is within normal limits for age. Widespread right hand IP joint osteoarthritis. No acute fracture or dislocation identified. No discrete soft tissue injury. IMPRESSION: 1. Right wrist detailed separately. 2. IP osteoarthritis. No acute fracture or dislocation identified in the right hand. Electronically Signed   By: Odessa Fleming M.D.   On: 02/21/2022 06:29    Procedures Procedures    Medications Ordered in ED Medications  oxyCODONE-acetaminophen (PERCOCET/ROXICET) 5-325 MG per tablet 1 tablet (1 tablet Oral Given 02/21/22 2836)    ED Course/ Medical Decision Making/ A&P Clinical Course as of 02/21/22 0647  Thu Feb 21, 2022  0615 X-ray reviewed by me.  Concerning for distal radius fracture.  Ordered for application of volar short arm splint. [KH]    Clinical Course User Index [KH] Antony Madura, PA-C                           Medical Decision Making Amount and/or Complexity of Data Reviewed Radiology: ordered.  Risk Prescription drug  management.   This patient presents to the ED for concern of R wrist pain, this involves an extensive number of treatment options, and is a complaint that carries with it a high risk of complications and morbidity.  The differential diagnosis includes sprain/strain vs fx vs septic joint vs joint dislocation.   Co morbidities that complicate the patient evaluation  HTN Cocaine abuse   Additional history obtained:  External records from outside source obtained and reviewed including prior creatinine function; baseline ~1.1-1.2   Imaging Studies ordered:  I ordered imaging studies including Xray R wrist and hand  I independently visualized and interpreted imaging which showed distal radius fx w/o displacement. I agree with  the radiologist interpretation   Cardiac Monitoring:  The patient was maintained on a cardiac monitor.  I personally viewed and interpreted the cardiac monitored which showed an underlying rhythm of: NSR   Medicines ordered and prescription drug management:  I ordered medication including percocet for pain  Reevaluation of the patient after these medicines showed that the patient improved I have reviewed the patients home medicines and have made adjustments as needed   Problem List / ED Course:  Patient neurovascularly intact on exam.  X-ray reviewed which shows findings consistent with distal radius fracture. Orders placed for volar splint and foam arm sling. Percocet given for pain control.   Reevaluation:  After the interventions noted above, I reevaluated the patient and found that they have :remained stable   Social Determinants of Health:  Insured patient (Medicaid) Established with PCP   Dispostion:  After consideration of the diagnostic results and the patients response to treatment, I feel that the patent would benefit from maintenance of volar splint pending Orthopedic follow up. Counseled on rest, elevation, icing. Return precautions  discussed and provided. Patient discharged in stable condition with no unaddressed concerns.          Final Clinical Impression(s) / ED Diagnoses Final diagnoses:  Closed fracture of distal end of right radius, unspecified fracture morphology, initial encounter    Rx / DC Orders ED Discharge Orders          Ordered    oxyCODONE-acetaminophen (PERCOCET/ROXICET) 5-325 MG tablet  Every 6 hours PRN        02/21/22 0625              Antony Madura, PA-C 02/21/22 1191    Zadie Rhine, MD 02/21/22 [phone]

## 2022-02-21 NOTE — Progress Notes (Signed)
Orthopedic Tech Progress Note Patient Details:  Ann Jarvis 07-Oct-1965 [id number]  Ortho Devices Type of Ortho Device: Volar splint, Sling immobilizer Ortho Device/Splint Location: RUE Ortho Device/Splint Interventions: Ordered, Application, Adjustment   Post Interventions Patient Tolerated: Fair Instructions Provided: Care of device  [name] 02/21/2022, 7:06 AM

## 2022-02-21 NOTE — ED Notes (Signed)
Ortho tech called 

## 2022-02-21 NOTE — ED Triage Notes (Addendum)
Pt tripped on shoestring and fell, pt went to catch self w/right foot and now has pain and swelling in right wrist.  +PMS in right hand

## 2022-02-21 NOTE — ED Notes (Signed)
DC instructions reviewed with pt. PT verbalized understanding. PT DC °

## 2022-02-21 NOTE — ED Notes (Signed)
Ortho tech at bedside 

## 2022-02-21 NOTE — Discharge Instructions (Signed)
We recommend Percocet as prescribed for management of pain.  Do not drive or drink alcohol after taking this medication as it may make you drowsy and impair your judgment.  Ice over top of your splint 3-4 times per day to help limit inflammation/swelling.  Continue to elevate your extremity to reduce pain and swelling, as well.  You have been given a referral to an orthopedic hand surgeon for outpatient follow-up to ensure proper healing of your broken bone.  Return to the ED for new or concerning symptoms.

## 2022-02-22 ENCOUNTER — Other Ambulatory Visit: Payer: Self-pay

## 2022-02-27 ENCOUNTER — Ambulatory Visit: Payer: Medicaid Other | Admitting: Physician Assistant

## 2022-03-09 ENCOUNTER — Emergency Department (HOSPITAL_COMMUNITY)
Admission: EM | Admit: 2022-03-09 | Discharge: 2022-03-09 | Disposition: A | Discharge status: Home / Self Care | Payer: Medicaid Other | Attending: Emergency Medicine | Admitting: Emergency Medicine

## 2022-03-09 ENCOUNTER — Emergency Department (HOSPITAL_COMMUNITY): Payer: Medicaid Other

## 2022-03-09 ENCOUNTER — Other Ambulatory Visit: Payer: Self-pay

## 2022-03-09 DIAGNOSIS — R7989 Other specified abnormal findings of blood chemistry: Secondary | ICD-10-CM | POA: Insufficient documentation

## 2022-03-09 DIAGNOSIS — R0789 Other chest pain: Secondary | ICD-10-CM | POA: Insufficient documentation

## 2022-03-09 LAB — BASIC METABOLIC PANEL
Anion gap: 11 (ref 5–15)
BUN: 16 mg/dL (ref 6–20)
CO2: 28 mmol/L (ref 22–32)
Calcium: 8.8 mg/dL — ABNORMAL LOW (ref 8.9–10.3)
Chloride: 99 mmol/L (ref 98–111)
Creatinine, Ser: 1.49 mg/dL — ABNORMAL HIGH (ref 0.44–1.00)
GFR, Estimated: 40 mL/min — ABNORMAL LOW (ref >60)
Glucose, Bld: 98 mg/dL (ref 70–99)
Potassium: 3.5 mmol/L (ref 3.5–5.1)
Sodium: 138 mmol/L (ref 135–145)

## 2022-03-09 LAB — CBC
HCT: 37.7 % (ref 36.0–46.0)
Hemoglobin: 11.4 g/dL — ABNORMAL LOW (ref 12.0–15.0)
MCH: 23 pg — ABNORMAL LOW (ref 26.0–34.0)
MCHC: 30.2 g/dL (ref 30.0–36.0)
MCV: 76.2 fL — ABNORMAL LOW (ref 80.0–100.0)
Platelets: 224 10*3/uL (ref 150–400)
RBC: 4.95 MIL/uL (ref 3.87–5.11)
RDW: 17.1 % — ABNORMAL HIGH (ref 11.5–15.5)
WBC: 4.5 10*3/uL (ref 4.0–10.5)
nRBC: 0 % (ref 0.0–0.2)

## 2022-03-09 LAB — TROPONIN I (HIGH SENSITIVITY)
Troponin I (High Sensitivity): 10 ng/L (ref <18)
Troponin I (High Sensitivity): 8 ng/L (ref <18)

## 2022-03-09 NOTE — ED Provider Notes (Signed)
[facility] Emergency Department Provider Note MRN:  [id number]  Arrival date & time: 03/09/22     Chief Complaint   Chest Pain   History of Present Illness   Ann Jarvis is a 56 y.o. year-old female presents to the ED with chief complaint of right-sided chest and rib pain.  She states that she tripped and fell about 2 weeks ago.  She broke her wrist, and has had some pain in her right ribs since.  She states that she has had some worsening tightness on the right side of her ribs over the past day or so.  She denies new injury.  She has history of prior stroke, and states that she tripped over her shoelace during the initial fall, and states that she remains somewhat unsteady because of her prior stroke.  History provided by patient.   Review of Systems  Pertinent positive and negative review of systems noted in HPI.    Physical Exam   Vitals:   03/09/22 0400 03/09/22 0415  BP: (!) 205/87 (!) 142/75  Pulse: (!) 51 (!) 53  Resp: 18 14  Temp:    SpO2: 97% 96%    CONSTITUTIONAL: Nontoxic-appearing, NAD NEURO:  Alert and oriented x 3, CN 3-12 grossly intact EYES:  eyes equal and reactive ENT/NECK:  Supple, no stridor  CARDIO: Slightly bradycardic, regular rhythm, appears well-perfused  PULM:  No respiratory distress, clear to auscultation, mild right rib and chest wall tenderness GI/GU:  non-distended, no focal abdominal tenderness MSK/SPINE:  No gross deformities, no edema, moves all extremities, right wrist is in a splint SKIN:  no rash, atraumatic   *Additional and/or pertinent findings included in MDM below  Diagnostic and Interventional Summary    EKG Interpretation  Date/Time:  Saturday March 09 2022 00:56:51 EST Ventricular Rate:  76 PR Interval:  168 QRS Duration: 76 QT Interval:  430 QTC Calculation: 483 R Axis:   71 Text Interpretation: Normal sinus rhythm Biatrial enlargement Prolonged QT Abnormal ECG Confirmed by Quintella Reichert [phone]) on 03/09/2022 1:36:23 AM       Labs Reviewed  BASIC METABOLIC PANEL - Abnormal; Notable for the following components:      Result Value   Creatinine, Ser 1.49 (*)    Calcium 8.8 (*)    GFR, Estimated 40 (*)    All other components within normal limits  CBC - Abnormal; Notable for the following components:   Hemoglobin 11.4 (*)    MCV 76.2 (*)    MCH 23.0 (*)    RDW 17.1 (*)    All other components within normal limits  TROPONIN I (HIGH SENSITIVITY)  TROPONIN I (HIGH SENSITIVITY)    CT CHEST WO CONTRAST  Final Result    DG Chest 2 View  Final Result    DG Ribs Unilateral Right  Final Result      Medications - No data to display   Procedures  /  Critical Care Procedures  ED Course and Medical Decision Making  I have reviewed the triage vital signs, the nursing notes, and pertinent available records from the EMR.  Social Determinants Affecting Complexity of Care: Patient has no clinically significant social determinants affecting this chief complaint..   ED Course:    Medical Decision Making Patient here with right-sided rib pain.  Initial onset of symptoms was after a fall about a week or 2 ago.  She states that she has had some right sided tightening in her chest.  Chest x-ray  and right rib films are negative.  Will check CT chest to rule out occult rib fracture.  CT chest is negative.  Patient not hypoxic.  She is in no respiratory distress.  Symptoms do not sound cardiac.  She has negative troponins.  We have replaced her wrist splint.  She is encouraged to follow-up with hand as previously instructed during last visit.  Amount and/or Complexity of Data Reviewed Labs: ordered.    Details: Initial troponin 10, repeat is 8, no leukocytosis, mildly increased creatinine at 1.49 Radiology: ordered.     Consultants: No consultations were needed in caring for this patient.   Treatment and Plan: Emergency department workup does not suggest an  emergent condition requiring admission or immediate intervention beyond  what has been performed at this time. The patient is safe for discharge and has  been instructed to return immediately for worsening symptoms, change in  symptoms or any other concerns    Final Clinical Impressions(s) / ED Diagnoses     ICD-10-CM   1. Chest wall pain  R07.89       ED Discharge Orders     None         Discharge Instructions Discussed with and Provided to Patient:   Discharge Instructions   None      Roxy Horseman, PA-C 03/09/22 0448    Tilden Fossa, MD 03/09/22 [phone]

## 2022-03-09 NOTE — ED Notes (Signed)
AVS reviewed with pt prior to discharge. Pt verbalizes understanding. Belongings with pt upon depart. Ambulatory to POV with family.  

## 2022-03-09 NOTE — ED Triage Notes (Signed)
Pt presents with numbness and "tightening" to right torso around to her back.  States she has hx of stroke in September.  Pt states residual effects from that are poor balance.  Pt reports no change in speech or other motor deficits today.  Tightening began yesterday.

## 2022-03-18 ENCOUNTER — Other Ambulatory Visit: Payer: Self-pay

## 2022-04-22 ENCOUNTER — Inpatient Hospital Stay: Payer: Medicaid Other | Admitting: Internal Medicine

## 2022-04-25 ENCOUNTER — Other Ambulatory Visit: Payer: Self-pay | Admitting: Internal Medicine

## 2022-04-25 ENCOUNTER — Other Ambulatory Visit: Payer: Self-pay

## 2022-04-25 NOTE — Telephone Encounter (Signed)
Requested medication (s) are due for refill today: yes  Requested medication (s) are on the active medication list: yes  Last refill:  atorvastatin 12/05/21, isosorbide hydralazine carvedilol 12/28/21, losartan levothyroxine 02/11/22, amlodipine  Future visit scheduled: yes 05/24/22  Notes to clinic:  pt is due of updated labs and OV, has appt scheduled for 05/24/22, some meds are from hospital provider. Please advise      Requested Prescriptions  Pending Prescriptions Disp Refills   atorvastatin (LIPITOR) 40 MG tablet 30 tablet 2    Sig: Take 1 tablet (40 mg total) by mouth daily.     Cardiovascular:  Antilipid - Statins Failed - 04/25/2022  8:23 AM      Failed - Valid encounter within last 12 months    Recent Outpatient Visits           1 year ago Essential hypertension   [facility] Jonah Blue B, MD   1 year ago Essential hypertension   [place] [facility] Marcine Matar, MD   2 years ago Essential hypertension   [place] [facility] [place], [place] J, NP   2 years ago Gastroesophageal reflux disease without esophagitis   [place] [facility] Marcine Matar, MD   3 years ago Essential hypertension   [place] [facility] Marcine Matar, MD       Future Appointments             In 4 weeks Marcine Matar, MD [place] [facility]            Failed - Lipid Panel in normal range within the last 12 months    Cholesterol, Total  Date Value Ref Range Status  03/19/2019 165 100 - 199 mg/dL Final   Cholesterol  Date Value Ref Range Status  12/04/2021 153 0 - 200 mg/dL Final   LDL Chol Calc (NIH)  Date Value Ref Range Status  03/19/2019 78 0 - 99 mg/dL Final   LDL Cholesterol  Date Value Ref Range Status  12/04/2021 80 0 - 99 mg/dL Final    Comment:           Total Cholesterol/HDL:CHD  Risk Coronary Heart Disease Risk Table                     Men   Women  1/2 Average Risk   3.4   3.3  Average Risk       5.0   4.4  2 X Average Risk   9.6   7.1  3 X Average Risk  23.4   11.0        Use the calculated Patient Ratio above and the CHD Risk Table to determine the patient's CHD Risk.        ATP III CLASSIFICATION (LDL):  <100     mg/dL   Optimal  366-440  mg/dL   Near or Above                    Optimal  130-159  mg/dL   Borderline  347-425  mg/dL   High  >956     mg/dL   Very High Performed at [facility], [address] [place], [place] [address]    HDL  Date Value Ref Range Status  12/04/2021 61 >40 mg/dL Final  43/32/9518 75 >84  mg/dL Final   Triglycerides  Date Value Ref Range Status  12/04/2021 62 <150 mg/dL Final         Passed - Patient is not pregnant       isosorbide mononitrate (IMDUR) 30 MG 24 hr tablet 30 tablet 0    Sig: Take 1 tablet (30 mg total) by mouth daily.     Cardiovascular:  Nitrates Failed - 04/25/2022  8:23 AM      Failed - Last BP in normal range    BP Readings from Last 1 Encounters:  03/09/22 (!) 157/86         Failed - Valid encounter within last 12 months    Recent Outpatient Visits           1 year ago Essential hypertension   [place] [facility] Marcine Matar, MD   1 year ago Essential hypertension   [place] Marcine Matar, MD   2 years ago Essential hypertension   [place] [place], [place] J, NP   2 years ago Gastroesophageal reflux disease without esophagitis   [place] [facility] Marcine Matar, MD   3 years ago Essential hypertension   [place] [facility] Marcine Matar, MD       Future Appointments             In 4 weeks Marcine Matar, MD [facility]            Passed - Last Heart  Rate in normal range    Pulse Readings from Last 1 Encounters:  03/09/22 (!) 59          hydrALAZINE (APRESOLINE) 100 MG tablet 90 tablet 0    Sig: Take 1 tablet (100 mg total) by mouth every 8 (eight) hours.     Cardiovascular:  Vasodilators Failed - 04/25/2022  8:23 AM      Failed - HGB in normal range and within 360 days    Hemoglobin  Date Value Ref Range Status  03/09/2022 11.4 (L) 12.0 - 15.0 g/dL Final  63/87/5643 32.9 11.1 - 15.9 g/dL Final         Failed - ANA Screen, Ifa, Serum in normal range and within 360 days    No results found for: "ANA", "ANATITER", "LABANTI"       Failed - Last BP in normal range    BP Readings from Last 1 Encounters:  03/09/22 (!) 157/86         Failed - Valid encounter within last 12 months    Recent Outpatient Visits           1 year ago Essential hypertension   [place] [facility] Marcine Matar, MD   1 year ago Essential hypertension   [place] [facility] Marcine Matar, MD   2 years ago Essential hypertension   [place] [facility] [place], [place] J, NP   2 years ago Gastroesophageal reflux disease without esophagitis   [place] Marcine Matar, MD   3 years ago Essential hypertension    [place] Marcine Matar, MD       Future Appointments             In 4 weeks  Ladell Pier, MD [place]            Passed - HCT in normal range and within 360 days    HCT  Date Value Ref Range Status  03/09/2022 37.7 36.0 - 46.0 % Final   Hematocrit  Date Value Ref Range Status  11/11/2017 41.9 34.0 - 46.6 % Final         Passed - RBC in normal range and within 360 days    RBC  Date Value Ref Range Status  03/09/2022 4.95 3.87 - 5.11 MIL/uL Final         Passed - WBC in normal range and within 360 days    WBC  Date  Value Ref Range Status  03/09/2022 4.5 4.0 - 10.5 K/uL Final         Passed - PLT in normal range and within 360 days    Platelets  Date Value Ref Range Status  03/09/2022 224 150 - 400 K/uL Final  11/11/2017 250 150 - 450 x10E3/uL Final          carvedilol (COREG) 6.25 MG tablet 60 tablet 0    Sig: Take 1 tablet (6.25 mg total) by mouth 2 (two) times daily.     Cardiovascular: Beta Blockers 3 Failed - 04/25/2022  8:23 AM      Failed - Cr in normal range and within 360 days    Creat  Date Value Ref Range Status  05/07/2016 1.11 (H) 0.50 - 1.05 mg/dL Final    Comment:      For patients > or = 59 years of age: The upper reference limit for Creatinine is approximately 13% higher for people identified as African-American.      Creatinine, Ser  Date Value Ref Range Status  03/09/2022 1.49 (H) 0.44 - 1.00 mg/dL Final   Creatinine,U  Date Value Ref Range Status  07/27/2007   Final   147.1 (NOTE)  Cutoff Values for Urine Drug Screen:        Drug Class           Cutoff (ng/mL)        Amphetamines            1000        Barbiturates             200        Cocaine Metabolites      300        Benzodiazepines          200        Methadone                 300        Opiates                 2000        Phencyclidine             25        Propoxyphene             300        Marijuana Metabolites     50  For medical purposes only.         Failed - Last BP in normal range    BP Readings from Last 1 Encounters:  03/09/22 (!) 157/86         Failed - Valid encounter within last 6 months    Recent Outpatient  Visits           1 year ago Essential hypertension   [facility] Marcine MatarJohnson, Deborah B, MD   1 year ago Essential hypertension   [place] [facility] Marcine MatarJohnson, Deborah B, MD   2 years ago Essential hypertension   [place] [facility] [place], [place] J, NP   2 years ago Gastroesophageal reflux disease  without esophagitis   [place] [facility] Jonah BlueJohnson, Deborah B, MD   3 years ago Essential hypertension   [facility] Marcine MatarJohnson, Deborah B, MD       Future Appointments             In 4 weeks Marcine MatarJohnson, Deborah B, MD [facility]            Passed - AST in normal range and within 360 days    AST  Date Value Ref Range Status  12/24/2021 21 15 - 41 U/L Final         Passed - ALT in normal range and within 360 days    ALT  Date Value Ref Range Status  12/24/2021 15 0 - 44 U/L Final         Passed - Last Heart Rate in normal range    Pulse Readings from Last 1 Encounters:  03/09/22 (!) 59          losartan (COZAAR) 50 MG tablet 30 tablet 0    Sig: Take 1 tablet (50 mg total) by mouth daily.     Cardiovascular:  Angiotensin Receptor Blockers Failed - 04/25/2022  8:23 AM      Failed - Cr in normal range and within 180 days    Creat  Date Value Ref Range Status  05/07/2016 1.11 (H) 0.50 - 1.05 mg/dL Final    Comment:      For patients > or = 59 years of age: The upper reference limit for Creatinine is approximately 13% higher for people identified as African-American.      Creatinine, Ser  Date Value Ref Range Status  03/09/2022 1.49 (H) 0.44 - 1.00 mg/dL Final   Creatinine,U  Date Value Ref Range Status  07/27/2007   Final   147.1 (NOTE)  Cutoff Values for Urine Drug Screen:        Drug Class           Cutoff (ng/mL)        Amphetamines            1000        Barbiturates             200        Cocaine Metabolites      300        Benzodiazepines          200        Methadone                 300        Opiates                 2000        Phencyclidine             25        Propoxyphene             300  Marijuana Metabolites     50  For medical purposes only.         Failed - Last BP in normal range    BP Readings from Last 1 Encounters:  03/09/22 (!) 157/86          Failed - Valid encounter within last 6 months    Recent Outpatient Visits           1 year ago Essential hypertension   [place], MD   1 year ago Essential hypertension   [place], MD   2 years ago Essential hypertension   [place], [place] J, NP   2 years ago Gastroesophageal reflux disease without esophagitis   [place], MD   3 years ago Essential hypertension   [place], MD       Future Appointments             In 4 weeks Ladell Pier, MD [place] in normal range and within 180 days    Potassium  Date Value Ref Range Status  03/09/2022 3.5 3.5 - 5.1 mmol/L Final         Passed - Patient is not pregnant       levothyroxine (SYNTHROID) 200 MCG tablet 30 tablet 0    Sig: Take 1 tablet (200 mcg total) by mouth daily before breakfast.     Endocrinology:  Hypothyroid Agents Failed - 04/25/2022  8:23 AM      Failed - TSH in normal range and within 360 days    TSH  Date Value Ref Range Status  12/25/2021 16.524 (H) 0.350 - 4.500 uIU/mL Final    Comment:    Performed by a 3rd Generation assay with a functional sensitivity of <=0.01 uIU/mL. Performed at [facility], [place] [address] [place], [place] [address]   08/30/2020 2.030 0.450 - 4.500 uIU/mL Final         Failed - Valid encounter within last 12 months    Recent Outpatient Visits           1 year ago Essential hypertension   [place], MD   1 year ago Essential hypertension   [place], MD   2 years ago Essential hypertension   [place], [place] J, NP   2 years ago Gastroesophageal reflux disease without esophagitis   [place], MD   3 years ago Essential hypertension   [place], MD       Future Appointments             In 4 weeks Ladell Pier, MD [place]             amLODipine (NORVASC) 10 MG tablet 10 tablet 0    Sig: Take 1 tablet (10 mg total) by mouth daily.     Cardiovascular: Calcium Channel Blockers 2 Failed - 04/25/2022  8:23 AM  Failed - Last BP in normal range    BP Readings from Last 1 Encounters:  03/09/22 (!) 157/86         Failed - Valid encounter within last 6 months    Recent Outpatient Visits           1 year ago Essential hypertension   [place] [facility] Marcine Matar, MD   1 year ago Essential hypertension   [place] [facility] Marcine Matar, MD   2 years ago Essential hypertension   [place] [facility] [place], [place] J, NP   2 years ago Gastroesophageal reflux disease without esophagitis   [facility] Marcine Matar, MD   3 years ago Essential hypertension   [place] [facility]ine Matar, MD       Future Appointments             In 4 weeks Marcine Matar, MD [facility]            Passed - Last Heart Rate in normal range    Pulse Readings from Last 1 Encounters:  03/09/22 (!) 59

## 2022-04-26 ENCOUNTER — Encounter (HOSPITAL_COMMUNITY): Payer: Self-pay

## 2022-04-26 ENCOUNTER — Other Ambulatory Visit (HOSPITAL_COMMUNITY): Payer: Self-pay

## 2022-04-26 NOTE — Telephone Encounter (Signed)
Patient has not been seen in >1 year and several of these requests require lab monitoring. Will have to defer to pt's PCP for refills.

## 2022-04-27 MED ORDER — LOSARTAN POTASSIUM 50 MG PO TABS
50.0000 mg | ORAL_TABLET | Freq: Every day | ORAL | 0 refills | Status: DC
  Filled 2022-04-27 – 2022-05-03 (×2): qty 30, 30d supply, fill #0

## 2022-04-27 MED ORDER — ATORVASTATIN CALCIUM 40 MG PO TABS
40.0000 mg | ORAL_TABLET | Freq: Every day | ORAL | 0 refills | Status: DC
  Filled 2022-04-27 – 2022-05-03 (×2): qty 30, 30d supply, fill #0

## 2022-04-27 MED ORDER — ISOSORBIDE MONONITRATE ER 30 MG PO TB24
30.0000 mg | ORAL_TABLET | Freq: Every day | ORAL | 0 refills | Status: DC
  Filled 2022-04-27 – 2022-05-03 (×2): qty 30, 30d supply, fill #0

## 2022-04-27 MED ORDER — CARVEDILOL 6.25 MG PO TABS
6.2500 mg | ORAL_TABLET | Freq: Two times a day (BID) | ORAL | 0 refills | Status: DC
  Filled 2022-04-27 – 2022-05-03 (×2): qty 60, 30d supply, fill #0

## 2022-04-27 MED ORDER — AMLODIPINE BESYLATE 10 MG PO TABS
10.0000 mg | ORAL_TABLET | Freq: Every day | ORAL | 0 refills | Status: DC
  Filled 2022-04-27 – 2022-05-03 (×2): qty 30, 30d supply, fill #0

## 2022-04-27 MED ORDER — LEVOTHYROXINE SODIUM 200 MCG PO TABS
200.0000 ug | ORAL_TABLET | Freq: Every day | ORAL | 0 refills | Status: DC
  Filled 2022-04-27 – 2022-05-03 (×2): qty 30, 30d supply, fill #0

## 2022-04-27 MED ORDER — HYDRALAZINE HCL 100 MG PO TABS
100.0000 mg | ORAL_TABLET | Freq: Three times a day (TID) | ORAL | 0 refills | Status: DC
  Filled 2022-04-27 – 2022-05-03 (×2): qty 90, 30d supply, fill #0

## 2022-04-29 ENCOUNTER — Other Ambulatory Visit (HOSPITAL_COMMUNITY): Payer: Self-pay

## 2022-04-29 ENCOUNTER — Encounter (HOSPITAL_COMMUNITY): Payer: Self-pay

## 2022-04-29 ENCOUNTER — Other Ambulatory Visit: Payer: Self-pay

## 2022-05-01 ENCOUNTER — Other Ambulatory Visit: Payer: Self-pay

## 2022-05-03 ENCOUNTER — Other Ambulatory Visit: Payer: Self-pay

## 2022-05-24 ENCOUNTER — Emergency Department (HOSPITAL_COMMUNITY)
Admission: EM | Admit: 2022-05-24 | Discharge: 2022-05-24 | Disposition: A | Discharge status: Home / Self Care | Payer: Medicaid Other | Attending: Emergency Medicine | Admitting: Emergency Medicine

## 2022-05-24 ENCOUNTER — Inpatient Hospital Stay: Payer: Medicaid Other | Admitting: Internal Medicine

## 2022-05-24 DIAGNOSIS — I10 Essential (primary) hypertension: Secondary | ICD-10-CM | POA: Insufficient documentation

## 2022-05-24 DIAGNOSIS — R197 Diarrhea, unspecified: Secondary | ICD-10-CM | POA: Diagnosis not present

## 2022-05-24 DIAGNOSIS — Z79899 Other long term (current) drug therapy: Secondary | ICD-10-CM | POA: Insufficient documentation

## 2022-05-24 DIAGNOSIS — Z7982 Long term (current) use of aspirin: Secondary | ICD-10-CM | POA: Diagnosis not present

## 2022-05-24 DIAGNOSIS — R7401 Elevation of levels of liver transaminase levels: Secondary | ICD-10-CM | POA: Insufficient documentation

## 2022-05-24 DIAGNOSIS — R110 Nausea: Secondary | ICD-10-CM | POA: Diagnosis not present

## 2022-05-24 LAB — COMPREHENSIVE METABOLIC PANEL
ALT: 100 U/L — ABNORMAL HIGH (ref 0–44)
AST: 96 U/L — ABNORMAL HIGH (ref 15–41)
Albumin: 3.5 g/dL (ref 3.5–5.0)
Alkaline Phosphatase: 81 U/L (ref 38–126)
Anion gap: 10 (ref 5–15)
BUN: 19 mg/dL (ref 6–20)
CO2: 26 mmol/L (ref 22–32)
Calcium: 9.2 mg/dL (ref 8.9–10.3)
Chloride: 103 mmol/L (ref 98–111)
Creatinine, Ser: 1.28 mg/dL — ABNORMAL HIGH (ref 0.44–1.00)
GFR, Estimated: 49 mL/min — ABNORMAL LOW (ref >60)
Glucose, Bld: 84 mg/dL (ref 70–99)
Potassium: 3.3 mmol/L — ABNORMAL LOW (ref 3.5–5.1)
Sodium: 139 mmol/L (ref 135–145)
Total Bilirubin: 0.5 mg/dL (ref 0.3–1.2)
Total Protein: 8.4 g/dL — ABNORMAL HIGH (ref 6.5–8.1)

## 2022-05-24 LAB — CBC
HCT: 37.3 % (ref 36.0–46.0)
Hemoglobin: 11.1 g/dL — ABNORMAL LOW (ref 12.0–15.0)
MCH: 24.2 pg — ABNORMAL LOW (ref 26.0–34.0)
MCHC: 29.8 g/dL — ABNORMAL LOW (ref 30.0–36.0)
MCV: 81.3 fL (ref 80.0–100.0)
Platelets: 230 10*3/uL (ref 150–400)
RBC: 4.59 MIL/uL (ref 3.87–5.11)
RDW: 18 % — ABNORMAL HIGH (ref 11.5–15.5)
WBC: 5.3 10*3/uL (ref 4.0–10.5)
nRBC: 0 % (ref 0.0–0.2)

## 2022-05-24 MED ORDER — ONDANSETRON HCL 4 MG/2ML IJ SOLN
4.0000 mg | Freq: Once | INTRAMUSCULAR | Status: DC
  Filled 2022-05-24: qty 2

## 2022-05-24 MED ORDER — ONDANSETRON 4 MG PO TBDP
4.0000 mg | ORAL_TABLET | Freq: Once | ORAL | Status: AC
  Administered 2022-05-24: 4 mg via ORAL
  Filled 2022-05-24: qty 1

## 2022-05-24 MED ORDER — SODIUM CHLORIDE 0.9 % IV BOLUS: 1000.0000 mL | Freq: Once | INTRAVENOUS | Status: DC

## 2022-05-24 NOTE — ED Notes (Signed)
Attempted IV start, patient kicked and screamed on attempt and states she does not want to be stuck again or else she will go home. MD notified

## 2022-05-24 NOTE — ED Triage Notes (Signed)
Pt to ED c/o nausea and vomiting x 1 day No relief with OTC medications, reports feels better today than yesterday

## 2022-05-24 NOTE — ED Provider Notes (Signed)
[facility] Provider Note   CSN: [id number] Arrival date & time: 05/24/22  1050     History  Chief Complaint  Patient presents with   Nausea    Ann Jarvis is a 57 y.o. female.  57 y.o female with a PMH of HTN, Cocaine abuse, Graves presents to the ED with a chief complaint of diarrhea which began yesterday.  Patient reports her symptoms began yesterday throughout the day had multiple episodes of diarrhea, no improvement in her symptoms.  She also endorses some nausea this morning however did have a donut for breakfast and now feels currently hungry.  She denies any abdominal pain.  She has not taken any medications for improvement in symptoms.  There is no alleviating or exacerbating factors.  She denies any fever, sick contacts, chest pain, shortness of breath, melena.  The history is provided by the patient and medical records.       Home Medications Prior to Admission medications   Medication Sig Start Date End Date Taking? Authorizing Provider  acetaminophen (TYLENOL) 500 MG tablet Take 1,000 mg by mouth 3 (three) times daily.    [provider]  amLODipine (NORVASC) 10 MG tablet Take 1 tablet (10 mg total) by mouth daily. 04/27/22   Ladell Pier, MD  aspirin EC 81 MG tablet Take 1 tablet (81 mg total) by mouth daily. Swallow whole. 12/06/21   Pokhrel, Corrie Mckusick, MD  atorvastatin (LIPITOR) 40 MG tablet Take 1 tablet (40 mg total) by mouth daily. 04/27/22   Ladell Pier, MD  carvedilol (COREG) 6.25 MG tablet Take 1 tablet (6.25 mg total) by mouth 2 (two) times daily. 04/27/22 06/02/22  Ladell Pier, MD  hydrALAZINE (APRESOLINE) 100 MG tablet Take 1 tablet (100 mg total) by mouth every 8 (eight) hours. 04/27/22   Ladell Pier, MD  isosorbide mononitrate (IMDUR) 30 MG 24 hr tablet Take 1 tablet (30 mg total) by mouth daily. 04/27/22   Ladell Pier, MD  levothyroxine (SYNTHROID) 200 MCG tablet Take 1  tablet (200 mcg total) by mouth daily before breakfast. 04/27/22   Ladell Pier, MD  losartan (COZAAR) 50 MG tablet Take 1 tablet (50 mg total) by mouth daily. 04/27/22   Ladell Pier, MD  oxyCODONE-acetaminophen (PERCOCET/ROXICET) 5-325 MG tablet Take 1 tablet by mouth every 6 (six) hours as needed for severe pain. 02/21/22   Antonietta Breach, PA-C  pantoprazole (PROTONIX) 40 MG tablet Take 1 tablet (40 mg total) by mouth 2 (two) times daily before a meal. 12/28/21   Thurnell Lose, MD  thiamine (VITAMIN B-1) 100 MG tablet Take 1 tablet (100 mg total) by mouth daily. Patient not taking: Reported on 12/24/2021 12/06/21   Flora Lipps, MD      Allergies    Ciprofloxacin, Flagyl [metronidazole], and Penicillins    Review of Systems   Review of Systems  Constitutional:  Negative for chills and fever.  HENT:  Negative for sore throat.   Respiratory:  Negative for shortness of breath.   Cardiovascular:  Negative for chest pain.  Gastrointestinal:  Positive for diarrhea and nausea. Negative for abdominal pain.  Genitourinary:  Negative for flank pain.  Musculoskeletal:  Negative for back pain.  Neurological:  Negative for light-headedness and headaches.  All other systems reviewed and are negative.   Physical Exam Updated Vital Signs BP (!) 177/99 (BP Location: Right Arm)   Pulse 65   Temp 98.4 F (36.9 C)  Resp 18   Ht '5\' 11"'$  (1.803 m)   Wt 68 kg   LMP 10/07/2015   SpO2 100%   BMI 20.92 kg/m  Physical Exam Vitals and nursing note reviewed.  Constitutional:      General: She is not in acute distress.    Appearance: She is well-developed.  HENT:     Head: Normocephalic and atraumatic.     Mouth/Throat:     Pharynx: No oropharyngeal exudate.  Eyes:     Pupils: Pupils are equal, round, and reactive to light.  Cardiovascular:     Rate and Rhythm: Regular rhythm.     Heart sounds: Normal heart sounds.  Pulmonary:     Effort: Pulmonary effort is normal. No respiratory  distress.     Breath sounds: Normal breath sounds.  Abdominal:     General: Bowel sounds are normal. There is no distension.     Palpations: Abdomen is soft.     Tenderness: There is no abdominal tenderness.  Musculoskeletal:        General: No tenderness or deformity.     Cervical back: Normal range of motion.     Right lower leg: No edema.     Left lower leg: No edema.  Skin:    General: Skin is warm and dry.  Neurological:     Mental Status: She is alert and oriented to person, place, and time.     ED Results / Procedures / Treatments   Labs (all labs ordered are listed, but only abnormal results are displayed) Labs Reviewed  COMPREHENSIVE METABOLIC PANEL - Abnormal; Notable for the following components:      Result Value   Potassium 3.3 (*)    Creatinine, Ser 1.28 (*)    Total Protein 8.4 (*)    AST 96 (*)    ALT 100 (*)    GFR, Estimated 49 (*)    All other components within normal limits  CBC - Abnormal; Notable for the following components:   Hemoglobin 11.1 (*)    MCH 24.2 (*)    MCHC 29.8 (*)    RDW 18.0 (*)    All other components within normal limits  RESP PANEL BY RT-PCR (RSV, FLU A&B, COVID)  [id number]  URINALYSIS, ROUTINE W REFLEX MICROSCOPIC    EKG None  Radiology No results found.  Procedures Procedures    Medications Ordered in ED Medications  ondansetron (ZOFRAN-ODT) disintegrating tablet 4 mg (4 mg Oral Given 05/24/22 1521)    ED Course/ Medical Decision Making/ A&P                             Medical Decision Making Amount and/or Complexity of Data Reviewed Labs: ordered.  Risk Prescription drug management.   This patient presents to the ED for concern of diarrhea, this involves a number of treatment options, and is a complaint that carries with it a high risk of complications and morbidity.  The differential diagnosis includes melena, gastroenteritis.    Co morbidities: Discussed in HPI   Brief History:  Patient with  underlying history of high blood pressure here with a chief complaint of diarrhea for the past 24 hours, reporting multiple episodes of this unable to go to work.  Has not taken anything for improvement in symptoms.  No alleviating or exacerbating factors.  No blood in her stool that she noted.  EMR reviewed including pt PMHx, past surgical history and past visits to  ER.   See HPI for more details   Lab Tests:  I ordered and independently interpreted labs.  The pertinent results include:    I personally reviewed all laboratory work and imaging. Metabolic panel without any acute abnormality specifically kidney function within normal limits and no significant electrolyte abnormalities. CBC without leukocytosis or significant anemia.  Slight elevation on her LFTs however no guarding on my exam, no Murphy sign. Denies any abdominal pain.   Imaging Studies:  No imaging studies ordered for this patient   Medicines ordered:  I ordered medication including zofran,bolus  for symptomatic treatment Reevaluation of the patient after these medicines showed that the patient  I have reviewed the patients home medicines and have made adjustments as needed  Reevaluation:  After the interventions noted above I re-evaluated patient and found that they have :   Social Determinants of Health:  The patient's social determinants of health were a factor in the care of this patient  Problem List / ED Course:  Patient presents to the ED with a chief complaint of diarrhea that has been ongoing for the past 24 hours.  Did not take any medication for improvement in her symptoms.  On evaluation patient is overall well-appearing.  I attempted to provide patient with some fluids along with antiemetics via IV however patient declined at this time.  I evaluated her blood work which did not show any signs of acute infection.  Her CMP is without any electrolyte abnormality.  Creatinine level slightly elevated  however she refused fluids at this time.  Respiratory panel was pending along with UA however she did not voice any urinary symptoms at this time. She is requesting disposition home, has been able to drink while in the ED without any further episode of vomiting. She is hemodynamically stable for discharge.   Dispostion:  After consideration of the diagnostic results and the patients response to treatment, I feel that the patent would benefit from follow up with PCP.    Portions of this note were generated with Lobbyist. Dictation errors may occur despite best attempts at proofreading.  Final Clinical Impression(s) / ED Diagnoses Final diagnoses:  Nausea without vomiting  Diarrhea, unspecified type    Rx / DC Orders ED Discharge Orders     None         Janeece Fitting, PA-C 05/24/22 1542    Hayden Rasmussen, MD 05/25/22 1028

## 2022-05-24 NOTE — Discharge Instructions (Signed)
Your laboratories also are within normal limits today.  You may follow-up with your primary care physician regardless convenience.

## 2022-05-24 NOTE — ED Notes (Signed)
Pt did not want to wait for discharge vital signs. Pt asking for her d/c paperwork. Pt verbalized understanding of d/c instructions and follow up care.Pt A&OX4 ambulatory with independent steady gait.

## 2022-06-10 ENCOUNTER — Other Ambulatory Visit: Payer: Self-pay | Admitting: Internal Medicine

## 2022-06-10 ENCOUNTER — Other Ambulatory Visit: Payer: Self-pay

## 2022-06-10 MED ORDER — ATORVASTATIN CALCIUM 40 MG PO TABS
40.0000 mg | ORAL_TABLET | Freq: Every day | ORAL | 0 refills | Status: DC
  Filled 2022-06-10: qty 30, 30d supply, fill #0

## 2022-06-10 MED ORDER — CARVEDILOL 6.25 MG PO TABS
6.2500 mg | ORAL_TABLET | Freq: Two times a day (BID) | ORAL | 0 refills | Status: DC
  Filled 2022-06-10: qty 60, 30d supply, fill #0

## 2022-06-10 MED ORDER — ISOSORBIDE MONONITRATE ER 30 MG PO TB24
30.0000 mg | ORAL_TABLET | Freq: Every day | ORAL | 0 refills | Status: DC
  Filled 2022-06-10: qty 30, 30d supply, fill #0

## 2022-06-10 MED ORDER — AMLODIPINE BESYLATE 10 MG PO TABS
10.0000 mg | ORAL_TABLET | Freq: Every day | ORAL | 0 refills | Status: DC
  Filled 2022-06-10: qty 30, 30d supply, fill #0

## 2022-06-10 MED ORDER — HYDRALAZINE HCL 100 MG PO TABS
100.0000 mg | ORAL_TABLET | Freq: Three times a day (TID) | ORAL | 0 refills | Status: DC
  Filled 2022-06-10: qty 90, 30d supply, fill #0

## 2022-06-10 MED ORDER — LOSARTAN POTASSIUM 50 MG PO TABS
50.0000 mg | ORAL_TABLET | Freq: Every day | ORAL | 0 refills | Status: DC
  Filled 2022-06-10: qty 30, 30d supply, fill #0

## 2022-06-10 MED ORDER — LEVOTHYROXINE SODIUM 200 MCG PO TABS
200.0000 ug | ORAL_TABLET | Freq: Every day | ORAL | 0 refills | Status: DC
  Filled 2022-06-10: qty 30, 30d supply, fill #0

## 2022-06-11 ENCOUNTER — Other Ambulatory Visit: Payer: Self-pay

## 2022-06-12 ENCOUNTER — Other Ambulatory Visit: Payer: Self-pay

## 2022-06-13 ENCOUNTER — Other Ambulatory Visit: Payer: Self-pay

## 2022-06-13 ENCOUNTER — Encounter (HOSPITAL_COMMUNITY): Payer: Self-pay

## 2022-06-13 ENCOUNTER — Other Ambulatory Visit (HOSPITAL_COMMUNITY): Payer: Self-pay

## 2022-06-17 ENCOUNTER — Other Ambulatory Visit: Payer: Self-pay

## 2022-06-18 ENCOUNTER — Other Ambulatory Visit: Payer: Self-pay

## 2022-06-18 ENCOUNTER — Encounter: Payer: Self-pay | Admitting: Internal Medicine

## 2022-06-18 ENCOUNTER — Ambulatory Visit: Payer: Medicaid Other | Attending: Internal Medicine | Admitting: Internal Medicine

## 2022-06-18 VITALS — BP 212/69 | HR 52 | Temp 98.2°F | Ht 71.0 in | Wt 137.0 lb

## 2022-06-18 DIAGNOSIS — I10 Essential (primary) hypertension: Secondary | ICD-10-CM

## 2022-06-18 DIAGNOSIS — I6930 Unspecified sequelae of cerebral infarction: Secondary | ICD-10-CM

## 2022-06-18 DIAGNOSIS — F1910 Other psychoactive substance abuse, uncomplicated: Secondary | ICD-10-CM | POA: Diagnosis not present

## 2022-06-18 DIAGNOSIS — F17210 Nicotine dependence, cigarettes, uncomplicated: Secondary | ICD-10-CM

## 2022-06-18 DIAGNOSIS — E890 Postprocedural hypothyroidism: Secondary | ICD-10-CM

## 2022-06-18 DIAGNOSIS — H5203 Hypermetropia, bilateral: Secondary | ICD-10-CM

## 2022-06-18 DIAGNOSIS — H9193 Unspecified hearing loss, bilateral: Secondary | ICD-10-CM

## 2022-06-18 DIAGNOSIS — F17200 Nicotine dependence, unspecified, uncomplicated: Secondary | ICD-10-CM

## 2022-06-18 DIAGNOSIS — S62101S Fracture of unspecified carpal bone, right wrist, sequela: Secondary | ICD-10-CM

## 2022-06-18 DIAGNOSIS — J302 Other seasonal allergic rhinitis: Secondary | ICD-10-CM

## 2022-06-18 DIAGNOSIS — Z2821 Immunization not carried out because of patient refusal: Secondary | ICD-10-CM

## 2022-06-18 DIAGNOSIS — D649 Anemia, unspecified: Secondary | ICD-10-CM

## 2022-06-18 DIAGNOSIS — Z1231 Encounter for screening mammogram for malignant neoplasm of breast: Secondary | ICD-10-CM

## 2022-06-18 DIAGNOSIS — N1831 Chronic kidney disease, stage 3a: Secondary | ICD-10-CM

## 2022-06-18 DIAGNOSIS — Z8719 Personal history of other diseases of the digestive system: Secondary | ICD-10-CM

## 2022-06-18 MED ORDER — LEVOTHYROXINE SODIUM 200 MCG PO TABS
200.0000 ug | ORAL_TABLET | Freq: Every day | ORAL | 1 refills | Status: DC
  Filled 2022-06-18: qty 90, 90d supply, fill #0
  Filled 2022-09-13: qty 90, 90d supply, fill #1
  Filled 2022-10-10 (×2): qty 90, 90d supply, fill #0
  Filled 2022-10-10: qty 90, 90d supply, fill #1
  Filled 2022-10-10 (×2): qty 90, 90d supply, fill #0

## 2022-06-18 MED ORDER — ATORVASTATIN CALCIUM 40 MG PO TABS
40.0000 mg | ORAL_TABLET | Freq: Every day | ORAL | 1 refills | Status: DC
  Filled 2022-06-18: qty 90, 90d supply, fill #0
  Filled 2022-09-13: qty 90, 90d supply, fill #1
  Filled 2022-10-10: qty 90, 90d supply, fill #0
  Filled 2022-10-10: qty 90, 90d supply, fill #1
  Filled 2022-10-10 (×3): qty 90, 90d supply, fill #0

## 2022-06-18 MED ORDER — AMLODIPINE BESYLATE 10 MG PO TABS
10.0000 mg | ORAL_TABLET | Freq: Every day | ORAL | 1 refills | Status: DC
  Filled 2022-06-18: qty 90, 90d supply, fill #0
  Filled 2022-09-13: qty 90, 90d supply, fill #1
  Filled 2022-10-10: qty 90, 90d supply, fill #0
  Filled 2022-10-10: qty 90, 90d supply, fill #1
  Filled 2022-10-10: qty 90, 90d supply, fill #0

## 2022-06-18 MED ORDER — LOSARTAN POTASSIUM 50 MG PO TABS
50.0000 mg | ORAL_TABLET | Freq: Every day | ORAL | 1 refills | Status: DC
  Filled 2022-06-18: qty 90, 90d supply, fill #0
  Filled 2022-09-13: qty 90, 90d supply, fill #1
  Filled 2022-10-10: qty 90, 90d supply, fill #0
  Filled 2022-10-10: qty 90, 90d supply, fill #1
  Filled 2022-10-10 (×3): qty 90, 90d supply, fill #0

## 2022-06-18 MED ORDER — LORATADINE 10 MG PO TABS
10.0000 mg | ORAL_TABLET | Freq: Every day | ORAL | 11 refills | Status: DC
  Filled 2022-06-18: qty 30, 30d supply, fill #0
  Filled 2022-09-13 – 2022-10-10 (×2): qty 30, 30d supply, fill #1
  Filled 2022-10-10 – 2023-01-10 (×5): qty 30, 30d supply, fill #0

## 2022-06-18 MED ORDER — ASPIRIN 81 MG PO TBEC
81.0000 mg | DELAYED_RELEASE_TABLET | Freq: Every day | ORAL | 1 refills | Status: DC
  Filled 2022-06-18: qty 100, 100d supply, fill #0
  Filled 2022-10-10 (×3): qty 120, 120d supply, fill #0

## 2022-06-18 MED ORDER — ISOSORBIDE MONONITRATE ER 30 MG PO TB24
30.0000 mg | ORAL_TABLET | Freq: Every day | ORAL | 1 refills | Status: DC
  Filled 2022-06-18: qty 90, 90d supply, fill #0
  Filled 2022-09-13: qty 90, 90d supply, fill #1
  Filled 2022-10-10 (×4): qty 90, 90d supply, fill #0
  Filled 2022-10-10: qty 90, 90d supply, fill #1

## 2022-06-18 MED ORDER — HYDRALAZINE HCL 100 MG PO TABS
100.0000 mg | ORAL_TABLET | Freq: Three times a day (TID) | ORAL | 1 refills | Status: DC
  Filled 2022-06-18: qty 270, 90d supply, fill #0
  Filled 2022-09-13 – 2022-10-10 (×2): qty 270, 90d supply, fill #1
  Filled 2022-10-10 (×3): qty 270, 90d supply, fill #0

## 2022-06-18 MED ORDER — CARVEDILOL 6.25 MG PO TABS
6.2500 mg | ORAL_TABLET | Freq: Two times a day (BID) | ORAL | 1 refills | Status: DC
  Filled 2022-06-18: qty 180, 90d supply, fill #0
  Filled 2022-09-13: qty 180, 90d supply, fill #1
  Filled 2022-10-10 (×3): qty 180, 90d supply, fill #0
  Filled 2022-10-10: qty 180, 90d supply, fill #1
  Filled 2022-10-10: qty 180, 90d supply, fill #0

## 2022-06-18 MED ORDER — PANTOPRAZOLE SODIUM 40 MG PO TBEC
40.0000 mg | DELAYED_RELEASE_TABLET | Freq: Every day | ORAL | 1 refills | Status: DC
  Filled 2022-06-18 – 2023-01-10 (×5): qty 90, 90d supply, fill #0

## 2022-06-18 NOTE — Progress Notes (Signed)
Patient ID: Ann Jarvis, female    DOB: 07/16/1965  MRN: [id number]  CC: Hypertension (HTN f/u. Med refills. /Arm tightness, Pain on R side due to stroke - requesting medication to help with pain. Dudley Major & hearing was worsened. /No to flu vax. No to shingles. Yes to pap for another visit. Yes to mammogram referral. )   Subjective: Ann Jarvis is a 58 y.o. 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  Had fractured the wrist 01/2022 when she tripped on her porch and used her right hand to break the fall.  Seen in ER.  Given a splint.  Was supposed to follow-up with orthopedics but never did because she was staying in [facility] at the time with her daughter and had limited transportation.  History of CVA: Reports residual tightness and numbness in the right arm.  Tightness also extends into the right axilla.  Should be on atorvastatin and aspirin.  She thinks she is taking them.  Did not bring medicines with her today. Continues to smoke about 2 to 3 cigarettes a day.  Not ready to quit. Admits that she still uses cocaine and marijuana but not as often.  Requests referral to ophthalmology for eye exam.  Poor reading vision.  Also requests referral to ENT to be assessed for hearing aids.  HTN: Blood pressure elevated today.  Reports being out of all of her blood pressure medicines for 2 weeks.  Refills were sent earlier this month for 1 month supply but patient states she was not called or notified by the pharmacy.  Complains of seasonal allergies.  Has had a lot of sneezing and runny nose over the past 1 to 2 weeks.  Reports compliance with taking levothyroxine for her thyroid. She  has chronic kidney disease with last GFR being 49.  Range over the past several months has been 40-60. She has had a mild normocytic anemia with her last H/H 11/37.3.  HM: Due for mammogram.  She declines shingles vaccine and flu vaccine.  Due for Pap smear.  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     amLODipine (NORVASC) 10 MG tablet Take 1 tablet (10 mg total) by mouth daily. *must keep upcoming appt* 30 tablet 0   aspirin EC 81 MG tablet Take 1 tablet (81 mg total) by mouth daily.  Swallow whole. 30 tablet 12   atorvastatin (LIPITOR) 40 MG tablet Take 1 tablet (40 mg total) by mouth daily. 30 tablet 0   carvedilol (COREG) 6.25 MG tablet Take 1 tablet (6.25 mg total) by mouth 2 (two) times daily. 60 tablet 0   hydrALAZINE (APRESOLINE) 100 MG tablet Take 1 tablet (100 mg total) by mouth every 8 (eight) hours. 90 tablet 0   isosorbide mononitrate (IMDUR) 30 MG 24 hr tablet Take 1 tablet (30 mg total) by mouth daily. 30 tablet 0   levothyroxine (SYNTHROID) 200 MCG tablet Take 1 tablet (200 mcg total) by mouth  daily before breakfast. 30 tablet 0   losartan (COZAAR) 50 MG tablet Take 1 tablet (50 mg total) by mouth daily. 30 tablet 0   oxyCODONE-acetaminophen (PERCOCET/ROXICET) 5-325 MG tablet Take 1 tablet by mouth every 6 (six) hours as needed for severe pain. 15 tablet 0   pantoprazole (PROTONIX) 40 MG tablet Take 1 tablet (40 mg total) by mouth 2 (two) times daily before a meal. 60 tablet 2   thiamine (VITAMIN B-1) 100 MG tablet Take 1 tablet (100 mg total) by mouth daily.     No current facility-administered medications on file prior to visit.    Allergies  Allergen Reactions   Ciprofloxacin Rash and Other (See Comments)    Significant rash, taking with Flagyl   Flagyl [Metronidazole] Rash and Other (See Comments)    Developed significant rash while taking with Cipro   Penicillins Swelling    Did it involve swelling of the face/tongue/throat, SOB, or low BP? No Did it involve sudden or severe rash/hives, skin peeling, or any reaction on the inside of your mouth or nose? No Did you need to seek medical attention at a hospital or doctor's office? No When did it last happen?  Pt states a long time ago     If all above answers are "NO", may proceed with cephalosporin use.     Social History   Socioeconomic History   Marital status: Single    Spouse name: Not on file   Number of children: Not on file   Years of education: Not on file   Highest education level: Not on file  Occupational History   Not on file  Tobacco Use   Smoking status: Every Day    Packs/day: .25    Types: Cigarettes   Smokeless tobacco: Never   Tobacco comments:    1 cig a day  Substance and Sexual Activity   Alcohol use: Yes    Alcohol/week: 1.0 - 2.0 standard drink of alcohol    Types: 1 - 2 Cans of beer per week    Comment: occ   Drug use: Yes    Types: Marijuana, Cocaine    Comment: marijuana   Sexual activity: Not on file  Other Topics Concern   Not on file  Social History Narrative   Smokes 1  pack/week x 20 years, smokes marijuna, no cocaine in the last year.  Works for [employer].      Financial assistance approved for 100% discount at [facility] and has [facility] card Bonna Gains Aug 01 2009 8.58am   Financial assistance approved for 100% discount at [facility] and has [facility] card Neoma Laming February 02, 2010 12.20pm   Social Determinants of Health   Financial Resource Strain: Not on file  Food Insecurity: Food Insecurity Present (12/28/2021)   Hunger Vital Sign    Worried About Running Out of Food in the Last  Year: Sometimes true    Ran Out of Food in the Last Year: Sometimes true  Transportation Needs: Unmet Transportation Needs (12/28/2021)   PRAPARE - Hydrologist (Medical): Yes    Lack of Transportation (Non-Medical): Yes  Physical Activity: Not on file  Stress: Not on file  Social Connections: Not on file  Intimate Partner Violence: Not At Risk (12/04/2021)   Humiliation, Afraid, Rape, and Kick questionnaire    Fear of Current or Ex-Partner: No    Emotionally Abused: No    Physically Abused: No    Sexually Abused: No    No family history on file.  Past Surgical History:  Procedure Laterality Date   BTL     COLONOSCOPY WITH PROPOFOL N/A 12/26/2021   Procedure: COLONOSCOPY WITH PROPOFOL;  Surgeon: Doran Stabler, MD;  Location: [facility];  Service: Gastroenterology;  Laterality: N/A;   ESOPHAGOGASTRODUODENOSCOPY (EGD) WITH PROPOFOL N/A 12/25/2021   Procedure: ESOPHAGOGASTRODUODENOSCOPY (EGD) WITH PROPOFOL;  Surgeon: Doran Stabler, MD;  Location: [facility];  Service: Gastroenterology;  Laterality: N/A;   GIVENS CAPSULE STUDY N/A 12/27/2021   Procedure: GIVENS CAPSULE STUDY;  Surgeon: Doran Stabler, MD;  Location: [facility];  Service: Gastroenterology;  Laterality: N/A;   MOUTH SURGERY      ROS: Review of Systems Negative except as stated above  PHYSICAL EXAM: BP (!) 212/69 (BP Location: Left Arm, Patient Position: Sitting,  Cuff Size: Normal)   Pulse (!) 52   Temp 98.2 F (36.8 C) (Oral)   Ht 5\' 11"  (1.803 m)   Wt 137 lb (62.1 kg)   LMP 10/07/2015   SpO2 100%   BMI 19.11 kg/m   Physical Exam  General appearance - alert, well appearing, and in no distress Mental status - normal mood, behavior, speech, dress, motor activity, and thought processes Ears -both ear canal and tympanic membrane within normal limits.  Patient is hard of hearing.  I had to remove my mask so that she can look at my lips as I spoke to her. Nose -mild enlargement of nasal turbinates. Mouth - mucous membranes moist, pharynx normal without lesions Neck - supple, no significant adenopathy Chest - clear to auscultation, no wheezes, rales or rhonchi, symmetric air entry Heart - normal rate, regular rhythm, normal S1, S2, no murmurs, rubs, clicks or gallops Extremities - peripheral pulses normal, no pedal edema, no clubbing or cyanosis Neuro: Grip 4/5 right, 5/5 left.  Power proximally and distally in the upper extremities: 4/5 right, 5/5 in left.  Gross sensation decreased in the right forearm and upper arm.  Power in the lower extremities 5/5 bilaterally.  Gait is normal.  She walks unassisted.     Latest Ref Rng & Units 05/24/2022   11:33 AM 03/09/2022    1:27 AM 12/28/2021    2:05 AM  CMP  Glucose 70 - 99 mg/dL 84  98  102   BUN 6 - 20 mg/dL 19  16  12    Creatinine 0.44 - 1.00 mg/dL 1.28  1.49  1.19   Sodium 135 - 145 mmol/L 139  138  137   Potassium 3.5 - 5.1 mmol/L 3.3  3.5  3.5   Chloride 98 - 111 mmol/L 103  99  104   CO2 22 - 32 mmol/L 26  28  25    Calcium 8.9 - 10.3 mg/dL 9.2  8.8  8.6   Total Protein 6.5 - 8.1 g/dL 8.4  Total Bilirubin 0.3 - 1.2 mg/dL 0.5     Alkaline Phos 38 - 126 U/L 81     AST 15 - 41 U/L 96     ALT 0 - 44 U/L 100      Lipid Panel     Component Value Date/Time   CHOL 153 12/04/2021 0433   CHOL 165 03/19/2019 1634   TRIG 62 12/04/2021 0433   HDL 61 12/04/2021 0433   HDL 75 03/19/2019 1634    CHOLHDL 2.5 12/04/2021 0433   VLDL 12 12/04/2021 0433   LDLCALC 80 12/04/2021 0433   LDLCALC 78 03/19/2019 1634    CBC    Component Value Date/Time   WBC 5.3 05/24/2022 1133   RBC 4.59 05/24/2022 1133   HGB 11.1 (L) 05/24/2022 1133   HGB 13.3 11/11/2017 1431   HCT 37.3 05/24/2022 1133   HCT 41.9 11/11/2017 1431   PLT 230 05/24/2022 1133   PLT 250 11/11/2017 1431   MCV 81.3 05/24/2022 1133   MCV 88 11/11/2017 1431   MCH 24.2 (L) 05/24/2022 1133   MCHC 29.8 (L) 05/24/2022 1133   RDW 18.0 (H) 05/24/2022 1133   RDW 13.3 11/11/2017 1431   LYMPHSABS 1.1 12/28/2021 0205   MONOABS 0.5 12/28/2021 0205   EOSABS 0.2 12/28/2021 0205   BASOSABS 0.0 12/28/2021 0205    ASSESSMENT AND PLAN: 1. Essential hypertension Not at goal.  She has been out of her medications and was not aware that she had refills at the pharmacy.  I have resent her medicines for 90-day supply on all of them.  Encourage compliance with medications. - amLODipine (NORVASC) 10 MG tablet; Take 1 tablet (10 mg total) by mouth daily. *must keep upcoming appt*  Dispense: 90 tablet; Refill: 1 - carvedilol (COREG) 6.25 MG tablet; Take 1 tablet (6.25 mg total) by mouth 2 (two) times daily.  Dispense: 180 tablet; Refill: 1 - hydrALAZINE (APRESOLINE) 100 MG tablet; Take 1 tablet (100 mg total) by mouth every 8 (eight) hours.  Dispense: 270 tablet; Refill: 1 - isosorbide mononitrate (IMDUR) 30 MG 24 hr tablet; Take 1 tablet (30 mg total) by mouth daily.  Dispense: 90 tablet; Refill: 1 - Basic Metabolic Panel  2. Tobacco dependence Strongly advised to quit.  She is not ready to give a trial of quitting.  3. Polysubstance abuse ([place]) Strongly advised to quit street drugs especially the cocaine.  Discussed health risks associated with ongoing cocaine use.  4. Hyperopia of both eyes - Ambulatory referral to Ophthalmology  5. Stage 3a chronic kidney disease ([place]) Advised against use of NSAIDs - Basic Metabolic Panel  6.  Seasonal allergies - loratadine (CLARITIN) 10 MG tablet; Take 1 tablet (10 mg total) by mouth daily.  Dispense: 30 tablet; Refill: 11  7. Right wrist fracture, sequela - Ambulatory referral to Orthopedics  8. History of CVA with residual deficit - aspirin EC 81 MG tablet; Take 1 tablet (81 mg total) by mouth daily. Swallow whole.  Dispense: 100 tablet; Refill: 1 - atorvastatin (LIPITOR) 40 MG tablet; Take 1 tablet (40 mg total) by mouth daily.  Dispense: 90 tablet; Refill: 1  9. History of gastrointestinal bleeding Refill pantoprazole.  Check iron level  10. Decreased hearing of both ears - Ambulatory referral to ENT  11. Encounter for screening mammogram for malignant neoplasm of breast - MM Digital Screening; Future  12. Normocytic anemia - Iron, TIBC and Ferritin Panel  13. Postoperative hypothyroidism - TSH  14. Influenza vaccination declined  Patient was given the opportunity to ask questions.  Patient verbalized understanding of the plan and was able to repeat key elements of the plan.   This documentation was completed using Radio producer.  Any transcriptional errors are unintentional.  No orders of the defined types were placed in this encounter.    Requested Prescriptions    No prescriptions requested or ordered in this encounter    No follow-ups on file.  Karle Plumber, MD, FACP

## 2022-06-19 ENCOUNTER — Other Ambulatory Visit (INDEPENDENT_AMBULATORY_CARE_PROVIDER_SITE_OTHER): Payer: Medicaid Other

## 2022-06-19 ENCOUNTER — Encounter (HOSPITAL_COMMUNITY): Payer: Self-pay

## 2022-06-19 ENCOUNTER — Other Ambulatory Visit (HOSPITAL_COMMUNITY): Payer: Self-pay

## 2022-06-19 ENCOUNTER — Other Ambulatory Visit: Payer: Self-pay

## 2022-06-19 ENCOUNTER — Encounter: Payer: Self-pay | Admitting: Physician Assistant

## 2022-06-19 ENCOUNTER — Ambulatory Visit (INDEPENDENT_AMBULATORY_CARE_PROVIDER_SITE_OTHER): Payer: Medicaid Other | Admitting: Physician Assistant

## 2022-06-19 DIAGNOSIS — M25531 Pain in right wrist: Secondary | ICD-10-CM | POA: Insufficient documentation

## 2022-06-19 LAB — BASIC METABOLIC PANEL
BUN/Creatinine Ratio: 18 (ref 9–23)
BUN: 17 mg/dL (ref 6–24)
CO2: 28 mmol/L (ref 20–29)
Calcium: 9.5 mg/dL (ref 8.7–10.2)
Chloride: 101 mmol/L (ref 96–106)
Creatinine, Ser: 0.93 mg/dL (ref 0.57–1.00)
Glucose: 52 mg/dL — ABNORMAL LOW (ref 70–99)
Potassium: 3.6 mmol/L (ref 3.5–5.2)
Sodium: 143 mmol/L (ref 134–144)
eGFR: 71 mL/min/{1.73_m2} (ref >59)

## 2022-06-19 LAB — TSH: TSH: 4.93 u[IU]/mL — ABNORMAL HIGH (ref 0.450–4.500)

## 2022-06-19 LAB — IRON,TIBC AND FERRITIN PANEL
Ferritin: 16 ng/mL (ref 15–150)
Iron Saturation: 25 % (ref 15–55)
Iron: 101 ug/dL (ref 27–159)
Total Iron Binding Capacity: 406 ug/dL (ref 250–450)
UIBC: 305 ug/dL (ref 131–425)

## 2022-06-19 NOTE — Progress Notes (Signed)
Office Visit Note   Patient: Ann Jarvis           Date of Birth: 1965-06-02           MRN: [id number] Visit Date: 06/19/2022              Requested by: Ladell Pier, MD [place],  [place] [address] PCP: Ladell Pier, MD   Assessment & Plan: Visit Diagnoses:  1. Pain in right wrist     Plan: Patient is a pleasant 57 year old woman who is approximately 5 months status post fall onto outstretched hand.  She was seen in the emergency room where an x-ray of her right wrist demonstrated a nondisplaced intra-articular distal radius fracture.  She was given a splint and told to follow-up with orthopedics.  Unknown what she did before then.  She comes in today complaining of continued right wrist pain.  Denies any paresthesias.  She is right-handed.  X-rays do not demonstrate any displacement of the fracture.  I think at this point she would do well with some hand therapy.  We also talked about topical Voltaren gel.  She is in need of a new wrist plant which she can wear as needed.  I think she is fine giving this a bit more time if at any point she just does not feel she is getting better she can call us and we will refer her to a hand specialist  Follow-Up Instructions: Return if symptoms worsen or fail to improve.   Orders:  Orders Placed This Encounter  Procedures   XR Wrist Complete Right   Ambulatory referral to Occupational Therapy   No orders of the defined types were placed in this encounter.     Procedures: No procedures performed   Clinical Data: No additional findings.   Subjective: Chief Complaint  Patient presents with   Right Wrist - Pain    HPI Ann Jarvis is a pleasant 57 year old woman who fell onto her right outstretched hand approximately 5 months ago.  She was seen and evaluated in the emergency room and told she had a wrist fracture she comes in today complaining of right wrist pain.  She had been referred to  follow-up with Ortho for the wrist but has not done so Review of Systems  All other systems reviewed and are negative.    Objective: Vital Signs: LMP 10/07/2015   Physical Exam Constitutional:      Appearance: Normal appearance.  Pulmonary:     Effort: Pulmonary effort is normal.  Skin:    General: Skin is warm and dry.  Neurological:     General: No focal deficit present.     Mental Status: She is alert.     Ortho Exam Examination of her right wrist her hand is warm with brisk capillary refill.  She has a strong palpable radial pulse she is able to oppose all of her fingers grip strength is intact.  She does have some tenderness to palpation over the dorsal distal radius.  No tenderness over the anatomic snuffbox.  Compartments of the arm are soft nontender she is neurovascular intact Specialty Comments:  No specialty comments available.  Imaging: XR Wrist Complete Right  Result Date: 06/19/2022 Radiographs of her right wrist were obtained today.  She has nondisplaced healed distal radius fracture which was intra-articular.  Do not appreciate any displacement    PMFS History: Patient Active Problem List   Diagnosis Date Noted  Pain in right wrist 06/19/2022   AVM (arteriovenous malformation) of small bowel, acquired with hemorrhage    Melena    GI bleed 12/24/2021   Recent cerebrovascular accident (CVA) 12/24/2021   Acute blood loss anemia 12/24/2021   AKI (acute kidney injury) ([place]) 12/24/2021   Polysubstance abuse ([place]) 12/24/2021   Acute CVA (cerebrovascular accident) ([place]) 12/03/2021   Gastroesophageal reflux disease without esophagitis 07/16/2019   Hot flashes 03/13/2017   Chronic pain of right knee 12/12/2016   Chronic pain of left ankle 12/12/2016   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 Carlis Abbott. On Methimazole   Hypertension    Scabies    Tobacco abuse     History reviewed. No pertinent family history.  Past Surgical History:  Procedure Laterality Date   BTL     COLONOSCOPY WITH PROPOFOL N/A 12/26/2021   Procedure: COLONOSCOPY WITH PROPOFOL;  Surgeon: Doran Stabler, MD;  Location: No Name;  Service: Gastroenterology;  Laterality: N/A;   ESOPHAGOGASTRODUODENOSCOPY (EGD) WITH PROPOFOL N/A 12/25/2021   Procedure: ESOPHAGOGASTRODUODENOSCOPY (EGD) WITH PROPOFOL;  Surgeon: Doran Stabler, MD;  Location: [facility];  Service: Gastroenterology;  Laterality: N/A;   GIVENS CAPSULE STUDY N/A 12/27/2021   Procedure: GIVENS CAPSULE STUDY;  Surgeon: Doran Stabler, MD;  Location: [facility];  Service: Gastroenterology;  Laterality: N/A;   MOUTH SURGERY     Social History   Occupational History   Not on file  Tobacco Use   Smoking status: Every Day    Packs/day: .25    Types: Cigarettes   Smokeless tobacco: Never   Tobacco comments:    1 cig a day  Substance and Sexual Activity   Alcohol use: Yes    Alcohol/week: 1.0 - 2.0 standard drink of alcohol    Types: 1 - 2 Cans of beer per week    Comment: occ   Drug use: Yes    Types: Marijuana, Cocaine    Comment: marijuana   Sexual activity: Not on file

## 2022-06-20 ENCOUNTER — Other Ambulatory Visit: Payer: Self-pay

## 2022-06-20 ENCOUNTER — Other Ambulatory Visit (HOSPITAL_COMMUNITY): Payer: Self-pay

## 2022-07-19 ENCOUNTER — Telehealth: Payer: Self-pay | Admitting: Internal Medicine

## 2022-07-19 ENCOUNTER — Ambulatory Visit: Payer: Medicaid Other | Admitting: Pharmacist

## 2022-07-19 NOTE — Telephone Encounter (Signed)
Pt is calling to request a referral to a drug rehab program. Please advise CB- [phone]

## 2022-07-19 NOTE — Telephone Encounter (Signed)
Routing to sw 

## 2022-07-22 NOTE — Telephone Encounter (Signed)
Hey, what's the concern?

## 2022-07-22 NOTE — Telephone Encounter (Signed)
I will follow up with pt but I called 3 times today and no answer and could not leave voicemail due to it being full. Below are rehab options in the event she calls back and I can't speak with her. [facility] -[address] [place], [place] [phone] or [phone] 24hrs a day. 24hours they are open for walk-ins. Free for those who do not have insurance. Patient would need to bring any medications they may be taking with them.    [facility] Address: [address] [place], [place] [address] Phone: [phone] Age Range: Adults & Children (Ages 3+) Specialty Areas: Mental Health and Substance Abuse Counseling Services  20. [facility]  (Suboxone and methodone) [phone] [address], [place], [place] [address] To Be Eligible for Opioid Treatment at [facility] you must be at least 59 years of age you have already tried other interventions that were not successful such as opioid detox, inpatient rehab for opioids, or outpatient counseling specifically for opioid dependency your [facility] drug test must be completely free of benzodiazepines (klonopin, xanax, valium, ativan, or other benz) you have reliable transportation to the [facility] clinic in [place] you recognize that counseling is a critical component of [facility]' Opioid Program and you agree to attend all required counseling sessions you are committed to total drug abstinence and will conscientiously strive to remain free of alcohol, marijuana, and other illicit substances while in treatment you desire a peaceful treatment atmosphere in which personal responsibility and respect toward staff and clients is the norm   21. [facility] [address] [place], [place], [place] [address] Offers SAIOP (Substance Abuse Intensive Outpatient Program) [phone] 22. [facility] [address] [place], [place] [address] [phone] (Accepts medicare)  For those who are tech savvy, go on psychology  today, type in your local city (i.e. [place]. [place]) and specify your insurance at the top of the screen after you search. (Medicaid if needed). You can also specify whether you are interested in therapy and psychiatry.  [email or web address].com/us

## 2022-07-30 ENCOUNTER — Other Ambulatory Visit: Payer: Self-pay

## 2022-07-30 ENCOUNTER — Encounter: Payer: Self-pay | Admitting: Pharmacist

## 2022-07-30 ENCOUNTER — Other Ambulatory Visit (HOSPITAL_COMMUNITY): Payer: Self-pay

## 2022-07-30 ENCOUNTER — Ambulatory Visit: Payer: Medicaid Other | Attending: Internal Medicine | Admitting: Pharmacist

## 2022-07-30 VITALS — BP 168/76 | HR 54

## 2022-07-30 DIAGNOSIS — I10 Essential (primary) hypertension: Secondary | ICD-10-CM | POA: Diagnosis not present

## 2022-07-30 MED ORDER — HYDROCHLOROTHIAZIDE 25 MG PO TABS
25.0000 mg | ORAL_TABLET | Freq: Every day | ORAL | 1 refills | Status: DC
Start: 2022-07-30 — End: 2023-06-24
  Filled 2022-07-30 – 2023-01-10 (×3): qty 90, 90d supply, fill #0

## 2022-07-30 NOTE — Progress Notes (Signed)
   S:     No chief complaint on file.  57 y.o. female who presents for hypertension evaluation, education, and management.  PMH is significant for HTN, GERD, hypothyroidism, history of stroke.  Patient was referred and last seen by Primary Care Provider, Dr. Laural Benes, on 06/18/2022.  At last visit, BP was 212/69 and reported being out of medications.   Today, patient arrives in good spirits and presents without assistance. Patient reports taking medications regularly and missing doses about once a week.   Patient reports hypertension was diagnosed in longstanding.   Family/Social history:  HTN in mother  Continues to smoke daily and use cocaine and marijuana  Medication adherence improved, reports missing medications about once a week . Patient has not taken BP medications today.   Current antihypertensives include: amlodipine 10 mg daily, carvedilol 6.25 mg BID, hydralazine 100 mg TID, Imdur 30 mg daily, losartan 50 mg daily  Reported home BP readings: not taking  Patient reported dietary habits: Limits sodium Endorses regular caffeine intake with coffee  Patient-reported exercise habits: walking  O:  Vitals:   07/30/22 1512  BP: (!) 168/76  Pulse: (!) 54    Last 3 Office BP readings: BP Readings from Last 3 Encounters:  07/30/22 (!) 168/76  06/18/22 (!) 212/69  05/24/22 (!) 177/99    BMET    Component Value Date/Time   NA 143 06/18/2022 1226   K 3.6 06/18/2022 1226   CL 101 06/18/2022 1226   CO2 28 06/18/2022 1226   GLUCOSE 52 (L) 06/18/2022 1226   GLUCOSE 84 05/24/2022 1133   BUN 17 06/18/2022 1226   CREATININE 0.93 06/18/2022 1226   CREATININE 1.11 (H) 05/07/2016 1409   CALCIUM 9.5 06/18/2022 1226   GFRNONAA 49 (L) 05/24/2022 1133   GFRNONAA 57 (L) 05/07/2016 1409   GFRAA >60 10/28/2019 1950   GFRAA 66 05/07/2016 1409    Renal function: CrCl cannot be calculated (Patient's most recent lab result is older than the maximum 21 days allowed.).  Clinical  ASCVD: Yes  The ASCVD Risk score (Arnett DK, et al., 2019) failed to calculate for the following reasons:   The patient has a prior MI or stroke diagnosis  A/P: Hypertension longstanding currently uncontrolled on current medications. BP goal < 130/80 mmHg. Medication adherence appears improved.  -Started hydrochlorothiazide 25 mg daily.  -Continued amlodipine 10 mg daily, carvedilol 6.25 mg BID, hydralazine 100 mg TID, Imdur 30 mg daily, losartan 50 mg daily -Patient educated on purpose, proper use, and potential adverse effects of HCTZ.  -F/u labs ordered - none -Counseled on lifestyle modifications for blood pressure control including reduced dietary sodium, increased exercise, adequate sleep. -Encouraged patient to check BP at home and bring log of readings to next visit. Counseled on proper use of home BP cuff.   Results reviewed and written information provided.    Written patient instructions provided. Patient verbalized understanding of treatment plan.  Total time in face to face counseling 15 minutes.    Follow-up:  Pharmacist 1 month. PCP clinic visit in July 2024.   Georga Hacking, Pharm.D PGY1 Pharmacy Resident 07/30/2022 3:20 PM

## 2022-08-04 ENCOUNTER — Encounter: Payer: Self-pay | Admitting: Internal Medicine

## 2022-08-05 ENCOUNTER — Ambulatory Visit: Payer: Self-pay

## 2022-08-09 ENCOUNTER — Telehealth: Payer: Self-pay | Admitting: Internal Medicine

## 2022-08-12 NOTE — Telephone Encounter (Signed)
Of course, Ive called her before and she didn't answer. Called again today and her mailbox is full. I will send her some information to her MyChart as well.

## 2022-08-12 NOTE — Telephone Encounter (Signed)
Sent pt a Clinical cytogeneticist message with resources for rehab. Called pt today and she did not answer and voicemail was full

## 2022-08-15 ENCOUNTER — Telehealth: Payer: Self-pay | Admitting: Licensed Clinical Social Worker

## 2022-08-15 NOTE — Telephone Encounter (Signed)
Spoke with pt yesterday. She stated that she is going to [facility] this weekend for rehab. She has already called, gathered her medications and is expected to arrive this weekend.

## 2022-09-13 ENCOUNTER — Other Ambulatory Visit: Payer: Self-pay

## 2022-10-06 ENCOUNTER — Emergency Department (HOSPITAL_COMMUNITY): Payer: Medicaid Other

## 2022-10-06 ENCOUNTER — Encounter (HOSPITAL_COMMUNITY): Payer: Self-pay | Admitting: Emergency Medicine

## 2022-10-06 ENCOUNTER — Other Ambulatory Visit: Payer: Self-pay

## 2022-10-06 ENCOUNTER — Emergency Department (HOSPITAL_COMMUNITY)
Admission: EM | Admit: 2022-10-06 | Discharge: 2022-10-06 | Disposition: A | Discharge status: Home / Self Care | Payer: Medicaid Other | Attending: Emergency Medicine | Admitting: Emergency Medicine

## 2022-10-06 DIAGNOSIS — R0602 Shortness of breath: Secondary | ICD-10-CM | POA: Diagnosis present

## 2022-10-06 DIAGNOSIS — Z1152 Encounter for screening for COVID-19: Secondary | ICD-10-CM | POA: Diagnosis not present

## 2022-10-06 DIAGNOSIS — Z7982 Long term (current) use of aspirin: Secondary | ICD-10-CM | POA: Insufficient documentation

## 2022-10-06 LAB — BASIC METABOLIC PANEL
Anion gap: 13 (ref 5–15)
BUN: 16 mg/dL (ref 6–20)
CO2: 24 mmol/L (ref 22–32)
Calcium: 9.4 mg/dL (ref 8.9–10.3)
Chloride: 100 mmol/L (ref 98–111)
Creatinine, Ser: 0.97 mg/dL (ref 0.44–1.00)
GFR, Estimated: >60 mL/min (ref >60)
Glucose, Bld: 108 mg/dL — ABNORMAL HIGH (ref 70–99)
Potassium: 3.1 mmol/L — ABNORMAL LOW (ref 3.5–5.1)
Sodium: 137 mmol/L (ref 135–145)

## 2022-10-06 LAB — CBC
HCT: 40.4 % (ref 36.0–46.0)
Hemoglobin: 12.7 g/dL (ref 12.0–15.0)
MCH: 27.1 pg (ref 26.0–34.0)
MCHC: 31.4 g/dL (ref 30.0–36.0)
MCV: 86.1 fL (ref 80.0–100.0)
Platelets: 251 10*3/uL (ref 150–400)
RBC: 4.69 MIL/uL (ref 3.87–5.11)
RDW: 14 % (ref 11.5–15.5)
WBC: 5.1 10*3/uL (ref 4.0–10.5)
nRBC: 0 % (ref 0.0–0.2)

## 2022-10-06 LAB — BRAIN NATRIURETIC PEPTIDE: B Natriuretic Peptide: 34.5 pg/mL (ref 0.0–100.0)

## 2022-10-06 LAB — SARS CORONAVIRUS 2 BY RT PCR: SARS Coronavirus 2 by RT PCR: NEGATIVE

## 2022-10-06 LAB — TROPONIN I (HIGH SENSITIVITY): Troponin I (High Sensitivity): 7 ng/L (ref <18)

## 2022-10-06 LAB — D-DIMER, QUANTITATIVE: D-Dimer, Quant: 0.4 ug/mL-FEU (ref 0.00–0.50)

## 2022-10-06 MED ORDER — POTASSIUM CHLORIDE CRYS ER 20 MEQ PO TBCR
40.0000 meq | EXTENDED_RELEASE_TABLET | Freq: Once | ORAL | Status: AC
  Administered 2022-10-06: 40 meq via ORAL
  Filled 2022-10-06: qty 2

## 2022-10-06 NOTE — Discharge Instructions (Addendum)
You were evaluated in the Emergency Department and after careful evaluation, we did not find any emergent condition requiring admission or further testing in the hospital.  Your workup today was overall reassuring.  Your COVID and flu test are still pending, you may follow-up on these results via MyChart.  Your blood pressure was a little elevated today, please make sure to follow-up with your primary care doctor for this.  Please return to the Emergency Department if you experience any worsening of your condition.  We encourage you to follow up with a primary care provider.  Thank you for allowing [place] to be a part of your care.'

## 2022-10-06 NOTE — ED Notes (Signed)
Pt ambulated independently to bathroom twice and maintained sats of 95% or better.

## 2022-10-06 NOTE — ED Provider Notes (Signed)
[facility] EMERGENCY DEPARTMENT AT [facility] Provider Note   CSN: [id number] Arrival date & time: 10/06/22  1040     History  Chief Complaint  Patient presents with   Shortness of Breath    Ann Jarvis is a 57 y.o. female.  HPI 57 year old female presents to the ER with complaints of shortness of breath x 4 days.  Patient states that she was in [facility] when she started to feel worsening shortness of breath.  She also complains of right arm tightness and muscle spasms.  Denies any nausea, vomiting, abdominal pain, dysuria, chest pain.  No leg swelling.  No history of PE or DVT.  No other sick contacts though she does endorse a dry cough.  She states that she smokes cigarettes occasionally.  She reports returning today and continuing to feel short of breath which prompted her to come to the ER.  She has no known history of COPD or asthma.    Home Medications Prior to Admission medications   Medication Sig Start Date End Date Taking? Authorizing Provider  acetaminophen (TYLENOL) 500 MG tablet Take 1,000 mg by mouth 3 (three) times daily.    [provider]  amLODipine (NORVASC) 10 MG tablet Take 1 tablet (10 mg total) by mouth daily. *must keep upcoming appt* 06/18/22   Marcine Matar, MD  aspirin EC 81 MG tablet Take 1 tablet (81 mg total) by mouth daily. Swallow whole. 06/18/22   Marcine Matar, MD  atorvastatin (LIPITOR) 40 MG tablet Take 1 tablet (40 mg total) by mouth daily. 06/18/22   Marcine Matar, MD  carvedilol (COREG) 6.25 MG tablet Take 1 tablet (6.25 mg total) by mouth 2 (two) times daily. 06/18/22   Marcine Matar, MD  hydrALAZINE (APRESOLINE) 100 MG tablet Take 1 tablet (100 mg total) by mouth every 8 (eight) hours. 06/18/22   Marcine Matar, MD  hydrochlorothiazide (HYDRODIURIL) 25 MG tablet Take 1 tablet (25 mg total) by mouth daily. 07/30/22   Marcine Matar, MD  isosorbide mononitrate (IMDUR) 30 MG 24 hr tablet Take 1  tablet (30 mg total) by mouth daily. 06/18/22   Marcine Matar, MD  levothyroxine (SYNTHROID) 200 MCG tablet Take 1 tablet (200 mcg total) by mouth daily before breakfast. 06/18/22   Marcine Matar, MD  loratadine (CLARITIN) 10 MG tablet Take 1 tablet (10 mg total) by mouth daily. 06/18/22   Marcine Matar, MD  losartan (COZAAR) 50 MG tablet Take 1 tablet (50 mg total) by mouth daily. 06/18/22   Marcine Matar, MD  pantoprazole (PROTONIX) 40 MG tablet Take 1 tablet (40 mg total) by mouth daily. 06/18/22   Marcine Matar, MD  thiamine (VITAMIN B-1) 100 MG tablet Take 1 tablet (100 mg total) by mouth daily. 12/06/21   Pokhrel, Rebekah Chesterfield, MD      Allergies    Ciprofloxacin, Flagyl [metronidazole], and Penicillins    Review of Systems   Review of Systems Ten systems reviewed and are negative for acute change, except as noted in the HPI.   Physical Exam Updated Vital Signs BP (!) 141/107   Pulse 61   Temp 97.6 F (36.4 C)   Resp (!) 31   Wt 62 kg   LMP 10/07/2015   SpO2 100%   BMI 19.06 kg/m  Physical Exam Vitals and nursing note reviewed.  Constitutional:      General: She is not in acute distress.    Appearance: Normal appearance.  She is well-developed.  HENT:     Head: Normocephalic and atraumatic.  Eyes:     Extraocular Movements: Extraocular movements intact.     Conjunctiva/sclera: Conjunctivae normal.     Pupils: Pupils are equal, round, and reactive to light.  Cardiovascular:     Rate and Rhythm: Normal rate and regular rhythm.     Pulses: Normal pulses.     Heart sounds: Normal heart sounds. No murmur heard. Pulmonary:     Effort: Pulmonary effort is normal. No respiratory distress.     Breath sounds: Normal breath sounds. No decreased breath sounds, wheezing, rhonchi or rales.  Abdominal:     General: Abdomen is flat. Bowel sounds are normal. There is no distension.     Palpations: Abdomen is soft. There is no mass.     Tenderness: There is no abdominal  tenderness. There is no right CVA tenderness, left CVA tenderness, guarding or rebound.  Musculoskeletal:        General: No swelling. Normal range of motion.     Cervical back: Normal range of motion and neck supple. No tenderness.  Skin:    General: Skin is warm and dry.     Capillary Refill: Capillary refill takes less than 2 seconds.  Neurological:     General: No focal deficit present.     Mental Status: She is alert and oriented to person, place, and time.  Psychiatric:        Mood and Affect: Mood normal.        Behavior: Behavior normal.     ED Results / Procedures / Treatments   Labs (all labs ordered are listed, but only abnormal results are displayed) Labs Reviewed  BASIC METABOLIC PANEL - Abnormal; Notable for the following components:      Result Value   Potassium 3.1 (*)    Glucose, Bld 108 (*)    All other components within normal limits  SARS CORONAVIRUS 2 BY RT PCR  CBC  BRAIN NATRIURETIC PEPTIDE  D-DIMER, QUANTITATIVE  TROPONIN I (HIGH SENSITIVITY)  TROPONIN I (HIGH SENSITIVITY)    EKG None  Radiology DG Chest 2 View  Result Date: 10/06/2022 CLINICAL DATA:  Shortness of breath for 4 days. EXAM: CHEST - 2 VIEW COMPARISON:  03/09/2022 FINDINGS: The heart size and mediastinal contours are within normal limits. Both lungs are clear. The visualized skeletal structures are unremarkable. IMPRESSION: No active cardiopulmonary disease. Electronically Signed   By: Signa Kell M.D.   On: 10/06/2022 11:39    Procedures Procedures    Medications Ordered in ED Medications  potassium chloride SA (KLOR-CON M) CR tablet 40 mEq (40 mEq Oral Given 10/06/22 1353)    ED Course/ Medical Decision Making/ A&P             HEART Score: 2                Medical Decision Making Amount and/or Complexity of Data Reviewed Labs: ordered. Radiology: ordered.  Risk Prescription drug management.   57 year old female presenting with complaints of shortness of breath x 4  days.On arrival, she is not hypoxic, tachypneic or tachycardic.  She is speaking in full sentences.  On my evaluation, she already states that she feels much better and is not as short of breath.Her exam is unremarkable, her lung sounds are clear, she has no lower extremity edema.  Differential is broad but includes viral infection, ACS, PE, pneumonia, anemia, CHF  Labs ordered, reviewed, CBC unremarkable, stable hemoglobin, no  leukocytosis, BMP with mild hypokalemia of 3.1, normal renal function.  Initial troponin normal.  D-dimer is negative.  BNP is normal.  Chest x-ray ordered, reviewed, agree with radiology read, negative for acute findings.  EKG reviewed, normal sinus rhythm.  COVID and flu are still pending.  Given reassuring workup, I have low suspicion for PE, ACS.  She does have a history of Graves' disease but no evidence of thyroid storm, she is otherwise well-appearing.  No evidence of bleeding and hemoglobin is stable.  BNP is normal.  She does not appear volume overloaded on exam.  She had an echo done in September 2023 which showed an EF of 55 to 60% but did show hypertrophic cardiomyopathy.  He has no evidence of pneumonia on chest x-ray and is not hypoxic.  Her COVID and flu test are still pending but this would not change disposition.  She was encouraged to follow-up via MyChart on these results.  Patient was ambulated without difficulty, and she is requesting to be discharged.  I think this is reasonable.  I encouraged her to follow-up with her PCP if this continues.We discussed return precautions.  She voiced understanding and is agreeable.     Stable for discharge. Final Clinical Impression(s) / ED Diagnoses Final diagnoses:  Shortness of breath    Rx / DC Orders ED Discharge Orders     None         Leone Brand 10/06/22 1409    Linwood Dibbles, MD 10/09/22 [phone]

## 2022-10-06 NOTE — ED Triage Notes (Signed)
Pt complains of SOB x 4 days. Came back from beach today with worsening SOB. Right arm pain x 2 days denies injury

## 2022-10-10 ENCOUNTER — Other Ambulatory Visit (HOSPITAL_COMMUNITY): Payer: Self-pay

## 2022-10-10 ENCOUNTER — Other Ambulatory Visit: Payer: Self-pay

## 2022-10-18 ENCOUNTER — Ambulatory Visit: Payer: Self-pay | Admitting: Internal Medicine

## 2022-12-17 ENCOUNTER — Emergency Department (HOSPITAL_COMMUNITY): Payer: Medicaid Other

## 2022-12-17 ENCOUNTER — Encounter (HOSPITAL_COMMUNITY): Payer: Self-pay | Admitting: Emergency Medicine

## 2022-12-17 ENCOUNTER — Emergency Department (HOSPITAL_COMMUNITY)
Admission: EM | Admit: 2022-12-17 | Discharge: 2022-12-17 | Disposition: A | Discharge status: Home / Self Care | Payer: Medicaid Other | Attending: Emergency Medicine | Admitting: Emergency Medicine

## 2022-12-17 DIAGNOSIS — Z7982 Long term (current) use of aspirin: Secondary | ICD-10-CM | POA: Diagnosis not present

## 2022-12-17 DIAGNOSIS — M79671 Pain in right foot: Secondary | ICD-10-CM | POA: Insufficient documentation

## 2022-12-17 NOTE — ED Triage Notes (Signed)
Pt here from home with c/o right foot pain , foot is slight swollen to the outside edge , no trauma noted

## 2022-12-17 NOTE — Discharge Instructions (Signed)
Your x-ray today was reassuring.  I do not see any evidence of infection or broken bones.  I recommend you take Tylenol, Motrin for pain and follow-up closely with a doctor.  If you develop redness, severe pain, severe swelling you should return to the ED.

## 2022-12-17 NOTE — ED Provider Notes (Signed)
[place] EMERGENCY DEPARTMENT AT [facility] Provider Note   CSN: [id number] Arrival date & time: 12/17/22  1220     History  Chief Complaint  Patient presents with   Foot Pain    Ann Jarvis is a 57 y.o. female.   Foot Pain  57 year old female presenting for right foot pain.  She states he woke up this morning with pain to the outer bottom aspect of her right foot.  She is not aware of any trauma.  She is not noted any swelling or redness.  No fevers or chills.  She is able to walk but has pain with walking.  No pain in her ankle, toes, leg.  No numbness or tingling.     Home Medications Prior to Admission medications   Medication Sig Start Date End Date Taking? Authorizing Provider  acetaminophen (TYLENOL) 500 MG tablet Take 1,000 mg by mouth 3 (three) times daily.    [provider]  amLODipine (NORVASC) 10 MG tablet Take 1 tablet (10 mg total) by mouth daily. *must keep upcoming appt* 06/18/22   Marcine Matar, MD  aspirin EC 81 MG tablet Take 1 tablet (81 mg total) by mouth daily. Swallow whole. 06/18/22   Marcine Matar, MD  atorvastatin (LIPITOR) 40 MG tablet Take 1 tablet (40 mg total) by mouth daily. 06/18/22   Marcine Matar, MD  carvedilol (COREG) 6.25 MG tablet Take 1 tablet (6.25 mg total) by mouth 2 (two) times daily. 06/18/22   Marcine Matar, MD  hydrALAZINE (APRESOLINE) 100 MG tablet Take 1 tablet (100 mg total) by mouth every 8 (eight) hours. 06/18/22   Marcine Matar, MD  hydrochlorothiazide (HYDRODIURIL) 25 MG tablet Take 1 tablet (25 mg total) by mouth daily. 07/30/22   Marcine Matar, MD  isosorbide mononitrate (IMDUR) 30 MG 24 hr tablet Take 1 tablet (30 mg total) by mouth daily. 06/18/22   Marcine Matar, MD  levothyroxine (SYNTHROID) 200 MCG tablet Take 1 tablet (200 mcg total) by mouth daily before breakfast. 06/18/22   Marcine Matar, MD  loratadine (CLARITIN) 10 MG tablet Take 1 tablet (10 mg total) by  mouth daily. 06/18/22   Marcine Matar, MD  losartan (COZAAR) 50 MG tablet Take 1 tablet (50 mg total) by mouth daily. 06/18/22   Marcine Matar, MD  pantoprazole (PROTONIX) 40 MG tablet Take 1 tablet (40 mg total) by mouth daily. 06/18/22   Marcine Matar, MD  thiamine (VITAMIN B-1) 100 MG tablet Take 1 tablet (100 mg total) by mouth daily. 12/06/21   Pokhrel, Rebekah Chesterfield, MD      Allergies    Ciprofloxacin, Flagyl [metronidazole], and Penicillins    Review of Systems   Review of Systems Review of systems completed and notable as per HPI.  ROS otherwise negative.   Physical Exam Updated Vital Signs BP (!) 162/70 (BP Location: Left Arm)   Pulse 60   Temp 97.9 F (36.6 C) (Oral)   Resp 18   LMP 10/07/2015   SpO2 100%  Physical Exam Vitals and nursing note reviewed.  Constitutional:      General: She is not in acute distress.    Appearance: She is well-developed.  HENT:     Head: Normocephalic and atraumatic.     Nose: Nose normal.     Mouth/Throat:     Mouth: Mucous membranes are moist.     Pharynx: Oropharynx is clear.  Eyes:     Extraocular Movements:  Extraocular movements intact.     Conjunctiva/sclera: Conjunctivae normal.     Pupils: Pupils are equal, round, and reactive to light.  Cardiovascular:     Rate and Rhythm: Normal rate and regular rhythm.     Heart sounds: No murmur heard. Pulmonary:     Effort: Pulmonary effort is normal. No respiratory distress.     Breath sounds: Normal breath sounds.  Abdominal:     Palpations: Abdomen is soft.     Tenderness: There is no abdominal tenderness. There is no guarding or rebound.  Musculoskeletal:        General: No swelling.     Cervical back: Neck supple.     Right lower leg: No edema.     Left lower leg: No edema.     Comments: Mild tenderness to the plantar aspect of the right foot on the lateral aspect just posterior to the base of the fifth metatarsal.  There is no external trauma.  The skin is not swollen,  erythematous, warm.  No rash or wounds.  She has normal strength and range of motion of the toes and ankle.  She has no tenderness around the ankle or the rest of the foot.  Her calf is not swollen.  She has 2+ DP and PT pulse.  Skin:    General: Skin is warm and dry.     Capillary Refill: Capillary refill takes less than 2 seconds.  Neurological:     General: No focal deficit present.     Mental Status: She is alert and oriented to person, place, and time. Mental status is at baseline.     Sensory: No sensory deficit.     Motor: No weakness.  Psychiatric:        Mood and Affect: Mood normal.     ED Results / Procedures / Treatments   Labs (all labs ordered are listed, but only abnormal results are displayed) Labs Reviewed - No data to display  EKG None  Radiology DG Foot Complete Right  Result Date: 12/17/2022 CLINICAL DATA:  Right foot pain, swelling EXAM: RIGHT FOOT COMPLETE - 3+ VIEW COMPARISON:  None Available. FINDINGS: Frontal, oblique, and lateral views of the right foot are obtained. No acute fracture, subluxation, or dislocation. Joint spaces are well preserved. Soft tissues are unremarkable. IMPRESSION: 1. Unremarkable right foot. Electronically Signed   By: Sharlet Salina M.D.   On: 12/17/2022 15:00    Procedures Procedures    Medications Ordered in ED Medications - No data to display  ED Course/ Medical Decision Making/ A&P                                 Medical Decision Making Amount and/or Complexity of Data Reviewed Radiology: ordered.   Medical Decision Making:   Ann Jarvis is a 57 y.o. female who presented to the ED today with atraumatic right foot pain.  Vital signs reviewed.  On exam she is well-appearing.  She is mild tenderness to the plantar aspect of her foot.  I do not see any swelling, erythema, or skin changes suggest abscess or cellulitis.  She is neurovascularly intact.  X-ray reviewed, no signs of fracture, osteomyelitis or other acute  abnormality.  Unclear what is causing her pain, could be related to increased walking recently or wonder if she could have hit it in her sleep.  I do not see any indication for further workup right now.  Recommend close PCP follow-up.  Return precautions given.  Consider DVT as well, however no swelling and particular no symptoms in the calf or the rest of her leg makes this less likely.  Reviewed and confirmed nursing documentation for past medical history, family history, social history.   Patient's presentation is most consistent with acute, uncomplicated illness.           Final Clinical Impression(s) / ED Diagnoses Final diagnoses:  Right foot pain    Rx / DC Orders ED Discharge Orders     None         Laurence Spates, MD 12/17/22 1702

## 2023-01-10 ENCOUNTER — Other Ambulatory Visit: Payer: Self-pay | Admitting: Internal Medicine

## 2023-01-10 ENCOUNTER — Other Ambulatory Visit (HOSPITAL_COMMUNITY): Payer: Self-pay

## 2023-01-10 ENCOUNTER — Other Ambulatory Visit: Payer: Self-pay

## 2023-01-10 ENCOUNTER — Ambulatory Visit: Payer: Self-pay

## 2023-01-10 DIAGNOSIS — I10 Essential (primary) hypertension: Secondary | ICD-10-CM

## 2023-01-10 DIAGNOSIS — I6930 Unspecified sequelae of cerebral infarction: Secondary | ICD-10-CM

## 2023-01-10 MED ORDER — LOSARTAN POTASSIUM 50 MG PO TABS
50.0000 mg | ORAL_TABLET | Freq: Every day | ORAL | 0 refills | Status: DC
  Filled 2023-01-10: qty 90, 90d supply, fill #0

## 2023-01-10 MED ORDER — HYDRALAZINE HCL 100 MG PO TABS
100.0000 mg | ORAL_TABLET | Freq: Three times a day (TID) | ORAL | 0 refills | Status: DC
Start: 2023-01-10 — End: 2023-06-24
  Filled 2023-01-10: qty 270, 90d supply, fill #0

## 2023-01-10 MED ORDER — LEVOTHYROXINE SODIUM 200 MCG PO TABS
200.0000 ug | ORAL_TABLET | Freq: Every day | ORAL | 0 refills | Status: DC
  Filled 2023-01-10: qty 90, 90d supply, fill #0

## 2023-01-10 MED ORDER — AMLODIPINE BESYLATE 10 MG PO TABS
10.0000 mg | ORAL_TABLET | Freq: Every day | ORAL | 0 refills | Status: DC
Start: 2023-01-10 — End: 2023-10-23
  Filled 2023-01-10: qty 90, 90d supply, fill #0

## 2023-01-10 MED ORDER — CARVEDILOL 6.25 MG PO TABS
6.2500 mg | ORAL_TABLET | Freq: Two times a day (BID) | ORAL | 0 refills | Status: DC
Start: 2023-01-10 — End: 2023-06-24
  Filled 2023-01-10: qty 180, 90d supply, fill #0

## 2023-01-10 MED ORDER — ISOSORBIDE MONONITRATE ER 30 MG PO TB24
30.0000 mg | ORAL_TABLET | Freq: Every day | ORAL | 0 refills | Status: DC
Start: 2023-01-10 — End: 2023-10-23
  Filled 2023-01-10: qty 90, 90d supply, fill #0

## 2023-01-10 MED ORDER — ATORVASTATIN CALCIUM 40 MG PO TABS
40.0000 mg | ORAL_TABLET | Freq: Every day | ORAL | 0 refills | Status: DC
Start: 2023-01-10 — End: 2023-10-23
  Filled 2023-01-10: qty 90, 90d supply, fill #0

## 2023-01-10 NOTE — Telephone Encounter (Signed)
Chief Complaint: Blurred vision Symptoms: Bilateral blurred vision, headache Frequency: gradual Pertinent Negatives: Patient denies eye pain, loss of vision Disposition: [] ED /[] Urgent Care (no appt availability in office) / [x] Appointment(In office/virtual)/ []  [place] [facility]/ [] Home Care/ [] Refused Recommended Disposition /[] [place] [facility]/ []  Follow-up with PCP Additional Notes: Patient states she has noticed blurred vision for about 2 weeks now that had a gradual onset. Patient reports having a headache sometimes with the blurred vision. Patient reports getting reading glasses from the store and that has been helpful but believes she may need some prescription glasses. Patient has not had an eye exam recently and is diabetic. Care advice given and patient had been scheduled to see PCP at 1st available appointment. Patient requesting recommendation for an eye doctor that she can see for an eye exam.   Summary: Vision issues   The patient called stating she has had vision issues for a couple of weeks. She thinks it may come from blood pressure as she is on medication but feels like it is still high. She has not currently taken it though but she will when she gets home. She has an appt in December as that is the soonest her provider is available and on the wait list. Please assist patient further.        Reason for Disposition  [1] Blurred vision or visual changes AND [2] gradual onset (e.g., weeks, months)  Answer Assessment - Initial Assessment Questions 1. DESCRIPTION: "How has your vision changed?" (e.g., complete vision loss, blurred vision, double vision, floaters, etc.)     Blurred vision 2. LOCATION: "One or both eyes?" If one, ask: "Which eye?"     Both eyes  3. SEVERITY: "Can you see anything?" If Yes, ask: "What can you see?" (e.g., fine print)     Moderate 4. ONSET: "When did this begin?" "Did it start suddenly or has this been gradual?"     About 2 weeks ago  gradually 5. PATTERN: "Does this come and go, or has it been constant since it started?"     Constant 6. PAIN: "Is there any pain in your eye(s)?"  (Scale 1-10; or mild, moderate, severe)   - NONE (0): No pain.   - MILD (1-3): Doesn't interfere with normal activities.   - MODERATE (4-7): Interferes with normal activities or awakens from sleep.    - SEVERE (8-10): Excruciating pain, unable to do any normal activities.     No 7. CONTACTS-GLASSES: "Do you wear contacts or glasses?"     No 8. CAUSE: "What do you think is causing this visual problem?"     I'm not sure  9. OTHER SYMPTOMS: "Do you have any other symptoms?" (e.g., confusion, headache, arm or leg weakness, speech problems)     Sometimes a headache  Protocols used: Vision Loss or Change-A-AH

## 2023-01-13 NOTE — Telephone Encounter (Signed)
Per patient request patient rescheduled

## 2023-01-20 ENCOUNTER — Other Ambulatory Visit: Payer: Self-pay

## 2023-01-21 ENCOUNTER — Other Ambulatory Visit: Payer: Self-pay

## 2023-02-04 ENCOUNTER — Ambulatory Visit: Payer: MEDICAID | Attending: Internal Medicine | Admitting: Internal Medicine

## 2023-02-04 DIAGNOSIS — M1712 Unilateral primary osteoarthritis, left knee: Secondary | ICD-10-CM | POA: Diagnosis not present

## 2023-02-04 DIAGNOSIS — M25552 Pain in left hip: Secondary | ICD-10-CM

## 2023-02-04 DIAGNOSIS — H547 Unspecified visual loss: Secondary | ICD-10-CM

## 2023-02-04 NOTE — Progress Notes (Signed)
Virtual Visit via Video Note  I connected with Ann Jarvis on 02/04/2023 at 12:20 PM by a video enabled telemedicine application and verified that I am speaking with the correct person using two identifiers.  Location: Patient: home Provider: Office   I discussed the limitations of evaluation and management by telemedicine and the availability of in person appointments. The patient expressed understanding and agreed to proceed.  History of Present Illness: Hx HTN, Tob dep, CVA left thalamic infarct 11/2021, GIB (11/2021 AVM nonbleeding on capsular endoscopy/transfuse 2 units PRBC) hypothyroid post treatment for Graves' disease, cocaine abuse, CKD stage 2-3, GERD, hearing impaired   This is an UC visit. Pt requesting referral to ortho for flare of pain and swelling in LT knee and pain in LT hip.  She has known OA of knees. She is also requesting referral for an eye exam.  States she has difficulty reading anything close-up.  Referred to ophthalmology earlier this year but states she did not go at that time.  Outpatient Encounter Medications as of 02/04/2023  Medication Sig   acetaminophen (TYLENOL) 500 MG tablet Take 1,000 mg by mouth 3 (three) times daily.   amLODipine (NORVASC) 10 MG tablet Take 1 tablet (10 mg total) by mouth daily. *must keep upcoming appt*   aspirin EC 81 MG tablet Take 1 tablet (81 mg total) by mouth daily. Swallow whole.   atorvastatin (LIPITOR) 40 MG tablet Take 1 tablet (40 mg total) by mouth daily.   carvedilol (COREG) 6.25 MG tablet Take 1 tablet (6.25 mg total) by mouth 2 (two) times daily.   hydrALAZINE (APRESOLINE) 100 MG tablet Take 1 tablet (100 mg total) by mouth every 8 (eight) hours.   hydrochlorothiazide (HYDRODIURIL) 25 MG tablet Take 1 tablet (25 mg total) by mouth daily.   isosorbide mononitrate (IMDUR) 30 MG 24 hr tablet Take 1 tablet (30 mg total) by mouth daily.   levothyroxine (SYNTHROID) 200 MCG tablet Take 1 tablet (200 mcg total) by mouth  daily before breakfast.   loratadine (CLARITIN) 10 MG tablet Take 1 tablet (10 mg total) by mouth daily.   losartan (COZAAR) 50 MG tablet Take 1 tablet (50 mg total) by mouth daily.   pantoprazole (PROTONIX) 40 MG tablet Take 1 tablet (40 mg total) by mouth daily.   thiamine (VITAMIN B-1) 100 MG tablet Take 1 tablet (100 mg total) by mouth daily.   No facility-administered encounter medications on file as of 02/04/2023.      Observations/Objective: Patient face was not seen as she held the phone to her ear so that she can hear me well.  She has difficulty hearing.  Assessment and Plan: 1. Osteoarthritis of left knee, unspecified osteoarthritis type Not able to take NSAIDs due to hx of GIB - AMB referral to orthopedics  2. Pain of left hip - AMB referral to orthopedics  3. Poor vision - Ambulatory referral to Ophthalmology   Follow Up Instructions: 1 mth for chronic ds management   I discussed the assessment and treatment plan with the patient. The patient was provided an opportunity to ask questions and all were answered. The patient agreed with the plan and demonstrated an understanding of the instructions.   The patient was advised to call back or seek an in-person evaluation if the symptoms worsen or if the condition fails to improve as anticipated.  I spent 6 minutes dedicated to the care of this patient on the date of this encounter to include previsit review of of her chart,  video visit with patient discussing diagnosis and management and postvisit entering of orders.  This note has been created with Education officer, environmental. Any transcriptional errors are unintentional.  Jonah Blue, MD

## 2023-02-07 ENCOUNTER — Ambulatory Visit: Payer: Self-pay | Admitting: Physician Assistant

## 2023-02-07 ENCOUNTER — Ambulatory Visit: Payer: MEDICAID | Admitting: Physician Assistant

## 2023-02-11 ENCOUNTER — Ambulatory Visit (INDEPENDENT_AMBULATORY_CARE_PROVIDER_SITE_OTHER): Payer: MEDICAID | Admitting: Physician Assistant

## 2023-02-11 ENCOUNTER — Encounter: Payer: Self-pay | Admitting: Physician Assistant

## 2023-02-11 ENCOUNTER — Other Ambulatory Visit: Payer: Self-pay

## 2023-02-11 DIAGNOSIS — M25562 Pain in left knee: Secondary | ICD-10-CM | POA: Diagnosis not present

## 2023-02-11 DIAGNOSIS — G8929 Other chronic pain: Secondary | ICD-10-CM

## 2023-02-11 NOTE — Progress Notes (Signed)
   Office Visit Note   Patient: Ann Jarvis           Date of Birth: 04-17-1965           MRN: [id number] Visit Date: 02/11/2023              Requested by: Marcine Matar, MD [address] [place],  [place] [address] PCP: Marcine Matar, MD   Assessment & Plan: Visit Diagnoses: No diagnosis found.  Plan: Ibuprofen Voltaren and physical therapy  Follow-Up Instructions: No follow-ups on file.   Orders:  No orders of the defined types were placed in this encounter.  No orders of the defined types were placed in this encounter.     Procedures: No procedures performed   Clinical Data: No additional findings.   Subjective: No chief complaint on file.   HPI  Review of Systems   Objective: Vital Signs: LMP 10/07/2015   Physical Exam  Ortho Exam  Specialty Comments:  No specialty comments available.  Imaging: No results found.   PMFS History: Patient Active Problem List   Diagnosis Date Noted   Pain in right wrist 06/19/2022   AVM (arteriovenous malformation) of small bowel, acquired with hemorrhage    Melena    GI bleed 12/24/2021   Recent cerebrovascular accident (CVA) 12/24/2021   Acute blood loss anemia 12/24/2021   AKI (acute kidney injury) (HCC) 12/24/2021   Polysubstance abuse (HCC) 12/24/2021   Acute CVA (cerebrovascular accident) (HCC) 12/03/2021   Gastroesophageal reflux disease without esophagitis 07/16/2019   Hot flashes 03/13/2017   Chronic pain of right knee 12/12/2016   Chronic pain of left ankle 12/12/2016   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 Chestine Spore. On Methimazole   Hypertension    Scabies    Tobacco abuse     History  reviewed. No pertinent family history.  Past Surgical History:  Procedure Laterality Date   BTL     COLONOSCOPY WITH PROPOFOL N/A 12/26/2021   Procedure: COLONOSCOPY WITH PROPOFOL;  Surgeon: Sherrilyn Rist, MD;  Location: [facility] ENDOSCOPY;  Service: Gastroenterology;  Laterality: N/A;   ESOPHAGOGASTRODUODENOSCOPY (EGD) WITH PROPOFOL N/A 12/25/2021   Procedure: ESOPHAGOGASTRODUODENOSCOPY (EGD) WITH PROPOFOL;  Surgeon: Sherrilyn Rist, MD;  Location: [facility] ENDOSCOPY;  Service: Gastroenterology;  Laterality: N/A;   GIVENS CAPSULE STUDY N/A 12/27/2021   Procedure: GIVENS CAPSULE STUDY;  Surgeon: Sherrilyn Rist, MD;  Location: [facility] ENDOSCOPY;  Service: Gastroenterology;  Laterality: N/A;   MOUTH SURGERY     Social History   Occupational History   Not on file  Tobacco Use   Smoking status: Every Day    Current packs/day: 0.25    Types: Cigarettes   Smokeless tobacco: Never   Tobacco comments:    1 cig a day  Substance and Sexual Activity   Alcohol use: Yes    Alcohol/week: 1.0 - 2.0 standard drink of alcohol    Types: 1 - 2 Cans of beer per week    Comment: occ   Drug use: Yes    Types: Marijuana, Cocaine    Comment: marijuana   Sexual activity: Not on file

## 2023-02-11 NOTE — Progress Notes (Signed)
Office Visit Note   Patient: Ann Jarvis           Date of Birth: [birth year]           MRN: [id number] Visit Date: 02/11/2023              Requested by: Marcine Matar, MD [address] [place],  [place] [address] PCP: Marcine Matar, MD   Assessment & Plan: Visit Diagnoses:  1. Chronic pain of left knee     Plan: Patient is a pleasant 57 year old woman with a history of left knee pain and arthritis.  Denies any injury but comes in today complaining of left knee pain.  Pain is accentuated with ascending and descending stairs.  X-rays reviewed today demonstrate degenerative changes especially in the patellofemoral joint she also has quite a bit of quadricep atrophy she is neurovascular intact and has no effusion or redness.  Have recommended a course of physical therapy as well as Voltaren and ibuprofen.  I offered her a steroid injection today and she declined this may follow-up as needed  Follow-Up Instructions: As needed  Orders:  Orders Placed This Encounter  Procedures   XR KNEE 3 VIEW LEFT   No orders of the defined types were placed in this encounter.     Procedures: No procedures performed   Clinical Data: No additional findings.   Subjective: Chief Complaint  Patient presents with   Left Knee - Pain    Pain in the left knee popping and swelling a lot  she also has pain in the left hip \\back  area     HPI pleasant 57 year old woman with a chief complaint of left knee pain no particular injury.  She has had problems in the past pain is accentuated with ascending and descending stairs  Review of Systems  All other systems reviewed and are negative.   Objective: Vital Signs: LMP 10/07/2015   Physical Exam Constitutional:      Appearance: Normal appearance.  Pulmonary:     Effort: Pulmonary effort is normal.  Skin:    General: Skin is warm and dry.  Neurological:     General: No focal deficit present.     Mental Status: She is  alert and oriented to person, place, and time.  Psychiatric:        Mood and Affect: Mood normal.        Behavior: Behavior normal.   Ortho Exam Examination of her left knee she is neurovascular intact no effusion no erythema she does have quad atrophy compartments are soft and compressible positive crepitus in the patellofemoral joint with range of motion Specialty Comments:  No specialty comments available.  Imaging: No results found.   PMFS History: Patient Active Problem List   Diagnosis Date Noted   Pain in right wrist 06/19/2022   AVM (arteriovenous malformation) of small bowel, acquired with hemorrhage    Melena    GI bleed 12/24/2021   Recent cerebrovascular accident (CVA) 12/24/2021   Acute blood loss anemia 12/24/2021   AKI (acute kidney injury) (HCC) 12/24/2021   Polysubstance abuse (HCC) 12/24/2021   Acute CVA (cerebrovascular accident) (HCC) 12/03/2021   Gastroesophageal reflux disease without esophagitis 07/16/2019   Hot flashes 03/13/2017   Chronic pain of right knee 12/12/2016   Chronic pain of left ankle 12/12/2016   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 Chestine Spore. On Methimazole   Hypertension    Scabies    Tobacco abuse     History reviewed. No pertinent family history.  Past Surgical History:  Procedure Laterality Date   BTL     COLONOSCOPY WITH PROPOFOL N/A 12/26/2021   Procedure: COLONOSCOPY WITH PROPOFOL;  Surgeon: Sherrilyn Rist, MD;  Location[facility] ENDOSCOPY;  Service: Gastroenterology;  Laterality: N/A;   ESOPHAGOGASTRODUODENOSCOPY (EGD) WITH PROPOFOL N/A 12/25/2021   Procedure: ESOPHAGOGASTRODUODENOSCOPY (EGD) WITH PROPOFOL;  Surgeon: Sherrilyn Rist, MD;  Location:  [facility] ENDOSCOPY;  Service: Gastroenterology;  Laterality: N/A;   GIVENS CAPSULE STUDY N/A 12/27/2021   Procedure: GIVENS CAPSULE STUDY;  Surgeon: Sherrilyn Rist, MD;  Location: [facility] ENDOSCOPY;  Service: Gastroenterology;  Laterality: N/A;   MOUTH SURGERY     Social History   Occupational History   Not on file  Tobacco Use   Smoking status: Every Day    Current packs/day: 0.25    Types: Cigarettes   Smokeless tobacco: Never   Tobacco comments:    1 cig a day  Substance and Sexual Activity   Alcohol use: Yes    Alcohol/week: 1.0 - 2.0 standard drink of alcohol    Types: 1 - 2 Cans of beer per week    Comment: occ   Drug use: Yes    Types: Marijuana, Cocaine    Comment: marijuana   Sexual activity: Not on file

## 2023-03-03 ENCOUNTER — Ambulatory Visit: Payer: MEDICAID | Attending: Physician Assistant | Admitting: Physical Therapy

## 2023-03-03 NOTE — Therapy (Unsigned)
OUTPATIENT PHYSICAL THERAPY LOWER EXTREMITY EVALUATION   Patient Name: Ann Jarvis MRN: [id number] DOB:1965-08-15, 57 y.o., female Today's Date: 03/03/2023  END OF SESSION:   Past Medical History:  Diagnosis Date   Cocaine abuse (HCC)    Grave's disease    s/p radioiodine ablation 03/05/10, F/B Dr. Chestine Spore. On Methimazole   Hypertension    Scabies    Tobacco abuse    Past Surgical History:  Procedure Laterality Date   BTL     COLONOSCOPY WITH PROPOFOL N/A 12/26/2021   Procedure: COLONOSCOPY WITH PROPOFOL;  Surgeon: Sherrilyn Rist, MD;  Location: [facility] ENDOSCOPY;  Service: Gastroenterology;  Laterality: N/A;   ESOPHAGOGASTRODUODENOSCOPY (EGD) WITH PROPOFOL N/A 12/25/2021   Procedure: ESOPHAGOGASTRODUODENOSCOPY (EGD) WITH PROPOFOL;  Surgeon: Sherrilyn Rist, MD;  Location: [facility] ENDOSCOPY;  Service: Gastroenterology;  Laterality: N/A;   GIVENS CAPSULE STUDY N/A 12/27/2021   Procedure: GIVENS CAPSULE STUDY;  Surgeon: Sherrilyn Rist, MD;  Location: [facility] ENDOSCOPY;  Service: Gastroenterology;  Laterality: N/A;   MOUTH SURGERY     Patient Active Problem List   Diagnosis Date Noted   Pain in right wrist 06/19/2022   AVM (arteriovenous malformation) of small bowel, acquired with hemorrhage    Melena    GI bleed 12/24/2021   Recent cerebrovascular accident (CVA) 12/24/2021   Acute blood loss anemia 12/24/2021   AKI (acute kidney injury) (HCC) 12/24/2021   Polysubstance abuse (HCC) 12/24/2021   Acute CVA (cerebrovascular accident) (HCC) 12/03/2021   Gastroesophageal reflux disease without esophagitis 07/16/2019   Hot flashes 03/13/2017   Chronic pain of right knee 12/12/2016   Chronic pain of left ankle 12/12/2016   Tensor fascia lata syndrome 07/22/2016   Hypothyroidism 11/13/2015   Bell's palsy 10/09/2011   TOBACCO ABUSE 10/10/2008   Cocaine abuse (HCC) 08/17/2007   Essential hypertension 08/17/2007    PCP: ***  REFERRING PROVIDER: Persons, [place], [place]  REFERRING  DIAG: [phone] (ICD-10-CM) - Chronic pain of left knee  THERAPY DIAG:  No diagnosis found.  Rationale for Evaluation and Treatment: {HABREHAB:27488}  ONSET DATE: ***  SUBJECTIVE:   SUBJECTIVE STATEMENT: ***  PERTINENT HISTORY: Examination of her left knee she is neurovascular intact no effusion no erythema she does have quad atrophy compartments are soft and compressible positive crepitus in the patellofemoral joint with range of motion  Patient is a pleasant 57 year old woman with a history of left knee pain and arthritis. Denies any injury but comes in today complaining of left knee pain. Pain is accentuated with ascending and descending stairs. X-rays reviewed today demonstrate degenerative changes especially in the patellofemoral joint she also has quite a bit of quadricep atrophy she is neurovascular intact and has no effusion or redness. Have recommended a course of physical therapy as well as Voltaren and ibuprofen. I offered her a steroid injection today and she declined this may follow-up as needed PAIN:  Are you having pain? {OPRCPAIN:27236}  PRECAUTIONS: {Therapy precautions:24002}  RED FLAGS: {PT Red Flags:29287}   WEIGHT BEARING RESTRICTIONS: {Yes ***/No:24003}  FALLS:  Has patient fallen in last 6 months? {fallsyesno:27318}  LIVING ENVIRONMENT: Lives with: {OPRC lives with:25569::"lives with their family"} Lives in: {Lives in:25570} Stairs: {opstairs:27293} Has following equipment at home: {Assistive devices:23999}  OCCUPATION: ***  PLOF: {PLOF:24004}  PATIENT GOALS: ***  NEXT MD VISIT: ***  OBJECTIVE:  Note: Objective measures were completed at Evaluation unless otherwise noted.  DIAGNOSTIC FINDINGS: ***  PATIENT SURVEYS:  {rehab surveys:24030}  COGNITION: Overall cognitive status: {cognition:24006}  SENSATION: {sensation:27233}  EDEMA:  {edema:24020}  MUSCLE LENGTH: Hamstrings: Right *** deg; Left *** deg Maisie Fus test: Right ***  deg; Left *** deg  POSTURE: {posture:25561}  PALPATION: ***  LOWER EXTREMITY ROM:  {AROM/PROM:27142} ROM Right eval Left eval  Hip flexion    Hip extension    Hip abduction    Hip adduction    Hip internal rotation    Hip external rotation    Knee flexion    Knee extension    Ankle dorsiflexion    Ankle plantarflexion    Ankle inversion    Ankle eversion     (Blank rows = not tested)  LOWER EXTREMITY MMT:  MMT Right eval Left eval  Hip flexion    Hip extension    Hip abduction    Hip adduction    Hip internal rotation    Hip external rotation    Knee flexion    Knee extension    Ankle dorsiflexion    Ankle plantarflexion    Ankle inversion    Ankle eversion     (Blank rows = not tested)  LOWER EXTREMITY SPECIAL TESTS:  {LEspecialtests:26242}  FUNCTIONAL TESTS:  {Functional tests:24029}  GAIT: Distance walked: *** Assistive device utilized: {Assistive devices:23999} Level of assistance: {Levels of assistance:24026} Comments: ***   TODAY'S TREATMENT:                                                                                                                              DATE: ***    PATIENT EDUCATION:  Education details: *** Person educated: {Person educated:25204} Education method: {Education Method:25205} Education comprehension: {Education Comprehension:25206}  HOME EXERCISE PROGRAM: ***  ASSESSMENT:  CLINICAL IMPRESSION: Patient is a *** y.o. *** who was seen today for physical therapy evaluation and treatment for ***.   OBJECTIVE IMPAIRMENTS: {opptimpairments:25111}.   ACTIVITY LIMITATIONS: {activitylimitations:27494}  PARTICIPATION LIMITATIONS: {participationrestrictions:25113}  PERSONAL FACTORS: {Personal factors:25162} are also affecting patient's functional outcome.   REHAB POTENTIAL: {rehabpotential:25112}  CLINICAL DECISION MAKING: {clinical decision making:25114}  EVALUATION COMPLEXITY: {Evaluation  complexity:25115}   GOALS: Goals reviewed with patient? {yes/no:20286}  SHORT TERM GOALS: Target date: *** *** Baseline: Goal status: INITIAL  2.  *** Baseline:  Goal status: INITIAL  3.  *** Baseline:  Goal status: INITIAL  4.  *** Baseline:  Goal status: INITIAL  5.  *** Baseline:  Goal status: INITIAL  6.  *** Baseline:  Goal status: INITIAL  LONG TERM GOALS: Target date: ***  *** Baseline:  Goal status: INITIAL  2.  *** Baseline:  Goal status: INITIAL  3.  *** Baseline:  Goal status: INITIAL  4.  *** Baseline:  Goal status: INITIAL  5.  *** Baseline:  Goal status: INITIAL  6.  *** Baseline:  Goal status: INITIAL   PLAN:  PT FREQUENCY: {rehab frequency:25116}  PT DURATION: {rehab duration:25117}  PLANNED INTERVENTIONS: {rehab planned interventions:25118::"97110-Therapeutic exercises","97530- Therapeutic [phone]- Neuromuscular re-education","97535- Self NFAO","13086- Manual therapy"}  PLAN FOR NEXT  SESSION: ***   Cereniti Curb, PT 03/03/2023, 7:53 AM

## 2023-03-21 ENCOUNTER — Telehealth: Payer: Self-pay | Admitting: Internal Medicine

## 2023-03-21 NOTE — Telephone Encounter (Signed)
Copied from CRM [id number]. Topic: General - Inquiry >> Mar 21, 2023  1:05 PM Ann Jarvis wrote: Reason for CRM: Pt requesting call back. Pt needing letter from provider for a second pair of dentures for medicaid to cover.   Pt requesting a call back from Dr. Laural Benes to discuss.

## 2023-03-24 ENCOUNTER — Ambulatory Visit: Payer: Medicaid Other | Admitting: Internal Medicine

## 2023-03-24 NOTE — Telephone Encounter (Signed)
Patient identified by name and date of birth.  Virtual appointment scheduled.

## 2023-04-22 ENCOUNTER — Telehealth (HOSPITAL_BASED_OUTPATIENT_CLINIC_OR_DEPARTMENT_OTHER): Payer: MEDICAID | Admitting: Internal Medicine

## 2023-04-22 DIAGNOSIS — Z029 Encounter for administrative examinations, unspecified: Secondary | ICD-10-CM

## 2023-04-22 DIAGNOSIS — H547 Unspecified visual loss: Secondary | ICD-10-CM | POA: Diagnosis not present

## 2023-04-22 DIAGNOSIS — Z972 Presence of dental prosthetic device (complete) (partial): Secondary | ICD-10-CM

## 2023-04-22 NOTE — Progress Notes (Signed)
Patient ID: Ann Jarvis, female   DOB: 1966/02/06, 58 y.o.   MRN: [id number] Virtual Visit via Video Note  I connected with Davia Rennis Harding on 04/22/2023 at 9:39 AM by a video enabled telemedicine application and verified that I am speaking with the correct person using two identifiers. The patient connected and was able to see me but she was unable to figure out how to turn her video on so this was entirely an audio visit.  Location: Patient: home Provider: Office   I discussed the limitations of evaluation and management by telemedicine and the availability of in person appointments. The patient expressed understanding and agreed to proceed.  History of Present Illness: Hx HTN, Tob dep, CVA left thalamic infarct 11/2021, GIB (11/2021 AVM nonbleeding on capsular endoscopy/transfuse 2 units PRBC) hypothyroid post treatment for Graves' disease, cocaine abuse, CKD stage 2-3, GERD, hearing impaired   Patient requesting a letter for Medicaid to allow her to get another pair of dentures.  She moved 2 to 3 months ago and during the move misplaced her dentures.  Since then she has had problems chewing her food well.  She contacted Medicaid about getting another pair but was told that she would need to get a letter from her primary indicating a need.  Her previous pair she had gotten about 2 years ago. She is also requesting a referral to get an eye exam.  Has difficulty reading things up close.   Outpatient Encounter Medications as of 04/22/2023  Medication Sig   acetaminophen (TYLENOL) 500 MG tablet Take 1,000 mg by mouth 3 (three) times daily.   amLODipine (NORVASC) 10 MG tablet Take 1 tablet (10 mg total) by mouth daily. *must keep upcoming appt*   aspirin EC 81 MG tablet Take 1 tablet (81 mg total) by mouth daily. Swallow whole.   atorvastatin (LIPITOR) 40 MG tablet Take 1 tablet (40 mg total) by mouth daily.   carvedilol (COREG) 6.25 MG tablet Take 1 tablet (6.25 mg total) by mouth 2 (two)  times daily.   hydrALAZINE (APRESOLINE) 100 MG tablet Take 1 tablet (100 mg total) by mouth every 8 (eight) hours.   hydrochlorothiazide (HYDRODIURIL) 25 MG tablet Take 1 tablet (25 mg total) by mouth daily.   isosorbide mononitrate (IMDUR) 30 MG 24 hr tablet Take 1 tablet (30 mg total) by mouth daily.   levothyroxine (SYNTHROID) 200 MCG tablet Take 1 tablet (200 mcg total) by mouth daily before breakfast.   loratadine (CLARITIN) 10 MG tablet Take 1 tablet (10 mg total) by mouth daily.   losartan (COZAAR) 50 MG tablet Take 1 tablet (50 mg total) by mouth daily.   pantoprazole (PROTONIX) 40 MG tablet Take 1 tablet (40 mg total) by mouth daily.   thiamine (VITAMIN B-1) 100 MG tablet Take 1 tablet (100 mg total) by mouth daily.   No facility-administered encounter medications on file as of 04/22/2023.      Observations/Objective: No observation done as patient was not able to use her camera  Assessment and Plan: 1. Administrative encounter (Primary) 2. Wears dentures Letter will be written requesting assistance with getting another pair of dentures.  However I told patient there is no guarantee that Medicaid would pay for another pair despite my letter.  She expressed understanding.    3. Poor vision - Ambulatory referral to Ophthalmology   Follow Up Instructions: PRN   I discussed the assessment and treatment plan with the patient. The patient was provided an opportunity to ask questions and all  were answered. The patient agreed with the plan and demonstrated an understanding of the instructions.   The patient was advised to call back or seek an in-person evaluation if the symptoms worsen or if the condition fails to improve as anticipated.  I spent 7 minutes dedicated to the care of this patient on the date of this encounter to include talking with the patient and post visit entering of orders and writing letter.  This note has been created with Print production planner. Any transcriptional errors are unintentional.  Jonah Blue, MD

## 2023-05-21 ENCOUNTER — Ambulatory Visit: Payer: Self-pay | Admitting: Internal Medicine

## 2023-05-21 NOTE — Telephone Encounter (Signed)
 Noted

## 2023-05-21 NOTE — Telephone Encounter (Signed)
 Called patient back. Patient states that she spoke with someone and got an appointment. Attempted further questioning but patient stated she was fine and had made an appointment. Patient gave no other details.   Copied from CRM [id number]. Topic: Clinical - Red Word Triage >> May 21, 2023  3:58 PM Tiffany B wrote: Red Word that prompted transfer to Nurse Triage: Caller experiencing blurred vision. Call dropped in the proccess of me transferring her over to NT. Attempted to call patient back and no response.

## 2023-05-22 ENCOUNTER — Telehealth: Payer: Self-pay | Admitting: Internal Medicine

## 2023-05-22 NOTE — Telephone Encounter (Signed)
 Copied from CRM [id number]. Topic: General - Other >> May 21, 2023  3:52 PM Tiffany B wrote: Reason for CRM: Patient requesting a referral for a second pair of denturs please send to Medicaid. Caller did not give any additional information.

## 2023-05-22 NOTE — Telephone Encounter (Signed)
 Called & spoke to the patient. Verified name & DOB. Patient confirmed that she did receive the letter but needs the same letter sent to [facility]. Informed patient that we are unable to do so but instructed the patient to take the letter to DSS. Patient expressed verbal understanding. No further assistance needed at this time.

## 2023-05-22 NOTE — Telephone Encounter (Signed)
 Patient did video visit with me on January 21 and requested letter for Medicaid at that time to assist with getting dentures.  Letter was generated.  Did she ever pick up the letter?

## 2023-05-27 ENCOUNTER — Ambulatory Visit (INDEPENDENT_AMBULATORY_CARE_PROVIDER_SITE_OTHER): Payer: Self-pay | Admitting: Primary Care

## 2023-05-30 ENCOUNTER — Ambulatory Visit (INDEPENDENT_AMBULATORY_CARE_PROVIDER_SITE_OTHER): Payer: Self-pay | Admitting: Primary Care

## 2023-06-02 ENCOUNTER — Telehealth (INDEPENDENT_AMBULATORY_CARE_PROVIDER_SITE_OTHER): Payer: Self-pay | Admitting: Primary Care

## 2023-06-02 NOTE — Telephone Encounter (Signed)
 Called pt to remind them about atp. Pt will be present

## 2023-06-03 ENCOUNTER — Encounter (INDEPENDENT_AMBULATORY_CARE_PROVIDER_SITE_OTHER): Payer: Self-pay

## 2023-06-03 ENCOUNTER — Ambulatory Visit (INDEPENDENT_AMBULATORY_CARE_PROVIDER_SITE_OTHER): Payer: MEDICAID | Admitting: Primary Care

## 2023-06-10 ENCOUNTER — Ambulatory Visit (INDEPENDENT_AMBULATORY_CARE_PROVIDER_SITE_OTHER): Payer: MEDICAID | Admitting: Primary Care

## 2023-06-17 ENCOUNTER — Telehealth (INDEPENDENT_AMBULATORY_CARE_PROVIDER_SITE_OTHER): Payer: Self-pay | Admitting: Primary Care

## 2023-06-17 NOTE — Telephone Encounter (Signed)
 Spoke to pt about appt.. Will be present

## 2023-06-24 ENCOUNTER — Other Ambulatory Visit: Payer: Self-pay

## 2023-06-24 ENCOUNTER — Other Ambulatory Visit (HOSPITAL_COMMUNITY): Payer: Self-pay

## 2023-06-24 ENCOUNTER — Encounter (INDEPENDENT_AMBULATORY_CARE_PROVIDER_SITE_OTHER): Payer: Self-pay | Admitting: Primary Care

## 2023-06-24 ENCOUNTER — Ambulatory Visit (INDEPENDENT_AMBULATORY_CARE_PROVIDER_SITE_OTHER): Payer: MEDICAID | Admitting: Primary Care

## 2023-06-24 VITALS — BP 143/78 | HR 65 | Wt 156.2 lb

## 2023-06-24 DIAGNOSIS — J0110 Acute frontal sinusitis, unspecified: Secondary | ICD-10-CM | POA: Diagnosis not present

## 2023-06-24 DIAGNOSIS — I10 Essential (primary) hypertension: Secondary | ICD-10-CM | POA: Diagnosis not present

## 2023-06-24 DIAGNOSIS — R599 Enlarged lymph nodes, unspecified: Secondary | ICD-10-CM

## 2023-06-24 DIAGNOSIS — Z760 Encounter for issue of repeat prescription: Secondary | ICD-10-CM | POA: Diagnosis not present

## 2023-06-24 LAB — POCT RAPID STREP A (OFFICE): Rapid Strep A Screen: NEGATIVE

## 2023-06-24 MED ORDER — FLUCONAZOLE 150 MG PO TABS
150.0000 mg | ORAL_TABLET | Freq: Every day | ORAL | 1 refills | Status: DC
Start: 2023-06-24 — End: 2023-10-23
  Filled 2023-06-24 (×3): qty 1, 1d supply, fill #0

## 2023-06-24 MED ORDER — AZITHROMYCIN 250 MG PO TABS
ORAL_TABLET | ORAL | 0 refills | Status: AC
  Filled 2023-06-24 (×4): qty 6, 5d supply, fill #0

## 2023-06-24 MED ORDER — CARVEDILOL 6.25 MG PO TABS
6.2500 mg | ORAL_TABLET | Freq: Two times a day (BID) | ORAL | 0 refills | Status: DC
Start: 2023-06-24 — End: 2023-10-23
  Filled 2023-06-24 (×3): qty 180, 90d supply, fill #0

## 2023-06-24 MED ORDER — HYDROCHLOROTHIAZIDE 25 MG PO TABS
25.0000 mg | ORAL_TABLET | Freq: Every day | ORAL | 1 refills | Status: DC
Start: 2023-06-24 — End: 2023-10-23
  Filled 2023-06-24 (×3): qty 90, 90d supply, fill #0

## 2023-06-24 NOTE — Progress Notes (Signed)
 [facility]  Ann Jarvis, is a 58 y.o. female  ION:[id number]  KGM:[id number]  DOB - [birth year]  Chief Complaint  Patient presents with   Medical Management of Chronic Issues   Medication Refill       Subjective:   Ann Jarvis is a 58 y.o. female here today for an acute visit.  Medication Refill Associated symptoms include headaches.  Sinus Problem This is a chronic problem. The current episode started in the past 7 days. The problem has been gradually worsening since onset. There has been no fever. The pain is moderate. Associated symptoms include headaches. Past treatments include oral decongestants. The treatment provided no relief.    No problems updated.  Comprehensive ROS Pertinent positive and negative noted in HPI   Allergies  Allergen Reactions   Ciprofloxacin Rash and Other (See Comments)    Significant rash, taking with Flagyl   Flagyl [Metronidazole] Rash and Other (See Comments)    Developed significant rash while taking with Cipro   Penicillins Swelling    Did it involve swelling of the face/tongue/throat, SOB, or low BP? No Did it involve sudden or severe rash/hives, skin peeling, or any reaction on the inside of your mouth or nose? No Did you need to seek medical attention at a hospital or doctor's office? No When did it last happen?  Pt states a long time ago     If all above answers are "NO", may proceed with cephalosporin use.     Past Medical History:  Diagnosis Date   Cocaine abuse (HCC)    Grave's disease    s/p radioiodine ablation 03/05/10, F/B Dr. Chestine Spore. On Methimazole   Hypertension    Scabies    Tobacco abuse     Current Outpatient Medications on File Prior to Visit  Medication Sig Dispense Refill   acetaminophen (TYLENOL) 500 MG tablet Take 1,000 mg by mouth 3 (three) times daily.     amLODipine (NORVASC) 10 MG tablet Take 1 tablet (10 mg total) by mouth daily. *must keep upcoming appt* 90 tablet 0    aspirin EC 81 MG tablet Take 1 tablet (81 mg total) by mouth daily. Swallow whole. 120 tablet 1   atorvastatin (LIPITOR) 40 MG tablet Take 1 tablet (40 mg total) by mouth daily. 90 tablet 0   isosorbide mononitrate (IMDUR) 30 MG 24 hr tablet Take 1 tablet (30 mg total) by mouth daily. 90 tablet 0   levothyroxine (SYNTHROID) 200 MCG tablet Take 1 tablet (200 mcg total) by mouth daily before breakfast. 90 tablet 0   loratadine (CLARITIN) 10 MG tablet Take 1 tablet (10 mg total) by mouth daily. 30 tablet 11   losartan (COZAAR) 50 MG tablet Take 1 tablet (50 mg total) by mouth daily. 90 tablet 0   pantoprazole (PROTONIX) 40 MG tablet Take 1 tablet (40 mg total) by mouth daily. 90 tablet 1   thiamine (VITAMIN B-1) 100 MG tablet Take 1 tablet (100 mg total) by mouth daily.     No current facility-administered medications on file prior to visit.   Health Maintenance  Topic Date Due   Pneumococcal Vaccination (1 of 2 - PCV) Never done   Mammogram  Never done   Zoster (Shingles) Vaccine (1 of 2) Never done   Pap with HPV screening  12/11/2018   Flu Shot  Never done   COVID-19 Vaccine (2 - 2024-25 season) 12/01/2022   DTaP/Tdap/Td vaccine (2 - Td or Tdap) 11/12/2025   Colon Cancer Screening  12/27/2031   Hepatitis C Screening  Completed   HIV Screening  Completed   HPV Vaccine  Aged Out    Objective:   Vitals:   06/24/23 1427  BP: (!) 143/78  Pulse: 65  SpO2: 98%  Weight: 156 lb 3.2 oz (70.9 kg)   BP Readings from Last 3 Encounters:  06/24/23 (!) 143/78  12/17/22 (!) 162/70  10/06/22 (!) 141/107      Physical Exam Vitals reviewed.  Constitutional:      Appearance: Normal appearance. She is obese.  HENT:     Head: Normocephalic.     Comments: Throat red swollen lymph nodes and white patches r/o strep HOH    Right Ear: Tympanic membrane and external ear normal.     Left Ear: Tympanic membrane and external ear normal.     Ears:     Comments: BILATERAL CANAL RED USES Q TIPS D/W  RISK     Nose: Congestion present.     Comments: RED, SWOLLEN TURBINATES RT>LF    Mouth/Throat:     Mouth: Mucous membranes are moist.  Eyes:     Extraocular Movements: Extraocular movements intact.     Pupils: Pupils are equal, round, and reactive to light.  Cardiovascular:     Rate and Rhythm: Normal rate.  Pulmonary:     Effort: Pulmonary effort is normal.     Breath sounds: Normal breath sounds.  Abdominal:     General: Bowel sounds are normal.     Palpations: Abdomen is soft.  Musculoskeletal:        General: Normal range of motion.     Cervical back: Normal range of motion.  Skin:    General: Skin is warm and dry.  Neurological:     Mental Status: She is alert and oriented to person, place, and time.  Psychiatric:        Mood and Affect: Mood normal.        Behavior: Behavior normal.        Thought Content: Thought content normal.    Assessment & Plan   Ann Jarvis was seen today for medical management of chronic issues and medication refill.  Diagnoses and all orders for this visit:  Acute frontal sinusitis, recurrence not specified Tender frontal, maxillary and cervical chains throat red R/o strep tx for infection  Essential hypertension BP goal - < 130/80 Explained that having normal blood pressure is the goal and medications are helping to get to goal and maintain normal blood pressure. DIET: Limit salt intake, read nutrition labels to check salt content, limit fried and high fatty foods  Avoid using multisymptom OTC cold preparations that generally contain sudafed which can rise BP. Consult with pharmacist on best cold relief products to use for persons with HTN EXERCISE Discussed incorporating exercise such as walking - 30 minutes most days of the week and can do in 10 minute intervals    -     hydrochlorothiazide (HYDRODIURIL) 25 MG tablet; Take 1 tablet (25 mg total) by mouth daily. -     carvedilol (COREG) 6.25 MG tablet; Take 1 tablet (6.25 mg total) by  mouth 2 (two) times daily.  Swollen lymph nodes -     Rapid Strep A  Medication refill -     hydrochlorothiazide (HYDRODIURIL) 25 MG tablet; Take 1 tablet (25 mg total) by mouth daily. -     carvedilol (COREG) 6.25 MG tablet; Take 1 tablet (6.25 mg total) by mouth 2 (two) times daily.  Patient have been counseled extensively about nutrition and exercise. Other issues discussed during this visit include: low cholesterol diet, weight control and daily exercise, foot care, annual eye examinations at Ophthalmology, importance of adherence with medications and regular follow-up. We also discussed long term complications of uncontrolled diabetes and hypertension.   Return for First available with PCP , re-check blood pressure.  The patient was given clear instructions to go to ER or return to medical center if symptoms don't improve, worsen or new problems develop. The patient verbalized understanding. The patient was told to call to get lab results if they haven't heard anything in the next week.   This note has been created with Education officer, environmental. Any transcriptional errors are unintentional.   Grayce Sessions, NP 06/24/2023, 2:58 PM

## 2023-06-25 ENCOUNTER — Other Ambulatory Visit (HOSPITAL_COMMUNITY): Payer: Self-pay

## 2023-06-27 ENCOUNTER — Ambulatory Visit: Payer: MEDICAID | Admitting: Internal Medicine

## 2023-07-09 ENCOUNTER — Ambulatory Visit: Payer: MEDICAID | Admitting: Physician Assistant

## 2023-10-09 ENCOUNTER — Ambulatory Visit: Payer: MEDICAID | Admitting: Critical Care Medicine

## 2023-10-21 NOTE — Progress Notes (Unsigned)
   Established Patient Office Visit  Subjective   Patient ID: Ann Jarvis, female    DOB: [birth year]  Age: 58 y.o. MRN: [id number]  No chief complaint on file.   HPI  {History (Optional):23778}  ROS    Objective:     LMP 10/07/2015  {Vitals History (Optional):23777}  Physical Exam   No results found for any visits on 10/23/23.  {Labs (Optional):23779}  The ASCVD Risk score (Arnett DK, et al., 2019) failed to calculate for the following reasons:   Risk score cannot be calculated because patient has a medical history suggesting prior/existing ASCVD    Assessment & Plan:   Problem List Items Addressed This Visit   None   No follow-ups on file.    Belvie Silvan, MD

## 2023-10-23 ENCOUNTER — Ambulatory Visit: Payer: MEDICAID | Attending: Critical Care Medicine | Admitting: Critical Care Medicine

## 2023-10-23 ENCOUNTER — Other Ambulatory Visit: Payer: Self-pay

## 2023-10-23 ENCOUNTER — Encounter: Payer: Self-pay | Admitting: Critical Care Medicine

## 2023-10-23 VITALS — BP 222/116 | Resp 19 | Ht 71.0 in | Wt 164.4 lb

## 2023-10-23 DIAGNOSIS — I639 Cerebral infarction, unspecified: Secondary | ICD-10-CM

## 2023-10-23 DIAGNOSIS — Z760 Encounter for issue of repeat prescription: Secondary | ICD-10-CM

## 2023-10-23 DIAGNOSIS — E782 Mixed hyperlipidemia: Secondary | ICD-10-CM

## 2023-10-23 DIAGNOSIS — G8929 Other chronic pain: Secondary | ICD-10-CM

## 2023-10-23 DIAGNOSIS — K219 Gastro-esophageal reflux disease without esophagitis: Secondary | ICD-10-CM | POA: Diagnosis not present

## 2023-10-23 DIAGNOSIS — J302 Other seasonal allergic rhinitis: Secondary | ICD-10-CM

## 2023-10-23 DIAGNOSIS — I10 Essential (primary) hypertension: Secondary | ICD-10-CM

## 2023-10-23 DIAGNOSIS — M25561 Pain in right knee: Secondary | ICD-10-CM

## 2023-10-23 DIAGNOSIS — J309 Allergic rhinitis, unspecified: Secondary | ICD-10-CM | POA: Insufficient documentation

## 2023-10-23 DIAGNOSIS — E890 Postprocedural hypothyroidism: Secondary | ICD-10-CM

## 2023-10-23 DIAGNOSIS — F1910 Other psychoactive substance abuse, uncomplicated: Secondary | ICD-10-CM

## 2023-10-23 DIAGNOSIS — Z1231 Encounter for screening mammogram for malignant neoplasm of breast: Secondary | ICD-10-CM | POA: Insufficient documentation

## 2023-10-23 DIAGNOSIS — R0982 Postnasal drip: Secondary | ICD-10-CM

## 2023-10-23 DIAGNOSIS — I6930 Unspecified sequelae of cerebral infarction: Secondary | ICD-10-CM

## 2023-10-23 DIAGNOSIS — M25572 Pain in left ankle and joints of left foot: Secondary | ICD-10-CM

## 2023-10-23 MED ORDER — FLUTICASONE PROPIONATE 50 MCG/ACT NA SUSP
2.0000 | Freq: Every day | NASAL | 6 refills | Status: AC
  Filled 2023-10-23: qty 16, 30d supply, fill #0
  Filled 2023-10-23: qty 16, fill #0

## 2023-10-23 MED ORDER — LOSARTAN POTASSIUM 50 MG PO TABS
50.0000 mg | ORAL_TABLET | Freq: Every day | ORAL | 1 refills | Status: DC
  Filled 2023-10-23 (×2): qty 90, 90d supply, fill #0

## 2023-10-23 MED ORDER — ATORVASTATIN CALCIUM 40 MG PO TABS
40.0000 mg | ORAL_TABLET | Freq: Every day | ORAL | 2 refills | Status: AC
  Filled 2023-10-23 (×2): qty 90, 90d supply, fill #0
  Filled 2024-02-16: qty 90, 90d supply, fill #1

## 2023-10-23 MED ORDER — CLONIDINE HCL 0.1 MG PO TABS
0.1000 mg | ORAL_TABLET | Freq: Once | ORAL | Status: AC
Start: 2023-10-23 — End: 2023-10-23
  Administered 2023-10-23: 0.1 mg via ORAL

## 2023-10-23 MED ORDER — HYDROCHLOROTHIAZIDE 25 MG PO TABS
25.0000 mg | ORAL_TABLET | Freq: Every day | ORAL | 1 refills | Status: DC
  Filled 2023-10-23 (×2): qty 90, 90d supply, fill #0

## 2023-10-23 MED ORDER — LEVOTHYROXINE SODIUM 200 MCG PO TABS
200.0000 ug | ORAL_TABLET | Freq: Every day | ORAL | 1 refills | Status: AC
  Filled 2023-10-23 (×2): qty 90, 90d supply, fill #0
  Filled 2024-02-16: qty 90, 90d supply, fill #1

## 2023-10-23 MED ORDER — ISOSORBIDE MONONITRATE ER 30 MG PO TB24
30.0000 mg | ORAL_TABLET | Freq: Every day | ORAL | 1 refills | Status: AC
  Filled 2023-10-23 (×2): qty 90, 90d supply, fill #0
  Filled 2024-02-16: qty 90, 90d supply, fill #1

## 2023-10-23 MED ORDER — AMLODIPINE BESYLATE 10 MG PO TABS
10.0000 mg | ORAL_TABLET | Freq: Every day | ORAL | 2 refills | Status: DC
Start: 2023-10-23 — End: 2024-01-08
  Filled 2023-10-23 (×2): qty 90, 90d supply, fill #0

## 2023-10-23 MED ORDER — CARVEDILOL 6.25 MG PO TABS
6.2500 mg | ORAL_TABLET | Freq: Two times a day (BID) | ORAL | 1 refills | Status: AC
  Filled 2023-10-23 (×2): qty 180, 90d supply, fill #0
  Filled 2024-02-16: qty 180, 90d supply, fill #1

## 2023-10-23 MED ORDER — LEVOTHYROXINE SODIUM 200 MCG PO TABS
200.0000 ug | ORAL_TABLET | Freq: Every day | ORAL | 0 refills | Status: DC
  Filled 2023-10-23: qty 90, 90d supply, fill #0

## 2023-10-23 MED ORDER — LORATADINE 10 MG PO TABS
10.0000 mg | ORAL_TABLET | Freq: Every day | ORAL | 11 refills | Status: AC
  Filled 2023-10-23 (×2): qty 30, 30d supply, fill #0

## 2023-10-23 MED ORDER — ASPIRIN 81 MG PO TBEC
81.0000 mg | DELAYED_RELEASE_TABLET | Freq: Every day | ORAL | 1 refills | Status: AC
Start: 2023-10-23 — End: ?
  Filled 2023-10-23 (×2): qty 120, 120d supply, fill #0

## 2023-10-23 NOTE — Assessment & Plan Note (Signed)
 Obtain mammogram and at some point will need Pap smear

## 2023-10-23 NOTE — Assessment & Plan Note (Signed)
Reassess thyroid function continue Synthroid 

## 2023-10-23 NOTE — Assessment & Plan Note (Signed)
 History of acute CVA will send to physical therapy she never went to physical therapy

## 2023-10-23 NOTE — Assessment & Plan Note (Signed)
 Stop phenylephrine containing sinus medication and begin Flonase  and oral antihistamine

## 2023-10-23 NOTE — Assessment & Plan Note (Addendum)
 Hypertension not well-controlled at this time did not respond to clonidine  she has no acute organ findings have refilled all of her blood pressure medication she has not been taking any of these for a month.  This will include isosorbide  30 mg daily hydrochlorothiazide  25 mg daily carvedilol  6.25 mg twice daily amlodipine  10 mg daily and check labs Bring patient back for pharmacy visit 2 weeks

## 2023-10-23 NOTE — Assessment & Plan Note (Signed)
 I am not sure that she is using cocaine  or not

## 2023-10-23 NOTE — Patient Instructions (Signed)
 Medication sent to pharmacy downstairs please obtain these medicines  Labs obtained today  Referral to physical therapy made  Referral to orthopedics made for your hands  Return in 2 weeks for visit with clinical pharmacy for blood pressure return 2 months with Dr. Vicci

## 2023-10-23 NOTE — Assessment & Plan Note (Signed)
 Continue PPI.

## 2023-10-24 ENCOUNTER — Ambulatory Visit: Payer: Self-pay | Admitting: Critical Care Medicine

## 2023-10-24 LAB — CBC WITH DIFFERENTIAL/PLATELET
Basophils Absolute: 0 x10E3/uL (ref 0.0–0.2)
Basos: 1 %
EOS (ABSOLUTE): 0.2 x10E3/uL (ref 0.0–0.4)
Eos: 3 %
Hematocrit: 39.7 % (ref 34.0–46.6)
Hemoglobin: 12.7 g/dL (ref 11.1–15.9)
Immature Grans (Abs): 0 x10E3/uL (ref 0.0–0.1)
Immature Granulocytes: 0 %
Lymphocytes Absolute: 1.5 x10E3/uL (ref 0.7–3.1)
Lymphs: 25 %
MCH: 28.9 pg (ref 26.6–33.0)
MCHC: 32 g/dL (ref 31.5–35.7)
MCV: 90 fL (ref 79–97)
Monocytes Absolute: 0.6 x10E3/uL (ref 0.1–0.9)
Monocytes: 10 %
Neutrophils Absolute: 3.7 x10E3/uL (ref 1.4–7.0)
Neutrophils: 61 %
Platelets: 185 x10E3/uL (ref 150–450)
RBC: 4.4 x10E6/uL (ref 3.77–5.28)
RDW: 12.1 % (ref 11.7–15.4)
WBC: 6 x10E3/uL (ref 3.4–10.8)

## 2023-10-24 LAB — COMPREHENSIVE METABOLIC PANEL WITH GFR
ALT: 27 IU/L (ref 0–32)
AST: 36 IU/L (ref 0–40)
Albumin: 3.5 g/dL — ABNORMAL LOW (ref 3.8–4.9)
Alkaline Phosphatase: 110 IU/L (ref 44–121)
BUN/Creatinine Ratio: 16 (ref 9–23)
BUN: 18 mg/dL (ref 6–24)
Bilirubin Total: 0.7 mg/dL (ref 0.0–1.2)
CO2: 22 mmol/L (ref 20–29)
Calcium: 8.9 mg/dL (ref 8.7–10.2)
Chloride: 99 mmol/L (ref 96–106)
Creatinine, Ser: 1.15 mg/dL — ABNORMAL HIGH (ref 0.57–1.00)
Globulin, Total: 4.2 g/dL (ref 1.5–4.5)
Glucose: 93 mg/dL (ref 70–99)
Potassium: 3.7 mmol/L (ref 3.5–5.2)
Sodium: 137 mmol/L (ref 134–144)
Total Protein: 7.7 g/dL (ref 6.0–8.5)
eGFR: 55 mL/min/1.73 — ABNORMAL LOW (ref >59)

## 2023-10-24 LAB — THYROID PANEL WITH TSH
Free Thyroxine Index: 0.9 — ABNORMAL LOW (ref 1.2–4.9)
T3 Uptake Ratio: 28 % (ref 24–39)
T4, Total: 3.3 ug/dL — ABNORMAL LOW (ref 4.5–12.0)
TSH: 13.1 u[IU]/mL — ABNORMAL HIGH (ref 0.450–4.500)

## 2023-10-24 LAB — LIPID PANEL
Chol/HDL Ratio: 2.5 ratio (ref 0.0–4.4)
Cholesterol, Total: 178 mg/dL (ref 100–199)
HDL: 70 mg/dL (ref >39)
LDL Chol Calc (NIH): 94 mg/dL (ref 0–99)
Triglycerides: 73 mg/dL (ref 0–149)
VLDL Cholesterol Cal: 14 mg/dL (ref 5–40)

## 2023-10-24 NOTE — Progress Notes (Signed)
 Let pt know thyroid  level very low, needs to resume thyroid  medication rx was sent yesterday  all other labs normal stable

## 2023-11-07 ENCOUNTER — Ambulatory Visit: Attending: Critical Care Medicine | Admitting: Physical Therapy

## 2023-11-09 ENCOUNTER — Encounter (HOSPITAL_COMMUNITY): Payer: Self-pay

## 2023-11-09 ENCOUNTER — Emergency Department (HOSPITAL_COMMUNITY)

## 2023-11-09 ENCOUNTER — Other Ambulatory Visit: Payer: Self-pay

## 2023-11-09 ENCOUNTER — Emergency Department (HOSPITAL_COMMUNITY)
Admission: EM | Admit: 2023-11-09 | Discharge: 2023-11-09 | Disposition: A | Discharge status: Home / Self Care | Attending: Emergency Medicine | Admitting: Emergency Medicine

## 2023-11-09 DIAGNOSIS — Z7982 Long term (current) use of aspirin: Secondary | ICD-10-CM | POA: Diagnosis not present

## 2023-11-09 DIAGNOSIS — J069 Acute upper respiratory infection, unspecified: Secondary | ICD-10-CM | POA: Diagnosis not present

## 2023-11-09 DIAGNOSIS — E039 Hypothyroidism, unspecified: Secondary | ICD-10-CM | POA: Diagnosis not present

## 2023-11-09 DIAGNOSIS — R059 Cough, unspecified: Secondary | ICD-10-CM | POA: Diagnosis present

## 2023-11-09 DIAGNOSIS — Z79899 Other long term (current) drug therapy: Secondary | ICD-10-CM | POA: Diagnosis not present

## 2023-11-09 DIAGNOSIS — I10 Essential (primary) hypertension: Secondary | ICD-10-CM | POA: Insufficient documentation

## 2023-11-09 LAB — RESP PANEL BY RT-PCR (RSV, FLU A&B, COVID)  RVPGX2
Influenza A by PCR: NEGATIVE
Influenza B by PCR: NEGATIVE
Resp Syncytial Virus by PCR: NEGATIVE
SARS Coronavirus 2 by RT PCR: NEGATIVE

## 2023-11-09 NOTE — ED Provider Notes (Signed)
 [place] EMERGENCY DEPARTMENT AT [facility] Provider Note   CSN: [id number] Arrival date & time: 11/09/23  1352     Patient presents with: Cough and Nasal Congestion   Ann Jarvis is a 58 y.o. female.   Cough Patient is a benign-year-old female to the ED today for concerns for cough x 1 week with green mucus and sinus congestion.  Noted to not have not be complaining of any other symptoms at this time. Patient with history of HTN, hypothyroidism, cocaine  abuse.  Patient states that she does not over the last time she is used.  Uncertain of sick contacts.  Reported to finished course of antibiotics for sinus infection 2 weeks ago.  Denies fever, headache, vertigo, vision changes, body aches, chills, chest pain, shortness of breath, abdominal pain, nausea, vomiting, diarrhea, dysuria, lower leg swelling.      Prior to Admission medications   Medication Sig Start Date End Date Taking? Authorizing Provider  acetaminophen  (TYLENOL ) 500 MG tablet Take 1,000 mg by mouth 3 (three) times daily.    [provider]  amLODipine  (NORVASC ) 10 MG tablet Take 1 tablet (10 mg total) by mouth daily. *must keep upcoming appt* 10/23/23   Brien Belvie BRAVO, MD  aspirin  EC 81 MG tablet Take 1 tablet (81 mg total) by mouth daily. Swallow whole. 10/23/23   Brien Belvie BRAVO, MD  atorvastatin  (LIPITOR) 40 MG tablet Take 1 tablet (40 mg total) by mouth daily. 10/23/23   Brien Belvie BRAVO, MD  carvedilol  (COREG ) 6.25 MG tablet Take 1 tablet (6.25 mg total) by mouth 2 (two) times daily. 10/23/23   Brien Belvie BRAVO, MD  fluticasone  (FLONASE ) 50 MCG/ACT nasal spray Place 2 sprays into both nostrils daily. 10/23/23   Brien Belvie BRAVO, MD  hydrochlorothiazide  (HYDRODIURIL ) 25 MG tablet Take 1 tablet (25 mg total) by mouth daily. 10/23/23   Brien Belvie BRAVO, MD  isosorbide  mononitrate (IMDUR ) 30 MG 24 hr tablet Take 1 tablet (30 mg total) by mouth daily. 10/23/23   Brien Belvie BRAVO, MD   levothyroxine  (SYNTHROID ) 200 MCG tablet Take 1 tablet (200 mcg total) by mouth daily before breakfast. 10/23/23   Brien Belvie BRAVO, MD  loratadine  (CLARITIN ) 10 MG tablet Take 1 tablet (10 mg total) by mouth daily. 10/23/23   Brien Belvie BRAVO, MD  losartan  (COZAAR ) 50 MG tablet Take 1 tablet (50 mg total) by mouth daily. 10/23/23   Brien Belvie BRAVO, MD    Allergies: Ciprofloxacin , Flagyl  [metronidazole ], and Penicillins    Review of Systems  HENT:  Positive for congestion.   Respiratory:  Positive for cough.   All other systems reviewed and are negative.   Updated Vital Signs BP (!) 192/105   Pulse 64   Temp 98.2 F (36.8 C)   Resp 14   LMP 10/07/2015   SpO2 97%   Physical Exam Vitals and nursing note reviewed.  Constitutional:      General: She is not in acute distress.    Appearance: Normal appearance. She is not ill-appearing or diaphoretic.  HENT:     Head: Normocephalic and atraumatic.     Nose: Congestion and rhinorrhea present.     Mouth/Throat:     Mouth: Mucous membranes are moist.     Pharynx: Oropharynx is clear. No oropharyngeal exudate or posterior oropharyngeal erythema.  Eyes:     General: No scleral icterus.       Right eye: No discharge.        Left eye:  No discharge.     Extraocular Movements: Extraocular movements intact.     Conjunctiva/sclera: Conjunctivae normal.  Cardiovascular:     Rate and Rhythm: Normal rate and regular rhythm.     Pulses: Normal pulses.     Heart sounds: Normal heart sounds. No murmur heard.    No friction rub. No gallop.  Pulmonary:     Effort: Pulmonary effort is normal. No respiratory distress.     Breath sounds: No stridor. No wheezing, rhonchi or rales.  Chest:     Chest wall: No tenderness.  Abdominal:     General: Abdomen is flat. There is no distension.     Palpations: Abdomen is soft.     Tenderness: There is no abdominal tenderness. There is no right CVA tenderness, left CVA tenderness, guarding or rebound.   Musculoskeletal:        General: No swelling, deformity or signs of injury.     Cervical back: Normal range of motion. No rigidity or tenderness.     Right lower leg: No edema.     Left lower leg: No edema.  Skin:    General: Skin is warm and dry.     Findings: No bruising, erythema or lesion.  Neurological:     General: No focal deficit present.     Mental Status: She is alert and oriented to person, place, and time. Mental status is at baseline.     Sensory: No sensory deficit.     Motor: No weakness.  Psychiatric:        Mood and Affect: Mood normal.     (all labs ordered are listed, but only abnormal results are displayed) Labs Reviewed  RESP PANEL BY RT-PCR (RSV, FLU A&B, COVID)  [id number]    EKG: None  Radiology: DG Chest 2 View Result Date: 11/09/2023 CLINICAL DATA:  Congestion, productive cough, and chest soreness. EXAM: CHEST - 2 VIEW COMPARISON:  10/06/2022. FINDINGS: The heart size and mediastinal contours are within normal limits. No consolidation, effusion, or pneumothorax is seen. No acute osseous abnormality. IMPRESSION: No active cardiopulmonary disease. Electronically Signed   By: Leita Birmingham M.D.   On: 11/09/2023 14:38   Procedures   Medications Ordered in the ED - No data to display                               Medical Decision Making Amount and/or Complexity of Data Reviewed Radiology: ordered.   This patient is a 58 year old female who presents to the ED for concern of cough x 1 week with green mucus, asymptomatic otherwise with no reported fever, shortness of breath, chest pain, headache, vision changes, nausea, vomiting or any other infectious signs.  On physical exam, patient is in no acute distress, afebrile, alert and orient x 4, speaking in full sentences, nontachypneic, nontachycardic.  LCTAB, RRR, no murmur, no abdominal pain, unremarkable exam otherwise.  With patient's currently not experiencing any other symptoms outside of cough and  congestion with green mucus production, chest x-ray was done and showed to be negative and respiratory panel was also negative.  Patient blood pressure was elevated however had not been taking her blood pressure medication for today, patient opted to continue to go home to take blood pressure medication.  Provide strict turn to ER precautions.  Will have her continue to do symptomatic management at home and follow-up with PCP as needed for any persistent symptoms and return to the  ED for new or worsening symptoms.   Differential diagnoses prior to evaluation: The emergent differential diagnosis includes, but is not limited to, URI, bronchitis, pneumonia, sinusitis, pleural effusions, ACS. This is not an exhaustive differential.   Past Medical History / Co-morbidities / Social History: HTN, cocaine  abuse, Graves' disease, scabies  Additional history: Chart reviewed. Pertinent results include:   Last seen by pulmonary on 10/23/2023.  Lab Tests/Imaging studies: I personally interpreted labs/imaging and the pertinent results include:   Chest x-ray unremarkable Respiratory panel unremarkable   I agree with the radiologist interpretation.  Medications:  I have reviewed the patients home medicines and have made adjustments as needed.  Critical Interventions: None  Social Determinants of Health:.  History of cocaine  abuse  Disposition: After consideration of the diagnostic results and the patients response to treatment, I feel that the patient would benefit from discharge and treatment as above.   emergency department workup does not suggest an emergent condition requiring admission or immediate intervention beyond what has been performed at this time. The plan is: Follow-up with PCP as needed for any persistent symptoms, return to ED for new or worsening symptoms, symptomatic management at home. The patient is safe for discharge and has been instructed to return immediately for worsening  symptoms, change in symptoms or any other concerns.    Final diagnoses:  Viral upper respiratory tract infection    ED Discharge Orders     None          Beola Terrall GORMAN DEVONNA 11/09/23 1920    Patt Alm Macho, MD 11/09/23 2157

## 2023-11-09 NOTE — ED Triage Notes (Signed)
 Pt c.o 1 week of cough and chest congestion, coughing up green mucous, taking OTC medications without relief

## 2023-11-09 NOTE — Discharge Instructions (Addendum)
 You were seen today for an upper respiratory infection likely due to a virus.  With you having minimal symptoms at this time.  Will recommend that you continue to take Tylenol  for any aches that you may experience in the future.  Also recommend that you continue to use Nettie pot sinus rinse to help keep congestion clear.  As well also recommend that you continue to stay hydrated.  Recommend continue to follow-up with your PCP for persistent symptoms.  And if symptoms persist longer than 5 days, follow-up with PCP as you may need antibiotics at that time.  If you begin to develop any worsening fever, shortness of breath, chest pain, blood in stool or urine, please return to the ED for further evaluation.

## 2023-11-25 ENCOUNTER — Telehealth: Payer: Self-pay | Admitting: Internal Medicine

## 2023-11-25 NOTE — Telephone Encounter (Signed)
 Called patient to confirm upcoming appointment 11/27/2023. Patient appointment has been successfully confirmed

## 2023-11-27 ENCOUNTER — Ambulatory Visit: Payer: MEDICAID | Admitting: Pharmacist

## 2023-12-10 ENCOUNTER — Ambulatory Visit: Payer: Self-pay

## 2023-12-10 ENCOUNTER — Emergency Department (HOSPITAL_COMMUNITY): Payer: Medicare (Managed Care)

## 2023-12-10 ENCOUNTER — Other Ambulatory Visit: Payer: Self-pay

## 2023-12-10 ENCOUNTER — Encounter (HOSPITAL_COMMUNITY): Payer: Self-pay | Admitting: Emergency Medicine

## 2023-12-10 ENCOUNTER — Emergency Department (HOSPITAL_COMMUNITY)
Admission: EM | Admit: 2023-12-10 | Discharge: 2023-12-10 | Disposition: A | Discharge status: Home / Self Care | Payer: Medicare (Managed Care) | Attending: Emergency Medicine | Admitting: Emergency Medicine

## 2023-12-10 DIAGNOSIS — S39012A Strain of muscle, fascia and tendon of lower back, initial encounter: Secondary | ICD-10-CM | POA: Insufficient documentation

## 2023-12-10 DIAGNOSIS — S161XXA Strain of muscle, fascia and tendon at neck level, initial encounter: Secondary | ICD-10-CM | POA: Diagnosis not present

## 2023-12-10 DIAGNOSIS — Z79899 Other long term (current) drug therapy: Secondary | ICD-10-CM | POA: Diagnosis present

## 2023-12-10 DIAGNOSIS — Z7982 Long term (current) use of aspirin: Secondary | ICD-10-CM | POA: Insufficient documentation

## 2023-12-10 DIAGNOSIS — Y92410 Unspecified street and highway as the place of occurrence of the external cause: Secondary | ICD-10-CM | POA: Diagnosis not present

## 2023-12-10 MED ORDER — LIDOCAINE 5 % EX PTCH
1.0000 | MEDICATED_PATCH | CUTANEOUS | 0 refills | Status: DC
  Filled 2023-12-10: qty 14, 14d supply, fill #0

## 2023-12-10 MED ORDER — METHOCARBAMOL 500 MG PO TABS
500.0000 mg | ORAL_TABLET | Freq: Two times a day (BID) | ORAL | 0 refills | Status: DC
  Filled 2023-12-10 (×2): qty 20, 10d supply, fill #0
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
## 2023-12-10 NOTE — Discharge Instructions (Signed)
 You have muscle strains in your neck and low back.  I have sent a muscle relaxer into the pharmacy.  Do not drive or do anything else dangerous after taking this medicine as it will make you drowsy.  Take ibuprofen  600 mg 3 times a day.  You can also take 1000 mg of Tylenol  every 6 hours.  I have also sent in lidocaine  patch.  Return for any emergent symptoms.  Otherwise follow-up with your primary care doctor.

## 2023-12-10 NOTE — Telephone Encounter (Signed)
 Patient was called and she states that she has been informed of results.
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
## 2023-12-24 ENCOUNTER — Telehealth: Payer: Self-pay | Admitting: Internal Medicine

## 2023-12-24 NOTE — Telephone Encounter (Signed)
 Confirmed appt for 9/25

## 2023-12-25 ENCOUNTER — Emergency Department (HOSPITAL_COMMUNITY)
Admission: EM | Admit: 2023-12-25 | Discharge: 2023-12-26 | Discharge status: Against Medical Advice | Payer: Medicare (Managed Care) | Attending: Emergency Medicine | Admitting: Emergency Medicine

## 2023-12-25 ENCOUNTER — Emergency Department (HOSPITAL_COMMUNITY): Payer: Medicare (Managed Care)

## 2023-12-25 ENCOUNTER — Encounter (HOSPITAL_COMMUNITY): Payer: Self-pay

## 2023-12-25 ENCOUNTER — Other Ambulatory Visit: Payer: Self-pay

## 2023-12-25 ENCOUNTER — Ambulatory Visit: Payer: Medicare (Managed Care) | Admitting: Internal Medicine

## 2023-12-25 DIAGNOSIS — M25511 Pain in right shoulder: Secondary | ICD-10-CM | POA: Insufficient documentation

## 2023-12-25 DIAGNOSIS — Z8673 Personal history of transient ischemic attack (TIA), and cerebral infarction without residual deficits: Secondary | ICD-10-CM | POA: Insufficient documentation

## 2023-12-25 DIAGNOSIS — R202 Paresthesia of skin: Secondary | ICD-10-CM | POA: Insufficient documentation

## 2023-12-25 DIAGNOSIS — Z5321 Procedure and treatment not carried out due to patient leaving prior to being seen by health care provider: Secondary | ICD-10-CM | POA: Insufficient documentation

## 2023-12-25 DIAGNOSIS — R0602 Shortness of breath: Secondary | ICD-10-CM | POA: Diagnosis not present

## 2023-12-25 DIAGNOSIS — R200 Anesthesia of skin: Secondary | ICD-10-CM | POA: Insufficient documentation

## 2023-12-25 NOTE — ED Notes (Signed)
 Pt called 3x, and received no answer for vitals.

## 2023-12-25 NOTE — Telephone Encounter (Signed)
 Copied from CRM [id number]. Topic: Appointments - Appointment Cancel/Reschedule >> Dec 25, 2023 10:22 AM Rea ORN wrote:  Pt called to advise that her [employer] transpiration did not arrive to bring her to her 10:30 appt. Pt stated she was in the ED and needed to see PCP to follow up. The soonest available first appointment is October and the pt asked if there is any what she can be worked in this week. Please call back to advise.

## 2023-12-25 NOTE — ED Triage Notes (Signed)
 Pt arrives ambulatory due to right sided arm pain and numbness which she woke up with yesterday.  She states that she has a tightness in her axilla and some arm pain and numbness. Pt also reports mild sob since yesterday.  Pt states that her speech is at baseline
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
## 2023-12-25 NOTE — Telephone Encounter (Signed)
 Appointment scheduled for 10/28.

## 2023-12-25 NOTE — Progress Notes (Deleted)
 x
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
## 2024-01-07 NOTE — Progress Notes (Signed)
 S:     No chief complaint on file.  58 y.o. female who presents for hypertension evaluation, education, and management. PMH is significant for HTN, hypothyroidism, CVA, polysubstance abuse, GERD, chronic knee pain.   Patient was referred and last seen by Primary Care Provider, Dr. Vicci, on 10/23/23. Patient saw Dr. Brien during that visit. At last visit, patient had not been taking BP medications in over a month. She was referred to pharmacy, but did not end up seeing pharmacy. Since then she has been in and out of the ED  Today, patient arrives in spirits and presents without  assistance. She states she is doing well. Denies dizziness, headache, blurred vision, swelling. She does report missing a few days of medication during the week. She is motivated to start eating better, start exercising more, and to get on track with her blood pressure.  Family/Social history:  Current smoker, 1 pack a week Alcohol: weekend drinker; 3-4 cocktails - recommend reducing to 1-2  Medication adherence seems optimal. Patient reports taking blood pressure medications today.   Current antihypertensives include: isosorbide  30 mg daily, hydrochlorothiazide  25 mg daily, carvedilol  6.25 mg twice daily, amlodipine  10 mg daily, losartan  50 mg  Reported home blood pressure readings: no BP cuff at home, but would like a monitor.  Patient reported dietary habits: big appetite Breakfast: sausage biscuit from [employer] Lunch: taco salad  Dinner: hamburger, eats lot of veggies Snacks: Chips Ahoy, chips Drinks: 1 cup of coffee a day, water, juice, soda  Patient-reported exercise habits: walking everyday for about 10 minutes  ASCVD risk factors include: smoking, previous stroke  O:  Vitals:   01/08/24 1138  BP: (!) 177/90  Pulse: 60   Last 3 Office BP readings: BP Readings from Last 3 Encounters:  01/08/24 (!) 177/90  12/25/23 (!) 176/95  12/10/23 (!) 174/96   BMET    Component Value Date/Time    NA 137 10/23/2023 1208   K 3.7 10/23/2023 1208   CL 99 10/23/2023 1208   CO2 22 10/23/2023 1208   GLUCOSE 93 10/23/2023 1208   GLUCOSE 108 (H) 10/06/2022 1055   BUN 18 10/23/2023 1208   CREATININE 1.15 (H) 10/23/2023 1208   CREATININE 1.11 (H) 05/07/2016 1409   CALCIUM  8.9 10/23/2023 1208   GFRNONAA >60 10/06/2022 1055   GFRNONAA 57 (L) 05/07/2016 1409   GFRAA >60 10/28/2019 1950   GFRAA 66 05/07/2016 1409   Patient is participating in a Managed Medicaid Plan:  Yes   A/P: Hypertension diagnosed currently uncontrolled on current medications. Office BP of 177/90 mmHg. BP goal < 130/80 mmHg. Medication adherence appears to be okay. Medication adherence is  on going issue, but  seems to be improving. Combination medications are available with the medications she currently takes. Patient is amendable to switching to a combination pill to reduce her pill burden. She thinks that this will help with her adherence. She is going to try to improve on taking her medications every day. Can consider adding spironolactone at next pharmacy visit if BP does not show improvement with combination. -Started olmesartan-amlodipine -hydrochlorothiazide  40-10-25 mg daily -Discontinue losartan  50 mg, amlodipine  10 mg, and hydrochlorothiazide  25 mg -Prescription sent to [facility] for a BP cuff. Start checking BP at home twice a day - once in the morning and once in the evening. Write BP down to bring to next appointment.   -F/u labs with PCP or at next pharmacy appointment on 02/16/24 -Counseled on lifestyle modifications for blood pressure control  including reduced dietary sodium, increased exercise, adequate sleep. Patient is very amendable to getting her lifestyle together. She wants to work on diet an exercise. She stated to me I will be back in 1 month to see you and you can see all of the change I've made. I am hopeful of her willingness to change! -Encouraged patient to check BP at home and bring log of  readings to next visit. Counseled on proper use of home BP cuff.   Results reviewed and written information provided.    Written patient instructions provided. Patient verbalized understanding of treatment plan.  Total time in face to face counseling 30 minutes.    Follow-up:  Pharmacist 02/16/24. PCP clinic visit on 01/27/24 with Dr. Vicci.   Jenkins Graces, PharmD PGY1 Pharmacy Resident [phone]

## 2024-01-08 ENCOUNTER — Ambulatory Visit: Payer: Medicare (Managed Care) | Attending: Family Medicine | Admitting: Pharmacist

## 2024-01-08 ENCOUNTER — Encounter: Payer: Self-pay | Admitting: Pharmacist

## 2024-01-08 ENCOUNTER — Other Ambulatory Visit: Payer: Self-pay

## 2024-01-08 VITALS — BP 177/90 | HR 60

## 2024-01-08 DIAGNOSIS — I10 Essential (primary) hypertension: Secondary | ICD-10-CM | POA: Diagnosis not present

## 2024-01-08 MED ORDER — BLOOD PRESSURE CUFF MISC: 0 refills | Status: AC

## 2024-01-08 MED ORDER — METHOCARBAMOL 500 MG PO TABS
500.0000 mg | ORAL_TABLET | Freq: Two times a day (BID) | ORAL | 0 refills | Status: AC
  Filled 2024-01-08 (×2): qty 20, 10d supply, fill #0

## 2024-01-08 MED ORDER — OLMESARTAN-AMLODIPINE-HCTZ 40-10-25 MG PO TABS
1.0000 | ORAL_TABLET | Freq: Every day | ORAL | 6 refills | Status: DC
  Filled 2024-01-08: qty 30, 30d supply, fill #0
  Filled 2024-01-08: qty 30, fill #0

## 2024-01-08 MED ORDER — LIDOCAINE 5 % EX PTCH
1.0000 | MEDICATED_PATCH | CUTANEOUS | 0 refills | Status: AC
  Filled 2024-01-08: qty 14, 14d supply, fill #0

## 2024-01-09 ENCOUNTER — Other Ambulatory Visit: Payer: Self-pay

## 2024-01-12 ENCOUNTER — Other Ambulatory Visit: Payer: Self-pay

## 2024-01-27 ENCOUNTER — Ambulatory Visit: Payer: Medicare (Managed Care) | Admitting: Internal Medicine

## 2024-02-02 ENCOUNTER — Encounter: Payer: Self-pay | Admitting: Radiology

## 2024-02-15 NOTE — Progress Notes (Unsigned)
 S:     No chief complaint on file.  58 y.o. female who presents for hypertension evaluation, education, and management. PMH is significant for HTN, hypothyroidism, CVA, polysubstance abuse, GERD, chronic knee pain.   Patient was referred and last seen by Primary Care Provider, Dr. Brien, on 10/23/23. At that time, patient had not been taking BP medications for over a month.   Patient saw pharmacy on 01/08/24. BP was elevated in clinic with reports of not consistently taking medications. Due to non-adherence, patient was started on olmesartan-amlodipine -hydrochlorothiazide  as a combination tablet (Tribenzor). Recommended patient purchase a BP monitor to check home BP and bring readings into clinic. Patient stated she would start lifestyle changes. Patient no showed PCP appointment on 01/27/24 with Dr. Vicci.  Today, patient arrives in spirits and presents without  assistance. She states she is doing well. Denies dizziness, headache, blurred vision, swelling. She has not been able to pick up a BP cuff for home monitoring. She started her the combination BP medication after her last visit with pharmacy, but has not been taking anything else. She requests her medications be filled today.  Family/Social history:  Current smoker, 1 pack a week - states that she would be willing to quit Alcohol: weekend drinker; 3-4 cocktails - recommend reducing to 1-2  Medication adherence seems optimal. Patient reports taking blood pressure medications today.   Current antihypertensives include: -olmesartan/amlodipine /hydrochlorothiazide  40/10/25 mg once daily -isosorbide  30 mg daily (has not been taking) -carvedilol  6.25 mg twice daily (has not been taking)  Reported home blood pressure readings: N/A  Patient reported dietary habits: big appetite Breakfast: sausage biscuit from [employer] Lunch: taco salad  Dinner: hamburger, eats lot of veggies Snacks: Chips Ahoy, chips Drinks: 1 cup of coffee a day,  water, juice, soda  Patient-reported exercise habits: walking everyday for about 15 minutes 3x a day  ASCVD risk factors include: smoking, previous stroke  O:  Vitals:   02/16/24 1154  BP: 137/85  Pulse: 61   Last 3 Office BP readings: BP Readings from Last 3 Encounters:  02/16/24 137/85  01/08/24 (!) 177/90  12/25/23 (!) 176/95   BMET    Component Value Date/Time   NA 137 10/23/2023 1208   K 3.7 10/23/2023 1208   CL 99 10/23/2023 1208   CO2 22 10/23/2023 1208   GLUCOSE 93 10/23/2023 1208   GLUCOSE 108 (H) 10/06/2022 1055   BUN 18 10/23/2023 1208   CREATININE 1.15 (H) 10/23/2023 1208   CREATININE 1.11 (H) 05/07/2016 1409   CALCIUM  8.9 10/23/2023 1208   GFRNONAA >60 10/06/2022 1055   GFRNONAA 57 (L) 05/07/2016 1409   GFRAA >60 10/28/2019 1950   GFRAA 66 05/07/2016 1409   Patient is participating in a Managed Medicaid Plan:  Yes   A/P: Hypertension currently uncontrolled. BP goal < 130/80 mmHg. Today, clinic BP reading of 137/85 mmHg! Commended patient for her hard work. Discussed the benefits of the combination medication which has showed great improvement in her BP. BP reading does not include carvedilol  and isosorbide  mononitrate. Discussed medications with patient and the importance of taking all of them. Reviewed using a pill box to help with adherence -Continue olmesartan-amlodipine -hydrochlorothiazide  40-10-25 mg daily and isosorbide  30 mg daily -HOLD carvedilol  6.25 mg BID. With BP almost to goal and risk of bradycardia, recommended not taking for now. Will consider re-adding at next visit if BP is still above goal. -Continue checking BP at home. Write BP down to bring to next appointment.   -F/u  labs with PCP or at next pharmacy appointment -Counseled on lifestyle modifications for blood pressure control including reduced dietary sodium, increased exercise, adequate sleep.  -Encouraged patient to check BP at home and bring log of readings to next visit. Counseled  on proper use of home BP cuff.   Results reviewed and written information provided.    Written patient instructions provided. Patient verbalized understanding of treatment plan.  Total time in face to face counseling 30 minutes.    Follow-up:  Pharmacist 03/18/24 PCP clinic visit not scheduled   Jenkins Graces, PharmD PGY1 Pharmacy Resident [phone]

## 2024-02-16 ENCOUNTER — Other Ambulatory Visit (HOSPITAL_COMMUNITY): Payer: Self-pay

## 2024-02-16 ENCOUNTER — Other Ambulatory Visit: Payer: Self-pay

## 2024-02-16 ENCOUNTER — Encounter: Payer: Self-pay | Admitting: Pharmacist

## 2024-02-16 ENCOUNTER — Ambulatory Visit: Payer: Medicare (Managed Care) | Attending: Family Medicine | Admitting: Pharmacist

## 2024-02-16 VITALS — BP 137/85 | HR 61

## 2024-02-16 DIAGNOSIS — I10 Essential (primary) hypertension: Secondary | ICD-10-CM

## 2024-02-16 MED ORDER — OLMESARTAN-AMLODIPINE-HCTZ 40-10-25 MG PO TABS
1.0000 | ORAL_TABLET | Freq: Every day | ORAL | 1 refills | Status: AC
  Filled 2024-02-16 (×2): qty 90, 90d supply, fill #0

## 2024-03-18 ENCOUNTER — Ambulatory Visit: Payer: Medicare (Managed Care) | Admitting: Pharmacist
